# Patient Record
Sex: Male | Born: 1935 | Race: White | Hispanic: No | Marital: Married | Smoking: Current every day smoker
Health system: Southern US, Community
[De-identification: ages and names within clinical notes are randomized; demographics above are authoritative.]

## PROBLEM LIST (undated history)

## (undated) DIAGNOSIS — Z7901 Long term (current) use of anticoagulants: Secondary | ICD-10-CM

## (undated) DIAGNOSIS — E785 Hyperlipidemia, unspecified: Secondary | ICD-10-CM

## (undated) DIAGNOSIS — I10 Essential (primary) hypertension: Secondary | ICD-10-CM

## (undated) DIAGNOSIS — I340 Nonrheumatic mitral (valve) insufficiency: Secondary | ICD-10-CM

## (undated) DIAGNOSIS — F1010 Alcohol abuse, uncomplicated: Secondary | ICD-10-CM

## (undated) DIAGNOSIS — E291 Testicular hypofunction: Secondary | ICD-10-CM

## (undated) DIAGNOSIS — E119 Type 2 diabetes mellitus without complications: Secondary | ICD-10-CM

## (undated) DIAGNOSIS — S42401A Unspecified fracture of lower end of right humerus, initial encounter for closed fracture: Secondary | ICD-10-CM

## (undated) DIAGNOSIS — G459 Transient cerebral ischemic attack, unspecified: Secondary | ICD-10-CM

## (undated) DIAGNOSIS — I428 Other cardiomyopathies: Secondary | ICD-10-CM

## (undated) DIAGNOSIS — Z87891 Personal history of nicotine dependence: Secondary | ICD-10-CM

## (undated) DIAGNOSIS — I2510 Atherosclerotic heart disease of native coronary artery without angina pectoris: Secondary | ICD-10-CM

## (undated) DIAGNOSIS — Z8719 Personal history of other diseases of the digestive system: Secondary | ICD-10-CM

## (undated) DIAGNOSIS — I5020 Unspecified systolic (congestive) heart failure: Secondary | ICD-10-CM

## (undated) DIAGNOSIS — Z86010 Personal history of colonic polyps: Secondary | ICD-10-CM

## (undated) DIAGNOSIS — I4821 Permanent atrial fibrillation: Secondary | ICD-10-CM

## (undated) DIAGNOSIS — M25561 Pain in right knee: Secondary | ICD-10-CM

## (undated) HISTORY — DX: Essential (primary) hypertension: I10

## (undated) HISTORY — DX: Other cardiomyopathies: I42.8

## (undated) HISTORY — DX: Transient cerebral ischemic attack, unspecified: G45.9

## (undated) HISTORY — DX: Alcohol abuse, uncomplicated: F10.10

## (undated) HISTORY — DX: Long term (current) use of anticoagulants: Z79.01

## (undated) HISTORY — DX: Personal history of other diseases of the digestive system: Z87.19

## (undated) HISTORY — DX: Personal history of nicotine dependence: Z87.891

## (undated) HISTORY — PX: TONSILLECTOMY: SUR1361

## (undated) HISTORY — DX: Pain in right knee: M25.561

## (undated) HISTORY — DX: Unspecified systolic (congestive) heart failure: I50.20

## (undated) HISTORY — DX: Atherosclerotic heart disease of native coronary artery without angina pectoris: I25.10

## (undated) HISTORY — DX: Permanent atrial fibrillation: I48.21

## (undated) HISTORY — DX: Unspecified fracture of lower end of right humerus, initial encounter for closed fracture: S42.401A

## (undated) HISTORY — DX: Personal history of colonic polyps: Z86.010

## (undated) HISTORY — DX: Nonrheumatic mitral (valve) insufficiency: I34.0

## (undated) HISTORY — DX: Hyperlipidemia, unspecified: E78.5

## (undated) HISTORY — DX: Testicular hypofunction: E29.1

## (undated) HISTORY — DX: Type 2 diabetes mellitus without complications: E11.9

## (undated) HISTORY — PX: APPENDECTOMY: SHX54

---

## 1992-09-21 DIAGNOSIS — Z8639 Personal history of other endocrine, nutritional and metabolic disease: Secondary | ICD-10-CM | POA: Insufficient documentation

## 1992-09-21 DIAGNOSIS — E119 Type 2 diabetes mellitus without complications: Secondary | ICD-10-CM

## 1992-09-21 HISTORY — DX: Type 2 diabetes mellitus without complications: E11.9

## 1993-09-21 HISTORY — PX: CARDIAC CATHETERIZATION: SHX172

## 2001-05-22 DIAGNOSIS — E785 Hyperlipidemia, unspecified: Secondary | ICD-10-CM | POA: Insufficient documentation

## 2001-05-22 HISTORY — DX: Hyperlipidemia, unspecified: E78.5

## 2002-06-21 ENCOUNTER — Encounter: Payer: Self-pay | Admitting: Family Medicine

## 2002-06-21 LAB — CONVERTED CEMR LAB
Microalbumin U total vol: 6.2 mg/L
PSA: 0.36 ng/mL

## 2002-09-21 ENCOUNTER — Encounter: Payer: Self-pay | Admitting: Family Medicine

## 2002-09-21 LAB — CONVERTED CEMR LAB: Hgb A1c MFr Bld: 7.2 %

## 2002-10-26 DIAGNOSIS — Z860100 Personal history of colon polyps, unspecified: Secondary | ICD-10-CM

## 2002-10-26 HISTORY — DX: Personal history of colon polyps, unspecified: Z86.0100

## 2002-10-26 HISTORY — DX: Personal history of colonic polyps: Z86.010

## 2003-01-20 DIAGNOSIS — I482 Chronic atrial fibrillation, unspecified: Secondary | ICD-10-CM | POA: Insufficient documentation

## 2003-01-20 DIAGNOSIS — I4821 Permanent atrial fibrillation: Secondary | ICD-10-CM

## 2003-01-20 HISTORY — DX: Permanent atrial fibrillation: I48.21

## 2003-02-20 ENCOUNTER — Encounter: Payer: Self-pay | Admitting: Family Medicine

## 2003-02-20 LAB — CONVERTED CEMR LAB
Hgb A1c MFr Bld: 7.1 %
Microalbumin U total vol: 45.4 mg/L

## 2003-04-22 DIAGNOSIS — M109 Gout, unspecified: Secondary | ICD-10-CM | POA: Insufficient documentation

## 2003-05-23 ENCOUNTER — Encounter: Payer: Self-pay | Admitting: Family Medicine

## 2003-05-23 LAB — CONVERTED CEMR LAB: Hgb A1c MFr Bld: 5.9 %

## 2003-10-23 ENCOUNTER — Encounter: Payer: Self-pay | Admitting: Family Medicine

## 2003-10-23 LAB — CONVERTED CEMR LAB: Hgb A1c MFr Bld: 5.5 %

## 2004-02-20 ENCOUNTER — Encounter: Payer: Self-pay | Admitting: Family Medicine

## 2004-02-20 HISTORY — PX: BUNIONECTOMY: SHX129

## 2004-02-20 LAB — CONVERTED CEMR LAB
Hgb A1c MFr Bld: 5.6 %
Microalbumin U total vol: 52.8 mg/L
PSA: 0.4 ng/mL

## 2004-07-22 ENCOUNTER — Encounter: Payer: Self-pay | Admitting: Family Medicine

## 2004-07-22 LAB — CONVERTED CEMR LAB: Hgb A1c MFr Bld: 6 %

## 2004-07-25 ENCOUNTER — Ambulatory Visit: Payer: Self-pay | Admitting: Family Medicine

## 2004-08-04 ENCOUNTER — Ambulatory Visit: Payer: Self-pay | Admitting: Family Medicine

## 2004-08-05 ENCOUNTER — Ambulatory Visit: Payer: Self-pay | Admitting: Family Medicine

## 2004-08-25 ENCOUNTER — Ambulatory Visit: Payer: Self-pay | Admitting: Family Medicine

## 2004-09-11 ENCOUNTER — Ambulatory Visit: Payer: Self-pay | Admitting: Family Medicine

## 2004-10-09 ENCOUNTER — Ambulatory Visit: Payer: Self-pay | Admitting: Family Medicine

## 2004-11-06 ENCOUNTER — Ambulatory Visit: Payer: Self-pay | Admitting: Family Medicine

## 2004-11-13 ENCOUNTER — Ambulatory Visit: Payer: Self-pay | Admitting: Family Medicine

## 2004-11-27 ENCOUNTER — Ambulatory Visit: Payer: Self-pay | Admitting: Family Medicine

## 2004-12-08 ENCOUNTER — Ambulatory Visit: Payer: Self-pay | Admitting: Family Medicine

## 2004-12-25 ENCOUNTER — Ambulatory Visit: Payer: Self-pay | Admitting: Family Medicine

## 2005-01-22 ENCOUNTER — Ambulatory Visit: Payer: Self-pay | Admitting: Family Medicine

## 2005-02-19 ENCOUNTER — Ambulatory Visit: Payer: Self-pay | Admitting: Family Medicine

## 2005-02-19 LAB — CONVERTED CEMR LAB: Hgb A1c MFr Bld: 5.5 %

## 2005-03-06 ENCOUNTER — Ambulatory Visit: Payer: Self-pay | Admitting: Family Medicine

## 2005-03-10 ENCOUNTER — Ambulatory Visit: Payer: Self-pay | Admitting: Family Medicine

## 2005-04-06 ENCOUNTER — Ambulatory Visit: Payer: Self-pay | Admitting: Family Medicine

## 2005-04-23 ENCOUNTER — Ambulatory Visit: Payer: Self-pay | Admitting: Family Medicine

## 2005-05-06 ENCOUNTER — Ambulatory Visit: Payer: Self-pay | Admitting: Family Medicine

## 2005-05-19 ENCOUNTER — Ambulatory Visit: Payer: Self-pay | Admitting: Family Medicine

## 2005-05-22 ENCOUNTER — Encounter: Payer: Self-pay | Admitting: Family Medicine

## 2005-05-22 LAB — CONVERTED CEMR LAB
Hgb A1c MFr Bld: 6 %
Microalbumin U total vol: 28.8 mg/L
PSA: 0.66 ng/mL

## 2005-05-26 ENCOUNTER — Ambulatory Visit: Payer: Self-pay | Admitting: Family Medicine

## 2005-05-29 ENCOUNTER — Ambulatory Visit: Payer: Self-pay | Admitting: Family Medicine

## 2005-06-08 ENCOUNTER — Ambulatory Visit: Payer: Self-pay | Admitting: Family Medicine

## 2005-06-15 ENCOUNTER — Ambulatory Visit: Payer: Self-pay | Admitting: Family Medicine

## 2005-06-17 ENCOUNTER — Ambulatory Visit: Payer: Self-pay | Admitting: Family Medicine

## 2005-06-29 ENCOUNTER — Ambulatory Visit: Payer: Self-pay | Admitting: Family Medicine

## 2005-07-07 ENCOUNTER — Ambulatory Visit: Payer: Self-pay | Admitting: Family Medicine

## 2005-07-13 ENCOUNTER — Ambulatory Visit: Payer: Self-pay | Admitting: Family Medicine

## 2005-07-27 ENCOUNTER — Ambulatory Visit: Payer: Self-pay | Admitting: Family Medicine

## 2005-08-04 ENCOUNTER — Ambulatory Visit: Payer: Self-pay | Admitting: Family Medicine

## 2005-08-18 ENCOUNTER — Ambulatory Visit: Payer: Self-pay | Admitting: Family Medicine

## 2005-09-16 ENCOUNTER — Ambulatory Visit: Payer: Self-pay | Admitting: Family Medicine

## 2005-10-14 ENCOUNTER — Ambulatory Visit: Payer: Self-pay | Admitting: Family Medicine

## 2005-11-11 ENCOUNTER — Ambulatory Visit: Payer: Self-pay | Admitting: Family Medicine

## 2005-12-09 ENCOUNTER — Ambulatory Visit: Payer: Self-pay | Admitting: Family Medicine

## 2005-12-15 ENCOUNTER — Ambulatory Visit: Payer: Self-pay | Admitting: Family Medicine

## 2005-12-23 ENCOUNTER — Ambulatory Visit: Payer: Self-pay | Admitting: Family Medicine

## 2006-01-13 ENCOUNTER — Ambulatory Visit: Payer: Self-pay | Admitting: Family Medicine

## 2006-01-29 ENCOUNTER — Ambulatory Visit: Payer: Self-pay | Admitting: Family Medicine

## 2006-02-11 ENCOUNTER — Ambulatory Visit: Payer: Self-pay | Admitting: Family Medicine

## 2006-03-03 ENCOUNTER — Ambulatory Visit: Payer: Self-pay | Admitting: Family Medicine

## 2006-03-17 ENCOUNTER — Ambulatory Visit: Payer: Self-pay | Admitting: Family Medicine

## 2006-04-06 ENCOUNTER — Ambulatory Visit: Payer: Self-pay | Admitting: Family Medicine

## 2006-04-19 ENCOUNTER — Ambulatory Visit: Payer: Self-pay | Admitting: Family Medicine

## 2006-05-17 ENCOUNTER — Ambulatory Visit: Payer: Self-pay | Admitting: Family Medicine

## 2006-06-14 ENCOUNTER — Ambulatory Visit: Payer: Self-pay | Admitting: Family Medicine

## 2006-06-21 ENCOUNTER — Encounter: Payer: Self-pay | Admitting: Family Medicine

## 2006-06-21 LAB — CONVERTED CEMR LAB
Microalbumin U total vol: 83.3 mg/L
PSA: 0.55 ng/mL

## 2006-07-05 ENCOUNTER — Ambulatory Visit: Payer: Self-pay | Admitting: Family Medicine

## 2006-07-06 ENCOUNTER — Ambulatory Visit: Payer: Self-pay | Admitting: Family Medicine

## 2006-07-13 ENCOUNTER — Ambulatory Visit: Payer: Self-pay | Admitting: Family Medicine

## 2006-08-16 ENCOUNTER — Ambulatory Visit: Payer: Self-pay | Admitting: Family Medicine

## 2006-08-17 ENCOUNTER — Ambulatory Visit: Payer: Self-pay | Admitting: Family Medicine

## 2006-08-23 ENCOUNTER — Ambulatory Visit: Payer: Self-pay | Admitting: Family Medicine

## 2006-09-10 ENCOUNTER — Ambulatory Visit: Payer: Self-pay | Admitting: Family Medicine

## 2006-09-21 ENCOUNTER — Encounter: Payer: Self-pay | Admitting: Family Medicine

## 2006-09-21 LAB — CONVERTED CEMR LAB: Hgb A1c MFr Bld: 6.5 %

## 2006-10-06 ENCOUNTER — Ambulatory Visit: Payer: Self-pay | Admitting: Family Medicine

## 2006-10-06 LAB — CONVERTED CEMR LAB
Hgb A1c MFr Bld: 6.5 % — ABNORMAL HIGH (ref 4.6–6.0)
Potassium: 5.1 meq/L (ref 3.5–5.1)

## 2006-10-18 ENCOUNTER — Ambulatory Visit: Payer: Self-pay | Admitting: Family Medicine

## 2006-11-15 ENCOUNTER — Ambulatory Visit: Payer: Self-pay | Admitting: Family Medicine

## 2006-12-13 ENCOUNTER — Ambulatory Visit: Payer: Self-pay | Admitting: Family Medicine

## 2007-01-10 ENCOUNTER — Ambulatory Visit: Payer: Self-pay | Admitting: Family Medicine

## 2007-01-10 LAB — CONVERTED CEMR LAB
ALT: 17 units/L (ref 0–40)
AST: 20 units/L (ref 0–37)
Albumin: 3.5 g/dL (ref 3.5–5.2)
Alkaline Phosphatase: 31 units/L — ABNORMAL LOW (ref 39–117)
BUN: 18 mg/dL (ref 6–23)
Basophils Absolute: 0.1 10*3/uL (ref 0.0–0.1)
Basophils Relative: 2.3 % — ABNORMAL HIGH (ref 0.0–1.0)
Bilirubin, Direct: 0.1 mg/dL (ref 0.0–0.3)
CO2: 27 meq/L (ref 19–32)
Calcium: 8.8 mg/dL (ref 8.4–10.5)
Chloride: 109 meq/L (ref 96–112)
Cholesterol: 97 mg/dL (ref 0–200)
Creatinine, Ser: 1.2 mg/dL (ref 0.4–1.5)
Creatinine,U: 305.8 mg/dL
Eosinophils Absolute: 0.1 10*3/uL (ref 0.0–0.6)
Eosinophils Relative: 2.7 % (ref 0.0–5.0)
GFR calc Af Amer: 77 mL/min
GFR calc non Af Amer: 64 mL/min
Glucose, Bld: 117 mg/dL — ABNORMAL HIGH (ref 70–99)
HCT: 37.8 % — ABNORMAL LOW (ref 39.0–52.0)
HDL: 39.5 mg/dL (ref >39.0)
Hemoglobin: 12.9 g/dL — ABNORMAL LOW (ref 13.0–17.0)
Hgb A1c MFr Bld: 6.2 % — ABNORMAL HIGH (ref 4.6–6.0)
LDL Cholesterol: 42 mg/dL (ref 0–99)
Lymphocytes Relative: 23.7 % (ref 12.0–46.0)
MCHC: 34.1 g/dL (ref 30.0–36.0)
MCV: 91.1 fL (ref 78.0–100.0)
Microalb Creat Ratio: 49.4 mg/g — ABNORMAL HIGH (ref 0.0–30.0)
Microalb, Ur: 15.1 mg/dL — ABNORMAL HIGH (ref 0.0–1.9)
Monocytes Absolute: 0.4 10*3/uL (ref 0.2–0.7)
Monocytes Relative: 9.1 % (ref 3.0–11.0)
Neutro Abs: 2.8 10*3/uL (ref 1.4–7.7)
Neutrophils Relative %: 62.2 % (ref 43.0–77.0)
PSA: 0.54 ng/mL (ref 0.10–4.00)
Platelets: 207 10*3/uL (ref 150–400)
Potassium: 5.6 meq/L — ABNORMAL HIGH (ref 3.5–5.1)
RBC: 4.14 M/uL — ABNORMAL LOW (ref 4.22–5.81)
RDW: 15.5 % — ABNORMAL HIGH (ref 11.5–14.6)
Sodium: 143 meq/L (ref 135–145)
TSH: 2.19 microintl units/mL (ref 0.35–5.50)
Total Bilirubin: 0.7 mg/dL (ref 0.3–1.2)
Total CHOL/HDL Ratio: 2.5
Total Protein: 5.8 g/dL — ABNORMAL LOW (ref 6.0–8.3)
Triglycerides: 78 mg/dL (ref 0–149)
Uric Acid, Serum: 9.1 mg/dL — ABNORMAL HIGH (ref 2.4–7.0)
VLDL: 16 mg/dL (ref 0–40)
WBC: 4.5 10*3/uL (ref 4.5–10.5)

## 2007-01-11 ENCOUNTER — Encounter: Payer: Self-pay | Admitting: Family Medicine

## 2007-01-11 DIAGNOSIS — I471 Supraventricular tachycardia, unspecified: Secondary | ICD-10-CM | POA: Insufficient documentation

## 2007-01-12 ENCOUNTER — Ambulatory Visit: Payer: Self-pay | Admitting: Family Medicine

## 2007-02-07 ENCOUNTER — Ambulatory Visit: Payer: Self-pay | Admitting: Family Medicine

## 2007-02-07 LAB — CONVERTED CEMR LAB
INR: 2.2
Prothrombin Time: 18.3 s

## 2007-03-07 ENCOUNTER — Ambulatory Visit: Payer: Self-pay | Admitting: Family Medicine

## 2007-03-07 LAB — CONVERTED CEMR LAB
INR: 2.7
Prothrombin Time: 20.1 s

## 2007-04-04 ENCOUNTER — Ambulatory Visit: Payer: Self-pay | Admitting: Family Medicine

## 2007-04-04 LAB — CONVERTED CEMR LAB
INR: 4.1
Prothrombin Time: 24.4 s

## 2007-04-18 ENCOUNTER — Ambulatory Visit: Payer: Self-pay | Admitting: Family Medicine

## 2007-04-18 LAB — CONVERTED CEMR LAB
INR: 2.5
Prothrombin Time: 19.2 s

## 2007-05-16 ENCOUNTER — Ambulatory Visit: Payer: Self-pay | Admitting: Family Medicine

## 2007-05-16 LAB — CONVERTED CEMR LAB
INR: 2.7
Prothrombin Time: 19.9 s

## 2007-06-13 ENCOUNTER — Encounter (INDEPENDENT_AMBULATORY_CARE_PROVIDER_SITE_OTHER): Payer: Self-pay | Admitting: *Deleted

## 2007-06-13 ENCOUNTER — Ambulatory Visit: Payer: Self-pay | Admitting: Family Medicine

## 2007-06-13 LAB — CONVERTED CEMR LAB
INR: 1.3
Prothrombin Time: 14.1 s

## 2007-06-22 ENCOUNTER — Ambulatory Visit: Payer: Self-pay | Admitting: Family Medicine

## 2007-06-23 ENCOUNTER — Ambulatory Visit: Payer: Self-pay | Admitting: Family Medicine

## 2007-06-23 LAB — CONVERTED CEMR LAB
INR: 1.7
Prothrombin Time: 16.1 s

## 2007-07-11 ENCOUNTER — Ambulatory Visit: Payer: Self-pay | Admitting: Family Medicine

## 2007-07-11 LAB — CONVERTED CEMR LAB
ALT: 17 units/L (ref 0–53)
AST: 22 units/L (ref 0–37)
Albumin: 3.6 g/dL (ref 3.5–5.2)
Alkaline Phosphatase: 34 units/L — ABNORMAL LOW (ref 39–117)
BUN: 22 mg/dL (ref 6–23)
Basophils Absolute: 0 10*3/uL (ref 0.0–0.1)
Basophils Relative: 0.6 % (ref 0.0–1.0)
Bilirubin, Direct: 0.1 mg/dL (ref 0.0–0.3)
CO2: 30 meq/L (ref 19–32)
Calcium: 9.1 mg/dL (ref 8.4–10.5)
Chloride: 109 meq/L (ref 96–112)
Cholesterol: 94 mg/dL (ref 0–200)
Creatinine, Ser: 1.3 mg/dL (ref 0.4–1.5)
Creatinine,U: 141.9 mg/dL
Eosinophils Absolute: 0.1 10*3/uL (ref 0.0–0.6)
Eosinophils Relative: 1.7 % (ref 0.0–5.0)
GFR calc Af Amer: 70 mL/min
GFR calc non Af Amer: 58 mL/min
Glucose, Bld: 108 mg/dL — ABNORMAL HIGH (ref 70–99)
HCT: 38.5 % — ABNORMAL LOW (ref 39.0–52.0)
HDL: 39.2 mg/dL (ref >39.0)
Hemoglobin: 12.9 g/dL — ABNORMAL LOW (ref 13.0–17.0)
Hgb A1c MFr Bld: 6.1 % — ABNORMAL HIGH (ref 4.6–6.0)
INR: 1.7
LDL Cholesterol: 44 mg/dL (ref 0–99)
Lymphocytes Relative: 27.2 % (ref 12.0–46.0)
MCHC: 33.6 g/dL (ref 30.0–36.0)
MCV: 88.2 fL (ref 78.0–100.0)
Microalb Creat Ratio: 121.9 mg/g — ABNORMAL HIGH (ref 0.0–30.0)
Microalb, Ur: 17.3 mg/dL — ABNORMAL HIGH (ref 0.0–1.9)
Monocytes Absolute: 0.4 10*3/uL (ref 0.2–0.7)
Monocytes Relative: 10.3 % (ref 3.0–11.0)
Neutro Abs: 2.4 10*3/uL (ref 1.4–7.7)
Neutrophils Relative %: 60.2 % (ref 43.0–77.0)
Platelets: 239 10*3/uL (ref 150–400)
Potassium: 5.3 meq/L — ABNORMAL HIGH (ref 3.5–5.1)
Prothrombin Time: 16.1 s
RBC: 4.37 M/uL (ref 4.22–5.81)
RDW: 15.6 % — ABNORMAL HIGH (ref 11.5–14.6)
Sodium: 143 meq/L (ref 135–145)
TSH: 1.77 microintl units/mL (ref 0.35–5.50)
Total Bilirubin: 0.7 mg/dL (ref 0.3–1.2)
Total CHOL/HDL Ratio: 2.4
Total Protein: 6.1 g/dL (ref 6.0–8.3)
Triglycerides: 55 mg/dL (ref 0–149)
Uric Acid, Serum: 8.2 mg/dL — ABNORMAL HIGH (ref 2.4–7.0)
VLDL: 11 mg/dL (ref 0–40)
WBC: 4 10*3/uL — ABNORMAL LOW (ref 4.5–10.5)

## 2007-07-14 ENCOUNTER — Ambulatory Visit: Payer: Self-pay | Admitting: Family Medicine

## 2007-08-05 ENCOUNTER — Ambulatory Visit: Payer: Self-pay | Admitting: Family Medicine

## 2007-08-09 ENCOUNTER — Ambulatory Visit: Payer: Self-pay | Admitting: Family Medicine

## 2007-08-09 LAB — CONVERTED CEMR LAB
INR: 1.9
Prothrombin Time: 16.8 s

## 2007-09-06 ENCOUNTER — Ambulatory Visit: Payer: Self-pay | Admitting: Family Medicine

## 2007-09-06 LAB — CONVERTED CEMR LAB
INR: 3.9
Prothrombin Time: 23.9 s

## 2007-09-20 ENCOUNTER — Ambulatory Visit: Payer: Self-pay | Admitting: Family Medicine

## 2007-09-20 LAB — CONVERTED CEMR LAB
INR: 1
Prothrombin Time: 12.6 s

## 2007-10-05 ENCOUNTER — Ambulatory Visit: Payer: Self-pay | Admitting: Family Medicine

## 2007-10-05 LAB — CONVERTED CEMR LAB
INR: 1.5
INR: 1.5 — ABNORMAL HIGH (ref 0.8–1.0)
Prothrombin Time: 14.8 s
Prothrombin Time: 14.8 s — ABNORMAL HIGH (ref 10.9–13.3)

## 2007-10-20 ENCOUNTER — Ambulatory Visit: Payer: Self-pay | Admitting: Family Medicine

## 2007-10-20 LAB — CONVERTED CEMR LAB
INR: 4.2
Prothrombin Time: 24.8 s

## 2007-11-03 ENCOUNTER — Ambulatory Visit: Payer: Self-pay | Admitting: Family Medicine

## 2007-11-03 LAB — CONVERTED CEMR LAB
INR: 2.4
Prothrombin Time: 19 s

## 2007-11-11 ENCOUNTER — Ambulatory Visit: Payer: Self-pay | Admitting: Ophthalmology

## 2007-11-16 ENCOUNTER — Ambulatory Visit: Payer: Self-pay | Admitting: Family Medicine

## 2007-11-16 LAB — CONVERTED CEMR LAB
INR: 2.6
Prothrombin Time: 19.5 s

## 2007-11-24 ENCOUNTER — Telehealth: Payer: Self-pay | Admitting: Family Medicine

## 2007-11-25 ENCOUNTER — Encounter: Payer: Self-pay | Admitting: Family Medicine

## 2007-12-05 ENCOUNTER — Telehealth: Payer: Self-pay | Admitting: Family Medicine

## 2007-12-06 ENCOUNTER — Encounter: Payer: Self-pay | Admitting: Family Medicine

## 2007-12-06 ENCOUNTER — Ambulatory Visit: Payer: Self-pay | Admitting: Unknown Physician Specialty

## 2007-12-06 LAB — HM COLONOSCOPY

## 2007-12-07 ENCOUNTER — Telehealth: Payer: Self-pay | Admitting: Family Medicine

## 2007-12-12 ENCOUNTER — Encounter: Payer: Self-pay | Admitting: Family Medicine

## 2007-12-13 ENCOUNTER — Ambulatory Visit: Payer: Self-pay | Admitting: Family Medicine

## 2007-12-14 LAB — CONVERTED CEMR LAB
INR: 2 — ABNORMAL HIGH (ref 0.8–1.0)
Prothrombin Time: 17.4 s — ABNORMAL HIGH (ref 10.9–13.3)

## 2007-12-28 ENCOUNTER — Ambulatory Visit: Payer: Self-pay | Admitting: Family Medicine

## 2007-12-28 LAB — CONVERTED CEMR LAB
INR: 2.6
Prothrombin Time: 19.5 s

## 2008-01-05 ENCOUNTER — Ambulatory Visit: Payer: Self-pay | Admitting: Family Medicine

## 2008-01-05 LAB — CONVERTED CEMR LAB: Hgb A1c MFr Bld: 6.2 % — ABNORMAL HIGH (ref 4.6–6.0)

## 2008-01-09 ENCOUNTER — Ambulatory Visit: Payer: Self-pay | Admitting: Family Medicine

## 2008-01-09 DIAGNOSIS — H04129 Dry eye syndrome of unspecified lacrimal gland: Secondary | ICD-10-CM | POA: Insufficient documentation

## 2008-01-25 ENCOUNTER — Ambulatory Visit: Payer: Self-pay | Admitting: Family Medicine

## 2008-01-25 LAB — CONVERTED CEMR LAB
INR: 2.1
Prothrombin Time: 17.7 s

## 2008-02-22 ENCOUNTER — Ambulatory Visit: Payer: Self-pay | Admitting: Family Medicine

## 2008-02-22 LAB — CONVERTED CEMR LAB
BUN: 13 mg/dL (ref 6–23)
CO2: 30 meq/L (ref 19–32)
Calcium: 8.4 mg/dL (ref 8.4–10.5)
Chloride: 110 meq/L (ref 96–112)
Creatinine, Ser: 1.2 mg/dL (ref 0.4–1.5)
GFR calc Af Amer: 77 mL/min
GFR calc non Af Amer: 64 mL/min
Glucose, Bld: 133 mg/dL — ABNORMAL HIGH (ref 70–99)
Hgb A1c MFr Bld: 6.4 % — ABNORMAL HIGH (ref 4.6–6.0)
INR: 1.5
Potassium: 4.6 meq/L (ref 3.5–5.1)
Prothrombin Time: 15.2 s
Sodium: 135 meq/L (ref 135–145)

## 2008-02-27 ENCOUNTER — Ambulatory Visit: Payer: Self-pay | Admitting: Family Medicine

## 2008-03-07 ENCOUNTER — Ambulatory Visit: Payer: Self-pay | Admitting: Family Medicine

## 2008-03-07 LAB — CONVERTED CEMR LAB
INR: 1.8
Prothrombin Time: 16.4 s

## 2008-04-04 ENCOUNTER — Ambulatory Visit: Payer: Self-pay | Admitting: Family Medicine

## 2008-04-04 LAB — CONVERTED CEMR LAB
INR: 2.5
Prothrombin Time: 19.2 s

## 2008-05-07 ENCOUNTER — Ambulatory Visit: Payer: Self-pay | Admitting: Family Medicine

## 2008-05-07 LAB — CONVERTED CEMR LAB
Hgb A1c MFr Bld: 6.9 % — ABNORMAL HIGH (ref 4.6–6.0)
INR: 3.2
Prothrombin Time: 21.5 s

## 2008-05-10 ENCOUNTER — Ambulatory Visit: Payer: Self-pay | Admitting: Family Medicine

## 2008-05-21 ENCOUNTER — Ambulatory Visit: Payer: Self-pay | Admitting: Family Medicine

## 2008-05-21 LAB — CONVERTED CEMR LAB
INR: 1.8
Prothrombin Time: 16.6 s

## 2008-05-30 ENCOUNTER — Encounter: Payer: Self-pay | Admitting: Family Medicine

## 2008-06-04 ENCOUNTER — Ambulatory Visit: Payer: Self-pay | Admitting: Family Medicine

## 2008-06-04 LAB — CONVERTED CEMR LAB
INR: 1.5
Prothrombin Time: 14.9 s

## 2008-06-06 ENCOUNTER — Ambulatory Visit: Payer: Self-pay | Admitting: Family Medicine

## 2008-06-18 ENCOUNTER — Ambulatory Visit: Payer: Self-pay | Admitting: Family Medicine

## 2008-06-18 LAB — CONVERTED CEMR LAB
INR: 1.9
Prothrombin Time: 17.1 s

## 2008-06-20 ENCOUNTER — Ambulatory Visit: Payer: Self-pay | Admitting: Family Medicine

## 2008-07-05 ENCOUNTER — Ambulatory Visit: Payer: Self-pay | Admitting: Family Medicine

## 2008-07-05 LAB — CONVERTED CEMR LAB
ALT: 27 units/L (ref 0–53)
AST: 26 units/L (ref 0–37)
Albumin: 3.5 g/dL (ref 3.5–5.2)
Alkaline Phosphatase: 43 units/L (ref 39–117)
BUN: 24 mg/dL — ABNORMAL HIGH (ref 6–23)
Basophils Absolute: 0 10*3/uL (ref 0.0–0.1)
Basophils Relative: 0.8 % (ref 0.0–3.0)
Bilirubin, Direct: 0.2 mg/dL (ref 0.0–0.3)
CO2: 28 meq/L (ref 19–32)
Calcium: 9 mg/dL (ref 8.4–10.5)
Chloride: 108 meq/L (ref 96–112)
Cholesterol: 89 mg/dL (ref 0–200)
Creatinine, Ser: 1.3 mg/dL (ref 0.4–1.5)
Creatinine,U: 175.5 mg/dL
Eosinophils Absolute: 0.1 10*3/uL (ref 0.0–0.7)
Eosinophils Relative: 2 % (ref 0.0–5.0)
GFR calc Af Amer: 70 mL/min
GFR calc non Af Amer: 58 mL/min
Glucose, Bld: 100 mg/dL — ABNORMAL HIGH (ref 70–99)
HCT: 38.2 % — ABNORMAL LOW (ref 39.0–52.0)
HDL: 32 mg/dL — ABNORMAL LOW (ref >39.0)
Hemoglobin: 12.8 g/dL — ABNORMAL LOW (ref 13.0–17.0)
Hgb A1c MFr Bld: 6.5 % — ABNORMAL HIGH (ref 4.6–6.0)
INR: 2.3 — ABNORMAL HIGH (ref 0.8–1.0)
LDL Cholesterol: 42 mg/dL (ref 0–99)
Lymphocytes Relative: 36.2 % (ref 12.0–46.0)
MCHC: 33.5 g/dL (ref 30.0–36.0)
MCV: 86.9 fL (ref 78.0–100.0)
Microalb Creat Ratio: 62.1 mg/g — ABNORMAL HIGH (ref 0.0–30.0)
Microalb, Ur: 10.9 mg/dL — ABNORMAL HIGH (ref 0.0–1.9)
Monocytes Absolute: 0.3 10*3/uL (ref 0.1–1.0)
Monocytes Relative: 8.5 % (ref 3.0–12.0)
Neutro Abs: 2 10*3/uL (ref 1.4–7.7)
Neutrophils Relative %: 52.5 % (ref 43.0–77.0)
PSA: 0.33 ng/mL (ref 0.10–4.00)
Platelets: 187 10*3/uL (ref 150–400)
Potassium: 4.9 meq/L (ref 3.5–5.1)
Prothrombin Time: 25.1 s — ABNORMAL HIGH (ref 10.9–13.3)
RBC: 4.4 M/uL (ref 4.22–5.81)
RDW: 16.9 % — ABNORMAL HIGH (ref 11.5–14.6)
Sodium: 141 meq/L (ref 135–145)
TSH: 1.86 microintl units/mL (ref 0.35–5.50)
Total Bilirubin: 0.8 mg/dL (ref 0.3–1.2)
Total CHOL/HDL Ratio: 2.8
Total Protein: 6 g/dL (ref 6.0–8.3)
Triglycerides: 74 mg/dL (ref 0–149)
Uric Acid, Serum: 9.8 mg/dL — ABNORMAL HIGH (ref 4.0–7.8)
VLDL: 15 mg/dL (ref 0–40)
WBC: 3.7 10*3/uL — ABNORMAL LOW (ref 4.5–10.5)

## 2008-07-11 ENCOUNTER — Encounter (INDEPENDENT_AMBULATORY_CARE_PROVIDER_SITE_OTHER): Payer: Self-pay | Admitting: *Deleted

## 2008-07-11 ENCOUNTER — Ambulatory Visit: Payer: Self-pay | Admitting: Family Medicine

## 2008-07-27 ENCOUNTER — Ambulatory Visit: Payer: Self-pay | Admitting: Family Medicine

## 2008-07-27 LAB — CONVERTED CEMR LAB
OCCULT 1: NEGATIVE
OCCULT 2: NEGATIVE
OCCULT 3: NEGATIVE

## 2008-07-27 LAB — FECAL OCCULT BLOOD, GUAIAC: Fecal Occult Blood: NEGATIVE

## 2008-07-30 ENCOUNTER — Encounter (INDEPENDENT_AMBULATORY_CARE_PROVIDER_SITE_OTHER): Payer: Self-pay | Admitting: *Deleted

## 2008-08-13 ENCOUNTER — Ambulatory Visit: Payer: Self-pay | Admitting: Family Medicine

## 2008-08-13 LAB — CONVERTED CEMR LAB
ALT: 35 units/L (ref 0–53)
AST: 32 units/L (ref 0–37)
BUN: 21 mg/dL (ref 6–23)
CO2: 26 meq/L (ref 19–32)
Calcium: 9.1 mg/dL (ref 8.4–10.5)
Chloride: 107 meq/L (ref 96–112)
Creatinine, Ser: 1.4 mg/dL (ref 0.4–1.5)
GFR calc Af Amer: 64 mL/min
GFR calc non Af Amer: 53 mL/min
Glucose, Bld: 112 mg/dL — ABNORMAL HIGH (ref 70–99)
INR: 1.6
Potassium: 5.1 meq/L (ref 3.5–5.1)
Prothrombin Time: 15.4 s
Sodium: 139 meq/L (ref 135–145)

## 2008-09-07 ENCOUNTER — Ambulatory Visit: Payer: Self-pay | Admitting: Family Medicine

## 2008-09-07 LAB — CONVERTED CEMR LAB
ALT: 27 units/L (ref 0–53)
AST: 25 units/L (ref 0–37)
BUN: 24 mg/dL — ABNORMAL HIGH (ref 6–23)
CO2: 27 meq/L (ref 19–32)
Calcium: 9 mg/dL (ref 8.4–10.5)
Chloride: 107 meq/L (ref 96–112)
Creatinine, Ser: 1.3 mg/dL (ref 0.4–1.5)
GFR calc Af Amer: 70 mL/min
GFR calc non Af Amer: 58 mL/min
Glucose, Bld: 125 mg/dL — ABNORMAL HIGH (ref 70–99)
INR: 1.2
Potassium: 4.5 meq/L (ref 3.5–5.1)
Prothrombin Time: 13.8 s
Sodium: 139 meq/L (ref 135–145)
Uric Acid, Serum: 6 mg/dL (ref 4.0–7.8)

## 2008-09-19 ENCOUNTER — Ambulatory Visit: Payer: Self-pay | Admitting: Family Medicine

## 2008-09-19 LAB — CONVERTED CEMR LAB
INR: 2
Prothrombin Time: 17.3 s

## 2008-09-24 ENCOUNTER — Telehealth: Payer: Self-pay | Admitting: Family Medicine

## 2008-10-03 ENCOUNTER — Ambulatory Visit: Payer: Self-pay | Admitting: Family Medicine

## 2008-10-03 LAB — CONVERTED CEMR LAB
INR: 3.6
Prothrombin Time: 23 s

## 2008-10-31 ENCOUNTER — Ambulatory Visit: Payer: Self-pay | Admitting: Family Medicine

## 2008-10-31 LAB — CONVERTED CEMR LAB
INR: 1.8
Prothrombin Time: 16.4 s

## 2008-11-28 ENCOUNTER — Ambulatory Visit: Payer: Self-pay | Admitting: Family Medicine

## 2008-11-28 LAB — CONVERTED CEMR LAB
INR: 3.8
Prothrombin Time: 23.4 s

## 2008-11-29 ENCOUNTER — Ambulatory Visit: Payer: Self-pay | Admitting: Family Medicine

## 2009-01-04 ENCOUNTER — Ambulatory Visit: Payer: Self-pay | Admitting: Family Medicine

## 2009-01-04 LAB — CONVERTED CEMR LAB
ALT: 81 units/L — ABNORMAL HIGH (ref 0–53)
AST: 62 units/L — ABNORMAL HIGH (ref 0–37)
BUN: 30 mg/dL — ABNORMAL HIGH (ref 6–23)
CO2: 26 meq/L (ref 19–32)
Calcium: 9.3 mg/dL (ref 8.4–10.5)
Chloride: 108 meq/L (ref 96–112)
Creatinine, Ser: 1.8 mg/dL — ABNORMAL HIGH (ref 0.4–1.5)
GFR calc non Af Amer: 39.76 mL/min (ref >60)
Glucose, Bld: 132 mg/dL — ABNORMAL HIGH (ref 70–99)
Hgb A1c MFr Bld: 5.9 % (ref 4.6–6.5)
INR: 1.9
Potassium: 5.6 meq/L — ABNORMAL HIGH (ref 3.5–5.1)
Prothrombin Time: 16.8 s
Sodium: 140 meq/L (ref 135–145)
Uric Acid, Serum: 5.1 mg/dL (ref 4.0–7.8)

## 2009-01-17 ENCOUNTER — Ambulatory Visit: Payer: Self-pay | Admitting: Family Medicine

## 2009-02-04 ENCOUNTER — Ambulatory Visit: Payer: Self-pay | Admitting: Family Medicine

## 2009-02-04 LAB — CONVERTED CEMR LAB
INR: 2
Prothrombin Time: 17.5 s

## 2009-02-21 ENCOUNTER — Ambulatory Visit: Payer: Self-pay | Admitting: Family Medicine

## 2009-02-21 LAB — CONVERTED CEMR LAB
ALT: 23 units/L (ref 0–53)
AST: 27 units/L (ref 0–37)
BUN: 32 mg/dL — ABNORMAL HIGH (ref 6–23)
CO2: 27 meq/L (ref 19–32)
Calcium: 8.7 mg/dL (ref 8.4–10.5)
Chloride: 107 meq/L (ref 96–112)
Creatinine, Ser: 1.8 mg/dL — ABNORMAL HIGH (ref 0.4–1.5)
GFR calc non Af Amer: 39.74 mL/min (ref >60)
Glucose, Bld: 91 mg/dL (ref 70–99)
Potassium: 5.1 meq/L (ref 3.5–5.1)
Sodium: 139 meq/L (ref 135–145)
Uric Acid, Serum: 4.8 mg/dL (ref 4.0–7.8)

## 2009-02-28 ENCOUNTER — Ambulatory Visit: Payer: Self-pay | Admitting: Family Medicine

## 2009-02-28 DIAGNOSIS — M79609 Pain in unspecified limb: Secondary | ICD-10-CM | POA: Insufficient documentation

## 2009-02-28 LAB — CONVERTED CEMR LAB
INR: 2
Prothrombin Time: 17.3 s

## 2009-03-05 LAB — HM DIABETES EYE EXAM: HM Diabetic Eye Exam: NORMAL

## 2009-03-28 ENCOUNTER — Ambulatory Visit: Payer: Self-pay | Admitting: Family Medicine

## 2009-03-28 LAB — CONVERTED CEMR LAB
INR: 2.2
Prothrombin Time: 18.2 s

## 2009-04-24 ENCOUNTER — Telehealth: Payer: Self-pay | Admitting: Family Medicine

## 2009-04-25 ENCOUNTER — Ambulatory Visit: Payer: Self-pay | Admitting: Family Medicine

## 2009-04-25 LAB — CONVERTED CEMR LAB
INR: 1.9
Prothrombin Time: 17 s

## 2009-05-10 ENCOUNTER — Ambulatory Visit: Payer: Self-pay | Admitting: Family Medicine

## 2009-05-10 LAB — CONVERTED CEMR LAB
INR: 1.5
Prothrombin Time: 14.9 s

## 2009-05-20 ENCOUNTER — Ambulatory Visit: Payer: Self-pay | Admitting: Family Medicine

## 2009-05-20 LAB — CONVERTED CEMR LAB
INR: 3.8
Prothrombin Time: 23.5 s

## 2009-06-04 ENCOUNTER — Ambulatory Visit: Payer: Self-pay | Admitting: Family Medicine

## 2009-06-04 LAB — CONVERTED CEMR LAB
INR: 1.7
Prothrombin Time: 15.9 s

## 2009-06-18 ENCOUNTER — Ambulatory Visit: Payer: Self-pay | Admitting: Family Medicine

## 2009-06-18 LAB — CONVERTED CEMR LAB
INR: 2.9
Prothrombin Time: 20.5 s

## 2009-06-21 ENCOUNTER — Ambulatory Visit: Payer: Self-pay | Admitting: Family Medicine

## 2009-06-23 LAB — CONVERTED CEMR LAB
ALT: 24 units/L (ref 0–53)
AST: 25 units/L (ref 0–37)
Albumin: 3.9 g/dL (ref 3.5–5.2)
Alkaline Phosphatase: 50 units/L (ref 39–117)
BUN: 25 mg/dL — ABNORMAL HIGH (ref 6–23)
Basophils Absolute: 0 10*3/uL (ref 0.0–0.1)
Basophils Relative: 0.4 % (ref 0.0–3.0)
Bilirubin, Direct: 0.1 mg/dL (ref 0.0–0.3)
CO2: 28 meq/L (ref 19–32)
Calcium: 9.1 mg/dL (ref 8.4–10.5)
Chloride: 110 meq/L (ref 96–112)
Cholesterol: 102 mg/dL (ref 0–200)
Creatinine, Ser: 1.4 mg/dL (ref 0.4–1.5)
Creatinine,U: 141 mg/dL
Eosinophils Absolute: 0.1 10*3/uL (ref 0.0–0.7)
Eosinophils Relative: 1.4 % (ref 0.0–5.0)
GFR calc non Af Amer: 53.07 mL/min (ref >60)
Glucose, Bld: 115 mg/dL — ABNORMAL HIGH (ref 70–99)
HCT: 40.5 % (ref 39.0–52.0)
HDL: 35.6 mg/dL — ABNORMAL LOW (ref >39.00)
Hemoglobin: 13.9 g/dL (ref 13.0–17.0)
Hgb A1c MFr Bld: 6.3 % (ref 4.6–6.5)
LDL Cholesterol: 47 mg/dL (ref 0–99)
Lymphocytes Relative: 23.8 % (ref 12.0–46.0)
Lymphs Abs: 1.1 10*3/uL (ref 0.7–4.0)
MCHC: 34.2 g/dL (ref 30.0–36.0)
MCV: 93.5 fL (ref 78.0–100.0)
Microalb Creat Ratio: 124.1 mg/g — ABNORMAL HIGH (ref 0.0–30.0)
Microalb, Ur: 17.5 mg/dL — ABNORMAL HIGH (ref 0.0–1.9)
Monocytes Absolute: 0.4 10*3/uL (ref 0.1–1.0)
Monocytes Relative: 7.7 % (ref 3.0–12.0)
Neutro Abs: 3.2 10*3/uL (ref 1.4–7.7)
Neutrophils Relative %: 66.7 % (ref 43.0–77.0)
PSA: 0.67 ng/mL (ref 0.10–4.00)
Platelets: 192 10*3/uL (ref 150.0–400.0)
Potassium: 4.7 meq/L (ref 3.5–5.1)
RBC: 4.33 M/uL (ref 4.22–5.81)
RDW: 16.7 % — ABNORMAL HIGH (ref 11.5–14.6)
Sodium: 141 meq/L (ref 135–145)
TSH: 1.52 microintl units/mL (ref 0.35–5.50)
Total Bilirubin: 0.8 mg/dL (ref 0.3–1.2)
Total CHOL/HDL Ratio: 3
Total Protein: 6.2 g/dL (ref 6.0–8.3)
Triglycerides: 97 mg/dL (ref 0.0–149.0)
Uric Acid, Serum: 5.2 mg/dL (ref 4.0–7.8)
VLDL: 19.4 mg/dL (ref 0.0–40.0)
Vit D, 25-Hydroxy: 46 ng/mL (ref 30–89)
WBC: 4.8 10*3/uL (ref 4.5–10.5)

## 2009-07-01 ENCOUNTER — Ambulatory Visit: Payer: Self-pay | Admitting: Family Medicine

## 2009-07-16 ENCOUNTER — Ambulatory Visit: Payer: Self-pay | Admitting: Family Medicine

## 2009-07-16 LAB — CONVERTED CEMR LAB
INR: 1.5
Prothrombin Time: 14.9 s

## 2009-07-17 ENCOUNTER — Ambulatory Visit: Payer: Self-pay | Admitting: Family Medicine

## 2009-07-24 ENCOUNTER — Ambulatory Visit: Payer: Self-pay | Admitting: Orthopedic Surgery

## 2009-07-31 ENCOUNTER — Ambulatory Visit: Payer: Self-pay | Admitting: Family Medicine

## 2009-07-31 LAB — CONVERTED CEMR LAB
INR: 2.1
Prothrombin Time: 17.9 s

## 2009-08-14 ENCOUNTER — Ambulatory Visit: Payer: Self-pay | Admitting: Family Medicine

## 2009-08-14 LAB — CONVERTED CEMR LAB

## 2009-09-02 ENCOUNTER — Encounter (INDEPENDENT_AMBULATORY_CARE_PROVIDER_SITE_OTHER): Payer: Self-pay | Admitting: *Deleted

## 2009-09-10 ENCOUNTER — Encounter: Payer: Self-pay | Admitting: Family Medicine

## 2009-09-10 ENCOUNTER — Telehealth: Payer: Self-pay | Admitting: Family Medicine

## 2009-09-11 ENCOUNTER — Ambulatory Visit: Payer: Self-pay | Admitting: Family Medicine

## 2009-09-11 LAB — CONVERTED CEMR LAB
INR: 2.7
Prothrombin Time: 19.9 s

## 2009-09-26 ENCOUNTER — Encounter: Payer: Self-pay | Admitting: Family Medicine

## 2009-10-10 ENCOUNTER — Ambulatory Visit: Payer: Self-pay | Admitting: Family Medicine

## 2009-10-10 LAB — CONVERTED CEMR LAB
INR: 3.3
Prothrombin Time: 21.8 s

## 2009-10-24 ENCOUNTER — Ambulatory Visit: Payer: Self-pay | Admitting: Family Medicine

## 2009-10-24 LAB — CONVERTED CEMR LAB
INR: 2.8
Prothrombin Time: 20.2 s

## 2009-11-21 ENCOUNTER — Ambulatory Visit: Payer: Self-pay | Admitting: Family Medicine

## 2009-11-21 LAB — CONVERTED CEMR LAB: INR: 3.6

## 2009-12-20 ENCOUNTER — Ambulatory Visit: Payer: Self-pay | Admitting: Family Medicine

## 2009-12-20 LAB — CONVERTED CEMR LAB: INR: 2.4

## 2009-12-23 ENCOUNTER — Encounter: Payer: Self-pay | Admitting: Family Medicine

## 2010-01-09 ENCOUNTER — Ambulatory Visit: Payer: Self-pay | Admitting: Family Medicine

## 2010-01-09 LAB — CONVERTED CEMR LAB
BUN: 20 mg/dL
CO2: 27 meq/L
Calcium: 9.1 mg/dL
Chloride: 108 meq/L
Creatinine, Ser: 1.4 mg/dL
GFR calc non Af Amer: 52.98 mL/min
Glucose, Bld: 101 mg/dL — ABNORMAL HIGH
Hgb A1c MFr Bld: 6.2 %
INR: 3.4
Potassium: 5.7 meq/L — ABNORMAL HIGH
Prothrombin Time: 41.3 s
Sodium: 140 meq/L

## 2010-01-16 ENCOUNTER — Ambulatory Visit: Payer: Self-pay | Admitting: Family Medicine

## 2010-01-16 DIAGNOSIS — F17200 Nicotine dependence, unspecified, uncomplicated: Secondary | ICD-10-CM | POA: Insufficient documentation

## 2010-01-16 DIAGNOSIS — E875 Hyperkalemia: Secondary | ICD-10-CM | POA: Insufficient documentation

## 2010-01-21 ENCOUNTER — Telehealth: Payer: Self-pay | Admitting: Family Medicine

## 2010-02-06 ENCOUNTER — Ambulatory Visit: Payer: Self-pay | Admitting: Family Medicine

## 2010-02-06 LAB — CONVERTED CEMR LAB
INR: 2.7
Prothrombin Time: 32.6 s

## 2010-02-07 ENCOUNTER — Ambulatory Visit: Payer: Self-pay | Admitting: Orthopedic Surgery

## 2010-02-13 ENCOUNTER — Ambulatory Visit: Payer: Self-pay | Admitting: Family Medicine

## 2010-02-13 LAB — CONVERTED CEMR LAB
Albumin: 3.3 g/dL — ABNORMAL LOW (ref 3.5–5.2)
BUN: 44 mg/dL — ABNORMAL HIGH (ref 6–23)
CO2: 28 meq/L (ref 19–32)
Calcium: 8.8 mg/dL (ref 8.4–10.5)
Chloride: 101 meq/L (ref 96–112)
Creatinine, Ser: 1.3 mg/dL (ref 0.4–1.5)
GFR calc non Af Amer: 57.7 mL/min (ref >60)
Glucose, Bld: 202 mg/dL — ABNORMAL HIGH (ref 70–99)
Phosphorus: 3.5 mg/dL (ref 2.3–4.6)
Potassium: 5 meq/L (ref 3.5–5.1)
Sodium: 138 meq/L (ref 135–145)

## 2010-02-18 ENCOUNTER — Ambulatory Visit: Payer: Self-pay | Admitting: Family Medicine

## 2010-02-18 DIAGNOSIS — L719 Rosacea, unspecified: Secondary | ICD-10-CM | POA: Insufficient documentation

## 2010-02-18 DIAGNOSIS — N259 Disorder resulting from impaired renal tubular function, unspecified: Secondary | ICD-10-CM | POA: Insufficient documentation

## 2010-03-06 ENCOUNTER — Ambulatory Visit: Payer: Self-pay | Admitting: Family Medicine

## 2010-03-06 LAB — CONVERTED CEMR LAB
INR: 3.5
Prothrombin Time: 42.4 s

## 2010-03-10 ENCOUNTER — Telehealth: Payer: Self-pay | Admitting: Family Medicine

## 2010-03-12 ENCOUNTER — Telehealth: Payer: Self-pay | Admitting: Family Medicine

## 2010-03-20 ENCOUNTER — Ambulatory Visit: Payer: Self-pay | Admitting: Family Medicine

## 2010-03-20 LAB — CONVERTED CEMR LAB
INR: 3.3
Prothrombin Time: 39.3 s

## 2010-04-07 ENCOUNTER — Ambulatory Visit: Payer: Self-pay | Admitting: Family Medicine

## 2010-04-17 ENCOUNTER — Ambulatory Visit: Payer: Self-pay | Admitting: Family Medicine

## 2010-04-17 LAB — CONVERTED CEMR LAB
INR: 2.3
Prothrombin Time: 27.9 s

## 2010-04-24 ENCOUNTER — Encounter (INDEPENDENT_AMBULATORY_CARE_PROVIDER_SITE_OTHER): Payer: Self-pay | Admitting: *Deleted

## 2010-05-15 ENCOUNTER — Ambulatory Visit: Payer: Self-pay | Admitting: Family Medicine

## 2010-05-15 LAB — CONVERTED CEMR LAB
BUN: 21 mg/dL (ref 6–23)
CO2: 27 meq/L (ref 19–32)
Calcium: 9.1 mg/dL (ref 8.4–10.5)
Chloride: 108 meq/L (ref 96–112)
Creatinine, Ser: 1.3 mg/dL (ref 0.4–1.5)
GFR calc non Af Amer: 58.7 mL/min (ref >60)
Glucose, Bld: 98 mg/dL (ref 70–99)
INR: 2
Potassium: 5.4 meq/L — ABNORMAL HIGH (ref 3.5–5.1)
Prothrombin Time: 23.6 s
Sodium: 140 meq/L (ref 135–145)

## 2010-05-20 ENCOUNTER — Ambulatory Visit: Payer: Self-pay | Admitting: Family Medicine

## 2010-05-20 LAB — CONVERTED CEMR LAB: Potassium: 4.6 meq/L (ref 3.5–5.1)

## 2010-06-12 ENCOUNTER — Ambulatory Visit: Payer: Self-pay | Admitting: Family Medicine

## 2010-06-12 LAB — CONVERTED CEMR LAB
INR: 2.1
Prothrombin Time: 24.6 s

## 2010-06-26 ENCOUNTER — Ambulatory Visit: Payer: Self-pay | Admitting: Family Medicine

## 2010-07-08 ENCOUNTER — Encounter: Payer: Self-pay | Admitting: Family Medicine

## 2010-07-10 ENCOUNTER — Ambulatory Visit: Payer: Self-pay | Admitting: Family Medicine

## 2010-07-10 LAB — CONVERTED CEMR LAB
INR: 1.8
Prothrombin Time: 21.8 s

## 2010-07-21 ENCOUNTER — Ambulatory Visit: Payer: Self-pay | Admitting: Family Medicine

## 2010-07-21 LAB — CONVERTED CEMR LAB
ALT: 23 units/L (ref 0–53)
AST: 26 units/L (ref 0–37)
Albumin: 3.6 g/dL (ref 3.5–5.2)
Alkaline Phosphatase: 56 units/L (ref 39–117)
BUN: 30 mg/dL — ABNORMAL HIGH (ref 6–23)
Bilirubin, Direct: 0.1 mg/dL (ref 0.0–0.3)
CO2: 28 meq/L (ref 19–32)
Calcium: 8.6 mg/dL (ref 8.4–10.5)
Chloride: 102 meq/L (ref 96–112)
Cholesterol: 98 mg/dL (ref 0–200)
Creatinine, Ser: 1.7 mg/dL — ABNORMAL HIGH (ref 0.4–1.5)
Creatinine,U: 331.3 mg/dL
GFR calc non Af Amer: 43.16 mL/min (ref >60)
Glucose, Bld: 110 mg/dL — ABNORMAL HIGH (ref 70–99)
HDL: 36.1 mg/dL — ABNORMAL LOW (ref >39.00)
Hgb A1c MFr Bld: 6 % (ref 4.6–6.5)
LDL Cholesterol: 49 mg/dL (ref 0–99)
Microalb Creat Ratio: 16.1 mg/g (ref 0.0–30.0)
Microalb, Ur: 53.4 mg/dL — ABNORMAL HIGH (ref 0.0–1.9)
PSA: 0.45 ng/mL (ref 0.10–4.00)
Potassium: 5 meq/L (ref 3.5–5.1)
Sodium: 143 meq/L (ref 135–145)
Total Bilirubin: 0.6 mg/dL (ref 0.3–1.2)
Total CHOL/HDL Ratio: 3
Total Protein: 5.8 g/dL — ABNORMAL LOW (ref 6.0–8.3)
Triglycerides: 64 mg/dL (ref 0.0–149.0)
VLDL: 12.8 mg/dL (ref 0.0–40.0)

## 2010-07-24 ENCOUNTER — Ambulatory Visit: Payer: Self-pay | Admitting: Family Medicine

## 2010-07-24 LAB — HM DIABETES FOOT EXAM

## 2010-07-29 ENCOUNTER — Ambulatory Visit: Payer: Self-pay | Admitting: Family Medicine

## 2010-07-30 ENCOUNTER — Encounter (INDEPENDENT_AMBULATORY_CARE_PROVIDER_SITE_OTHER): Payer: Self-pay | Admitting: *Deleted

## 2010-07-30 LAB — CONVERTED CEMR LAB: Fecal Occult Bld: NEGATIVE

## 2010-08-07 ENCOUNTER — Ambulatory Visit: Payer: Self-pay | Admitting: Family Medicine

## 2010-08-07 LAB — CONVERTED CEMR LAB
INR: 1.1
Prothrombin Time: 12.8 s

## 2010-08-21 ENCOUNTER — Ambulatory Visit: Payer: Self-pay | Admitting: Family Medicine

## 2010-08-21 LAB — CONVERTED CEMR LAB
INR: 2.9
Prothrombin Time: 35 s

## 2010-09-04 ENCOUNTER — Ambulatory Visit: Payer: Self-pay | Admitting: Family Medicine

## 2010-09-04 LAB — CONVERTED CEMR LAB
INR: 3.2
Prothrombin Time: 38.9 s

## 2010-10-01 ENCOUNTER — Ambulatory Visit
Admission: RE | Admit: 2010-10-01 | Discharge: 2010-10-01 | Payer: Self-pay | Source: Home / Self Care | Attending: Family Medicine | Admitting: Family Medicine

## 2010-10-02 ENCOUNTER — Ambulatory Visit: Admit: 2010-10-02 | Payer: Self-pay | Admitting: Family Medicine

## 2010-10-10 ENCOUNTER — Encounter: Payer: Self-pay | Admitting: Family Medicine

## 2010-10-16 ENCOUNTER — Ambulatory Visit
Admission: RE | Admit: 2010-10-16 | Discharge: 2010-10-16 | Payer: Self-pay | Source: Home / Self Care | Attending: Family Medicine | Admitting: Family Medicine

## 2010-10-16 LAB — CONVERTED CEMR LAB
INR: 2.3
Prothrombin Time: 27.1 s

## 2010-10-22 NOTE — Assessment & Plan Note (Signed)
Summary: 6 MTH FU/CLE   Vital Signs:  Patient profile:   75 year old male Weight:      211.50 pounds BMI:     31.81 Temp:     98.2 degrees F oral Pulse rate:   76 / minute Pulse rhythm:   regular BP sitting:   110 / 60  (left arm) Cuff size:   large  Vitals Entered By: Sydell Axon LPN (January 16, 2010 9:54 AM) CC: 6 Month follow-up   History of Present Illness: Pt here for followup of BP and Diabetes. He feels well and has no complaints except he is back to smoking more and wants to quit. He had stopped 16 years and slowly started back.  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 One Tab By Mouth Every Am 2)  Warfarin Sodium 5 Mg Tabs (Warfarin Sodium) .... Take One By Mouth Every Day But Take 1 and 1/2 On Tues and Thurs. 3)  Metoprolol Succinate 50 Mg  Tb24 (Metoprolol Succinate) .... Take One By Mouth Every Am 4)  Vytorin 10-40 Mg  Tabs (Ezetimibe-Simvastatin) .... Take One By Mouth At Bedtime 5)  Lisinopril 5 Mg Tabs (Lisinopril) .... One Tab By Mouth Once Daily 6)  Actoplus Met 15-500 Mg Tabs (Pioglitazone Hcl-Metformin Hcl) .... One Tab By Mouth Two Times A Day 7)  Nystatin-Triamcinolone 100000-0.1 Unit/gm-% Oint (Nystatin-Triamcinolone) .... Use As Directed To Affected Area 8)  Allopurinol 300 Mg Tabs  (Allopurinol) .... One Tab By Mouth Once Daily  Allergies: 1)  ! Sudafed (Pseudoephedrine Hcl)  Physical Exam  General:  Well-developed,well-nourished,in no acute distress; alert,appropriate and cooperative throughout examination Head:  Normocephalic and atraumatic without obvious abnormalities. No apparent alopecia or balding. Sinuses nontender. Eyes:  Conjunctiva clear bilaterally. Well hydrated and moist. Lower lids slightly droopy with blepharoplasty suggested by his eye dr. Verlee Monte:  External ear exam shows no significant lesions or deformities.  Otoscopic examination reveals clear canals, tympanic membranes are intact bilaterally without bulging, retraction, inflammation or discharge. Hearing is grossly normal bilaterally with hearing aids in  both ears.. Nose:  External nasal examination shows no deformity or inflammation. Nasal mucosa are pink and moist without lesions or exudates. Mouth:  Oral mucosa and oropharynx without lesions or exudates.  Teeth in good repair. Neck:  No deformities, masses, or tenderness noted. Lungs:  Normal respiratory effort, chest expands symmetrically. Lungs are clear to auscultation, no crackles or wheezes. Heart:  Normal rate and irregular rhythm. S1 and S2 normal without gallop, murmur, click, rub or other extra sounds. Rate varies but well controlled.   Impression & Recommendations:  Problem # 1:  DIABETES MELLITUS, TYPE II (ICD-250.00) Assessment Improved  Good control. Contines with A1C around 6.1-6.2. His updated medication list for this problem includes:    Lisinopril 5 Mg Tabs (Lisinopril) ..... One tab by mouth once daily  Actoplus Met 15-500 Mg Tabs (Pioglitazone hcl-metformin hcl) ..... One tab by mouth two times a day  Labs Reviewed: Creat: 1.4 (01/09/2010)   Microalbumin: 83.3 (06/21/2006)  Last Eye Exam: normal (03/05/2009) Reviewed HgBA1c results: 6.2 (01/09/2010)  6.3 (06/21/2009)  Problem # 2:  NICOTINE ADDICTION  (ICD-305.1) Assessment: Deteriorated Will discuss Chantix next time.  Problem # 3:  ESSENTIAL HYPERTENSION, BENIGN (ICD-401.1) Assessment: Unchanged Great control. Cont curr meds for now. His updated medication list for this problem includes:    Verapamil Hcl Cr 120 Mg Cr-tabs (Verapamil hcl) ..... One tab by mouth every am    Metoprolol Succinate 50 Mg Tb24 (Metoprolol succinate) .Marland Kitchen... Take one by mouth every am    Lisinopril 5 Mg Tabs (Lisinopril) ..... One tab by mouth once daily  BP today: 110/60 Prior BP: 104/60 (07/17/2009)  Labs Reviewed: K+: 5.7 (01/09/2010) Creat: : 1.4 (01/09/2010)   Chol: 102 (06/21/2009)   HDL: 35.60 (06/21/2009)   LDL: 47 (06/21/2009)   TG: 97.0 (06/21/2009)  Problem # 4:  HYPERKALEMIA (ICD-276.7) Assessment: New Look into foods rich in potassium and avoid. Will recheck in opne month and decrease/stop ACEI if no improvement. His baseline K runs slightly high routinely, just slightly higher today.  Complete Medication List: 1)  Verapamil Hcl Cr 120 Mg Cr-tabs (Verapamil hcl) .... One tab by mouth every am 2)  Warfarin Sodium 5 Mg Tabs (Warfarin sodium) .... Take one by mouth every day but take 1 and 1/2 on thurs. 3)  Metoprolol Succinate 50 Mg Tb24 (Metoprolol succinate) .... Take one by mouth every am 4)  Vytorin 10-40 Mg Tabs (Ezetimibe-simvastatin) .... Take one by mouth at bedtime 5)  Lisinopril 5 Mg Tabs (Lisinopril) .... One tab by mouth once daily 6)  Actoplus Met 15-500 Mg Tabs (Pioglitazone hcl-metformin hcl) .... One tab by mouth two times a day 7)  Nystatin-triamcinolone 100000-0.1 Unit/gm-% Oint (Nystatin-triamcinolone) .... Use as directed to affected area 8)  Allopurinol 300 Mg Tabs (Allopurinol) .... One tab by mouth once daily  Patient Instructions: 1)  RTC one month Renal Panel prior 276.8 2)  Wants to quit smoking....will investigate Chantix. Discuss next time.  Current Allergies (reviewed today): ! SUDAFED (PSEUDOEPHEDRINE  HCL)  Appended Document: 6 MTH FU/CLE When start Chantix, check PT one week later.  Appended Document: 6 MTH FU/CLE Spoke with pt's wife to start Chantix and keep appt next week.

## 2010-10-22 NOTE — Progress Notes (Signed)
Summary: Rx Chantix  Phone Note Refill Request Call back at [phone] Message from:  [facility] on March 10, 2010 10:19 AM  Refills Requested: Medication #1:  CHANTIX STARTING MONTH PAK 0.5 MG X 11 & 1 MG X 42 TABS as dir  Method Requested: Electronic Initial call taken by: Sydell Axon LPN,  March 10, 2010 10:21 AM    Prescriptions: CHANTIX STARTING MONTH PAK 0.5 MG X 11 & 1 MG X 42 TABS (VARENICLINE TARTRATE) as dir  #1 pak x 0   Entered and Authorized by:   Shaune Leeks MD   Signed by:   Shaune Leeks MD on 03/10/2010   Method used:   Electron[facility] to        [facility] (retail)       [address]       [place], [place]  [address]       Ph: [phone] or [phone]       Fax: [phone]   RxID:   [id number]

## 2010-10-22 NOTE — Progress Notes (Signed)
Summary: needs written script for test strips  Phone Note Call from Patient   Caller: Patient Call For: Shaune Leeks MD Summary of Call: Pt needs a written script for one touch test strips.  Script needs to say how many times a day to test and needs dx code. Initial call taken by: Lowella Petties CMA,  Jan 21, 2010 11:38 AM  Follow-up for Phone Call        Please put in medication list and will prescribe. Shaune Leeks MD  Jan 21, 2010 12:36 PM  Sent. Follow-up by: Shaune Leeks MD,  Jan 21, 2010 1:08 PM  Additional Follow-up for Phone Call Additional follow up Details #1::        Script cancelled at [facility], written script printed and given to pt.  Dx 250.00. Additional Follow-up by: Lowella Petties CMA,  Jan 21, 2010 2:00 PM    New/Updated Medications: ONETOUCH TEST  STRP (GLUCOSE BLOOD) Use once daily Prescriptions: ONETOUCH TEST  STRP (GLUCOSE BLOOD) Use once daily  #100 x 4   Entered by:   Lowella Petties CMA   Authorized by:   Shaune Leeks MD   Signed by:   Lowella Petties CMA on 01/21/2010   Method used:   Print then Give to Patient   RxID:   [id number] ONETOUCH TEST  STRP (GLUCOSE BLOOD) Use once daily  #100 x 4   Entered and Authorized by:   Shaune Leeks MD   Signed by:   Shaune Leeks MD on 01/21/2010   Method used:   [facility]        [facility] (retail)       [address]       [place], [place]  [address]       Ph: [phone] or [phone]       Fax: [phone]   RxID:   [id number]

## 2010-10-22 NOTE — Letter (Signed)
Summary: Pre-op Medical Clearance Form from Dr.Gandhi,Ophthalmology  Pre-op Medical Clearance Form from Dr.Gandhi,Ophthalmology   Imported By: Beau Fanny 09/26/2009 11:34:23  _____________________________________________________________________  External Attachment:    Type:   Image     Comment:   External Document

## 2010-10-22 NOTE — Letter (Signed)
Summary: Dr.Ranjan Sharma,Allergy,Asthma [facility] Sinus [facility],Note  Dr.Ranjan Sharma,Allergy,Asthma & Sinus Care,Note   Imported By: Beau Fanny 01/28/2010 15:07:22  _____________________________________________________________________  External Attachment:    Type:   Image     Comment:   External Document

## 2010-10-22 NOTE — Assessment & Plan Note (Signed)
Summary: ONE MTH F/U AFTER LABS / LFW   Vital Signs:  Patient profile:   75 year old male Weight:      208.50 pounds Temp:     97.7 degrees F oral Pulse rate:   80 / minute Pulse rhythm:   regular BP sitting:   104 / 60  (left arm) Cuff size:   large  Vitals Entered By: Sydell Axon LPN (Feb 18, 2010 9:51 AM) CC: One month follow-up after labs   History of Present Illness: Pt here with wife for followup of potassium. He also was recently seen by ortho for right knee swelling and pain. He was put on NSAIDs, Indocin, for suspicion of gout. His kidney function is off slightly. His potassium is better but still high.  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 One Tab By Mouth Every Am 2)  Warfarin Sodium 5 Mg Tabs (Warfarin Sodium) .... Take One By Mouth Every Day But Take 1 and 1/2 On Thurs. 3)  Metoprolol Succinate 50 Mg  Tb24 (Metoprolol Succinate) .... Take One By Mouth Every Am 4)  Vytorin 10-40 Mg  Tabs (Ezetimibe-Simvastatin) .... Take One By Mouth At Bedtime 5)  Lisinopril 5 Mg Tabs (Lisinopril) .... One Tab By Mouth Once Daily 6)  Actoplus Met 15-500 Mg Tabs (Pioglitazone Hcl-Metformin Hcl) .... One Tab By  Mouth Two Times A Day 7)  Nystatin-Triamcinolone 100000-0.1 Unit/gm-% Oint (Nystatin-Triamcinolone) .... Use As Directed To Affected Area 8)  Allopurinol 300 Mg Tabs (Allopurinol) .... One Tab By Mouth Once Daily 9)  Onetouch Test  Strp (Glucose Blood) .... Use Once Daily 10)  Chantix Starting Month Pak 0.5 Mg X 11 & 1 Mg X 42 Tabs (Varenicline Tartrate) .... As Dir  Allergies: 1)  ! Sudafed (Pseudoephedrine Hcl)  Physical Exam  General:  Well-developed,well-nourished,in no acute distress; alert,appropriate and cooperative throughout examination Head:  Normocephalic and atraumatic without obvious abnormalities. No apparent alopecia or balding. Sinuses nontender. Eyes:  Conjunctiva clear bilaterally. Well hydrated and moist. Lower lids slightly droopy with blepharoplasty suggested by his eye dr. Verlee Monte:  External ear exam shows no significant lesions or deformities.  Otoscopic examination reveals clear canals, tympanic membranes are intact bilaterally without bulging, retraction, inflammation or discharge. Hearing is grossly normal bilaterally with hearing aids in  both ears.. Nose:  External nasal examination shows no deformity or inflammation. Nasal mucosa are pink and moist without lesions or exudates. Mouth:  Oral mucosa and oropharynx without lesions or exudates.  Teeth in good repair. Neck:  No deformities, masses, or tenderness noted. Lungs:  Normal respiratory effort, chest expands symmetrically. Lungs are clear to auscultation, no crackles or wheezes. Heart:  Normal rate and irregular rhythm. S1 and S2 normal without gallop, murmur, click, rub or other extra sounds.  Rate varies but well controlled. Skin:  Violaceous ruddy skin of the distal nose.   Impression & Recommendations:  Problem # 1:  HYPERKALEMIA (ICD-276.7) Assessment Improved Will cont to follow. Better. Avoid NSAIDs and drink water.  Problem # 2:  ESSENTIAL HYPERTENSION, BENIGN (ICD-401.1) Assessment: Unchanged Good  control. Cont curr meds. Will cont Lisinopril. His updated medication list for this problem includes:    Verapamil Hcl Cr 120 Mg Cr-tabs (Verapamil hcl) ..... One tab by mouth every am    Metoprolol Succinate 50 Mg Tb24 (Metoprolol succinate) .Marland Kitchen... Take one by mouth every am    Lisinopril 5 Mg Tabs (Lisinopril) ..... One tab by mouth once daily  BP today: 104/60 Prior BP: 110/60 (01/16/2010)  Labs Reviewed: K+: 5.0 (02/13/2010) Creat: : 1.3 (02/13/2010)   Chol: 102 (06/21/2009)   HDL: 35.60 (06/21/2009)   LDL: 47 (06/21/2009)   TG: 97.0 (06/21/2009)  Problem # 3:  GOUT (ICD-274.9) Assessment: Unchanged Coukld be this but risk is much less with current Allopurinol dosing. Last Uric Acid 5.2 last Fall. His updated medication list for this problem includes:    Allopurinol 300 Mg Tabs (Allopurinol) ..... One tab by mouth once daily  Elevate extremity; warm compresses, symptomatic relief and medication as directed.   Problem # 4:  DIABETES MELLITUS, TYPE II (ICD-250.00) Assessment: Unchanged  His updated medication list for this problem includes:    Lisinopril 5 Mg Tabs (Lisinopril) ..... One tab by mouth once daily    Actoplus Met 15-500 Mg Tabs (Pioglitazone hcl-metformin hcl) ..... One tab by mouth two times a day  Labs Reviewed: Creat: 1.3 (02/13/2010)   Microalbumin: 83.3 (06/21/2006)  Last Eye Exam: normal (03/05/2009) Reviewed HgBA1c results: 6.2 (01/09/2010)  6.3 (06/21/2009)  Problem # 5:  RENAL INSUFFICIENCY (ICD-588.9) Assessment: New Mild but discussed to bring up the subject and let he and his wife know the criticalness of sugar, BP, NSAID use, etc.  Problem # 6:  ROSACEA (ICD-695.3) Assessment: Unchanged Trial of Metrogel for one tube and assess on his own. Stop if he doesn't think it helps. Noticed now I gave Metrogel Vaginal...don't think this will make any difference as it is still topical.  Complete Medication List: 1)  Verapamil Hcl Cr 120 Mg Cr-tabs  (Verapamil hcl) .... One tab by mouth every am 2)  Warfarin Sodium 5 Mg Tabs (Warfarin sodium) .... Take one by mouth every day but take 1 and 1/2 on thurs. 3)  Metoprolol Succinate 50 Mg Tb24 (Metoprolol succinate) .... Take one by mouth every am 4)  Vytorin 10-40 Mg Tabs (Ezetimibe-simvastatin) .... Take one by mouth at bedtime 5)  Lisinopril 5 Mg Tabs (Lisinopril) .... One tab by mouth once daily 6)  Actoplus Met 15-500 Mg Tabs (Pioglitazone hcl-metformin hcl) .... One tab by mouth two times a day 7)  Nystatin-triamcinolone 100000-0.1 Unit/gm-% Oint (Nystatin-triamcinolone) .... Use as directed to affected area 8)  Allopurinol 300 Mg Tabs (Allopurinol) .... One tab by mouth once daily 9)  Onetouch Test Strp (Glucose blood) .... Use once daily 10)  Chantix Starting Month Pak 0.5 Mg X 11 & 1 Mg X 42 Tabs (Varenicline tartrate) .... As dir 11)  Metrogel-vaginal 0.75 % Gel (Metronidazole) .... Apply to nose daily  Patient Instructions: 1)  RTC 3 mos with Dr Para March. Bmet prior. 250.00 Prescriptions: METROGEL-VAGINAL 0.75 % GEL (METRONIDAZOLE) apply to nose daily  #1 tube x 12   Entered and Authorized by:   Shaune Leeks MD   Signed by:  Shaune Leeks MD on 02/18/2010   Method used:   [facility]        [facility] [address] [facility])       [address]       [place], [place]  [address]       Ph: [phone] or [phone]       Fax: [phone]   RxID:   [id number]   Current Allergies (reviewed today): ! SUDAFED (PSEUDOEPHEDRINE HCL)

## 2010-10-22 NOTE — Letter (Signed)
Summary: Results Follow up Letter  [facility]  [address] [place], [place] [address]   Phone: [phone]  Fax: [phone]    07/30/2010 MRN: [id number]    Benjamin Soto [address] [place], [place]  [address]    Dear Mr. ROUDEBUSH,  The following are the results of your recent test(s):  Test         Result    Pap Smear:        Normal _____  Not Normal _____ Comments: ______________________________________________________ Cholesterol: LDL(Bad cholesterol):         Your goal is less than:         HDL (Good cholesterol):       Your goal is more than: Comments:  ______________________________________________________ Mammogram:        Normal _____  Not Normal _____ Comments:  ___________________________________________________________________ Hemoccult:        Normal ___X__  Not normal _______ Comments:  Yearly follow up is recommended.   _____________________________________________________________________ Other Tests:    We routinely do not discuss normal results over the telephone.  If you desire a copy of the results, or you have any questions about this information we can discuss them at your next office visit.   Sincerely,    Dwana Curd. Para March, M.D.  [facility]

## 2010-10-22 NOTE — Letter (Signed)
Summary: Medical Clearance/[facility]  [facility]   Imported By: Lanelle Bal 07/16/2010 13:31:15  _____________________________________________________________________  External Attachment:    Type:   Image     Comment:   External Document

## 2010-10-22 NOTE — Assessment & Plan Note (Signed)
Summary: FLU SHOT/CLE  Nurse Visit   Allergies: 1)  ! Chantix 2)  ! Lisinopril  Orders Added: 1)  Flu Vaccine 65yrs + MEDICARE PATIENTS [Q2039] 2)  Administration Flu vaccine - MCR [G0008]  Flu Vaccine Consent Questions     Do you have a history of severe allergic reactions to this vaccine? no    Any prior history of allergic reactions to egg and/or gelatin? no    Do you have a sensitivity to the preservative Thimersol? no    Do you have a past history of Guillan-Barre Syndrome? no    Do you currently have an acute febrile illness? no    Have you ever had a severe reaction to latex? no    Vaccine information given and explained to patient? yes    Are you currently pregnant? no    Lot Number:[id number]   Exp Date:03/21/2011   Site Given  Left Deltoid IMu

## 2010-10-22 NOTE — Progress Notes (Signed)
Summary: regarding chantix  Phone Note Call from Patient Call back at Home Phone [phone]   Caller: Patient Call For: Shaune Leeks MD Summary of Call: Pt took chantix for about a month and have some problems with this.   He stopped taking this monday because of side effects-nausea, whooziness, wierd dreams.  He is continuing to have these side effects and he is asking if there was any harm done by just stopping the medicine. Initial call taken by: Lowella Petties CMA,  March 12, 2010 4:01 PM  Follow-up for Phone Call        No and the side effects he experienced are typical. The biggest problemm was that he should have tapered off not just stopped it, but the effects should go away in another 24-48 hrs. Follow-up by: Shaune Leeks MD,  March 12, 2010 4:09 PM  Additional Follow-up for Phone Call Additional follow up Details #1::        Advised pt. Additional Follow-up by: Lowella Petties CMA,  March 12, 2010 4:13 PM

## 2010-10-22 NOTE — Assessment & Plan Note (Signed)
Summary: place on arm ?? possible infection /alc   Vital Signs:  Patient profile:   75 year old male Weight:      212.25 pounds Temp:     98.0 degrees F oral Pulse rate:   64 / minute Pulse rhythm:   regular BP sitting:   110 / 70  (left arm) Cuff size:   large  Vitals Entered By: Janee Morn CMA (April 07, 2010 11:27 AM) CC: Check place on arm   History of Present Illness: 75 year old male:  male who scratched his left arm the other day, now has some bruising and itching. Concerned that may have infection  no fever, chills sweats, no warmth. no induration, no pus, no discharge    Physical Exam  General:  Well-developed,well-nourished,in no acute distress; alert,appropriate and cooperative throughout examination Head:  normocephalic and atraumatic.   Ears:  no external deformities.   Nose:  no external deformity.   Skin:  multiple ecchymotic areas L arm with small scratch, no warmth, no fluctuance   Impression & Recommendations:  Problem # 1:  ATRIAL FIBRILLATION (ICD-427.31) c/w bruising from coumadin, not cellulitis c/w conservative wound care  His updated medication list for this problem includes:    Verapamil Hcl Cr 120 Mg Cr-tabs (Verapamil hcl) ..... One tab by mouth every am    Warfarin Sodium 5 Mg Tabs (Warfarin sodium) .Marland Kitchen... Take one by mouth every day but take 1 and 1/2 on thurs.    Metoprolol Succinate 50 Mg Tb24 (Metoprolol succinate) .Marland Kitchen... Take one by mouth every am  Complete Medication List: 1)  Verapamil Hcl Cr 120 Mg Cr-tabs (Verapamil hcl) .... One tab by mouth every am 2)  Warfarin Sodium 5 Mg Tabs (Warfarin sodium) .... Take one by mouth every day but take 1 and 1/2 on thurs. 3)  Metoprolol Succinate 50 Mg Tb24 (Metoprolol succinate) .... Take one by mouth every am 4)  Vytorin 10-40 Mg Tabs (Ezetimibe-simvastatin) .... Take one by mouth at bedtime 5)  Lisinopril 5 Mg Tabs (Lisinopril) .... One tab by mouth once daily 6)  Actoplus Met 15-500 Mg Tabs  (Pioglitazone hcl-metformin hcl) .... One tab by mouth two times a day 7)  Nystatin-triamcinolone 100000-0.1 Unit/gm-% Oint (Nystatin-triamcinolone) .... Use as directed to affected area 8)  Allopurinol 300 Mg Tabs (Allopurinol) .... One tab by mouth once daily 9)  Metrogel-vaginal 0.75 % Gel (Metronidazole) .... Apply to nose daily 10)  Onetouch Test Strp (Glucose blood) .... Use once daily

## 2010-10-22 NOTE — Letter (Signed)
Summary: Nadara Eaton letter  [place] at [facility]  [address] [place], [place] [address]   Phone: [phone]  Fax: [phone]       04/24/2010 MRN: [id number]  KABIR BRANNOCK [address] [place], [place]  [address]  Dear Mr. BEAUDEN, [facility], and [facility] announce the retirement of Arta Silence, M.D., from full-time practice at the [facility] office effective March 20, 2010 and his plans of returning part-time.  It is important to Dr. Hetty Ely and to our practice that you understand that [facility] has seven physicians in our office for your health care needs.  We will continue to offer the same exceptional care that you have today.    Dr. Hetty Ely has spoken to many of you about his plans for retirement and returning part-time in the fall.   We will continue to work with you through the transition to schedule appointments for you in the office and meet the high standards that [place] is committed to.   Again, it is with great pleasure that we share the news that Dr. Hetty Ely will return to [facility] at [facility] in October of 2011 with a reduced schedule.    If you have any questions, or would like to request an appointment with one of our physicians, please call us at [phone] and press the option for Scheduling an appointment.  We take pleasure in providing you with excellent patient care and look forward to seeing you at your next office visit.  Our [facility] Physicians are:  Tillman Abide, M.D. Laurita Quint, M.D. Roxy Manns, M.D. Kerby Nora, M.D. Hannah Beat, M.D. Ruthe Mannan, M.D. We proudly welcomed Raechel Ache, M.D. and Eustaquio Boyden, M.D. to the practice in July/August 2011.  Sincerely,  [place] [facility] [place] [facility]

## 2010-10-22 NOTE — Letter (Signed)
Summary: [name]: Immunoassay Fecal Occult Blood (iFOB) Order Form  [place] at [facility]  [address] [place], [place] [address]   Phone: [phone]  Fax: [phone]      [facility]: Immunoassay Fecal Occult Blood (iFOB) Order Form   July 24, 2010 MRN: [id number]   Benjamin Soto 1935-11-24   Physicican Name:_______duncan__________________  Diagnosis Code:_________v76.49_________________      Crawford Givens MD

## 2010-10-22 NOTE — Assessment & Plan Note (Signed)
Summary: CPX / LFW   Vital Signs:  Patient profile:   76 year old male Height:      68.50 inches Weight:      219.50 pounds BMI:     33.01 Temp:     97.8 degrees F oral Pulse rate:   84 / minute Pulse rhythm:   regular BP sitting:   110 / 70  (left arm) Cuff size:   large  Vitals Entered By: Delilah Shan CMA Duncan Dull) (July 24, 2010 8:47 AM) CC: CPX, Preventive Care   History of Present Illness: Diabetes:  Using medications without difficulties:yes Hypoglycemic episodes:no Hyperglycemic episodes:no Feet problems: occ pain Blood Sugars averaging: ~90s   Elevated Cholesterol: Using medications without problems:yes Muscle aches: no Other complaints:no  Hypertension:      Using medication without problems or lightheadedness: yes Chest pain with exertion:no Edema:no Short of breath:no  R midfoot pain.  H/o gout.  Episodic periods of pain that last a few days.  Pain going on for a few days now.  It will usually resolve in a few days.  Occ with similar flares in R ankle.   I d/w patient ZO:XWRU . H/o hyperkalemia on ACE.  resolved off ACE.    Cr function in typical range for patient.  I d/w patient re: MALB and control of BP/chol/DM2.  See plan.   Allergies: 1)  ! Chantix 2)  ! Lisinopril  Past History:  Past Medical History: Last updated: 05/20/2010 Atrial fibrillation (01/20/2003) Diabetes mellitus, type II (09/21/1992) Gout (04/22/2003) Hyperlipidemia (05/22/2001) R knee pain- [place] [facility]  Past Surgical History: Last updated: 12/11/2007 HOSP ALA CTY TACHYCARDIA 1993 R MTP #1 BUNIONECTOMY  02/2004 CATH (CTY HOSP BY [facility]) NML 1994 COLONOSCOPY MNL (H/O POLYPS) 10/26/2002   5 yrs ECHO SEVERE LAE MILD MR,TR AI   07/22/2002 ETT ADENOSINE CARDIOLITE  OLD INFARCT EF 35%  03/20/2003 COLONOSCOPY DIVERTICS SM HEMMS (DR ELLIOTT) 12/06/2007     5 YRS  Family History: Last updated: Aug 05, 2008 Father 1978/01/06died intraoperatively for stomach pain Obese DM  Smoker Mother 01/06/1985Natural Causes Chronic Ulcer due Hip Surg due Fx Hip No sibs  Social History: Last updated: 05/20/2010 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  Otherwise negative.    Physical Exam  General:  GEN: nad, alert and oriented HEENT: mucous membranes moist NECK: supple w/o LA CV: regular rate and rhythm, not tachy and no ectopy PULM: ctab, no inc wob ABD: soft, +bs EXT: no edema SKIN: no acute rash  Rectal:  No external abnormalities noted. Normal sphincter tone. No rectal masses or tenderness. Prostate:  Prostate gland firm and smooth, no enlargement, nodularity, tenderness, mass, asymmetry or induration.  Diabetes Management Exam:    Foot Exam (with socks and/or shoes not present):       Sensory-Pinprick/Light touch:          Left medial foot (L-4): normal          Left dorsal foot (L-5): normal          Left lateral foot (S-1): normal          Right medial foot (L-4): normal          Right dorsal foot (L-5): normal  Right lateral foot (S-1): normal       Sensory-Monofilament:          Left foot: normal          Right foot: normal       Inspection:          Left foot: normal          Right foot: abnormal             Comments: erythema in R midfoot       Nails:          Left foot: normal          Right foot: normal   Impression & Recommendations:  Problem # 1:  RENAL INSUFFICIENCY (ICD-588.9) Continue to monitor.  Likey due to DM2/HTN.  Continue to control BP/glucose.   Problem # 2:  HYPERKALEMIA (ICD-276.7) No ACE.  D/w patient.  I would not restart given hx.   Problem # 3:  FOOT PAIN, BILATERAL (ICD-729.5) Likely gout.  Dec in pain today. after it resolves, increase allopurinol.  follow up  as needed.    Problem # 4:  ESSENTIAL HYPERTENSION, BENIGN (ICD-401.1) controlled.  His updated medication list for this problem includes:    Verapamil Hcl Cr 120 Mg Cr-tabs (Verapamil hcl) ..... One tab by mouth every am    Metoprolol Succinate 50 Mg Tb24 (Metoprolol succinate) .Marland Kitchen... Take one by mouth every am  Problem # 5:  DIABETES MELLITUS, TYPE II (ICD-250.00) controlled.  no change in meds.  His updated medication list for this problem includes:    Actoplus Met 15-500 Mg Tabs (Pioglitazone hcl-metformin hcl) ..... One tab by mouth two times a day  Problem # 6:  HYPERLIPIDEMIA (ICD-272.4) controlled. no change in meds.  His updated medication list for this problem includes:    Vytorin 10-40 Mg Tabs (Ezetimibe-simvastatin) .Marland Kitchen... Take one by mouth at bedtime  Problem # 7:  ATRIAL FIBRILLATION (ICD-427.31) No tachycardia and feeling well . His updated medication list for this problem includes:    Verapamil Hcl Cr 120 Mg Cr-tabs (Verapamil hcl) ..... One tab by mouth every am    Warfarin Sodium 5 Mg Tabs (Warfarin sodium) .Marland Kitchen... Take one by mouth every day    Metoprolol Succinate 50 Mg Tb24 (Metoprolol succinate) .Marland Kitchen... Take one by mouth every am  Problem # 8:  GOUT (ICD-274.9) See above.  His updated medication list for this problem includes:    Allopurinol 300 Mg Tabs (Allopurinol) .Marland Kitchen... 1.5 tab by mouth once daily  Complete Medication List: 1)  Verapamil Hcl Cr 120 Mg Cr-tabs (Verapamil hcl) .... One tab by mouth every am 2)  Warfarin Sodium 5 Mg Tabs (Warfarin sodium) .... Take one by mouth every day 3)  Metoprolol Succinate 50 Mg Tb24 (Metoprolol succinate) .... Take one by mouth every am 4)  Vytorin 10-40 Mg Tabs (Ezetimibe-simvastatin) .... Take one by mouth at bedtime 5)  Actoplus Met 15-500 Mg Tabs (Pioglitazone hcl-metformin hcl) .... One tab by mouth two times a day 6)  Nystatin-triamcinolone 100000-0.1 Unit/gm-% Oint (Nystatin-triamcinolone) .... Use as directed to  affected area 7)  Allopurinol 300 Mg Tabs (Allopurinol) .... 1.5 tab by mouth once daily 8)  Onetouch Test Strp (Glucose blood) .... Use once daily  Colorectal Screening:  Current Recommendations:    Hemoccult: NEG X 1 today; Given X 3  PSA Screening:    PSA: 0.45  (07/21/2010)  Immunization & Chemoprophylaxis:    Tetanus vaccine: Td  (02/20/2003)  Influenza vaccine: Fluvax 3+  (06/26/2010)    Pneumovax: Pneumovax  (07/17/2009)  Patient Instructions: 1)  I want to recheck your labs in 6 months.  2)  cmet/A1c, MALB 250.00 3)  30 min office visit a few days later.  4)  Increase to 1.5 tabs of allopurinol a day when your foot stops hurting.  Let me know if you continue to have painful episodes.  5)  Take care.  Prescriptions: ONETOUCH TEST  STRP (GLUCOSE BLOOD) Use once daily  #100 x 4   Entered and Authorized by:   Crawford Givens MD   Signed by:   Crawford Givens MD on 07/24/2010   Method used:   Print then Give to Patient   RxID:   [id number] ACTOPLUS MET 15-500 MG TABS (PIOGLITAZONE HCL-METFORMIN HCL) one tab by mouth two times a day  #180 x 3   Entered and Authorized by:   Crawford Givens MD   Signed by:   Crawford Givens MD on 07/24/2010   Method used:   Print then Give to Patient   RxID:   [id number] VYTORIN 10-40 MG  TABS (EZETIMIBE-SIMVASTATIN) Take one by mouth at bedtime  #90 x 3   Entered and Authorized by:   Crawford Givens MD   Signed by:   Crawford Givens MD on 07/24/2010   Method used:   Print then Give to Patient   RxID:   [id number] METOPROLOL SUCCINATE 50 MG  TB24 (METOPROLOL SUCCINATE) Take one by mouth every am  #90 x 3   Entered and Authorized by:   Crawford Givens MD   Signed by:   Crawford Givens MD on 07/24/2010   Method used:   Print then Give to Patient   RxID:   [id number] WARFARIN SODIUM 5 MG TABS (WARFARIN SODIUM) Take one by mouth every day  #90 x 3   Entered and Authorized by:   Crawford Givens MD   Signed by:   Crawford Givens MD on  07/24/2010   Method used:   Print then Give to Patient   RxID:   [id number] VERAPAMIL HCL CR 120 MG CR-TABS (VERAPAMIL HCL) one tab by mouth every AM  #90 x 3   Entered and Authorized by:   Crawford Givens MD   Signed by:   Crawford Givens MD on 07/24/2010   Method used:   Print then Give to Patient   RxID:   [id number] ALLOPURINOL 300 MG TABS (ALLOPURINOL) 1.5 tab by mouth once daily  #135 x 3   Entered and Authorized by:   Crawford Givens MD   Signed by:   Crawford Givens MD on 07/24/2010   Method used:   Print then Give to Patient   RxID:   [id number]    Orders Added: 1)  Est. Patient Level IV [06269]    Current Allergies (reviewed today): ! CHANTIX ! LISINOPRIL

## 2010-10-22 NOTE — Medication Information (Signed)
Summary: Interaction Between Simvastatin & Verapamil/[employer]  Interaction Between Simvastatin & Verapamil/[employer]   Imported By: Lanelle Bal 12/26/2009 12:51:25  _____________________________________________________________________  External Attachment:    Type:   Image     Comment:   External Document

## 2010-10-22 NOTE — Assessment & Plan Note (Signed)
Summary: F/U AFTER LABS / LFW   Vital Signs:  Patient profile:   75 year old male Height:      68.50 inches Weight:      214.25 pounds BMI:     32.22 Temp:     97.7 degrees F oral Pulse rate:   68 / minute Pulse rhythm:   regular BP sitting:   116 / 60  (left arm) Cuff size:   large  Vitals Entered By: Delilah Shan CMA Duncan Dull) (May 20, 2010 8:13 AM) CC: 3 month follow up after labs   History of Present Illness: Hypertension:      Using medication without problems or lightheadedness: yes Chest pain with exertion:no Edema: occ bilateral lower extremity edema at ankles Short of breath:no Average home BPs: check occ and similar today Other issues: K was high and lisinopril was held.  needs repeat K today  Diabetes:  Using medications without difficulties: yes Hypoglycemic episodes:no Hyperglycemic episodes:no Feet problems:no Blood Sugars averaging: 90s-110s, usually  ~100 eye exam within last year: yes, in the spring 2011  hyperkalemia- due for recheck today.  Not on high potassium diet.  ACE held.  Feeling well o/w.   Current Medications (verified): 1)  Verapamil Hcl Cr 120 Mg Cr-Tabs (Verapamil Hcl) .... One Tab By Mouth Every Am 2)  Warfarin Sodium 5 Mg Tabs (Warfarin Sodium) .... Take One By Mouth Every Day But Take 1 and 1/2 On Thurs. 3)  Metoprolol Succinate 50 Mg  Tb24 (Metoprolol Succinate) .... Take One By Mouth Every Am 4)  Vytorin 10-40 Mg  Tabs (Ezetimibe-Simvastatin) .... Take One By Mouth At Bedtime 5)  Lisinopril 5 Mg Tabs (Lisinopril) .... Hold  One Tab By Mouth Once Daily 6)  Actoplus Met 15-500 Mg Tabs (Pioglitazone Hcl-Metformin Hcl) .... One Tab By Mouth Two Times A Day 7)  Nystatin-Triamcinolone 100000-0.1 Unit/gm-% Oint (Nystatin-Triamcinolone) .... Use As Directed To Affected Area 8)  Allopurinol 300 Mg Tabs (Allopurinol) .... One Tab By Mouth Once Daily 9)  Metrogel-Vaginal 0.75 % Gel (Metronidazole) .... Apply To Nose Daily 10)  Onetouch  Test  Strp (Glucose Blood) .... Use Once Daily  Allergies: 1)  ! Chantix 2)  ! 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  Otherwise negative.    Physical Exam  General:  GEN: nad, alert and oriented HEENT: mucous membranes moist NECK: supple w/o LA CV: regular rate and rhythm  PULM: ctab, no inc wob ABD: soft, +bs EXT: no edema SKIN: no acute rash    Impression & Recommendations:  Problem # 1:  HYPERKALEMIA (ICD-276.7) Recheck and  contact with results.  Orders: TLB-Potassium (K+) (84132-K)  Problem # 2:  ESSENTIAL HYPERTENSION, BENIGN (ICD-401.1) No other change in meds. Controlled.  The following medications were removed from the medication list:    Lisinopril 5 Mg Tabs (Lisinopril) ..... Hold  one tab by mouth once daily His updated medication list for this problem includes:    Verapamil Hcl Cr 120 Mg Cr-tabs (Verapamil hcl) ..... One tab by mouth every am    Metoprolol  Succinate 50 Mg Tb24 (Metoprolol succinate) .Marland Kitchen... Take one by mouth every am  Problem # 3:  DIABETES MELLITUS, TYPE II (ICD-250.00) Controlled, return for labs on routine basis.  The following medications were removed from the medication list:    Lisinopril 5 Mg Tabs (Lisinopril) ..... Hold  one tab by mouth once daily His updated medication list for this problem includes:    Actoplus Met 15-500 Mg Tabs (Pioglitazone hcl-metformin hcl) ..... One tab by mouth two times a day  Complete Medication List: 1)  Verapamil Hcl Cr 120 Mg Cr-tabs (Verapamil hcl) .... One tab by mouth every am 2)  Warfarin Sodium 5 Mg Tabs (Warfarin sodium) .... Take one by mouth every day but take 1 and 1/2 on thurs. 3)  Metoprolol Succinate 50 Mg Tb24 (Metoprolol succinate) .... Take one by mouth every am 4)  Vytorin 10-40 Mg Tabs (Ezetimibe-simvastatin) .... Take one by mouth at bedtime 5)  Actoplus Met 15-500 Mg Tabs (Pioglitazone hcl-metformin hcl) .... One tab by mouth two times a day 6)  Nystatin-triamcinolone 100000-0.1 Unit/gm-% Oint (Nystatin-triamcinolone) .... Use as directed to affected area 7)  Allopurinol 300 Mg Tabs (Allopurinol) .... One tab by mouth once daily 8)  Metrogel-vaginal 0.75 % Gel (Metronidazole) .... Apply to nose daily 9)  Onetouch Test Strp (Glucose blood) .... Use once daily  Patient Instructions: 1)  Return for physical in November. 2)  Labs before physical- fasting. 3)  CMET/lipid/MALB/A1c---250.00 4)  PSA v76.44 5)  We'll contact you with your lab report from today.  Don't take the lisinopril in the meantime.   Current Allergies (reviewed today): ! CHANTIX ! LISINOPRIL

## 2010-10-23 NOTE — Letter (Signed)
Summary: Information on Dr. Ines Bloomer at [name]  Information on Dr. Ines Bloomer at [facility]   Imported By: Maryln Gottron 10/10/2010 14:58:24  _____________________________________________________________________  External Attachment:    Type:   Image     Comment:   External Document

## 2010-10-23 NOTE — Assessment & Plan Note (Signed)
Summary: FOOT PAIN/ lb   Vital Signs:  Patient profile:   75 year old male Height:      68.50 inches Weight:      218.50 pounds BMI:     32.86 Temp:     97.5 degrees F oral Pulse rate:   80 / minute Pulse rhythm:   regular BP sitting:   102 / 56  (left arm) Cuff size:   large  Vitals Entered By: Delilah Shan CMA Emilian Stawicki Dull) (October 01, 2010 2:29 PM) CC: Foot pain - off meds preparing for eye surgery.   History of Present Illness: Surgery planned for Friday for eye lids.  Off coumadin in meantime, since Sunday.  B foot pain, lateral foot pain, worse as the week went on.  Pain with standing.  Had prev had steroid shots in foot, is going tomorrow for consideration of injection at [facility].    Sees Dr. Newt Lukes.  Wanted to make sure the above wouldn't interfere with the surgery.    Allergies: 1)  ! Chantix 2)  ! Lisinopril  Review of Systems       See HPI.  Otherwise negative.    Physical Exam  General:  no apparent distress feet w/o abnormality on inspection, normal pulses.  No erythema, no edema.  Not tender to palpation except along the proximal/lateral 5th MT bilaterally.  Pain extends along the tendon sheath.  pain with resisted lateral movement of foot, but not plantar flexion or medial movement.    Impression & Recommendations:  Problem # 1:  FOOT PAIN, BILATERAL (ICD-729.5) He likely has a bilateral tendonitis.  I think it would be reasonable to follow up with the foot clinic.  I don't think that a steroid injection locally would affect his surgery status, but I'll defer to the surgery team.  No new pain meds in meantime.  He is off nsaids and coumadin.  I told him that we'd send his not to Dr. Newt Lukes.    Complete Medication List: 1)  Verapamil Hcl Cr 120 Mg Cr-tabs (Verapamil hcl) .... One tab by mouth every am 2)  Warfarin Sodium 5 Mg Tabs (Warfarin sodium) .... Take one by mouth every day 3)  Metoprolol Succinate 50 Mg Tb24 (Metoprolol succinate) .... Take  one by mouth every am 4)  Vytorin 10-40 Mg Tabs (Ezetimibe-simvastatin) .... Take one by mouth at bedtime 5)  Actoplus Met 15-500 Mg Tabs (Pioglitazone hcl-metformin hcl) .... One tab by mouth two times a day 6)  Nystatin-triamcinolone 100000-0.1 Unit/gm-% Oint (Nystatin-triamcinolone) .... Use as directed to affected area 7)  Allopurinol 300 Mg Tabs (Allopurinol) .... 1.5 tab by mouth once daily 8)  Onetouch Test Strp (Glucose blood) .... Use once daily  Patient Instructions: 1)  I would talk to the Foot center tomorrow and I'll send a note to Dr. Newt Lukes. Take care.    Orders Added: 1)  Est. Patient Level III [95638]    Current Allergies (reviewed today): ! CHANTIX ! LISINOPRIL

## 2010-11-05 ENCOUNTER — Encounter: Payer: Self-pay | Admitting: Family Medicine

## 2010-11-13 ENCOUNTER — Encounter: Payer: Self-pay | Admitting: Family Medicine

## 2010-11-13 ENCOUNTER — Ambulatory Visit (INDEPENDENT_AMBULATORY_CARE_PROVIDER_SITE_OTHER): Payer: Medicare Other

## 2010-11-13 DIAGNOSIS — I4891 Unspecified atrial fibrillation: Secondary | ICD-10-CM

## 2010-11-13 DIAGNOSIS — Z7901 Long term (current) use of anticoagulants: Secondary | ICD-10-CM

## 2010-11-13 DIAGNOSIS — Z5181 Encounter for therapeutic drug level monitoring: Secondary | ICD-10-CM

## 2010-11-13 LAB — CONVERTED CEMR LAB
INR: 3.4
Prothrombin Time: 40.5 s

## 2010-11-18 NOTE — Medication Information (Signed)
Summary: protime/tw   Indication 1: Atrial fibrillation PT 40.5 INR RANGE 2.0-3.5           Allergies: 1)  ! Chantix 2)  ! Lisinopril  Anticoagulation Management History:      Positive risk factors for bleeding include an age of 22 years or older and presence of serious comorbidities.  The bleeding index is 'intermediate risk'.  Positive CHADS2 values include History of HTN and History of Diabetes.  Negative CHADS2 values include Age > 73 years old.  His last INR was 2.3 and today's INR is 3.4.  Prothrombin time is 40.5.    Anticoagulation Management Assessment/Plan:      The patient's current anticoagulation dose is Warfarin sodium 5 mg tabs: Take one by mouth every day.  The next INR is due 4 weeks.         ANTICOAGULATION RECORD PREVIOUS REGIMEN & LAB RESULTS Anticoagulation Diagnosis:  Atrial fibrillation on  10/10/2009 Previous INR Goal Range:  2.0-3.5 on  10/10/2009 Previous INR:  2.3 on  10/16/2010 Previous Coumadin Dose(mg):   10 mg x 2 days then resume 5mg  daily  on  08/21/2010 Previous Regimen:  5mg  qd, 7.5mg  T,TH on  09/04/2010 Previous Coagulation Comments:  . on  08/21/2010  NEW REGIMEN & LAB RESULTS Current INR: 3.4 Current Coumadin Dose(mg):  5mg  qd, 7.5mg  T,TH Regimen:  5mg  qd, 7.5mg  T,TH  Provider: schaller Repeat testing in: 4 weeks Dose has been reviewed with patient or caretaker during this visit. Reviewed by: Celso Sickle  Anticoagulation Visit Questionnaire Coumadin dose missed/changed:  No Abnormal Bleeding Symptoms:  No  Any diet changes including alcohol intake, vegetables or greens since the last visit:  No Any illnesses or hospitalizations since the last visit:  No Any signs of clotting since the last visit (including chest discomfort, dizziness, shortness of breath, arm tingling, slurred speech, swelling or redness in leg):  No  MEDICATIONS VERAPAMIL HCL CR 120 MG CR-TABS (VERAPAMIL HCL) one tab by mouth every AM WARFARIN SODIUM 5  MG TABS (WARFARIN SODIUM) Take one by mouth every day METOPROLOL SUCCINATE 50 MG  TB24 (METOPROLOL SUCCINATE) Take one by mouth every am VYTORIN 10-40 MG  TABS (EZETIMIBE-SIMVASTATIN) Take one by mouth at bedtime ACTOPLUS MET 15-500 MG TABS (PIOGLITAZONE HCL-METFORMIN HCL) one tab by mouth two times a day NYSTATIN-TRIAMCINOLONE 100000-0.1 UNIT/GM-% OINT (NYSTATIN-TRIAMCINOLONE) use as directed to affected area ALLOPURINOL 300 MG TABS (ALLOPURINOL) 1.5 tab by mouth once daily ONETOUCH TEST  STRP (GLUCOSE BLOOD) Use once daily    Laboratory Results   Blood Tests   Date/Time Received: November 13, 2010 11:02 AM Date/Time Reported: November 13, 2010 11:02 AM  PT: 40.5 s   (Normal Range: 10.6-13.4)  INR: 3.4   (Normal Range: 0.88-1.12   Therap INR: 2.0-3.5)

## 2010-11-20 ENCOUNTER — Encounter: Payer: Self-pay | Admitting: Family Medicine

## 2010-11-20 ENCOUNTER — Other Ambulatory Visit: Payer: Self-pay | Admitting: Rheumatology

## 2010-11-27 NOTE — Letter (Signed)
Summary: [facility]  [facility]   Imported By: Kassie Mends 11/18/2010 10:15:23  _____________________________________________________________________  External Attachment:    Type:   Image     Comment:   External Document  Appended Document: [facility]    Clinical Lists Changes  Observations: Added new observation of PAST MED HX: Atrial fibrillation (01/20/2003) Diabetes mellitus, type II (09/21/1992) Gout (04/22/2003) per Dr. Gavin Potters Hyperlipidemia (05/22/2001) R knee pain- [facility] (11/18/2010 20:39)       Past History:  Past Medical History: Atrial fibrillation (01/20/2003) Diabetes mellitus, type II (09/21/1992) Gout (04/22/2003) per Dr. Gavin Potters Hyperlipidemia (05/22/2001) R knee pain- [facility]

## 2010-12-02 ENCOUNTER — Encounter: Payer: Self-pay | Admitting: Family Medicine

## 2010-12-02 NOTE — Letter (Signed)
Summary: [facility]  [facility]   Imported By: Maryln Gottron 11/27/2010 13:42:11  _____________________________________________________________________  External Attachment:    Type:   Image     Comment:   External Document  Appended Document: [facility] please call patient and move up his INR lab appointment by 1 week.  thanks.   Appended Document: [facility]    Clinical Lists Changes  Observations: Added new observation of PAST MED HX: Atrial fibrillation (01/20/2003) Diabetes mellitus, type II (09/21/1992) Gout (04/22/2003) per Dr. [facility] Hyperlipidemia (05/22/2001) R knee pain- [facility], Dr. [facility] with rheum (11/27/2010 13:55)       Past History:  Past Medical History: Atrial fibrillation (01/20/2003) Diabetes mellitus, type II (09/21/1992) Gout (04/22/2003) per Dr. [facility] Hyperlipidemia (05/22/2001) R knee pain- [facility], Dr. [facility] with rheum  Appended Document: [facility] Left message on voicemail  to return call.   Appended Document: [facility] Patient Advised.   Rescheduled.

## 2010-12-04 ENCOUNTER — Encounter: Payer: Self-pay | Admitting: Family Medicine

## 2010-12-04 ENCOUNTER — Ambulatory Visit (INDEPENDENT_AMBULATORY_CARE_PROVIDER_SITE_OTHER): Payer: Medicare Other

## 2010-12-04 DIAGNOSIS — Z7901 Long term (current) use of anticoagulants: Secondary | ICD-10-CM

## 2010-12-04 DIAGNOSIS — I4891 Unspecified atrial fibrillation: Secondary | ICD-10-CM

## 2010-12-04 DIAGNOSIS — Z5181 Encounter for therapeutic drug level monitoring: Secondary | ICD-10-CM

## 2010-12-04 LAB — CONVERTED CEMR LAB
INR: 2
Prothrombin Time: 24 s

## 2010-12-09 NOTE — Medication Information (Signed)
Summary: protime/tsc   Indication 1: Atrial fibrillation PT 24.0 INR RANGE 2.0-3.5           Allergies: 1)  ! Chantix 2)  ! Lisinopril  Anticoagulation Management History:      Positive risk factors for bleeding include an age of 34 years or older and presence of serious comorbidities.  The bleeding index is 'intermediate risk'.  Positive CHADS2 values include History of HTN and History of Diabetes.  Negative CHADS2 values include Age > 73 years old.  His last INR was 3.4 and today's INR is 2.0.  Prothrombin time is 24.0.    Anticoagulation Management Assessment/Plan:      The patient's current anticoagulation dose is Warfarin sodium 5 mg tabs: Take one by mouth every day.  The next INR is due 4 weeks.        Laboratory Results   Blood Tests   Date/Time Recieved: December 04, 2010 10:31 AM  Date/Time Reported: December 04, 2010 10:31 AM   PT: 24.0 s   (Normal Range: 10.6-13.4)  INR: 2.0   (Normal Range: 0.88-1.12   Therap INR: 2.0-3.5)      ANTICOAGULATION RECORD PREVIOUS REGIMEN & LAB RESULTS Anticoagulation Diagnosis:  Atrial fibrillation on  10/10/2009 Previous INR Goal Range:  2.0-3.5 on  10/10/2009 Previous INR:  3.4 on  11/13/2010 Previous Coumadin Dose(mg):   5mg  qd, 7.5mg  T,TH on  11/13/2010 Previous Regimen:   5mg  qd, 7.5mg  T,TH on  11/13/2010 Previous Coagulation Comments:  . on  08/21/2010  NEW REGIMEN & LAB RESULTS Current INR: 2.0 Regimen:  5mg  qd, 7.5mg  T,TH  (no change) Coagulation Comments: missed a 7.5mg  dose Provider: Salvatrice Morandi      Repeat testing in: 4 weeks MEDICATIONS VERAPAMIL HCL CR 120 MG CR-TABS (VERAPAMIL HCL) one tab by mouth every AM WARFARIN SODIUM 5 MG TABS (WARFARIN SODIUM) Take one by mouth every day METOPROLOL SUCCINATE 50 MG  TB24 (METOPROLOL SUCCINATE) Take one by mouth every am VYTORIN 10-40 MG  TABS (EZETIMIBE-SIMVASTATIN) Take one by mouth at bedtime ACTOPLUS MET 15-500 MG TABS (PIOGLITAZONE HCL-METFORMIN HCL) one tab by mouth  two times a day NYSTATIN-TRIAMCINOLONE 100000-0.1 UNIT/GM-% OINT (NYSTATIN-TRIAMCINOLONE) use as directed to affected area ALLOPURINOL 300 MG TABS (ALLOPURINOL) 1.5 tab by mouth once daily ONETOUCH TEST  STRP (GLUCOSE BLOOD) Use once daily  Dose has been reviewed with patient or caretaker during this visit.  Reviewed by: Allison Quarry  Anticoagulation Visit Questionnaire      Coumadin dose missed/changed:  Yes      Coumadin Dose Comments:  one or more missed dose(s)      Abnormal Bleeding Symptoms:  No Any diet changes including alcohol intake, vegetables or greens since the last visit:  No Any illnesses or hospitalizations since the last visit:  No Any signs of clotting since the last visit (including chest discomfort, dizziness, shortness of breath, arm tingling, slurred speech, swelling or redness in leg):  No

## 2010-12-11 ENCOUNTER — Ambulatory Visit: Payer: Medicare Other

## 2010-12-15 ENCOUNTER — Encounter: Payer: Self-pay | Admitting: Family Medicine

## 2010-12-15 ENCOUNTER — Ambulatory Visit (INDEPENDENT_AMBULATORY_CARE_PROVIDER_SITE_OTHER): Payer: Medicare Other | Admitting: Family Medicine

## 2010-12-15 ENCOUNTER — Ambulatory Visit (INDEPENDENT_AMBULATORY_CARE_PROVIDER_SITE_OTHER)
Admission: RE | Admit: 2010-12-15 | Discharge: 2010-12-15 | Disposition: A | Discharge status: Home / Self Care | Payer: Medicare Other | Source: Ambulatory Visit | Attending: Family Medicine | Admitting: Family Medicine

## 2010-12-15 VITALS — BP 124/80 | HR 80 | Temp 98.0°F | Ht 69.0 in | Wt 213.8 lb

## 2010-12-15 DIAGNOSIS — R071 Chest pain on breathing: Secondary | ICD-10-CM

## 2010-12-15 DIAGNOSIS — R0789 Other chest pain: Secondary | ICD-10-CM

## 2010-12-15 MED ORDER — HYDROCODONE-ACETAMINOPHEN 5-325 MG PO TABS: ORAL_TABLET | ORAL | Status: AC

## 2010-12-15 NOTE — Progress Notes (Signed)
Fell yesterday.  No LOC.  Tripped going through a door and onto the grass outside.  R chest pain- point tender since then.  Breathing is okay except for deep breath.  No FCNAV.  On coumadin.  No bleeding, other than bruising as notes.    ROS: See HPI, otherwise noncontributory.  Meds, vitals, and allergies reviewed.   NAD R side of face bruised but not ttp.  NCAT o/w. Tm wnl and op wnl. Neck supple IRR, not tachy ctab R side of chest bruised and is point tender.   R arm bruised Spine not ttp

## 2010-12-15 NOTE — Assessment & Plan Note (Signed)
CXR w/o fx seen on initial read.  Will await overread.  D/w pt about sedation caution on meds.  I would use the vicodin prn.  This should gradually resolve.  Recent INR was 2.  No other change in meds.  He agrees to fu prn.

## 2010-12-15 NOTE — Progress Notes (Signed)
Patient's wife notified as instructed by telephone. 

## 2010-12-15 NOTE — Patient Instructions (Signed)
Use the vicodin as needed for pain.  It can make you drowsy.  Take care.

## 2010-12-18 ENCOUNTER — Telehealth: Payer: Self-pay | Admitting: Radiology

## 2010-12-18 DIAGNOSIS — I4891 Unspecified atrial fibrillation: Secondary | ICD-10-CM

## 2010-12-18 DIAGNOSIS — Z7901 Long term (current) use of anticoagulants: Secondary | ICD-10-CM

## 2010-12-18 DIAGNOSIS — Z5181 Encounter for therapeutic drug level monitoring: Secondary | ICD-10-CM

## 2010-12-18 NOTE — Telephone Encounter (Signed)
Please sign and close standing order for INR  

## 2011-01-01 ENCOUNTER — Ambulatory Visit (INDEPENDENT_AMBULATORY_CARE_PROVIDER_SITE_OTHER): Payer: Medicare Other | Admitting: Family Medicine

## 2011-01-01 DIAGNOSIS — Z7901 Long term (current) use of anticoagulants: Secondary | ICD-10-CM

## 2011-01-01 DIAGNOSIS — I4891 Unspecified atrial fibrillation: Secondary | ICD-10-CM

## 2011-01-01 DIAGNOSIS — Z5181 Encounter for therapeutic drug level monitoring: Secondary | ICD-10-CM

## 2011-01-01 LAB — POCT INR: INR: 3.4

## 2011-01-01 NOTE — Patient Instructions (Signed)
Changed dose to 5 mg daily, 7.5mg  Tu. Recheck in 4 weeks

## 2011-01-15 ENCOUNTER — Other Ambulatory Visit: Payer: Self-pay | Admitting: Family Medicine

## 2011-01-20 ENCOUNTER — Other Ambulatory Visit (INDEPENDENT_AMBULATORY_CARE_PROVIDER_SITE_OTHER): Payer: Medicare Other | Admitting: Family Medicine

## 2011-01-20 DIAGNOSIS — E119 Type 2 diabetes mellitus without complications: Secondary | ICD-10-CM

## 2011-01-20 LAB — COMPREHENSIVE METABOLIC PANEL
ALT: 20 U/L (ref 0–53)
AST: 16 U/L (ref 0–37)
Albumin: 3.4 g/dL — ABNORMAL LOW (ref 3.5–5.2)
Alkaline Phosphatase: 57 U/L (ref 39–117)
BUN: 18 mg/dL (ref 6–23)
CO2: 27 mEq/L (ref 19–32)
Calcium: 9 mg/dL (ref 8.4–10.5)
Chloride: 107 mEq/L (ref 96–112)
Creatinine, Ser: 1.2 mg/dL (ref 0.4–1.5)
GFR: 60.77 mL/min (ref >60.00)
Glucose, Bld: 118 mg/dL — ABNORMAL HIGH (ref 70–99)
Potassium: 4.8 mEq/L (ref 3.5–5.1)
Sodium: 141 mEq/L (ref 135–145)
Total Bilirubin: 0.6 mg/dL (ref 0.3–1.2)
Total Protein: 5.7 g/dL — ABNORMAL LOW (ref 6.0–8.3)

## 2011-01-20 LAB — HEMOGLOBIN A1C: Hgb A1c MFr Bld: 6.6 % — ABNORMAL HIGH (ref 4.6–6.5)

## 2011-01-21 LAB — MICROALBUMIN / CREATININE URINE RATIO
Creatinine,U: 186.9 mg/dL
Microalb Creat Ratio: 61.8 mg/g — ABNORMAL HIGH (ref 0.0–30.0)
Microalb, Ur: 115.5 mg/dL — ABNORMAL HIGH (ref 0.0–1.9)

## 2011-01-26 ENCOUNTER — Ambulatory Visit (INDEPENDENT_AMBULATORY_CARE_PROVIDER_SITE_OTHER): Payer: Medicare Other | Admitting: Family Medicine

## 2011-01-26 ENCOUNTER — Encounter: Payer: Self-pay | Admitting: Family Medicine

## 2011-01-26 VITALS — BP 126/70 | HR 84 | Temp 97.6°F | Wt 210.1 lb

## 2011-01-26 DIAGNOSIS — F17200 Nicotine dependence, unspecified, uncomplicated: Secondary | ICD-10-CM

## 2011-01-26 DIAGNOSIS — Z7901 Long term (current) use of anticoagulants: Secondary | ICD-10-CM

## 2011-01-26 DIAGNOSIS — Z5181 Encounter for therapeutic drug level monitoring: Secondary | ICD-10-CM

## 2011-01-26 DIAGNOSIS — I4891 Unspecified atrial fibrillation: Secondary | ICD-10-CM

## 2011-01-26 DIAGNOSIS — M109 Gout, unspecified: Secondary | ICD-10-CM

## 2011-01-26 DIAGNOSIS — N259 Disorder resulting from impaired renal tubular function, unspecified: Secondary | ICD-10-CM

## 2011-01-26 DIAGNOSIS — E119 Type 2 diabetes mellitus without complications: Secondary | ICD-10-CM

## 2011-01-26 LAB — POCT INR: INR: 2.2

## 2011-01-26 NOTE — Patient Instructions (Addendum)
Go see Terri in the lab about your coumadin. See Shirlee Limerick about your referral before your leave today. Take care.  I would schedule a physical in 6 months.  Glad to see you today.

## 2011-01-26 NOTE — Progress Notes (Signed)
AF on coumadin.  Bruising noted by patient.  No bleeding.  Due for INR.  Hadn't had fu with cards recently.  No CP, not sob, no tachycardia.  Minimal ble edema.  We talked about reest with cards.    Diabetes:  Using medications without difficulties:yes Hypoglycemic episodes:no Hyperglycemic episodes:no Feet problems: had gout flare but o/w doing well Blood Sugars averaging: checked about 1/week.  102 this AM.   eye exam within last year: March 2012, has fu in 3-4 months.  PMH and SH reviewed  Meds, vitals, and allergies reviewed.   ROS: See HPI.  Otherwise negative.    GEN: nad, alert and oriented HEENT: mucous membranes moist NECK: supple w/o LA CV: IRR, not tachy PULM: ctab, no inc wob ABD: soft, +bs EXT: trace edema SKIN: no acute rash but bruises notes on arms  Diabetic foot exam: Normal inspection No skin breakdown No calluses  Normal DP pulses Normal sensation to light tough and monofilament Nails thickened Resolving erythema from gout flare on L 3rd toe

## 2011-01-26 NOTE — Assessment & Plan Note (Addendum)
I would not start ACE given the h/o hyperK.  D/w pt.  Will monitor BP and DM2.  >25 min spent with face to face with patient counseling.

## 2011-01-26 NOTE — Assessment & Plan Note (Addendum)
I would like cardiac input, if only to est care with cards.  He agrees to referral.  See lab note re: coumadin.

## 2011-01-26 NOTE — Assessment & Plan Note (Signed)
A1c controlled.  We talked about actos.  At this point, there is no clear evidence to direct a change in meds (re: actos).  Med is tolerated (no h/o bladder CA and no h/o blood in urine) and benefit from control of DM2 outweighs other considerations.  Pt agrees to continue actos.

## 2011-01-26 NOTE — Assessment & Plan Note (Signed)
D/w pt about stopping smoking.

## 2011-01-26 NOTE — Assessment & Plan Note (Signed)
Per rheum 

## 2011-01-26 NOTE — Patient Instructions (Signed)
Continue current dose, check in 4 weeks  

## 2011-01-29 ENCOUNTER — Ambulatory Visit: Payer: Medicare Other

## 2011-02-03 ENCOUNTER — Ambulatory Visit (INDEPENDENT_AMBULATORY_CARE_PROVIDER_SITE_OTHER): Payer: Medicare Other | Admitting: Cardiovascular Disease

## 2011-02-03 ENCOUNTER — Encounter: Payer: Self-pay | Admitting: Cardiovascular Disease

## 2011-02-03 ENCOUNTER — Other Ambulatory Visit: Payer: Self-pay | Admitting: *Deleted

## 2011-02-03 DIAGNOSIS — I479 Paroxysmal tachycardia, unspecified: Secondary | ICD-10-CM

## 2011-02-03 DIAGNOSIS — E119 Type 2 diabetes mellitus without complications: Secondary | ICD-10-CM

## 2011-02-03 DIAGNOSIS — I10 Essential (primary) hypertension: Secondary | ICD-10-CM

## 2011-02-03 DIAGNOSIS — N259 Disorder resulting from impaired renal tubular function, unspecified: Secondary | ICD-10-CM

## 2011-02-03 DIAGNOSIS — I4891 Unspecified atrial fibrillation: Secondary | ICD-10-CM

## 2011-02-03 DIAGNOSIS — F17200 Nicotine dependence, unspecified, uncomplicated: Secondary | ICD-10-CM

## 2011-02-03 DIAGNOSIS — E785 Hyperlipidemia, unspecified: Secondary | ICD-10-CM

## 2011-02-03 MED ORDER — WARFARIN SODIUM 5 MG PO TABS: 5.0000 mg | ORAL_TABLET | Freq: Every day | ORAL | Status: DC

## 2011-02-03 NOTE — Progress Notes (Signed)
   Patient ID: Benjamin Soto, male    DOB: [birth year], 75 y.o.   MRN: [id number]  HPI Comments: Mr. Calico is a pleasant 75 year old gentleman with a history of chronic atrial fibrillation, remote history of SVT, with severe arthritis in his knees, who has been on warfarin for at least 5 years, Also with a history of diabetes, hyperlipidemia, Who presents to establish care.  Overall, he reports that he has been doing well. He is relatively active though is limited secondary to his chronic knee pain. He denies any shortness of breath and his wife reports that he has been doing well. She has noticed some upper airway wheezing recently. He does smoke a pipe and occasional cigarettes for many years. He denies any significant lower extremity edema.. He's never had any other heart problems as far as he knows but does report a remote history of SVT requiring what sounds like adenosine.  He does report a remote history of low testosterone and was previously on testosterone supplemental cream. He is uncertain when this was discontinued.  Labs show total cholesterol less than 100, LDL in the 40 range  EKG today shows atrial fibrillation with ventricular rate 100 beats per minute, nonspecific ST changes     Review of Systems  Constitutional: Negative.   HENT: Negative.   Eyes: Negative.   Respiratory: Negative.   Cardiovascular: Negative.   Gastrointestinal: Negative.   Musculoskeletal: Positive for arthralgias and gait problem.  Skin: Negative.   Neurological: Negative.   Hematological: Negative.   Psychiatric/Behavioral: Negative.   All other systems reviewed and are negative.   BP 130/80  Pulse 100  Ht 5\' 10"  (1.778 m)  Wt 207 lb (93.895 kg)  BMI 29.70 kg/m2   Physical Exam  Nursing note and vitals reviewed. Constitutional: He is oriented to person, place, and time. He appears well-developed and well-nourished.  HENT:  Head: Normocephalic.  Nose: Nose normal.  Mouth/Throat:  Oropharynx is clear and moist.  Eyes: Conjunctivae are normal. Pupils are equal, round, and reactive to light.  Neck: Normal range of motion. Neck supple. No JVD present.  Cardiovascular: Normal rate, S1 normal, S2 normal, normal heart sounds and intact distal pulses.  An irregularly irregular rhythm present. Exam reveals no gallop and no friction rub.   No murmur heard. Pulmonary/Chest: Effort normal and breath sounds normal. No respiratory distress. He has no wheezes. He has no rales. He exhibits no tenderness.  Abdominal: Soft. Bowel sounds are normal. He exhibits no distension. There is no tenderness.  Musculoskeletal: Normal range of motion. He exhibits no edema and no tenderness.  Lymphadenopathy:    He has no cervical adenopathy.  Neurological: He is alert and oriented to person, place, and time. Coordination normal.  Skin: Skin is warm and dry. No rash noted. No erythema.  Psychiatric: He has a normal mood and affect. His behavior is normal. Judgment and thought content normal.           Assessment and Plan

## 2011-02-03 NOTE — Assessment & Plan Note (Signed)
We have encouraged continued exercise, careful diet management in an effort to lose weight. 

## 2011-02-03 NOTE — Assessment & Plan Note (Signed)
Previous history of hyperkalemia, elevated creatinine on one basic metabolic panel. This could have been secondary to dehydration.

## 2011-02-03 NOTE — Assessment & Plan Note (Signed)
Blood pressure is well controlled on today's visit. No changes made to the medications. 

## 2011-02-03 NOTE — Assessment & Plan Note (Signed)
His cholesterol is low. He does take Vytorin. We have asked him to talk with Dr. Para March about his previous history of low testosterone. It sounds from his report that he does have some erectile dysfunction. If in fact he does have a very low testosterone, we could cut his Vytorin in half or Start him on a testosterone supplement/cream.

## 2011-02-03 NOTE — Patient Instructions (Signed)
You are doing well. No medication changes were made. Please call us if you have new issues that need to be addressed before your next appt.  We will call you for a follow up Appt. In 6 months  

## 2011-02-03 NOTE — Assessment & Plan Note (Signed)
He does continue to smoke a pipe as well as cigarettes on an occasional basis. We have encouraged him not to inhale the  smoke.

## 2011-02-03 NOTE — Assessment & Plan Note (Signed)
Currently on warfarin. He does take beta blocker and calcium channel blocker for rate control. Have asked him to monitor his pulse rate at home as it is elevated on today's visit to the clinic. If his rate continues to be elevated, we could increase one of his medications or add digoxin.

## 2011-02-09 ENCOUNTER — Telehealth: Payer: Self-pay | Admitting: Family Medicine

## 2011-02-09 DIAGNOSIS — N529 Male erectile dysfunction, unspecified: Secondary | ICD-10-CM | POA: Insufficient documentation

## 2011-02-09 NOTE — Telephone Encounter (Signed)
Patient recently saw Dr Mariah Milling [facility], he recommended to the patient that next time he is here that you check his testosterone level.  I asked what reason for this lab and all he said was from taking Vytorin? He is scheduled to come for his PT on 02/23/2011 so would like to add this on that day also if you can. The last time it was checked was 10 years ago he said.

## 2011-02-09 NOTE — Telephone Encounter (Signed)
It's ordered for that day.  Just make sure it's an AM sample.  Thanks.

## 2011-02-09 NOTE — Telephone Encounter (Signed)
That should be fine. 

## 2011-02-09 NOTE — Telephone Encounter (Signed)
It is scheduled at 10:45 a.m.  Should it be sooner?

## 2011-02-10 NOTE — Telephone Encounter (Signed)
Patient notified as instructed by telephone. 

## 2011-02-23 ENCOUNTER — Ambulatory Visit (INDEPENDENT_AMBULATORY_CARE_PROVIDER_SITE_OTHER): Payer: Medicare Other | Admitting: Family Medicine

## 2011-02-23 ENCOUNTER — Other Ambulatory Visit: Payer: Medicare Other

## 2011-02-23 DIAGNOSIS — Z5181 Encounter for therapeutic drug level monitoring: Secondary | ICD-10-CM

## 2011-02-23 DIAGNOSIS — Z7901 Long term (current) use of anticoagulants: Secondary | ICD-10-CM

## 2011-02-23 DIAGNOSIS — I4891 Unspecified atrial fibrillation: Secondary | ICD-10-CM

## 2011-02-23 DIAGNOSIS — N529 Male erectile dysfunction, unspecified: Secondary | ICD-10-CM

## 2011-02-23 LAB — TESTOSTERONE: Testosterone: 155.65 ng/dL — ABNORMAL LOW (ref 350.00–890.00)

## 2011-02-23 LAB — POCT INR: INR: 2.2

## 2011-02-23 NOTE — Patient Instructions (Signed)
Continue current dose, check in 4 weeks  

## 2011-02-26 ENCOUNTER — Telehealth: Payer: Self-pay | Admitting: Family Medicine

## 2011-02-26 DIAGNOSIS — E291 Testicular hypofunction: Secondary | ICD-10-CM

## 2011-02-26 NOTE — Telephone Encounter (Signed)
Please have pt cut his vytorin in half and then recheck an AM testosterone level in 1 month.  Thanks.

## 2011-03-01 ENCOUNTER — Encounter: Payer: Self-pay | Admitting: Family Medicine

## 2011-03-01 DIAGNOSIS — M7020 Olecranon bursitis, unspecified elbow: Secondary | ICD-10-CM | POA: Insufficient documentation

## 2011-03-03 NOTE — Telephone Encounter (Signed)
Advised pt, lab appt made.

## 2011-03-23 ENCOUNTER — Ambulatory Visit (INDEPENDENT_AMBULATORY_CARE_PROVIDER_SITE_OTHER): Payer: Medicare Other | Admitting: Family Medicine

## 2011-03-23 DIAGNOSIS — Z7901 Long term (current) use of anticoagulants: Secondary | ICD-10-CM

## 2011-03-23 DIAGNOSIS — I4891 Unspecified atrial fibrillation: Secondary | ICD-10-CM

## 2011-03-23 DIAGNOSIS — Z5181 Encounter for therapeutic drug level monitoring: Secondary | ICD-10-CM

## 2011-03-23 LAB — POCT INR: INR: 2.2

## 2011-03-23 NOTE — Patient Instructions (Signed)
Continue  5 mg daily, 7.5mg  Tu, recheck 4 weeks

## 2011-04-01 ENCOUNTER — Other Ambulatory Visit: Payer: Medicare Other

## 2011-04-02 ENCOUNTER — Telehealth: Payer: Self-pay | Admitting: Family Medicine

## 2011-04-02 ENCOUNTER — Other Ambulatory Visit (INDEPENDENT_AMBULATORY_CARE_PROVIDER_SITE_OTHER): Payer: Medicare Other | Admitting: Family Medicine

## 2011-04-02 DIAGNOSIS — E291 Testicular hypofunction: Secondary | ICD-10-CM

## 2011-04-02 LAB — TESTOSTERONE: Testosterone: 198.53 ng/dL — ABNORMAL LOW (ref 350.00–890.00)

## 2011-04-02 NOTE — Telephone Encounter (Signed)
Please call pt.  Testosterone is still low.  I would offer uro eval for ED and low T, if he desires.  I'll put in the order, please notify Shirlee Limerick to cancel if he doesn't want the referral.  Thanks.

## 2011-04-03 NOTE — Telephone Encounter (Signed)
Message left for patient to return my call.  

## 2011-04-03 NOTE — Telephone Encounter (Signed)
Spoke to Mr Borin, he wants to see [facility], will make appt and send notes.

## 2011-04-20 ENCOUNTER — Ambulatory Visit (INDEPENDENT_AMBULATORY_CARE_PROVIDER_SITE_OTHER): Payer: Medicare Other | Admitting: Family Medicine

## 2011-04-20 DIAGNOSIS — Z7901 Long term (current) use of anticoagulants: Secondary | ICD-10-CM

## 2011-04-20 DIAGNOSIS — Z5181 Encounter for therapeutic drug level monitoring: Secondary | ICD-10-CM

## 2011-04-20 DIAGNOSIS — I4891 Unspecified atrial fibrillation: Secondary | ICD-10-CM

## 2011-04-20 LAB — POCT INR: INR: 2.4

## 2011-04-20 NOTE — Patient Instructions (Signed)
Continue 5 mg daily, 7.5mg  Tu, recheck 4 weeks

## 2011-05-12 ENCOUNTER — Encounter: Payer: Self-pay | Admitting: Family Medicine

## 2011-05-18 ENCOUNTER — Ambulatory Visit: Payer: Medicare Other

## 2011-05-21 ENCOUNTER — Ambulatory Visit (INDEPENDENT_AMBULATORY_CARE_PROVIDER_SITE_OTHER): Payer: Medicare Other | Admitting: Family Medicine

## 2011-05-21 DIAGNOSIS — I4891 Unspecified atrial fibrillation: Secondary | ICD-10-CM

## 2011-05-21 DIAGNOSIS — Z5181 Encounter for therapeutic drug level monitoring: Secondary | ICD-10-CM

## 2011-05-21 DIAGNOSIS — Z7901 Long term (current) use of anticoagulants: Secondary | ICD-10-CM

## 2011-05-21 LAB — POCT INR: INR: 2.7

## 2011-05-21 NOTE — Patient Instructions (Signed)
Continue  5 mg daily, 7.5mg  Tu, recheck 2 weeks

## 2011-06-18 ENCOUNTER — Ambulatory Visit: Payer: Medicare Other

## 2011-06-22 ENCOUNTER — Ambulatory Visit (INDEPENDENT_AMBULATORY_CARE_PROVIDER_SITE_OTHER): Payer: Medicare Other | Admitting: Family Medicine

## 2011-06-22 DIAGNOSIS — Z5181 Encounter for therapeutic drug level monitoring: Secondary | ICD-10-CM

## 2011-06-22 DIAGNOSIS — I4891 Unspecified atrial fibrillation: Secondary | ICD-10-CM

## 2011-06-22 DIAGNOSIS — Z7901 Long term (current) use of anticoagulants: Secondary | ICD-10-CM

## 2011-06-22 LAB — POCT INR: INR: 2.4

## 2011-06-22 NOTE — Patient Instructions (Signed)
Continue  5 mg daily, 7.5mg Tu, recheck 4 weeks 

## 2011-06-26 ENCOUNTER — Encounter: Payer: Self-pay | Admitting: Family Medicine

## 2011-06-26 ENCOUNTER — Ambulatory Visit (INDEPENDENT_AMBULATORY_CARE_PROVIDER_SITE_OTHER): Payer: Medicare Other | Admitting: Family Medicine

## 2011-06-26 VITALS — BP 112/74 | HR 68 | Temp 97.7°F | Wt 202.0 lb

## 2011-06-26 DIAGNOSIS — N529 Male erectile dysfunction, unspecified: Secondary | ICD-10-CM

## 2011-06-26 DIAGNOSIS — E291 Testicular hypofunction: Secondary | ICD-10-CM

## 2011-06-26 LAB — PSA, MEDICARE: PSA: 0.61 ng/mL (ref <=4.00)

## 2011-06-26 NOTE — Patient Instructions (Signed)
You can get your results through our phone system.  Follow the instructions on the blue card. I'll see you in November.

## 2011-06-28 ENCOUNTER — Encounter: Payer: Self-pay | Admitting: Family Medicine

## 2011-06-28 NOTE — Assessment & Plan Note (Signed)
With low T. I talked with pt about replacement briefly.  I would like uro input.  PSA drawn today.  Will plan on pt talking to uro about T therapy assuming PSA wnl.  Will send labs to uro.  App uro help.

## 2011-06-28 NOTE — Progress Notes (Signed)
Here to clarify recent visits and determine plan. Low T.  Prev was on replacement.  Was seen at [facility], noted to have low T. Dec in vytorin didn't affect level.  Was seen at uro and hadn't had PSA done yet.  Asking about options.  He does have ED that didn't respond to viagra.    Meds, vitals, and allergies reviewed.   ROS: See HPI.  Otherwise, noncontributory.  nad ncat IRR but not tachy ctab Ext w/o edema

## 2011-07-07 ENCOUNTER — Ambulatory Visit (INDEPENDENT_AMBULATORY_CARE_PROVIDER_SITE_OTHER): Payer: Medicare Other

## 2011-07-07 DIAGNOSIS — Z23 Encounter for immunization: Secondary | ICD-10-CM

## 2011-07-16 ENCOUNTER — Ambulatory Visit (INDEPENDENT_AMBULATORY_CARE_PROVIDER_SITE_OTHER): Payer: Medicare Other | Admitting: Family Medicine

## 2011-07-16 ENCOUNTER — Encounter: Payer: Self-pay | Admitting: Family Medicine

## 2011-07-16 VITALS — BP 118/72 | HR 72 | Temp 97.5°F | Wt 205.1 lb

## 2011-07-16 DIAGNOSIS — J40 Bronchitis, not specified as acute or chronic: Secondary | ICD-10-CM

## 2011-07-16 DIAGNOSIS — E119 Type 2 diabetes mellitus without complications: Secondary | ICD-10-CM

## 2011-07-16 MED ORDER — HYDROCODONE-HOMATROPINE 5-1.5 MG/5ML PO SYRP: 5.0000 mL | ORAL_SOLUTION | Freq: Four times a day (QID) | ORAL | Status: DC | PRN

## 2011-07-16 MED ORDER — AZITHROMYCIN 250 MG PO TABS: ORAL_TABLET | ORAL | Status: DC

## 2011-07-16 NOTE — Progress Notes (Signed)
duration of symptoms: a few days rhinorrhea:yes congestion:yes ear pain:no sore throat: no cough:yes Myalgias:chest is sore from cough other concerns: no fever, minimal sputum, + sinus pressure Sugar hadn't been elevated recently.   Wife was also sick.    ROS: See HPI.  Otherwise negative.    Meds, vitals, and allergies reviewed.   GEN: nad, alert and oriented HEENT: mucous membranes moist, TM w/o erythema, nasal epithelium injected, OP with cobblestoning NECK: supple w/o LA CV: rrr with occ ectopy PULM: no inc wob but with R > L exp ronchi but no focal dec in BS EXT: no edema

## 2011-07-16 NOTE — Patient Instructions (Addendum)
Come back for fasting labs and your coumadin check with Terri on Monday.   Start the antibiotics today.  Take 2.5mg  of coumadin per day in the meantime.  If you get short of breath, then notify us.   Take the cough syrup as needed in the meantime.  Take care.

## 2011-07-17 ENCOUNTER — Encounter: Payer: Self-pay | Admitting: Family Medicine

## 2011-07-17 DIAGNOSIS — J40 Bronchitis, not specified as acute or chronic: Secondary | ICD-10-CM | POA: Insufficient documentation

## 2011-07-17 NOTE — Assessment & Plan Note (Signed)
Given the exam, cough and ronchi, along with his history, I would treat with zmax and use cough suppressant prn.  He agrees.  Sedation caution.  If worsening, then notify the clinic.  Cut coumadin to 2.5 mg a day in meantime, return for repeat INR Monday.  He agrees.

## 2011-07-20 ENCOUNTER — Encounter: Payer: Self-pay | Admitting: Family Medicine

## 2011-07-20 ENCOUNTER — Ambulatory Visit (INDEPENDENT_AMBULATORY_CARE_PROVIDER_SITE_OTHER): Payer: Medicare Other | Admitting: Family Medicine

## 2011-07-20 ENCOUNTER — Ambulatory Visit: Payer: Medicare Other

## 2011-07-20 ENCOUNTER — Other Ambulatory Visit: Payer: Medicare Other

## 2011-07-20 DIAGNOSIS — Z7901 Long term (current) use of anticoagulants: Secondary | ICD-10-CM

## 2011-07-20 DIAGNOSIS — Z5181 Encounter for therapeutic drug level monitoring: Secondary | ICD-10-CM

## 2011-07-20 DIAGNOSIS — E119 Type 2 diabetes mellitus without complications: Secondary | ICD-10-CM

## 2011-07-20 DIAGNOSIS — I4891 Unspecified atrial fibrillation: Secondary | ICD-10-CM

## 2011-07-20 DIAGNOSIS — J40 Bronchitis, not specified as acute or chronic: Secondary | ICD-10-CM

## 2011-07-20 LAB — LIPID PANEL
Cholesterol: 86 mg/dL (ref 0–200)
HDL: 47.7 mg/dL (ref >39.00)
LDL Cholesterol: 27 mg/dL (ref 0–99)
Total CHOL/HDL Ratio: 2
Triglycerides: 59 mg/dL (ref 0.0–149.0)
VLDL: 11.8 mg/dL (ref 0.0–40.0)

## 2011-07-20 LAB — COMPREHENSIVE METABOLIC PANEL
ALT: 20 U/L (ref 0–53)
AST: 26 U/L (ref 0–37)
Albumin: 3.5 g/dL (ref 3.5–5.2)
Alkaline Phosphatase: 52 U/L (ref 39–117)
BUN: 21 mg/dL (ref 6–23)
CO2: 30 mEq/L (ref 19–32)
Calcium: 8.8 mg/dL (ref 8.4–10.5)
Chloride: 101 mEq/L (ref 96–112)
Creatinine, Ser: 1.1 mg/dL (ref 0.4–1.5)
GFR: 71.95 mL/min (ref >60.00)
Glucose, Bld: 114 mg/dL — ABNORMAL HIGH (ref 70–99)
Potassium: 4 mEq/L (ref 3.5–5.1)
Sodium: 140 mEq/L (ref 135–145)
Total Bilirubin: 1.2 mg/dL (ref 0.3–1.2)
Total Protein: 6.1 g/dL (ref 6.0–8.3)

## 2011-07-20 LAB — HEMOGLOBIN A1C: Hgb A1c MFr Bld: 6.1 % (ref 4.6–6.5)

## 2011-07-20 LAB — MICROALBUMIN / CREATININE URINE RATIO
Creatinine,U: 196.3 mg/dL
Microalb Creat Ratio: 48.8 mg/g — ABNORMAL HIGH (ref 0.0–30.0)
Microalb, Ur: 95.8 mg/dL — ABNORMAL HIGH (ref 0.0–1.9)

## 2011-07-20 LAB — TSH: TSH: 1.91 u[IU]/mL (ref 0.35–5.50)

## 2011-07-20 MED ORDER — BENZONATATE 200 MG PO CAPS: 200.0000 mg | ORAL_CAPSULE | Freq: Three times a day (TID) | ORAL | Status: AC | PRN

## 2011-07-20 NOTE — Patient Instructions (Signed)
I would take tessalon if the cough continues.  You can take plain mucinex with plenty of water for the chest congestion.  Talk to Camelia Eng on the way out today.  Glad to see you.

## 2011-07-20 NOTE — Assessment & Plan Note (Addendum)
Some improvement, continue with current coumadin dose and use mucinex/tessalon prn cough.  Recheck pulse ox 96%.  Okay for outpatient f/u.  Nontoxic.

## 2011-07-20 NOTE — Patient Instructions (Signed)
Check in 1 week, stay on the reduced dose of 2.5 mg this week.

## 2011-07-21 NOTE — Progress Notes (Signed)
Bronchitis f/u.  INR at goal.  Had some bruising.  No active bleeding.  Finished zmax today.  Cough is some better.  His color is better today, wife noted this.  Hycodan didn't help much with the cough over the last few days- the improvement in cough wasn't attributed to the medicine.  Minimal sputum.  No fevers.  Not sob.   Meds, vitals, and allergies reviewed.   ROS: See HPI.  Otherwise, noncontributory.  nad ncat rrr with ectopy noted No inc in wob, no focal dec in BS.  Prev R>L ronchi are improved Ext well perfused.  Bruising noted on arms

## 2011-07-27 ENCOUNTER — Ambulatory Visit (INDEPENDENT_AMBULATORY_CARE_PROVIDER_SITE_OTHER): Payer: Medicare Other | Admitting: Family Medicine

## 2011-07-27 ENCOUNTER — Other Ambulatory Visit: Payer: Medicare Other

## 2011-07-27 DIAGNOSIS — Z5181 Encounter for therapeutic drug level monitoring: Secondary | ICD-10-CM

## 2011-07-27 DIAGNOSIS — Z7901 Long term (current) use of anticoagulants: Secondary | ICD-10-CM

## 2011-07-27 DIAGNOSIS — I4891 Unspecified atrial fibrillation: Secondary | ICD-10-CM

## 2011-07-27 LAB — POCT INR: INR: 1.3

## 2011-07-27 NOTE — Patient Instructions (Signed)
Restart 5 mg daily, 7.5 Tues, recheck 1 week

## 2011-07-28 ENCOUNTER — Other Ambulatory Visit: Payer: Self-pay | Admitting: *Deleted

## 2011-07-28 MED ORDER — WARFARIN SODIUM 5 MG PO TABS: 5.0000 mg | ORAL_TABLET | Freq: Every day | ORAL | Status: DC

## 2011-07-30 ENCOUNTER — Ambulatory Visit (INDEPENDENT_AMBULATORY_CARE_PROVIDER_SITE_OTHER): Payer: Medicare Other | Admitting: Family Medicine

## 2011-07-30 ENCOUNTER — Encounter: Payer: Self-pay | Admitting: Family Medicine

## 2011-07-30 DIAGNOSIS — E785 Hyperlipidemia, unspecified: Secondary | ICD-10-CM

## 2011-07-30 DIAGNOSIS — I4891 Unspecified atrial fibrillation: Secondary | ICD-10-CM

## 2011-07-30 DIAGNOSIS — M109 Gout, unspecified: Secondary | ICD-10-CM

## 2011-07-30 DIAGNOSIS — E119 Type 2 diabetes mellitus without complications: Secondary | ICD-10-CM

## 2011-07-30 DIAGNOSIS — F17200 Nicotine dependence, unspecified, uncomplicated: Secondary | ICD-10-CM

## 2011-07-30 DIAGNOSIS — E7800 Pure hypercholesterolemia, unspecified: Secondary | ICD-10-CM

## 2011-07-30 DIAGNOSIS — I10 Essential (primary) hypertension: Secondary | ICD-10-CM

## 2011-07-30 DIAGNOSIS — Z0000 Encounter for general adult medical examination without abnormal findings: Secondary | ICD-10-CM

## 2011-07-30 DIAGNOSIS — Z7189 Other specified counseling: Secondary | ICD-10-CM

## 2011-07-30 DIAGNOSIS — N259 Disorder resulting from impaired renal tubular function, unspecified: Secondary | ICD-10-CM

## 2011-07-30 MED ORDER — METOPROLOL SUCCINATE ER 50 MG PO TB24: 50.0000 mg | ORAL_TABLET | Freq: Every day | ORAL | Status: DC

## 2011-07-30 MED ORDER — PIOGLITAZONE HCL-METFORMIN HCL 15-500 MG PO TABS: 1.0000 | ORAL_TABLET | Freq: Two times a day (BID) | ORAL | Status: DC

## 2011-07-30 MED ORDER — WARFARIN SODIUM 5 MG PO TABS: 5.0000 mg | ORAL_TABLET | Freq: Every day | ORAL | Status: DC

## 2011-07-30 MED ORDER — VERAPAMIL HCL 120 MG PO TBCR: 120.0000 mg | EXTENDED_RELEASE_TABLET | Freq: Every day | ORAL | Status: DC

## 2011-07-30 MED ORDER — ALLOPURINOL 100 MG PO TABS: 100.0000 mg | ORAL_TABLET | Freq: Every day | ORAL | Status: DC

## 2011-07-30 MED ORDER — ALLOPURINOL 300 MG PO TABS: ORAL_TABLET | ORAL | Status: DC

## 2011-07-30 MED ORDER — SIMVASTATIN 20 MG PO TABS: 20.0000 mg | ORAL_TABLET | Freq: Every day | ORAL | Status: DC

## 2011-07-30 NOTE — Patient Instructions (Addendum)
Check with your insurance to see if they will cover the shingles shot. Drop off a copy of your living will so we can put it in the chart.   Finish the vytorin and then change to plain simvastatin.  Recheck lipids in 2 months at an INR check with Terri.

## 2011-07-30 NOTE — Progress Notes (Signed)
Diabetes:  Using medications without difficulties:yes Hypoglycemic episodes:noo Hyperglycemic episodes:no Feet problems:no Blood Sugars averaging:~100  Elevated Cholesterol: Using medications without problems:yes Muscle aches: no Diet compliance:yes Exercise: some Other complaints:no  Hypogonadism per uro.   PSA wnl mult times here in clinic.    AF.  No CP, SOB.  No heart racing.  Compliant with meds.  Prev bruising on arms resolving.    Gout.  On allopurinol and doing well.   No pain, no recent flares  Smoking.  We talked about quitting.  Intolerant of chantix.  "I have to make up my mind."  He did quit briefly with recent illness but restarted now.    Advance directive.  D/w pt.  He has one done, will drop off at the clinic. New problem.   PMH and SH reviewed  Meds, vitals, and allergies reviewed.   ROS: See HPI.  Otherwise negative.    GEN: nad, alert and oriented HEENT: mucous membranes moist, tm wnl B NECK: supple w/o LA CV: IRR, not tachy PULM: ctab, no inc wob ABD: soft, +bs EXT: no edema SKIN: no acute rash  Diabetic foot exam: Normal inspection No skin breakdown No calluses  Normal DP pulses Normal sensation to light touch and monofilament Nails normal

## 2011-07-31 ENCOUNTER — Encounter: Payer: Self-pay | Admitting: Family Medicine

## 2011-07-31 DIAGNOSIS — Z7189 Other specified counseling: Secondary | ICD-10-CM | POA: Insufficient documentation

## 2011-07-31 NOTE — Assessment & Plan Note (Signed)
Controlled, no change in meds. Continue diet.  Labs d/w pt.

## 2011-07-31 NOTE — Assessment & Plan Note (Signed)
Cr at baseline, no change in meds.  Labs d/w pt.

## 2011-07-31 NOTE — Assessment & Plan Note (Signed)
Controlled , no change in meds 

## 2011-07-31 NOTE — Assessment & Plan Note (Signed)
Rate controlled, no CP, no BLE edema.  Feeling well.

## 2011-07-31 NOTE — Assessment & Plan Note (Signed)
Advance directive.  D/w pt.  He has one done, will drop off at the clinic.

## 2011-07-31 NOTE — Assessment & Plan Note (Signed)
D/w pt about quitting.  He'll consider.

## 2011-07-31 NOTE — Assessment & Plan Note (Signed)
Dec meds given LDL being so low.  Labs d/w pt.  Continue plain simva 20mg 

## 2011-08-03 ENCOUNTER — Telehealth: Payer: Self-pay | Admitting: Radiology

## 2011-08-03 ENCOUNTER — Ambulatory Visit (INDEPENDENT_AMBULATORY_CARE_PROVIDER_SITE_OTHER): Payer: Medicare Other | Admitting: Family Medicine

## 2011-08-03 DIAGNOSIS — I4891 Unspecified atrial fibrillation: Secondary | ICD-10-CM

## 2011-08-03 DIAGNOSIS — E119 Type 2 diabetes mellitus without complications: Secondary | ICD-10-CM

## 2011-08-03 DIAGNOSIS — Z7901 Long term (current) use of anticoagulants: Secondary | ICD-10-CM

## 2011-08-03 DIAGNOSIS — Z5181 Encounter for therapeutic drug level monitoring: Secondary | ICD-10-CM

## 2011-08-03 NOTE — Patient Instructions (Signed)
6 MG DAILY, 7.5 MG tues ( restarted this dose 1 week ago)recheck in 2 weeks

## 2011-08-03 NOTE — Telephone Encounter (Signed)
Patient wants to know when his next scheduled appt is with you.

## 2011-08-04 ENCOUNTER — Encounter: Payer: Self-pay | Admitting: Family Medicine

## 2011-08-04 ENCOUNTER — Ambulatory Visit (INDEPENDENT_AMBULATORY_CARE_PROVIDER_SITE_OTHER): Payer: Medicare Other | Admitting: Family Medicine

## 2011-08-04 DIAGNOSIS — J3489 Other specified disorders of nose and nasal sinuses: Secondary | ICD-10-CM

## 2011-08-04 DIAGNOSIS — R0981 Nasal congestion: Secondary | ICD-10-CM

## 2011-08-04 NOTE — Telephone Encounter (Signed)
Patient notified as instructed at office visit. Patient given printed instructions to schedule follow-up appointments per Dr. Para March. Patient will schedule appointments before leaving the office today.

## 2011-08-04 NOTE — Patient Instructions (Signed)
I think this is from nasal congestion that you notice more when you are laying flat.  You could try taking plain claritin (not claritin D).  I would try to taper off the afrin, use nasal saline instead, and try to get propped up when you are sleeping.  This should gradually get better.  Take care.

## 2011-08-04 NOTE — Progress Notes (Signed)
Last two nights with trouble laying supine- he'll get transiently stuffy and then sob with position change.  He is fine upright, sitting up.  He has nasal stuffiness.  He was prev better but this has started recently.  No FCNAV.  No bleeding but occ bruising noted.  He isn't sob here.  No CP.  In talking with him, it sounds like he has upper air congestion that is the source of the problem.   Meds, vitals, and allergies reviewed.   ROS: See HPI.  Otherwise, noncontributory.  nad ncat Mmm Tm w/o erythema Nasal exam stuffy with some clear discharge Op with mild cobblestoning rrr with ectopy but not tachy ctab

## 2011-08-04 NOTE — Telephone Encounter (Signed)
OV in 6 months, OV, A1c ahead of time.  Lipids to be done in ~2 months at INR check, no OV for that.  Thanks.

## 2011-08-05 ENCOUNTER — Encounter: Payer: Self-pay | Admitting: Family Medicine

## 2011-08-05 DIAGNOSIS — R0981 Nasal congestion: Secondary | ICD-10-CM | POA: Insufficient documentation

## 2011-08-05 NOTE — Assessment & Plan Note (Signed)
I think this is the source of the sx when supine, related to postnasal gtt.  I don't suspect heart failure or other pulmonary pathology.  We looks well, in nad and lungs are ctab.  I would consider using claritin and f/u prn.

## 2011-08-17 ENCOUNTER — Ambulatory Visit (INDEPENDENT_AMBULATORY_CARE_PROVIDER_SITE_OTHER): Payer: Medicare Other | Admitting: Family Medicine

## 2011-08-17 ENCOUNTER — Encounter: Payer: Self-pay | Admitting: Family Medicine

## 2011-08-17 DIAGNOSIS — Z7901 Long term (current) use of anticoagulants: Secondary | ICD-10-CM

## 2011-08-17 DIAGNOSIS — R3129 Other microscopic hematuria: Secondary | ICD-10-CM | POA: Insufficient documentation

## 2011-08-17 DIAGNOSIS — Z5181 Encounter for therapeutic drug level monitoring: Secondary | ICD-10-CM

## 2011-08-17 DIAGNOSIS — I4891 Unspecified atrial fibrillation: Secondary | ICD-10-CM

## 2011-08-17 DIAGNOSIS — E291 Testicular hypofunction: Secondary | ICD-10-CM | POA: Insufficient documentation

## 2011-08-17 LAB — POCT INR: INR: 3.3

## 2011-08-17 NOTE — Patient Instructions (Signed)
Continue current dose, check in 4 weeks  

## 2011-08-26 ENCOUNTER — Encounter: Payer: Self-pay | Admitting: Family Medicine

## 2011-08-26 DIAGNOSIS — K5730 Diverticulosis of large intestine without perforation or abscess without bleeding: Secondary | ICD-10-CM | POA: Insufficient documentation

## 2011-09-02 ENCOUNTER — Encounter: Payer: Self-pay | Admitting: Family Medicine

## 2011-09-16 ENCOUNTER — Ambulatory Visit (INDEPENDENT_AMBULATORY_CARE_PROVIDER_SITE_OTHER): Payer: Medicare Other | Admitting: Family Medicine

## 2011-09-16 DIAGNOSIS — Z5181 Encounter for therapeutic drug level monitoring: Secondary | ICD-10-CM

## 2011-09-16 DIAGNOSIS — Z7901 Long term (current) use of anticoagulants: Secondary | ICD-10-CM

## 2011-09-16 LAB — POCT INR: INR: 3.2

## 2011-09-16 NOTE — Patient Instructions (Signed)
Continue 6 MG DAILY, 7.5 MG tues , recheck 4 weeks

## 2011-09-28 ENCOUNTER — Telehealth: Payer: Self-pay | Admitting: *Deleted

## 2011-09-28 MED ORDER — METOPROLOL TARTRATE 25 MG PO TABS: 25.0000 mg | ORAL_TABLET | Freq: Two times a day (BID) | ORAL | Status: DC

## 2011-09-28 NOTE — Telephone Encounter (Signed)
Toprol XL is not covered by patient's 2013 insurance plan.  Pt would like a cheaper alternative.  [facility], [address], [place], [place]

## 2011-09-28 NOTE — Telephone Encounter (Signed)
Change to metoprolol 25mg  po bid.  rx sent.  Thanks.

## 2011-10-01 ENCOUNTER — Telehealth: Payer: Self-pay | Admitting: Internal Medicine

## 2011-10-05 ENCOUNTER — Other Ambulatory Visit (INDEPENDENT_AMBULATORY_CARE_PROVIDER_SITE_OTHER): Payer: Medicare Other

## 2011-10-05 DIAGNOSIS — E119 Type 2 diabetes mellitus without complications: Secondary | ICD-10-CM

## 2011-10-05 LAB — LIPID PANEL
Cholesterol: 105 mg/dL (ref 0–200)
HDL: 56.2 mg/dL (ref >39.00)
LDL Cholesterol: 37 mg/dL (ref 0–99)
Total CHOL/HDL Ratio: 2
Triglycerides: 61 mg/dL (ref 0.0–149.0)
VLDL: 12.2 mg/dL (ref 0.0–40.0)

## 2011-10-06 ENCOUNTER — Telehealth: Payer: Self-pay | Admitting: Internal Medicine

## 2011-10-06 ENCOUNTER — Ambulatory Visit (INDEPENDENT_AMBULATORY_CARE_PROVIDER_SITE_OTHER): Payer: Medicare Other | Admitting: Family Medicine

## 2011-10-06 ENCOUNTER — Encounter: Payer: Self-pay | Admitting: Family Medicine

## 2011-10-06 DIAGNOSIS — R0989 Other specified symptoms and signs involving the circulatory and respiratory systems: Secondary | ICD-10-CM

## 2011-10-06 DIAGNOSIS — I4891 Unspecified atrial fibrillation: Secondary | ICD-10-CM

## 2011-10-06 DIAGNOSIS — R0600 Dyspnea, unspecified: Secondary | ICD-10-CM

## 2011-10-06 DIAGNOSIS — R062 Wheezing: Secondary | ICD-10-CM

## 2011-10-06 DIAGNOSIS — R0609 Other forms of dyspnea: Secondary | ICD-10-CM

## 2011-10-06 MED ORDER — ALBUTEROL SULFATE HFA 108 (90 BASE) MCG/ACT IN AERS: 2.0000 | INHALATION_SPRAY | Freq: Four times a day (QID) | RESPIRATORY_TRACT | Status: DC | PRN

## 2011-10-06 NOTE — Telephone Encounter (Signed)
See about getting him in today at 3:30.  Thanks.

## 2011-10-06 NOTE — Patient Instructions (Signed)
Use the inhaler if needed (I sent the med to the pharamcy) and you can get your results through our phone system.  Follow the instructions on the blue card. See Shirlee Limerick about your referral before you leave today.  Take care. We'll be in touch about the possible fluid pill.

## 2011-10-06 NOTE — Telephone Encounter (Signed)
Patient's wife called and stated that patient is unhappy and has a unhealthy look about him.  He is wheezing all the time and his stomach is bloated.  She did say this has been going on for a while but it seems to be worse.  I made him an appointment for Friday but if you want to see him sooner let me know.

## 2011-10-06 NOTE — Progress Notes (Signed)
Cough is gone from prev OV.  However, he is still having trouble sleeping supine, he'll wake up due to nasal congestion and postnasal gtt.   Breathing is noisier with more wheeze over last few days. No fevers.    Also, abd is more bloated over last few days and inc in weight (~10 lbs on home scales) noted by pt in last week.  No vomiting.  More belching in last few days. Ankles are more puffy than prev.  No CP, no heart racing.  Harder for pt to get up the stairs, ie DOE but not at rest.  Smoker, prev was on advair for brief time with relief.  Not on inhalers now.    PMH and SH reviewed  ROS: See HPI, otherwise noncontributory.  Meds, vitals, and allergies reviewed.   nad ncat Mmm Neck supple IRR but not tachy Not wheezing, ctab, no dec in BS at the bases.  abd protuberant but no pitting edema on abd, soft, not ttp 1+ edema on BLE   EKG reviewed, AF, rate controlled.   Labs pending.

## 2011-10-06 NOTE — Telephone Encounter (Signed)
Scheduled appt.

## 2011-10-07 ENCOUNTER — Encounter: Payer: Self-pay | Admitting: Family Medicine

## 2011-10-07 ENCOUNTER — Telehealth: Payer: Self-pay | Admitting: Family Medicine

## 2011-10-07 DIAGNOSIS — R609 Edema, unspecified: Secondary | ICD-10-CM

## 2011-10-07 DIAGNOSIS — R062 Wheezing: Secondary | ICD-10-CM | POA: Insufficient documentation

## 2011-10-07 DIAGNOSIS — I4891 Unspecified atrial fibrillation: Secondary | ICD-10-CM

## 2011-10-07 LAB — BASIC METABOLIC PANEL
BUN: 22 mg/dL (ref 6–23)
CO2: 26 mEq/L (ref 19–32)
Calcium: 9.1 mg/dL (ref 8.4–10.5)
Chloride: 106 mEq/L (ref 96–112)
Creatinine, Ser: 1.3 mg/dL (ref 0.4–1.5)
GFR: 57.95 mL/min — ABNORMAL LOW (ref >60.00)
Glucose, Bld: 91 mg/dL (ref 70–99)
Potassium: 4.9 mEq/L (ref 3.5–5.1)
Sodium: 142 mEq/L (ref 135–145)

## 2011-10-07 LAB — BRAIN NATRIURETIC PEPTIDE: Pro B Natriuretic peptide (BNP): 1663 pg/mL — ABNORMAL HIGH (ref 0.0–100.0)

## 2011-10-07 MED ORDER — FUROSEMIDE 20 MG PO TABS: 20.0000 mg | ORAL_TABLET | Freq: Every day | ORAL | Status: DC

## 2011-10-07 NOTE — Assessment & Plan Note (Signed)
Clear today but with some UAN.  This could be related to AF, COPD, or the postnasal gtt.  I would use SABA prn with tachycardia caution for now.  Will follow.  He isn't wheezing today.  I would like to try to clear up the issues with edema before addressing this.  D/w pt.

## 2011-10-07 NOTE — Telephone Encounter (Signed)
Please call pt.  His renal function was okay on the labs.  He may have fluid retention related to AF.  I would start taking lasix 20mg  a day and see if that helps the edema.  I would get echo scheduled given the fluid accumulation and then have him f/u with cards.  I put in the orders for the lasix and the echo.

## 2011-10-07 NOTE — Telephone Encounter (Signed)
Called pts wife and gave her Appt info for Dr Mariah Milling on 10/08/2011  At 11:15, and also the 2D Echo appt at [facility] on 10/13/2011 at 12:45pm. Told them to expect a call from [place] regarding a medication.

## 2011-10-07 NOTE — Telephone Encounter (Signed)
Patient and his wife notified as instructed by telephone. 

## 2011-10-07 NOTE — Assessment & Plan Note (Signed)
With weight gain and edema.  Will check BMET and BNP, refer back to cards given the AF.  Consider lasix depending on labs. He isn't toxic and I didn't want to start that before seeing Cr/BNP.  D/w pt and wife.  >25 min spent with face to face with patient, >50% counseling and/or coordinating care.

## 2011-10-08 ENCOUNTER — Ambulatory Visit (INDEPENDENT_AMBULATORY_CARE_PROVIDER_SITE_OTHER): Payer: Medicare Other | Admitting: Cardiovascular Disease

## 2011-10-08 ENCOUNTER — Encounter: Payer: Self-pay | Admitting: Cardiovascular Disease

## 2011-10-08 DIAGNOSIS — I4891 Unspecified atrial fibrillation: Secondary | ICD-10-CM

## 2011-10-08 DIAGNOSIS — E785 Hyperlipidemia, unspecified: Secondary | ICD-10-CM

## 2011-10-08 DIAGNOSIS — R0602 Shortness of breath: Secondary | ICD-10-CM

## 2011-10-08 DIAGNOSIS — I509 Heart failure, unspecified: Secondary | ICD-10-CM

## 2011-10-08 DIAGNOSIS — I10 Essential (primary) hypertension: Secondary | ICD-10-CM

## 2011-10-08 DIAGNOSIS — I5033 Acute on chronic diastolic (congestive) heart failure: Secondary | ICD-10-CM

## 2011-10-08 DIAGNOSIS — I479 Paroxysmal tachycardia, unspecified: Secondary | ICD-10-CM

## 2011-10-08 NOTE — Assessment & Plan Note (Addendum)
Echo pending this Tuesday. My suspicion is that he has diastolic dysfunction, largely in part to his atrial fibrillation. The echo will help confirm his ejection fraction. If this is normal, my suspicion is that he had excess fluid and salt intake through the holiday season.   Unable to exclude underlying ischemia. We could consider a Myoview Given the coronary calcifications seen on CT scan. We can discuss this with him after his echo is completed.  The echocardiogram will also help estimate right ventricular systolic pressures and guide diuresis.

## 2011-10-08 NOTE — Assessment & Plan Note (Signed)
Currently on simvastatin. Would recommend aggressive cholesterol management given coronary calcifications seen on CT scan.

## 2011-10-08 NOTE — Assessment & Plan Note (Signed)
Blood pressure is well controlled on today's visit. No changes made to the medications. 

## 2011-10-08 NOTE — Assessment & Plan Note (Signed)
Chronic atrial fibrillation, on anticoagulation, adequate rate control

## 2011-10-08 NOTE — Progress Notes (Signed)
Patient ID: Benjamin Soto, male    DOB: 24-Nov-1935, 76 y.o.   MRN: [id number]  HPI Comments: Benjamin Soto is a pleasant 76 year old gentleman with a history of chronic atrial fibrillation, remote history of SVT, with severe arthritis in his knees, who has been on warfarin for at least 5 years, Also with a history of diabetes, hyperlipidemia, Who presents For routine followup and for recent problems with shortness of breath and weight gain.  He reports that he had an upper respiratory infection for several weeks in December. He did have a 9 pound weight gain and felt very short of breath through the holidays. He did drink significant wine, had all the meals and did eat out. He was started on a Lasix pill and took 20 mg yesterday with 5 weight loss overnight. He did take a Lasix pill this morning as well. He feels that his edema is slowly improving and his shortness of breath is already somewhat better. Peak weight was 206 pounds. He reports his baseline weight is 190 pounds. Currently he reports being 199 pounds. He denies any chest pain, lightheadedness or dizziness.   CT Scan of his abdomen several months ago showed coronary calcifications. He has had no recent cardiac catheter or stress test.  He does report a remote history of low testosterone and was previously on testosterone supplemental cream. He is uncertain when this was discontinued.  Labs show total cholesterol less than 100, LDL in the 40 range  EKG today shows atrial fibrillation with ventricular rate 77 beats per minute, Right bundle-branch block, diffuse ST abnormality in leads V1 to V6   Outpatient Encounter Prescriptions as of 10/08/2011  Medication Sig Dispense Refill  . albuterol (PROVENTIL HFA;VENTOLIN HFA) 108 (90 BASE) MCG/ACT inhaler Inhale 2 puffs into the lungs every 6 (six) hours as needed for wheezing.  1 Inhaler  1  . allopurinol (ZYLOPRIM) 100 MG tablet Take 1 tablet (100 mg total) by mouth daily.  90 tablet  3  .  allopurinol (ZYLOPRIM) 300 MG tablet Take 1 tablet once daily, additionally taking 100mg  for total of 400mg  a day  90 tablet  3  . furosemide (LASIX) 20 MG tablet Take 1 tablet (20 mg total) by mouth daily.  30 tablet  1  . glucose blood (ONE TOUCH TEST STRIPS) test strip Use once daily       . metoprolol tartrate (LOPRESSOR) 25 MG tablet Take 1 tablet (25 mg total) by mouth 2 (two) times daily.  180 tablet  3  . NON FORMULARY Energy Greens Daily       . pioglitazone-metformin (ACTOPLUS MET) 15-500 MG per tablet Take 1 tablet by mouth 2 (two) times daily with a meal.  180 tablet  3  . simvastatin (ZOCOR) 20 MG tablet Take 1 tablet (20 mg total) by mouth at bedtime.  90 tablet  3  . verapamil (CALAN-SR) 120 MG CR tablet Take 1 tablet (120 mg total) by mouth daily. Every morning   90 tablet  3  . warfarin (COUMADIN) 5 MG tablet Take 1 tablet (5 mg total) by mouth daily. And on Tuesday pt takes 7.5 mg.  100 tablet  3    Review of Systems  Constitutional: Negative.   HENT: Negative.   Eyes: Negative.   Respiratory: Positive for shortness of breath.   Cardiovascular: Positive for leg swelling.  Gastrointestinal: Negative.   Skin: Negative.   Neurological: Negative.   Hematological: Negative.   Psychiatric/Behavioral: Negative.   All other  systems reviewed and are negative.   BP 108/64  Pulse 77  Ht 5\' 9"  (1.753 m)  Wt 205 lb (92.987 kg)  BMI 30.27 kg/m2   Physical Exam  Nursing note and vitals reviewed. Constitutional: He is oriented to person, place, and time. He appears well-developed and well-nourished.  HENT:  Head: Normocephalic.  Nose: Nose normal.  Mouth/Throat: Oropharynx is clear and moist.  Eyes: Conjunctivae are normal. Pupils are equal, round, and reactive to light.  Neck: Normal range of motion. Neck supple. No JVD present.  Cardiovascular: Normal rate, S1 normal, S2 normal, normal heart sounds and intact distal pulses.  An irregularly irregular rhythm present. Exam  reveals no gallop and no friction rub.   No murmur heard.      1+ pitting edema in the lower extremities bilaterally  Pulmonary/Chest: Effort normal and breath sounds normal. No respiratory distress. He has no wheezes. He has no rales. He exhibits no tenderness.  Abdominal: Soft. Bowel sounds are normal. He exhibits no distension. There is no tenderness.  Musculoskeletal: Normal range of motion. He exhibits edema. He exhibits no tenderness.  Lymphadenopathy:    He has no cervical adenopathy.  Neurological: He is alert and oriented to person, place, and time. Coordination normal.  Skin: Skin is warm and dry. No rash noted. No erythema.  Psychiatric: He has a normal mood and affect. His behavior is normal. Judgment and thought content normal.           Assessment and Plan

## 2011-10-08 NOTE — Patient Instructions (Signed)
ECHO has been changed to [place] at 3 pm No medication changes were made.  Please call us if you have new issues that need to be addressed before your next appt.  Your physician wants you to follow-up in: 2 weeks  You will receive a reminder letter in the mail two months in advance. If you don't receive a letter, please call our office to schedule the follow-up appointment.

## 2011-10-09 ENCOUNTER — Ambulatory Visit: Payer: Medicare Other | Admitting: Family Medicine

## 2011-10-13 ENCOUNTER — Other Ambulatory Visit (HOSPITAL_COMMUNITY): Payer: Medicare Other | Admitting: Radiology

## 2011-10-13 ENCOUNTER — Other Ambulatory Visit (INDEPENDENT_AMBULATORY_CARE_PROVIDER_SITE_OTHER): Payer: Medicare Other | Admitting: *Deleted

## 2011-10-13 DIAGNOSIS — I4891 Unspecified atrial fibrillation: Secondary | ICD-10-CM

## 2011-10-13 DIAGNOSIS — I519 Heart disease, unspecified: Secondary | ICD-10-CM

## 2011-10-13 DIAGNOSIS — R609 Edema, unspecified: Secondary | ICD-10-CM

## 2011-10-13 DIAGNOSIS — R0602 Shortness of breath: Secondary | ICD-10-CM

## 2011-10-14 ENCOUNTER — Telehealth: Payer: Self-pay | Admitting: *Deleted

## 2011-10-14 NOTE — Telephone Encounter (Signed)
Notified pt's wife, per Dr. Mariah Milling that echo showed EF of 20-25% and he wants to discuss options that are needed. Pt has appt in 1 week, 1/31, in the meantime advised he aim for goal wt of 190lbs, continue Lasix as he is already taking. Advised he call the office if wt incr before appt.

## 2011-10-15 ENCOUNTER — Ambulatory Visit (INDEPENDENT_AMBULATORY_CARE_PROVIDER_SITE_OTHER): Payer: Medicare Other | Admitting: Family Medicine

## 2011-10-15 DIAGNOSIS — Z5181 Encounter for therapeutic drug level monitoring: Secondary | ICD-10-CM

## 2011-10-15 DIAGNOSIS — I4891 Unspecified atrial fibrillation: Secondary | ICD-10-CM

## 2011-10-15 DIAGNOSIS — Z7901 Long term (current) use of anticoagulants: Secondary | ICD-10-CM

## 2011-10-15 LAB — POCT INR: INR: 2.8

## 2011-10-15 NOTE — Patient Instructions (Signed)
Continue current dose, check in 4 weeks  

## 2011-10-22 ENCOUNTER — Ambulatory Visit (INDEPENDENT_AMBULATORY_CARE_PROVIDER_SITE_OTHER): Payer: Medicare Other | Admitting: Cardiovascular Disease

## 2011-10-22 ENCOUNTER — Encounter: Payer: Self-pay | Admitting: Cardiovascular Disease

## 2011-10-22 DIAGNOSIS — I5022 Chronic systolic (congestive) heart failure: Secondary | ICD-10-CM | POA: Insufficient documentation

## 2011-10-22 DIAGNOSIS — I10 Essential (primary) hypertension: Secondary | ICD-10-CM

## 2011-10-22 DIAGNOSIS — I509 Heart failure, unspecified: Secondary | ICD-10-CM

## 2011-10-22 DIAGNOSIS — E119 Type 2 diabetes mellitus without complications: Secondary | ICD-10-CM

## 2011-10-22 DIAGNOSIS — E785 Hyperlipidemia, unspecified: Secondary | ICD-10-CM

## 2011-10-22 DIAGNOSIS — I4891 Unspecified atrial fibrillation: Secondary | ICD-10-CM

## 2011-10-22 MED ORDER — FUROSEMIDE 40 MG PO TABS: 40.0000 mg | ORAL_TABLET | Freq: Every day | ORAL | Status: DC

## 2011-10-22 MED ORDER — LISINOPRIL 10 MG PO TABS: 10.0000 mg | ORAL_TABLET | Freq: Every day | ORAL | Status: DC

## 2011-10-22 MED ORDER — CARVEDILOL 12.5 MG PO TABS: 12.5000 mg | ORAL_TABLET | Freq: Two times a day (BID) | ORAL | Status: DC

## 2011-10-22 NOTE — Assessment & Plan Note (Signed)
We have encouraged continued exercise, careful diet management in an effort to lose weight. 

## 2011-10-22 NOTE — Assessment & Plan Note (Signed)
Rate is reasonably well controlled. Uncertain if the atrial fibrillation is causing his depressed ejection fraction. Continue warfarin. We will continue rate control with carvedilol. We might need digoxin 0.125 mg daily for her elevated heart rate.

## 2011-10-22 NOTE — Patient Instructions (Addendum)
Please stop the metopolol  Stop verapamil Start lisinopril 10 mg daily Start coreg twice a day Increase the lasix to 40 mg in the Am  Please call us if you have new issues that need to be addressed before your next appt.  Your physician wants you to follow-up in: 1 months.  You will receive a reminder letter in the mail two months in advance. If you don't receive a letter, please call our office to schedule the follow-up appointment.  Please call us if you would like to schedule a cardiac catheterization

## 2011-10-22 NOTE — Assessment & Plan Note (Signed)
Symptoms of shortness of breath and edema consistent with systolic CHF, starting in December 2012.  He does have significant wine intake and we have asked him to stop. We will change his metoprolol to carvedilol 12.5 mg b.i.d.. We will hold the verapamil and start lisinopril 10 mg daily. We'll need to watch his potassium level closely as he did have elevated potassium in the past. I hope with the Lasix, his potassium will remain steady.  We did talk to him about other causes of heart failure including ischemia. We suggested he have either a stress test or cardiac catheterization. Given his smoking history, Depressed ejection fraction, calcifications seen on CT scan, atrial fibrillation , diabetes, And inability to walk on a treadmill, we have suggested he consider a cardiac catheterization. He will think about this and call the office when he makes a decision.

## 2011-10-22 NOTE — Assessment & Plan Note (Signed)
Blood pressure has been running low. We will make the changes as above.

## 2011-10-22 NOTE — Assessment & Plan Note (Signed)
Continue simvastatin. Goal LDL less than 70. 

## 2011-10-22 NOTE — Progress Notes (Signed)
Patient ID: Benjamin Soto, male    DOB: 1936/07/07, 76 y.o.   MRN: [id number]  HPI Comments: Benjamin Soto is a pleasant 76 year old gentleman with a history of chronic atrial fibrillation, long smoking hx, heavy ETOH hx, remote history of SVT, with severe arthritis in his knees, who has been on warfarin for at least 5 years, Also with a history of diabetes, hyperlipidemia, Who presents For routine followup, recent problems with shortness of breath and weight gain. He has had no recent cardiac catheter or stress test.  On his last clinic visit, he reported having  an upper respiratory infection for several weeks in December, 9 pound weight gain and felt very short of breath through the holidays. He did drink significant wine, had all the meals and did eat out. He was started on a Lasix pill and took 20 mg yesterday with 13 pound weight loss. Previous weight was 206 pounds, now 193.  He has not lost much weight in the past week or so.  His wife reports that he has been drinking significant amounts of wine. He is not able to drink one glass at a time. He denies any chest pain. He is no longer using the inhaler as he feels his breathing has improved and he does not have wheezing.   Recent echocardiogram shows severely depressed ejection fraction estimated at 25-30%, moderate MR, moderate pulmonary hypertension. CT Scan of his abdomen several months ago showed coronary calcifications.   He does report a remote history of low testosterone and was previously on testosterone supplemental cream. He is uncertain when this was discontinued. Labs show total cholesterol less than 100, LDL in the 40 range  Old EKG today showed atrial fibrillation with ventricular rate 77 beats per minute, Right bundle-branch block, diffuse ST abnormality in leads V1 to V6   Outpatient Encounter Prescriptions as of 10/22/2011  Medication Sig Dispense Refill  . albuterol (PROVENTIL HFA;VENTOLIN HFA) 108 (90 BASE) MCG/ACT inhaler  Inhale 2 puffs into the lungs every 6 (six) hours as needed for wheezing.  1 Inhaler  1  . allopurinol (ZYLOPRIM) 100 MG tablet Take 1 tablet (100 mg total) by mouth daily.  90 tablet  3  . allopurinol (ZYLOPRIM) 300 MG tablet Take 1 tablet once daily, additionally taking 100mg  for total of 400mg  a day  90 tablet  3  . glucose blood (ONE TOUCH TEST STRIPS) test strip Use once daily       . NON FORMULARY Energy Greens Daily       . pioglitazone-metformin (ACTOPLUS MET) 15-500 MG per tablet Take 1 tablet by mouth 2 (two) times daily with a meal.  180 tablet  3  . simvastatin (ZOCOR) 20 MG tablet Take 1 tablet (20 mg total) by mouth at bedtime.  90 tablet  3  . warfarin (COUMADIN) 5 MG tablet Take 1 tablet (5 mg total) by mouth daily. And on Tuesday pt takes 7.5 mg.  100 tablet  3  .  furosemide (LASIX) 20 MG tablet Take 1 tablet (20 mg total) by mouth daily.  30 tablet  1  .  metoprolol tartrate (LOPRESSOR) 25 MG tablet Take 1 tablet (25 mg total) by mouth 2 (two) times daily.  180 tablet  3  .  verapamil (CALAN-SR) 120 MG CR tablet Take 1 tablet (120 mg total) by mouth daily. Every morning   90 tablet  3    Review of Systems  Constitutional: Negative.   HENT: Negative.   Eyes: Negative.  Cardiovascular: Positive for leg swelling.  Gastrointestinal: Negative.   Skin: Negative.   Neurological: Negative.   Hematological: Negative.   Psychiatric/Behavioral: Negative.   All other systems reviewed and are negative.   BP 99/65  Pulse 86  Ht 5\' 9"  (1.753 m)  Wt 196 lb (88.905 kg)  BMI 28.94 kg/m2   Physical Exam  Nursing note and vitals reviewed. Constitutional: He is oriented to person, place, and time. He appears well-developed and well-nourished.  HENT:  Head: Normocephalic.  Nose: Nose normal.  Mouth/Throat: Oropharynx is clear and moist.  Eyes: Conjunctivae are normal. Pupils are equal, round, and reactive to light.  Neck: Normal range of motion. Neck supple. No JVD present.    Cardiovascular: Normal rate, S1 normal, S2 normal, normal heart sounds and intact distal pulses.  An irregularly irregular rhythm present. Exam reveals no gallop and no friction rub.   No murmur heard.      1+ pitting edema in the lower extremities bilaterally  Pulmonary/Chest: Effort normal and breath sounds normal. No respiratory distress. He has no wheezes. He has no rales. He exhibits no tenderness.  Abdominal: Soft. Bowel sounds are normal. He exhibits no distension. There is no tenderness.  Musculoskeletal: Normal range of motion. He exhibits edema. He exhibits no tenderness.  Lymphadenopathy:    He has no cervical adenopathy.  Neurological: He is alert and oriented to person, place, and time. Coordination normal.  Skin: Skin is warm and dry. No rash noted. No erythema.  Psychiatric: He has a normal mood and affect. His behavior is normal. Judgment and thought content normal.           Assessment and Plan

## 2011-10-29 ENCOUNTER — Telehealth: Payer: Self-pay | Admitting: *Deleted

## 2011-10-29 NOTE — Telephone Encounter (Signed)
Pt called stating his coreg rx that he got 10/22/11 12.5 mg bid was only dispensed for # 30. I called [facility] and s/w pharmacist who states that she will fix the error this time but states since the quantity was sent in as 30 on 10/22/11 that pt would have to finish the bottle for the 15 day supply and then we would  Have to call in a new rx for the correct quantity of # 60, said the insurance was run through as for a 15 days supply. I told the pharmacist that we appreciated her help this time. Danielle Rankin

## 2011-11-13 ENCOUNTER — Ambulatory Visit: Payer: Medicare Other

## 2011-11-16 ENCOUNTER — Ambulatory Visit (INDEPENDENT_AMBULATORY_CARE_PROVIDER_SITE_OTHER): Payer: Medicare Other | Admitting: Family Medicine

## 2011-11-16 DIAGNOSIS — Z7901 Long term (current) use of anticoagulants: Secondary | ICD-10-CM

## 2011-11-16 DIAGNOSIS — Z5181 Encounter for therapeutic drug level monitoring: Secondary | ICD-10-CM

## 2011-11-16 DIAGNOSIS — I4891 Unspecified atrial fibrillation: Secondary | ICD-10-CM

## 2011-11-16 LAB — POCT INR: INR: 2.9

## 2011-11-16 NOTE — Patient Instructions (Signed)
Continue current dose, check in 4 weeks  

## 2011-11-19 ENCOUNTER — Ambulatory Visit (INDEPENDENT_AMBULATORY_CARE_PROVIDER_SITE_OTHER): Payer: Medicare Other | Admitting: Cardiovascular Disease

## 2011-11-19 ENCOUNTER — Encounter: Payer: Self-pay | Admitting: Cardiovascular Disease

## 2011-11-19 VITALS — BP 92/58 | HR 115 | Ht 70.0 in | Wt 200.0 lb

## 2011-11-19 DIAGNOSIS — I5020 Unspecified systolic (congestive) heart failure: Secondary | ICD-10-CM

## 2011-11-19 DIAGNOSIS — I4891 Unspecified atrial fibrillation: Secondary | ICD-10-CM

## 2011-11-19 DIAGNOSIS — E785 Hyperlipidemia, unspecified: Secondary | ICD-10-CM

## 2011-11-19 DIAGNOSIS — I5022 Chronic systolic (congestive) heart failure: Secondary | ICD-10-CM

## 2011-11-19 DIAGNOSIS — I509 Heart failure, unspecified: Secondary | ICD-10-CM

## 2011-11-19 DIAGNOSIS — I10 Essential (primary) hypertension: Secondary | ICD-10-CM

## 2011-11-19 NOTE — Assessment & Plan Note (Signed)
Cholesterol is at goal on the current lipid regimen. No changes to the medications were made.  

## 2011-11-19 NOTE — Assessment & Plan Note (Signed)
Rate is borderline elevated. We will monitor this for now. We will check basic metabolic panel and BNP after recent diuresis.

## 2011-11-19 NOTE — Patient Instructions (Addendum)
You are doing well. Please cut the lisinopril in 1/2 daily (cut the 10 mg in half) Please monitor your blood pressure at home  Please call us if you have new issues that need to be addressed before your next appt.  Your physician wants you to follow-up in: 6 months.  You will receive a reminder letter in the mail two months in advance. If you don't receive a letter, please call our office to schedule the follow-up appointment.

## 2011-11-19 NOTE — Assessment & Plan Note (Signed)
Blood pressure  low. We'll decrease lisinopril to 5 mg daily.  He has had previous episodes of near syncope likely secondary to hypotension.

## 2011-11-19 NOTE — Progress Notes (Signed)
Patient ID: Benjamin Soto, male    DOB: [birth year], 76 y.o.   MRN: [id number]  HPI Comments: Benjamin Soto is a pleasant 76 year old gentleman with a history of chronic atrial fibrillation, long smoking hx, heavy ETOH hx, remote history of SVT, with severe arthritis in his knees, who has been on warfarin for at least 5 years, Also with a history of diabetes, hyperlipidemia, Who presents For routine followup,  On his last clinic visit, he had recent problems with shortness of breath and weight gain. He has had no recent cardiac catheter or stress test.  he reported having  an upper respiratory infection for several weeks in December, 9 pound weight gain and felt very short of breath through the holidays. He did drink significant wine, had all the meals and did eat out. He was started on a Lasix pill and took 20 mg with 13 pound weight loss. Previous weight was 206 pounds, now 193.  He has not lost much weight in the past week or so.  His wife reports that he has been drinking significant amounts of wine.  On his last clinic visit we changed his metoprolol to Coreg, healthy calcium channel blocker and started low-dose lisinopril. Also continued Lasix daily. He feels better, his edema has resolved, he is able to lie flat without shortness of breath and feels he is back at baseline.    Previous echocardiogram shows severely depressed ejection fraction estimated at 25-30%, moderate MR, moderate pulmonary hypertension. CT Scan of his abdomen several months ago showed coronary calcifications.   He does report a remote history of low testosterone and was previously on testosterone supplemental cream. He is uncertain when this was discontinued.  Labs show total cholesterol less than 100, LDL in the 40 range EKG shows atrial fibrillation with rate 105, right bundle branch block, left anterior fascicular block   Outpatient Encounter Prescriptions as of 11/19/2011  Medication Sig Dispense Refill  . albuterol  (PROVENTIL HFA;VENTOLIN HFA) 108 (90 BASE) MCG/ACT inhaler Inhale 2 puffs into the lungs every 6 (six) hours as needed for wheezing.  1 Inhaler  1  . allopurinol (ZYLOPRIM) 100 MG tablet Take 1 tablet (100 mg total) by mouth daily.  90 tablet  3  . allopurinol (ZYLOPRIM) 300 MG tablet Take 1 tablet once daily, additionally taking 100mg  for total of 400mg  a day  90 tablet  3  . carvedilol (COREG) 12.5 MG tablet Take 1 tablet (12.5 mg total) by mouth 2 (two) times daily.  30 tablet  11  . furosemide (LASIX) 40 MG tablet Take 1 tablet (40 mg total) by mouth daily.  30 tablet  6  . glucose blood (ONE TOUCH TEST STRIPS) test strip Use once daily       . lisinopril (PRINIVIL,ZESTRIL) 10 MG tablet Take 1 tablet (10 mg total) by mouth daily.  30 tablet  11  . pioglitazone-metformin (ACTOPLUS MET) 15-500 MG per tablet Take 1 tablet by mouth 2 (two) times daily with a meal.  180 tablet  3  . simvastatin (ZOCOR) 20 MG tablet Take 1 tablet (20 mg total) by mouth at bedtime.  90 tablet  3  . warfarin (COUMADIN) 5 MG tablet Take 1 tablet (5 mg total) by mouth daily. And on Tuesday pt takes 7.5 mg.  100 tablet  3     Review of Systems  Constitutional: Negative.   HENT: Negative.   Eyes: Negative.   Gastrointestinal: Negative.   Skin: Negative.   Neurological: Negative.  Hematological: Negative.   Psychiatric/Behavioral: Negative.   All other systems reviewed and are negative.   BP 92/58  Pulse 115  Ht 5\' 10"  (1.778 m)  Wt 200 lb (90.719 kg)  BMI 28.70 kg/m2   Physical Exam  Nursing note and vitals reviewed. Constitutional: He is oriented to person, place, and time. He appears well-developed and well-nourished.  HENT:  Head: Normocephalic.  Nose: Nose normal.  Mouth/Throat: Oropharynx is clear and moist.  Eyes: Conjunctivae are normal. Pupils are equal, round, and reactive to light.  Neck: Normal range of motion. Neck supple. No JVD present.  Cardiovascular: S1 normal, S2 normal, normal  heart sounds and intact distal pulses.  An irregularly irregular rhythm present. Exam reveals no gallop and no friction rub.   No murmur heard. Pulmonary/Chest: Effort normal and breath sounds normal. No respiratory distress. He has no wheezes. He has no rales. He exhibits no tenderness.  Abdominal: Soft. Bowel sounds are normal. He exhibits no distension. There is no tenderness.  Musculoskeletal: Normal range of motion. He exhibits no edema and no tenderness.  Lymphadenopathy:    He has no cervical adenopathy.  Neurological: He is alert and oriented to person, place, and time. Coordination normal.  Skin: Skin is warm and dry. No rash noted. No erythema.  Psychiatric: He has a normal mood and affect. His behavior is normal. Judgment and thought content normal.           Assessment and Plan

## 2011-11-19 NOTE — Assessment & Plan Note (Signed)
Improved breathing, improved edema. We'll continue current medication management with a decrease in his lisinopril to 5 mg daily. He is asymptomatic the blood pressure is particularly low.

## 2011-11-20 LAB — BASIC METABOLIC PANEL
BUN/Creatinine Ratio: 28 — ABNORMAL HIGH (ref 10–22)
BUN: 39 mg/dL — ABNORMAL HIGH (ref 8–27)
CO2: 21 mmol/L (ref 20–32)
Calcium: 9.1 mg/dL (ref 8.6–10.2)
Chloride: 102 mmol/L (ref 97–108)
Creatinine, Ser: 1.37 mg/dL — ABNORMAL HIGH (ref 0.76–1.27)
GFR calc Af Amer: 59 mL/min/{1.73_m2} — ABNORMAL LOW (ref >59)
GFR calc non Af Amer: 51 mL/min/{1.73_m2} — ABNORMAL LOW (ref >59)
Glucose: 103 mg/dL — ABNORMAL HIGH (ref 65–99)
Potassium: 5 mmol/L (ref 3.5–5.2)
Sodium: 139 mmol/L (ref 134–144)

## 2011-11-20 LAB — BRAIN NATRIURETIC PEPTIDE: BNP: 810.8 pg/mL — ABNORMAL HIGH (ref 0.0–100.0)

## 2011-12-14 ENCOUNTER — Ambulatory Visit (INDEPENDENT_AMBULATORY_CARE_PROVIDER_SITE_OTHER): Payer: Medicare Other | Admitting: Family Medicine

## 2011-12-14 DIAGNOSIS — I4891 Unspecified atrial fibrillation: Secondary | ICD-10-CM

## 2011-12-14 DIAGNOSIS — Z5181 Encounter for therapeutic drug level monitoring: Secondary | ICD-10-CM

## 2011-12-14 DIAGNOSIS — Z7901 Long term (current) use of anticoagulants: Secondary | ICD-10-CM

## 2011-12-14 LAB — POCT INR: INR: 2.3

## 2011-12-14 NOTE — Patient Instructions (Signed)
Continue current dose, check in 4 weeks  

## 2012-01-11 ENCOUNTER — Ambulatory Visit (INDEPENDENT_AMBULATORY_CARE_PROVIDER_SITE_OTHER): Payer: Medicare Other | Admitting: Family Medicine

## 2012-01-11 DIAGNOSIS — Z5181 Encounter for therapeutic drug level monitoring: Secondary | ICD-10-CM

## 2012-01-11 DIAGNOSIS — I4891 Unspecified atrial fibrillation: Secondary | ICD-10-CM

## 2012-01-11 DIAGNOSIS — Z7901 Long term (current) use of anticoagulants: Secondary | ICD-10-CM

## 2012-01-11 LAB — POCT INR: INR: 3.7

## 2012-01-11 NOTE — Progress Notes (Signed)
Goal should be 2-3.  3.7 is too high.  Will ask Terri to change - daily 6mg .Marland Kitchen

## 2012-01-11 NOTE — Patient Instructions (Addendum)
5 MG DAILY, also, changed his range to 2.0-3.0, patient notified  check in 1 week( patient has been taking 5 mg daily, 7.5 mg on Tuesdays)

## 2012-01-13 ENCOUNTER — Other Ambulatory Visit: Payer: Self-pay | Admitting: *Deleted

## 2012-01-13 MED ORDER — WARFARIN SODIUM 5 MG PO TABS: 5.0000 mg | ORAL_TABLET | Freq: Every day | ORAL | Status: DC

## 2012-01-18 ENCOUNTER — Ambulatory Visit (INDEPENDENT_AMBULATORY_CARE_PROVIDER_SITE_OTHER): Payer: Medicare Other | Admitting: Family Medicine

## 2012-01-18 DIAGNOSIS — I4891 Unspecified atrial fibrillation: Secondary | ICD-10-CM

## 2012-01-18 DIAGNOSIS — Z7901 Long term (current) use of anticoagulants: Secondary | ICD-10-CM

## 2012-01-18 DIAGNOSIS — Z5181 Encounter for therapeutic drug level monitoring: Secondary | ICD-10-CM

## 2012-01-18 LAB — POCT INR: INR: 3

## 2012-01-18 NOTE — Patient Instructions (Signed)
Continue current dose, check in 4 weeks  

## 2012-01-25 ENCOUNTER — Encounter: Payer: Self-pay | Admitting: *Deleted

## 2012-01-25 ENCOUNTER — Other Ambulatory Visit (INDEPENDENT_AMBULATORY_CARE_PROVIDER_SITE_OTHER): Payer: Medicare Other

## 2012-01-25 DIAGNOSIS — E119 Type 2 diabetes mellitus without complications: Secondary | ICD-10-CM

## 2012-01-25 LAB — HEMOGLOBIN A1C: Hgb A1c MFr Bld: 6.3 % (ref 4.6–6.5)

## 2012-02-01 ENCOUNTER — Ambulatory Visit: Payer: Medicare Other | Admitting: Family Medicine

## 2012-02-05 ENCOUNTER — Encounter: Payer: Self-pay | Admitting: Family Medicine

## 2012-02-05 ENCOUNTER — Ambulatory Visit (INDEPENDENT_AMBULATORY_CARE_PROVIDER_SITE_OTHER): Payer: Medicare Other | Admitting: Family Medicine

## 2012-02-05 VITALS — BP 112/64 | HR 71 | Temp 97.8°F | Wt 196.0 lb

## 2012-02-05 DIAGNOSIS — Z7901 Long term (current) use of anticoagulants: Secondary | ICD-10-CM

## 2012-02-05 DIAGNOSIS — I4891 Unspecified atrial fibrillation: Secondary | ICD-10-CM

## 2012-02-05 DIAGNOSIS — E119 Type 2 diabetes mellitus without complications: Secondary | ICD-10-CM

## 2012-02-05 DIAGNOSIS — Z5181 Encounter for therapeutic drug level monitoring: Secondary | ICD-10-CM

## 2012-02-05 DIAGNOSIS — Z0289 Encounter for other administrative examinations: Secondary | ICD-10-CM

## 2012-02-05 LAB — POCT INR: INR: 2.9

## 2012-02-05 MED ORDER — LISINOPRIL 2.5 MG PO TABS: 2.5000 mg | ORAL_TABLET | Freq: Every day | ORAL | Status: DC

## 2012-02-05 NOTE — Patient Instructions (Signed)
Cut back to 2.5mg  of lisinopril a day and if you still have trouble on standing, the stop the medicine.   Go see Camelia Eng on the way out about your coumadin.  Recheck labs in 6 months before a visit.  Take care.

## 2012-02-05 NOTE — Progress Notes (Signed)
Diabetes:  Using medications without difficulties: yes Hypoglycemic episodes: no Hyperglycemic episodes:no Feet problems:no Blood Sugars averaging: ~100  No CP, SOB, BLE edema.  Feeling well except for minimal orthostatic sx with standing.  We discussed his ACE dose.    Meds, vitals, and allergies reviewed.   ROS: See HPI.  Otherwise negative.    GEN: nad, alert and oriented HEENT: mucous membranes moist NECK: supple w/o LA CV: IRR, not tachy PULM: ctab, no inc wob ABD: soft, +bs EXT: no edema SKIN: no acute rash  Diabetic foot exam: Normal inspection No skin breakdown No calluses  Normal DP pulses Normal sensation to light touch and monofilament Nails normal

## 2012-02-05 NOTE — Patient Instructions (Signed)
Continue 5 MG DAILY, recheck 4 weeks

## 2012-02-07 NOTE — Assessment & Plan Note (Addendum)
Controlled, continue as is with DM2 meds.  BP is not elevated, minimal orthostatic sx so will dec ACE.  D/w pt about stopping med is sx continue he agrees.  Nontoxic, f/u later in 2013

## 2012-02-12 ENCOUNTER — Ambulatory Visit: Payer: Medicare Other

## 2012-03-04 ENCOUNTER — Ambulatory Visit (INDEPENDENT_AMBULATORY_CARE_PROVIDER_SITE_OTHER): Payer: Medicare Other | Admitting: Family Medicine

## 2012-03-04 DIAGNOSIS — Z7901 Long term (current) use of anticoagulants: Secondary | ICD-10-CM

## 2012-03-04 DIAGNOSIS — I4891 Unspecified atrial fibrillation: Secondary | ICD-10-CM

## 2012-03-04 DIAGNOSIS — Z5181 Encounter for therapeutic drug level monitoring: Secondary | ICD-10-CM

## 2012-03-04 LAB — POCT INR: INR: 3

## 2012-03-04 NOTE — Patient Instructions (Signed)
Continue current dose, check in 4 weeks  

## 2012-03-28 ENCOUNTER — Telehealth: Payer: Self-pay | Admitting: Family Medicine

## 2012-03-28 DIAGNOSIS — H9190 Unspecified hearing loss, unspecified ear: Secondary | ICD-10-CM

## 2012-03-28 NOTE — Telephone Encounter (Signed)
Done

## 2012-03-28 NOTE — Telephone Encounter (Signed)
Pt came by the office to let us know that he set up an appointment [facility] on Tuesday [facility]/05/12 at 3 pm at [facility]. They are wanting an referral to be put in for the patient.

## 2012-04-01 ENCOUNTER — Ambulatory Visit (INDEPENDENT_AMBULATORY_CARE_PROVIDER_SITE_OTHER): Payer: Medicare Other | Admitting: Family Medicine

## 2012-04-01 DIAGNOSIS — Z7901 Long term (current) use of anticoagulants: Secondary | ICD-10-CM

## 2012-04-01 DIAGNOSIS — I4891 Unspecified atrial fibrillation: Secondary | ICD-10-CM

## 2012-04-01 LAB — POCT INR: INR: 2.2

## 2012-04-01 NOTE — Patient Instructions (Signed)
Continue current dose, check in 4 weeks  

## 2012-04-29 ENCOUNTER — Ambulatory Visit: Payer: Medicare Other

## 2012-04-29 ENCOUNTER — Ambulatory Visit (INDEPENDENT_AMBULATORY_CARE_PROVIDER_SITE_OTHER): Payer: Medicare Other | Admitting: Family Medicine

## 2012-04-29 DIAGNOSIS — I4891 Unspecified atrial fibrillation: Secondary | ICD-10-CM

## 2012-04-29 DIAGNOSIS — Z5181 Encounter for therapeutic drug level monitoring: Secondary | ICD-10-CM

## 2012-04-29 DIAGNOSIS — Z7901 Long term (current) use of anticoagulants: Secondary | ICD-10-CM

## 2012-04-29 LAB — POCT INR: INR: 2.8

## 2012-04-29 NOTE — Patient Instructions (Signed)
Continue current dose, check in 4 weeks  

## 2012-05-10 ENCOUNTER — Encounter: Payer: Self-pay | Admitting: Family Medicine

## 2012-05-25 ENCOUNTER — Encounter: Payer: Self-pay | Admitting: Cardiovascular Disease

## 2012-05-25 ENCOUNTER — Ambulatory Visit (INDEPENDENT_AMBULATORY_CARE_PROVIDER_SITE_OTHER): Payer: Medicare Other | Admitting: Cardiovascular Disease

## 2012-05-25 VITALS — BP 98/72 | HR 78 | Ht 70.0 in | Wt 199.5 lb

## 2012-05-25 DIAGNOSIS — E785 Hyperlipidemia, unspecified: Secondary | ICD-10-CM

## 2012-05-25 DIAGNOSIS — I509 Heart failure, unspecified: Secondary | ICD-10-CM

## 2012-05-25 DIAGNOSIS — I10 Essential (primary) hypertension: Secondary | ICD-10-CM

## 2012-05-25 DIAGNOSIS — I5022 Chronic systolic (congestive) heart failure: Secondary | ICD-10-CM

## 2012-05-25 DIAGNOSIS — F17200 Nicotine dependence, unspecified, uncomplicated: Secondary | ICD-10-CM

## 2012-05-25 DIAGNOSIS — I4891 Unspecified atrial fibrillation: Secondary | ICD-10-CM

## 2012-05-25 DIAGNOSIS — E119 Type 2 diabetes mellitus without complications: Secondary | ICD-10-CM

## 2012-05-25 NOTE — Progress Notes (Signed)
Patient ID: Benjamin Soto, male    DOB: 1936-04-24, 76 y.o.   MRN: [id number]  HPI Comments: Benjamin Soto is a pleasant 76 year old gentleman with a history of chronic atrial fibrillation, long smoking hx, heavy ETOH hx, remote history of SVT, with severe arthritis in his knees, who has been on warfarin for at least 5 years, Also with a history of diabetes, hyperlipidemia, who presents for routine followup,  He continues to smoke, drink wine on a regular basis, sometimes in heavy amounts. He is taking his statin and cholesterol is well controlled. His weight has been stable at approximately 200 pounds. He has very rare episodes of dizziness. Blood pressure has been low but otherwise he feels well. he is able to lie flat without shortness of breath and feels at his baseline  Previous echocardiogram shows severely depressed ejection fraction estimated at 25-30%, moderate MR, moderate pulmonary hypertension. CT Scan of his abdomen several months ago showed coronary calcifications.   He does report a remote history of low testosterone and was previously on testosterone supplemental cream. He is uncertain when this was discontinued.  Labs show total cholesterol less than 100, LDL in the 40 range EKG shows atrial fibrillation with rate 105, right bundle branch block, left anterior fascicular block  EKG shows atrial fibrillation with ventricular rate 78 beats per minute, right bundle branch block, nonspecific ST abnormality, left anterior fascicular block   Outpatient Encounter Prescriptions as of 05/25/2012  Medication Sig Dispense Refill  . albuterol (PROVENTIL HFA;VENTOLIN HFA) 108 (90 BASE) MCG/ACT inhaler Inhale 2 puffs into the lungs every 6 (six) hours as needed for wheezing.  1 Inhaler  1  . allopurinol (ZYLOPRIM) 100 MG tablet Take 1 tablet (100 mg total) by mouth daily.  90 tablet  3  . carvedilol (COREG) 12.5 MG tablet Take 1 tablet (12.5 mg total) by mouth 2 (two) times daily.  30 tablet  11    . furosemide (LASIX) 40 MG tablet Take 40 mg by mouth daily as needed.      Marland Kitchen glucose blood (ONE TOUCH TEST STRIPS) test strip Use once daily       . lisinopril (ZESTRIL) 2.5 MG tablet Take 1 tablet (2.5 mg total) by mouth daily.  90 tablet  3  . NON FORMULARY Energy Greens Daily       . pioglitazone-metformin (ACTOPLUS MET) 15-500 MG per tablet Take 1 tablet by mouth 2 (two) times daily with a meal.  180 tablet  3  . simvastatin (ZOCOR) 20 MG tablet Take 1 tablet (20 mg total) by mouth at bedtime.  90 tablet  3  . warfarin (COUMADIN) 5 MG tablet Take 1 tablet (5 mg total) by mouth daily. And on Tuesday pt takes 7.5 mg.  100 tablet  5  . DISCONTD: furosemide (LASIX) 40 MG tablet Take 1 tablet (40 mg total) by mouth daily.  30 tablet  6     Review of Systems  Constitutional: Negative.   HENT: Negative.   Eyes: Negative.   Respiratory: Negative.   Cardiovascular: Negative.   Gastrointestinal: Negative.   Musculoskeletal: Negative.   Skin: Negative.   Neurological: Negative.   Hematological: Negative.   Psychiatric/Behavioral: Negative.   All other systems reviewed and are negative.   BP 98/72  Pulse 78  Ht 5\' 10"  (1.778 m)  Wt 199 lb 8 oz (90.493 kg)  BMI 28.63 kg/m2   Physical Exam  Nursing note and vitals reviewed. Constitutional: He is oriented to person, place,  and time. He appears well-developed and well-nourished.  HENT:  Head: Normocephalic.  Nose: Nose normal.  Mouth/Throat: Oropharynx is clear and moist.  Eyes: Conjunctivae are normal. Pupils are equal, round, and reactive to light.  Neck: Normal range of motion. Neck supple. No JVD present.  Cardiovascular: S1 normal, S2 normal, normal heart sounds and intact distal pulses.  An irregularly irregular rhythm present. Exam reveals no gallop and no friction rub.   No murmur heard. Pulmonary/Chest: Effort normal and breath sounds normal. No respiratory distress. He has no wheezes. He has no rales. He exhibits no  tenderness.  Abdominal: Soft. Bowel sounds are normal. He exhibits no distension. There is no tenderness.  Musculoskeletal: Normal range of motion. He exhibits no edema and no tenderness.  Lymphadenopathy:    He has no cervical adenopathy.  Neurological: He is alert and oriented to person, place, and time. Coordination normal.  Skin: Skin is warm and dry. No rash noted. No erythema.  Psychiatric: He has a normal mood and affect. His behavior is normal. Judgment and thought content normal.           Assessment and Plan

## 2012-05-25 NOTE — Assessment & Plan Note (Signed)
We have suggested he stay on his statin 

## 2012-05-25 NOTE — Assessment & Plan Note (Signed)
We have encouraged continued exercise, careful diet management in an effort to lose weight. 

## 2012-05-25 NOTE — Assessment & Plan Note (Signed)
He does not want Chantix given problems in the past. We have suggested the electronic cigarette.

## 2012-05-25 NOTE — Assessment & Plan Note (Signed)
No changes to his medications. We'll continue low-dose beta blocker and ACE inhibitor.

## 2012-05-25 NOTE — Assessment & Plan Note (Signed)
Weight is stable. We have asked him to keep his weight less than 200 pounds. Continue on Lasix, watch his salt and fluid intake, especially his alcohol intake. Etiology of his cardiomyopathy could in fact be secondary to chronic alcohol abuse.

## 2012-05-25 NOTE — Assessment & Plan Note (Signed)
Chronic atrial fibrillation, rate is reasonably well controlled. On anticoagulation.

## 2012-05-25 NOTE — Patient Instructions (Addendum)
You are doing well. No medication changes were made.  Please monitor your weight and keep it less than 200 pounds Call for shortness of breath or edema, take lasix Stop smoking!   Limit drinking!  Please call us if you have new issues that need to be addressed before your next appt.  Your physician wants you to follow-up in: 6 months.  You will receive a reminder letter in the mail two months in advance. If you don't receive a letter, please call our office to schedule the follow-up appointment.

## 2012-05-27 ENCOUNTER — Ambulatory Visit (INDEPENDENT_AMBULATORY_CARE_PROVIDER_SITE_OTHER): Payer: Medicare Other | Admitting: Family Medicine

## 2012-05-27 DIAGNOSIS — Z7901 Long term (current) use of anticoagulants: Secondary | ICD-10-CM

## 2012-05-27 DIAGNOSIS — I4891 Unspecified atrial fibrillation: Secondary | ICD-10-CM

## 2012-05-27 DIAGNOSIS — Z5181 Encounter for therapeutic drug level monitoring: Secondary | ICD-10-CM

## 2012-05-27 LAB — POCT INR: INR: 2.7

## 2012-05-27 NOTE — Patient Instructions (Signed)
Continue current dose, check in 4 weeks  

## 2012-06-09 ENCOUNTER — Telehealth: Payer: Self-pay

## 2012-06-09 NOTE — Telephone Encounter (Signed)
Gene with Dr Leretha Pol, opthamologist; pt seen 06/07/12 for annual exam; no problem other than pt noted double vision. Pt stated had for several years after fall in [place]; pt could control double vision by moving head or resting eyes. Dr Darel Hong wanted Dr Para March to be aware.pt was not complaining of h/a or dizziness.

## 2012-06-09 NOTE — Telephone Encounter (Signed)
Noted, please get copy of eye clinic notes.

## 2012-06-10 NOTE — Telephone Encounter (Signed)
Note requested.  Will fax.

## 2012-06-10 NOTE — Telephone Encounter (Signed)
Placed note in your in box.

## 2012-06-15 ENCOUNTER — Telehealth: Payer: Self-pay | Admitting: Family Medicine

## 2012-06-15 NOTE — Telephone Encounter (Signed)
Patient's wife returned Benjamin Soto's call to make appointment with Dr.Duncan to discuss patient's double vision.  Patient said it's been going on for a very long time and would like to wait to discuss it with Dr.Duncan at his November follow up appointment.

## 2012-06-16 ENCOUNTER — Ambulatory Visit (INDEPENDENT_AMBULATORY_CARE_PROVIDER_SITE_OTHER): Payer: Medicare Other

## 2012-06-16 DIAGNOSIS — Z23 Encounter for immunization: Secondary | ICD-10-CM

## 2012-06-24 ENCOUNTER — Ambulatory Visit (INDEPENDENT_AMBULATORY_CARE_PROVIDER_SITE_OTHER): Payer: Medicare Other | Admitting: Family Medicine

## 2012-06-24 DIAGNOSIS — I4891 Unspecified atrial fibrillation: Secondary | ICD-10-CM

## 2012-06-24 DIAGNOSIS — Z7901 Long term (current) use of anticoagulants: Secondary | ICD-10-CM

## 2012-06-24 DIAGNOSIS — Z5181 Encounter for therapeutic drug level monitoring: Secondary | ICD-10-CM

## 2012-06-24 LAB — POCT INR: INR: 3.4

## 2012-06-24 NOTE — Patient Instructions (Signed)
Hold 1 dose then resume 5 MG DAILY, recheck 2 weeks

## 2012-07-08 ENCOUNTER — Ambulatory Visit (INDEPENDENT_AMBULATORY_CARE_PROVIDER_SITE_OTHER): Payer: Medicare Other | Admitting: Family Medicine

## 2012-07-08 DIAGNOSIS — Z7901 Long term (current) use of anticoagulants: Secondary | ICD-10-CM

## 2012-07-08 DIAGNOSIS — Z5181 Encounter for therapeutic drug level monitoring: Secondary | ICD-10-CM

## 2012-07-08 DIAGNOSIS — I4891 Unspecified atrial fibrillation: Secondary | ICD-10-CM

## 2012-07-08 LAB — POCT INR: INR: 2.7

## 2012-07-08 NOTE — Patient Instructions (Addendum)
5 MG DAILY, 2.5 mg every Friday(reduced dose by 2.5 mg weekly) check in 4 weeks

## 2012-08-01 ENCOUNTER — Other Ambulatory Visit: Payer: Self-pay | Admitting: *Deleted

## 2012-08-01 ENCOUNTER — Other Ambulatory Visit (INDEPENDENT_AMBULATORY_CARE_PROVIDER_SITE_OTHER): Payer: Medicare Other

## 2012-08-01 DIAGNOSIS — I4891 Unspecified atrial fibrillation: Secondary | ICD-10-CM

## 2012-08-01 DIAGNOSIS — E119 Type 2 diabetes mellitus without complications: Secondary | ICD-10-CM

## 2012-08-01 LAB — COMPREHENSIVE METABOLIC PANEL
ALT: 31 U/L (ref 0–53)
AST: 33 U/L (ref 0–37)
Albumin: 3.7 g/dL (ref 3.5–5.2)
Alkaline Phosphatase: 58 U/L (ref 39–117)
BUN: 20 mg/dL (ref 6–23)
CO2: 26 mEq/L (ref 19–32)
Calcium: 8.8 mg/dL (ref 8.4–10.5)
Chloride: 105 mEq/L (ref 96–112)
Creatinine, Ser: 1.2 mg/dL (ref 0.4–1.5)
GFR: 61.08 mL/min (ref >60.00)
Glucose, Bld: 93 mg/dL (ref 70–99)
Potassium: 4.8 mEq/L (ref 3.5–5.1)
Sodium: 139 mEq/L (ref 135–145)
Total Bilirubin: 1 mg/dL (ref 0.3–1.2)
Total Protein: 6.2 g/dL (ref 6.0–8.3)

## 2012-08-01 LAB — HEMOGLOBIN A1C: Hgb A1c MFr Bld: 5.8 % (ref 4.6–6.5)

## 2012-08-01 LAB — LIPID PANEL
Cholesterol: 118 mg/dL (ref 0–200)
HDL: 47.2 mg/dL (ref >39.00)
LDL Cholesterol: 49 mg/dL (ref 0–99)
Total CHOL/HDL Ratio: 3
Triglycerides: 108 mg/dL (ref 0.0–149.0)
VLDL: 21.6 mg/dL (ref 0.0–40.0)

## 2012-08-01 LAB — TSH: TSH: 2.3 u[IU]/mL (ref 0.35–5.50)

## 2012-08-01 MED ORDER — ALLOPURINOL 100 MG PO TABS: 100.0000 mg | ORAL_TABLET | Freq: Every day | ORAL | Status: DC

## 2012-08-08 ENCOUNTER — Encounter: Payer: Self-pay | Admitting: Family Medicine

## 2012-08-08 ENCOUNTER — Ambulatory Visit (INDEPENDENT_AMBULATORY_CARE_PROVIDER_SITE_OTHER): Payer: Medicare Other | Admitting: Family Medicine

## 2012-08-08 VITALS — BP 108/70 | HR 79 | Temp 97.5°F | Wt 202.8 lb

## 2012-08-08 DIAGNOSIS — I4891 Unspecified atrial fibrillation: Secondary | ICD-10-CM

## 2012-08-08 DIAGNOSIS — Z5181 Encounter for therapeutic drug level monitoring: Secondary | ICD-10-CM

## 2012-08-08 DIAGNOSIS — Z7901 Long term (current) use of anticoagulants: Secondary | ICD-10-CM

## 2012-08-08 DIAGNOSIS — E119 Type 2 diabetes mellitus without complications: Secondary | ICD-10-CM

## 2012-08-08 DIAGNOSIS — E785 Hyperlipidemia, unspecified: Secondary | ICD-10-CM

## 2012-08-08 LAB — POCT INR: INR: 1.9

## 2012-08-08 MED ORDER — ALLOPURINOL 100 MG PO TABS: 100.0000 mg | ORAL_TABLET | Freq: Every day | ORAL | Status: DC

## 2012-08-08 MED ORDER — CARVEDILOL 12.5 MG PO TABS: 12.5000 mg | ORAL_TABLET | Freq: Two times a day (BID) | ORAL | Status: DC

## 2012-08-08 MED ORDER — LISINOPRIL 2.5 MG PO TABS: 2.5000 mg | ORAL_TABLET | Freq: Every day | ORAL | Status: DC

## 2012-08-08 MED ORDER — SIMVASTATIN 20 MG PO TABS: 20.0000 mg | ORAL_TABLET | Freq: Every day | ORAL | Status: DC

## 2012-08-08 MED ORDER — WARFARIN SODIUM 5 MG PO TABS: 5.0000 mg | ORAL_TABLET | Freq: Every day | ORAL | Status: DC

## 2012-08-08 MED ORDER — ALLOPURINOL 300 MG PO TABS: 300.0000 mg | ORAL_TABLET | Freq: Every day | ORAL | Status: DC

## 2012-08-08 MED ORDER — METFORMIN HCL 500 MG PO TABS: 500.0000 mg | ORAL_TABLET | Freq: Two times a day (BID) | ORAL | Status: DC

## 2012-08-08 MED ORDER — FUROSEMIDE 40 MG PO TABS: 40.0000 mg | ORAL_TABLET | Freq: Every day | ORAL | Status: DC | PRN

## 2012-08-08 NOTE — Progress Notes (Signed)
Diabetes:  Using medications without difficulties:yes Hypoglycemic episodes:no Hyperglycemic episodes:no Feet problems:no Blood Sugars averaging: ~100 Diet discussed along with labs.   Elevated Cholesterol: Using medications without problems: yes Muscle aches: no Diet compliance: partial Exercise:some  Afib, no bleeding, palpitation, chest pain.  Feeling well.  Still with 1-2 glasses of wine daily.    PMH and SH reviewed.   Vital signs, Meds and allergies reviewed.  ROS: See HPI.  Otherwise nontributory.   GEN: nad, alert and oriented HEENT: mucous membranes moist NECK: supple w/o LA CV: IRR PULM: ctab, no inc wob ABD: soft, +bs EXT: no edema SKIN: no acute rash  Diabetic foot exam: Normal inspection No skin breakdown No calluses  Normal DP pulses Normal sensation to light tough and monofilament Nails normal

## 2012-08-08 NOTE — Assessment & Plan Note (Signed)
Not tachy, feeling well. Continue as is.

## 2012-08-08 NOTE — Assessment & Plan Note (Signed)
Will stop actos, change to plain metformin.  He'll use up remaining actos and then make the change.  Continue work on diet, discussed.  Recheck labs in 6 months.

## 2012-08-08 NOTE — Patient Instructions (Addendum)
Schedule a physical in 6 months.  Labs ahead of time.  Stop the metoprolol.  Keep taking carvedilol.   Finish the actoplus/met.  When you are done with it, switch to plain metformin.  Take care.

## 2012-08-08 NOTE — Assessment & Plan Note (Signed)
Improved, continue current meds 

## 2012-08-08 NOTE — Patient Instructions (Signed)
Continue current dose, check in 4 weeks  

## 2012-08-09 ENCOUNTER — Other Ambulatory Visit: Payer: Self-pay | Admitting: *Deleted

## 2012-09-06 ENCOUNTER — Ambulatory Visit (INDEPENDENT_AMBULATORY_CARE_PROVIDER_SITE_OTHER): Payer: Medicare Other | Admitting: General Practice

## 2012-09-06 DIAGNOSIS — I4891 Unspecified atrial fibrillation: Secondary | ICD-10-CM

## 2012-09-06 DIAGNOSIS — Z5181 Encounter for therapeutic drug level monitoring: Secondary | ICD-10-CM

## 2012-09-06 DIAGNOSIS — Z7901 Long term (current) use of anticoagulants: Secondary | ICD-10-CM

## 2012-09-06 LAB — POCT INR: INR: 2.9

## 2012-09-21 DIAGNOSIS — S42401A Unspecified fracture of lower end of right humerus, initial encounter for closed fracture: Secondary | ICD-10-CM

## 2012-09-21 HISTORY — DX: Unspecified fracture of lower end of right humerus, initial encounter for closed fracture: S42.401A

## 2012-10-03 ENCOUNTER — Ambulatory Visit (INDEPENDENT_AMBULATORY_CARE_PROVIDER_SITE_OTHER): Payer: Medicare Other | Admitting: General Practice

## 2012-10-03 DIAGNOSIS — Z5181 Encounter for therapeutic drug level monitoring: Secondary | ICD-10-CM

## 2012-10-03 DIAGNOSIS — Z7901 Long term (current) use of anticoagulants: Secondary | ICD-10-CM

## 2012-10-03 DIAGNOSIS — I4891 Unspecified atrial fibrillation: Secondary | ICD-10-CM

## 2012-10-03 LAB — POCT INR: INR: 2.3

## 2012-10-11 ENCOUNTER — Telehealth: Payer: Self-pay | Admitting: Family Medicine

## 2012-10-11 DIAGNOSIS — J3489 Other specified disorders of nose and nasal sinuses: Secondary | ICD-10-CM

## 2012-10-11 DIAGNOSIS — H04209 Unspecified epiphora, unspecified lacrimal gland: Secondary | ICD-10-CM

## 2012-10-11 NOTE — Telephone Encounter (Signed)
Pt called about frequent tearing of eyes and persistent rhinorrhea.  Wanted ENT eval, [place].  Referral placed, but please make sure he's at least tried otc antihistamines ie claritin before going.  Thanks.

## 2012-10-31 ENCOUNTER — Ambulatory Visit (INDEPENDENT_AMBULATORY_CARE_PROVIDER_SITE_OTHER): Payer: Medicare Other | Admitting: General Practice

## 2012-10-31 DIAGNOSIS — I4891 Unspecified atrial fibrillation: Secondary | ICD-10-CM

## 2012-10-31 DIAGNOSIS — Z5181 Encounter for therapeutic drug level monitoring: Secondary | ICD-10-CM

## 2012-10-31 DIAGNOSIS — Z7901 Long term (current) use of anticoagulants: Secondary | ICD-10-CM

## 2012-10-31 LAB — POCT INR: INR: 2.4

## 2012-11-29 ENCOUNTER — Ambulatory Visit (INDEPENDENT_AMBULATORY_CARE_PROVIDER_SITE_OTHER): Payer: Medicare Other | Admitting: Cardiovascular Disease

## 2012-11-29 ENCOUNTER — Encounter: Payer: Self-pay | Admitting: Cardiovascular Disease

## 2012-11-29 VITALS — BP 104/62 | HR 92 | Ht 69.0 in | Wt 212.0 lb

## 2012-11-29 DIAGNOSIS — I5022 Chronic systolic (congestive) heart failure: Secondary | ICD-10-CM

## 2012-11-29 DIAGNOSIS — E785 Hyperlipidemia, unspecified: Secondary | ICD-10-CM

## 2012-11-29 DIAGNOSIS — I479 Paroxysmal tachycardia, unspecified: Secondary | ICD-10-CM

## 2012-11-29 DIAGNOSIS — I10 Essential (primary) hypertension: Secondary | ICD-10-CM

## 2012-11-29 DIAGNOSIS — I4891 Unspecified atrial fibrillation: Secondary | ICD-10-CM

## 2012-11-29 DIAGNOSIS — E119 Type 2 diabetes mellitus without complications: Secondary | ICD-10-CM

## 2012-11-29 DIAGNOSIS — I509 Heart failure, unspecified: Secondary | ICD-10-CM

## 2012-11-29 NOTE — Assessment & Plan Note (Signed)
Appears relatively euvolemic. No edema, lungs are clear. No significant shortness of breath. Taking Lasix several times per week

## 2012-11-29 NOTE — Assessment & Plan Note (Signed)
We have encouraged continued exercise, careful diet management in an effort to lose weight. 

## 2012-11-29 NOTE — Assessment & Plan Note (Signed)
We have suggested he stay on his statin 

## 2012-11-29 NOTE — Assessment & Plan Note (Signed)
Chronic atrial fibrillation. Rate relatively well-controlled. On warfarin

## 2012-11-29 NOTE — Patient Instructions (Addendum)
You are doing well. No medication changes were made.  Please call us if you have new issues that need to be addressed before your next appt.  Your physician wants you to follow-up in: 6 months.  You will receive a reminder letter in the mail two months in advance. If you don't receive a letter, please call our office to schedule the follow-up appointment.   

## 2012-11-29 NOTE — Progress Notes (Signed)
Patient ID: Benjamin Soto, male    DOB: [birth year], 77 y.o.   MRN: [id number]  HPI Comments: Benjamin Soto is a pleasant 77 year old gentleman with a history of chronic atrial fibrillation, long smoking hx who continues to smoke, heavy ETOH hx, remote history of SVT, with severe arthritis in his knees,  on warfarin ,  with a history of diabetes, hyperlipidemia, who presents for routine followup,  He continues to smoke, drink wine on a regular basis, sometimes in heavy amounts. He is taking his statin and cholesterol is well controlled. His weight is higher than his baseline as he has not been active and no exercise. Previously he had rare episodes of dizziness. Blood pressure in the past has been low but otherwise he feels well. He takes Lasix periodically, sometimes twice per week for leg edema.  Previous echocardiogram shows severely depressed ejection fraction estimated at 25-30%, moderate MR, moderate pulmonary hypertension. CT Scan of his abdomen several months ago showed coronary calcifications.   He does report a remote history of low testosterone and was previously on testosterone supplemental cream. He is uncertain when this was discontinued.  Labs show total cholesterol less than 100, LDL in the 40 range EKG shows atrial fibrillation with rate 105, right bundle branch block, left anterior fascicular block  EKG shows atrial fibrillation with ventricular rate 92 beats per minute, right bundle branch block, nonspecific ST abnormality, left anterior fascicular block   Outpatient Encounter Prescriptions as of 11/29/2012  Medication Sig Dispense Refill  . allopurinol (ZYLOPRIM) 100 MG tablet Take 1 tablet (100 mg total) by mouth daily.  90 tablet  3  . allopurinol (ZYLOPRIM) 300 MG tablet Take 1 tablet (300 mg total) by mouth daily.  90 tablet  3  . carvedilol (COREG) 12.5 MG tablet Take 1 tablet (12.5 mg total) by mouth 2 (two) times daily.  180 tablet  3  . furosemide (LASIX) 40 MG tablet  Take 1 tablet (40 mg total) by mouth daily as needed.  90 tablet  3  . glucose blood (ONE TOUCH TEST STRIPS) test strip Use once daily       . lisinopril (ZESTRIL) 2.5 MG tablet Take 1 tablet (2.5 mg total) by mouth daily.  90 tablet  3  . metFORMIN (GLUCOPHAGE) 500 MG tablet Take 1 tablet (500 mg total) by mouth 2 (two) times daily with a meal.  180 tablet  3  . metoprolol (LOPRESSOR) 50 MG tablet Take 50 mg by mouth daily.      . NON FORMULARY Energy Greens Daily       . simvastatin (ZOCOR) 20 MG tablet Take 1 tablet (20 mg total) by mouth at bedtime.  90 tablet  3  . warfarin (COUMADIN) 5 MG tablet Take 1 tablet (5 mg total) by mouth daily. And on Friday pt takes 2.5 mg or as directed  100 tablet  5  . albuterol (PROVENTIL HFA;VENTOLIN HFA) 108 (90 BASE) MCG/ACT inhaler Inhale 2 puffs into the lungs every 6 (six) hours as needed for wheezing.  1 Inhaler  1   No facility-administered encounter medications on file as of 11/29/2012.     Review of Systems  Constitutional: Negative.   HENT: Negative.   Eyes: Negative.   Respiratory: Negative.   Cardiovascular: Negative.   Gastrointestinal: Negative.   Musculoskeletal: Negative.   Skin: Negative.   Neurological: Negative.   Psychiatric/Behavioral: Negative.   All other systems reviewed and are negative.   BP 104/62  Pulse 92  Ht 5\' 9"  (1.753 m)  Wt 212 lb (96.163 kg)  BMI 31.29 kg/m2   Physical Exam  Nursing note and vitals reviewed. Constitutional: He is oriented to person, place, and time. He appears well-developed and well-nourished.  HENT:  Head: Normocephalic.  Nose: Nose normal.  Mouth/Throat: Oropharynx is clear and moist.  Eyes: Conjunctivae are normal. Pupils are equal, round, and reactive to light.  Neck: Normal range of motion. Neck supple. No JVD present.  Cardiovascular: S1 normal, S2 normal, normal heart sounds and intact distal pulses.  An irregularly irregular rhythm present. Exam reveals no gallop and no  friction rub.   No murmur heard. Pulmonary/Chest: Effort normal and breath sounds normal. No respiratory distress. He has no wheezes. He has no rales. He exhibits no tenderness.  Abdominal: Soft. Bowel sounds are normal. He exhibits no distension. There is no tenderness.  Musculoskeletal: Normal range of motion. He exhibits no edema and no tenderness.  Lymphadenopathy:    He has no cervical adenopathy.  Neurological: He is alert and oriented to person, place, and time. Coordination normal.  Skin: Skin is warm and dry. No rash noted. No erythema.  Psychiatric: He has a normal mood and affect. His behavior is normal. Judgment and thought content normal.      Assessment and Plan

## 2012-12-12 ENCOUNTER — Ambulatory Visit (INDEPENDENT_AMBULATORY_CARE_PROVIDER_SITE_OTHER): Payer: Medicare Other | Admitting: General Practice

## 2012-12-12 DIAGNOSIS — Z5181 Encounter for therapeutic drug level monitoring: Secondary | ICD-10-CM

## 2012-12-12 DIAGNOSIS — Z7901 Long term (current) use of anticoagulants: Secondary | ICD-10-CM

## 2012-12-12 DIAGNOSIS — I4891 Unspecified atrial fibrillation: Secondary | ICD-10-CM

## 2012-12-12 LAB — POCT INR: INR: 3.9

## 2012-12-22 ENCOUNTER — Encounter: Payer: Self-pay | Admitting: Family Medicine

## 2012-12-22 ENCOUNTER — Telehealth: Payer: Self-pay

## 2012-12-22 ENCOUNTER — Ambulatory Visit (INDEPENDENT_AMBULATORY_CARE_PROVIDER_SITE_OTHER)
Admission: RE | Admit: 2012-12-22 | Discharge: 2012-12-22 | Disposition: A | Discharge status: Home / Self Care | Payer: Medicare Other | Source: Ambulatory Visit | Attending: Family Medicine | Admitting: Family Medicine

## 2012-12-22 ENCOUNTER — Ambulatory Visit (INDEPENDENT_AMBULATORY_CARE_PROVIDER_SITE_OTHER): Payer: Medicare Other | Admitting: Family Medicine

## 2012-12-22 VITALS — BP 120/70 | HR 63 | Temp 97.4°F | Ht 69.0 in | Wt 206.0 lb

## 2012-12-22 DIAGNOSIS — S42409A Unspecified fracture of lower end of unspecified humerus, initial encounter for closed fracture: Secondary | ICD-10-CM

## 2012-12-22 DIAGNOSIS — M25529 Pain in unspecified elbow: Secondary | ICD-10-CM

## 2012-12-22 DIAGNOSIS — S52009A Unspecified fracture of upper end of unspecified ulna, initial encounter for closed fracture: Secondary | ICD-10-CM

## 2012-12-22 DIAGNOSIS — M25521 Pain in right elbow: Secondary | ICD-10-CM

## 2012-12-22 DIAGNOSIS — S42401A Unspecified fracture of lower end of right humerus, initial encounter for closed fracture: Secondary | ICD-10-CM

## 2012-12-22 DIAGNOSIS — S52001A Unspecified fracture of upper end of right ulna, initial encounter for closed fracture: Secondary | ICD-10-CM

## 2012-12-22 MED ORDER — HYDROCODONE-ACETAMINOPHEN 5-325 MG PO TABS: 1.0000 | ORAL_TABLET | Freq: Four times a day (QID) | ORAL | Status: DC | PRN

## 2012-12-22 NOTE — Progress Notes (Signed)
Nature conservation officer at [facility] [address] [place] [address] Phone: [phone] Fax: [phone]  Date:  12/22/2012   Name:  Benjamin Soto   DOB:  1936/05/15   MRN:  [id number] Gender: male Age: 77 y.o.  Primary Physician:  Crawford Givens, MD  Evaluating MD: Hannah Beat, MD   Chief Complaint: Elbow Pain   History of Present Illness:  Benjamin Soto is a 77 y.o. pleasant patient who presents with the following:  Very pleasant gentleman who fell on the point of his right elbow yesterday, when he tripped on his steps in the garage. Now he has loss of motion, pain, and extensive bruising, mostly at the olecranon and pain with flexion and extension.   Also on Coumadin. No hand or arm pain.  DOI: 12/21/2012  Patient Active Problem List  Diagnosis  . DIABETES MELLITUS, TYPE II  . HYPERLIPIDEMIA  . GOUT  . NICOTINE ADDICTION  . DRY EYE SYNDROME  . ESSENTIAL HYPERTENSION, BENIGN  . TACHYCARDIA, PAROXYSMAL NOS  . ATRIAL FIBRILLATION  . RENAL INSUFFICIENCY  . ROSACEA  . FOOT PAIN, BILATERAL  . Erectile dysfunction  . Olecranon bursitis  . Advance directive discussed with patient  . Nasal congestion  . Hypogonadism male  . Hematuria, microscopic  . Diverticulosis of colon  . Wheeze  . Systolic CHF, chronic    Past Medical History  Diagnosis Date  . Atrial fibrillation 01/20/2003  . Diabetes mellitus, type 2 09/21/92  . Gout 04/22/2003    per Dr Gavin Potters  . Hyperlipidemia 05/22/01  . Knee pain, right     [facility], Dr Gavin Potters with rheumatology  . Tachycardia 1993    hospital[place] in [place]  . History of colon polyps 10/26/02    colonoscopy  . Hypogonadism male     Past Surgical History  Procedure Laterality Date  . Cardiac catheterization  1995    [facility]  . Bunionectomy  02/2004    R MTP    History   Social History  . Marital Status: Married    Spouse Name: N/A    Number of Children: 2  . Years of  Education: N/A   Occupational History  . 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 Smoking status: Current Some Day Smoker -- 0.25 packs/day for 0 years    Types: Cigarettes, Pipe    Last Attempt to Quit: 10/05/2011  . Smokeless tobacco: Never Used     Comment: 1/2 pack per week  . Alcohol Use: 0.5 oz/week    1 drink(s) per week     Comment: drinks 1-2 glasses of red wine daily  . Drug Use: No  . Sexually Active: Not on file   Other Topics Concern  . Not on file   Social History Narrative   Married since 1965   Former smoker, quit 1995, 35 pyh, started back as of 2011 with pipe smoking.    Family History  Problem Relation Age of Onset  . Diabetes Father     Allergies  Allergen Reactions  . Lisinopril     REACTION: hyperkalemia at high dose  . Varenicline Tartrate     REACTION: vivid dreams, nausea    Medication list has been reviewed and updated.  Outpatient Prescriptions Prior to Visit  Medication Sig Dispense Refill  . allopurinol (ZYLOPRIM) 100 MG tablet Take 1 tablet (100 mg total) by  mouth daily.  90 tablet  3  . allopurinol (ZYLOPRIM) 300 MG tablet Take 1 tablet (300 mg total) by mouth daily.  90 tablet  3  . carvedilol (COREG) 12.5 MG tablet Take 1 tablet (12.5 mg total) by mouth 2 (two) times daily.  180 tablet  3  . furosemide (LASIX) 40 MG tablet Take 1 tablet (40 mg total) by mouth daily as needed.  90 tablet  3  . glucose blood (ONE TOUCH TEST STRIPS) test strip Use once daily       . lisinopril (ZESTRIL) 2.5 MG tablet Take 1 tablet (2.5 mg total) by mouth daily.  90 tablet  3  . metFORMIN (GLUCOPHAGE) 500 MG tablet Take 1 tablet (500 mg total) by mouth 2 (two) times daily with a meal.  180 tablet  3  . metoprolol (LOPRESSOR) 50 MG tablet Take 50 mg by mouth daily.      . NON FORMULARY Energy Greens Daily       . simvastatin (ZOCOR) 20 MG tablet Take 1 tablet (20 mg total)  by mouth at bedtime.  90 tablet  3  . warfarin (COUMADIN) 5 MG tablet Take 1 tablet (5 mg total) by mouth daily. And on Friday pt takes 2.5 mg or as directed  100 tablet  5  . albuterol (PROVENTIL HFA;VENTOLIN HFA) 108 (90 BASE) MCG/ACT inhaler Inhale 2 puffs into the lungs every 6 (six) hours as needed for wheezing.  1 Inhaler  1   No facility-administered medications prior to visit.    Review of Systems:   GEN: No fevers, chills. Nontoxic. Primarily MSK c/o today. MSK: Detailed in the HPI GI: tolerating PO intake without difficulty Neuro: No numbness, parasthesias, or tingling associated. Otherwise the pertinent positives of the ROS are noted above.    Physical Examination: BP 120/70  Pulse 63  Temp(Src) 97.4 F (36.3 C) (Oral)  Ht 5\' 9"  (1.753 m)  Wt 206 lb (93.441 kg)  BMI 30.41 kg/m2  SpO2 97%  Ideal Body Weight: Weight in (lb) to have BMI = 25: 168.9   GEN: WDWN, NAD, Non-toxic, Alert & Oriented x 3 HEENT: Atraumatic, Normocephalic.  Ears and Nose: No external deformity. EXTR: No clubbing/cyanosis/edema NEURO: Normal gait.  PSYCH: Normally interactive. Conversant. Not depressed or anxious appearing.  Calm demeanor.   R elbow Ecchymosis or edema: diffuse echymosis ROM: marked restriction of Ext and flexion Shoulder ROM: Full Flexion: 5/5 Extension: 5/5 NT along all fingers, MT, distal ulnar and radius, humerus. grip: 4+/5  sensation intact  Dg Elbow Complete Right  12/22/2012  *RADIOLOGY REPORT*  Clinical Data: Pain post trauma  RIGHT ELBOW - COMPLETE 3+ VIEW  Comparison: None.  Findings:  Frontal, lateral, and bilateral oblique views were obtained.  There is a comminuted fracture of the proximal ulna in near anatomic alignment.  There is no other fracture.  No dislocation.  No appreciable joint effusion.  Bones are osteoporotic.  There is no appreciable joint space narrowing.     IMPRESSION:  Comminuted fracture proximal ulna.   Original Report Authenticated By:  Bretta Bang, M.D.     Assessment and Plan:  Fracture, ulna, proximal, right, closed, initial encounter  Right elbow pain - Plan: DG Elbow Complete Right  Elbow fracture, right, closed, initial encounter  Comminuted proximal ulnar fracture that communicates with joint space and involved approx 50% of joint. More complex fracture, and will get Orthopedic involvement.  Placed in a posterior splint at 90 degrees and placed in a  sling without difficulty.  Appt. With Dr. Hyacinth Meeker at 10:45 AM tomorrow.  Orders Today:  Orders Placed This Encounter  Procedures  . DG Elbow Complete Right    Standing Status: Future     Number of Occurrences: 1     Standing Expiration Date: 02/21/2014    Order Specific Question:  Preferred imaging location?    Answer:  [facility]    Order Specific Question:  Reason for exam:    Answer:  fall, concern for fracture    Updated Medication List: (Includes new medications, updates to list, dose adjustments) Meds ordered this encounter  Medications  . HYDROcodone-acetaminophen (NORCO/VICODIN) 5-325 MG per tablet    Sig: Take 1 tablet by mouth every 6 (six) hours as needed for pain.    Dispense:  30 tablet    Refill:  0    Medications Discontinued: There are no discontinued medications.    Signed, Elpidio Galea. Xylah Early, MD 12/22/2012 9:56 AM

## 2012-12-22 NOTE — Telephone Encounter (Signed)
Benjamin Soto with [facility] radiology called report Rt elbow; Dr Patsy Lager noted on result note for rt elbow; pt already seen and immobilized.

## 2013-01-09 ENCOUNTER — Ambulatory Visit (INDEPENDENT_AMBULATORY_CARE_PROVIDER_SITE_OTHER): Payer: Medicare Other | Admitting: General Practice

## 2013-01-09 ENCOUNTER — Ambulatory Visit: Payer: Medicare Other

## 2013-01-09 ENCOUNTER — Encounter: Payer: Self-pay | Admitting: General Practice

## 2013-01-09 DIAGNOSIS — Z5181 Encounter for therapeutic drug level monitoring: Secondary | ICD-10-CM

## 2013-01-09 DIAGNOSIS — I4891 Unspecified atrial fibrillation: Secondary | ICD-10-CM

## 2013-01-09 DIAGNOSIS — Z7901 Long term (current) use of anticoagulants: Secondary | ICD-10-CM

## 2013-01-09 LAB — POCT INR: INR: 4.7

## 2013-01-22 ENCOUNTER — Other Ambulatory Visit: Payer: Self-pay | Admitting: Family Medicine

## 2013-01-22 DIAGNOSIS — E119 Type 2 diabetes mellitus without complications: Secondary | ICD-10-CM

## 2013-01-23 ENCOUNTER — Ambulatory Visit (INDEPENDENT_AMBULATORY_CARE_PROVIDER_SITE_OTHER): Payer: Medicare Other | Admitting: General Practice

## 2013-01-23 DIAGNOSIS — Z7901 Long term (current) use of anticoagulants: Secondary | ICD-10-CM

## 2013-01-23 DIAGNOSIS — I4891 Unspecified atrial fibrillation: Secondary | ICD-10-CM

## 2013-01-23 DIAGNOSIS — Z5181 Encounter for therapeutic drug level monitoring: Secondary | ICD-10-CM

## 2013-01-23 LAB — POCT INR: INR: 2.6

## 2013-02-01 ENCOUNTER — Other Ambulatory Visit: Payer: Medicare Other

## 2013-02-02 ENCOUNTER — Other Ambulatory Visit (INDEPENDENT_AMBULATORY_CARE_PROVIDER_SITE_OTHER): Payer: Medicare Other

## 2013-02-02 DIAGNOSIS — E119 Type 2 diabetes mellitus without complications: Secondary | ICD-10-CM

## 2013-02-02 LAB — HEMOGLOBIN A1C: Hgb A1c MFr Bld: 5.8 % (ref 4.6–6.5)

## 2013-02-03 ENCOUNTER — Other Ambulatory Visit: Payer: Medicare Other

## 2013-02-06 ENCOUNTER — Encounter: Payer: Self-pay | Admitting: Family Medicine

## 2013-02-06 ENCOUNTER — Ambulatory Visit (INDEPENDENT_AMBULATORY_CARE_PROVIDER_SITE_OTHER): Payer: Medicare Other | Admitting: Family Medicine

## 2013-02-06 VITALS — BP 102/60 | HR 73 | Temp 98.1°F | Ht 69.0 in | Wt 206.5 lb

## 2013-02-06 DIAGNOSIS — Z0000 Encounter for general adult medical examination without abnormal findings: Secondary | ICD-10-CM

## 2013-02-06 DIAGNOSIS — E119 Type 2 diabetes mellitus without complications: Secondary | ICD-10-CM

## 2013-02-06 NOTE — Assessment & Plan Note (Signed)
Stable, he'll cut back to plain metformin and recheck A1c in about 6 months, sooner if needed.  He'll notify us prn.

## 2013-02-06 NOTE — Progress Notes (Signed)
I have personally reviewed the Medicare Annual Wellness questionnaire and have noted 1. The patient's medical and social history 2. Their use of alcohol, tobacco or illicit drugs 3. Their current medications and supplements 4. The patient's functional ability including ADL's, fall risks, home safety risks and hearing or visual             impairment. 5. Diet and physical activities 6. Evidence for depression or mood disorders  The patients weight, height, BMI have been recorded in the chart and visual acuity is per eye clinic.  I have made referrals, counseling and provided education to the patient based review of the above and I have provided the pt with a written personalized care plan for preventive services.  See scanned forms.  Routine anticipatory guidance given to patient.  See health maintenance. Flu 2013 Shingles 2012 PNA 2010 Tetanus 2004 Colonoscopy 2009, declined further screening. This is reasonable.  Prostate cancer screening and PSA options (with potential risks and benefits of testing vs not testing) were discussed along with recent recs/guidelines.  He declined testing PSA at this point. Advance directive.  Has a living will. Wife is designated.  Cognitive function addressed- see scanned forms- and if abnormal then additional documentation follows.   Diabetes:  Using medications without difficulties:yes Hypoglycemic episodes:no Hyperglycemic episodes:no Feet problems:no Blood Sugars averaging: ~100 eye exam within last year: yes He's is getting ready to start plain metformin.    He feels well w/o complaints.    PMH and SH reviewed  Meds, vitals, and allergies reviewed.   ROS: See HPI.  Otherwise negative.    GEN: nad, alert and oriented HEENT: mucous membranes moist NECK: supple w/o LA CV: IRR, not tachy PULM: ctab, no inc wob ABD: soft, +bs EXT: no edema SKIN: no acute rash  Diabetic foot exam: Normal inspection No skin breakdown No calluses   Normal DP pulses Normal sensation to light touch and monofilament Nails normal

## 2013-02-06 NOTE — Assessment & Plan Note (Signed)
See scanned forms.  Routine anticipatory guidance given to patient.  See health maintenance. Flu 2013 Shingles 2012 PNA 2010 Tetanus 2004 Colonoscopy 2009, declined further screening. This is reasonable.  Prostate cancer screening and PSA options (with potential risks and benefits of testing vs not testing) were discussed along with recent recs/guidelines.  He declined testing PSA at this point. Advance directive.  Has a living will. Wife is designated.  Cognitive function addressed- see scanned forms- and if abnormal then additional documentation follows.

## 2013-02-06 NOTE — Patient Instructions (Addendum)
We'll plan on recheck in 6 months with labs ahead of time, sooner if needed (if your sugar is up a lot in the meantime). Take care.  Glad to see you.

## 2013-02-20 ENCOUNTER — Ambulatory Visit (INDEPENDENT_AMBULATORY_CARE_PROVIDER_SITE_OTHER): Payer: Medicare Other | Admitting: Family Medicine

## 2013-02-20 DIAGNOSIS — Z7901 Long term (current) use of anticoagulants: Secondary | ICD-10-CM

## 2013-02-20 DIAGNOSIS — Z5181 Encounter for therapeutic drug level monitoring: Secondary | ICD-10-CM

## 2013-02-20 DIAGNOSIS — I4891 Unspecified atrial fibrillation: Secondary | ICD-10-CM

## 2013-02-20 LAB — POCT INR: INR: 2

## 2013-03-12 ENCOUNTER — Other Ambulatory Visit: Payer: Self-pay | Admitting: Family Medicine

## 2013-03-20 ENCOUNTER — Ambulatory Visit (INDEPENDENT_AMBULATORY_CARE_PROVIDER_SITE_OTHER): Payer: Medicare Other | Admitting: Family Medicine

## 2013-03-20 DIAGNOSIS — I4891 Unspecified atrial fibrillation: Secondary | ICD-10-CM

## 2013-03-20 DIAGNOSIS — Z7901 Long term (current) use of anticoagulants: Secondary | ICD-10-CM

## 2013-03-20 DIAGNOSIS — Z5181 Encounter for therapeutic drug level monitoring: Secondary | ICD-10-CM

## 2013-03-20 LAB — POCT INR: INR: 2.2

## 2013-05-01 ENCOUNTER — Ambulatory Visit (INDEPENDENT_AMBULATORY_CARE_PROVIDER_SITE_OTHER): Payer: Medicare Other | Admitting: Family Medicine

## 2013-05-01 ENCOUNTER — Other Ambulatory Visit: Payer: Self-pay | Admitting: Family Medicine

## 2013-05-01 DIAGNOSIS — Z7901 Long term (current) use of anticoagulants: Secondary | ICD-10-CM

## 2013-05-01 DIAGNOSIS — I4891 Unspecified atrial fibrillation: Secondary | ICD-10-CM

## 2013-05-01 DIAGNOSIS — Z5181 Encounter for therapeutic drug level monitoring: Secondary | ICD-10-CM

## 2013-05-01 LAB — POCT INR: INR: 2.9

## 2013-05-01 NOTE — Telephone Encounter (Signed)
Sent!

## 2013-05-01 NOTE — Telephone Encounter (Signed)
Received refill request electronically. Last office visit 02/06/13 Is it okay to refill medication? 

## 2013-05-04 ENCOUNTER — Telehealth: Payer: Self-pay | Admitting: *Deleted

## 2013-05-04 NOTE — Telephone Encounter (Signed)
Per pharmacy fax, pt's insurance does not cover Ventolin, but does cover [employer]. Is it ok to switch to ProAir?

## 2013-05-04 NOTE — Telephone Encounter (Signed)
Yes, same sig, please make the change in EMR and at pharmacy.  Thanks.

## 2013-05-05 ENCOUNTER — Other Ambulatory Visit: Payer: Self-pay | Admitting: *Deleted

## 2013-05-05 MED ORDER — ALBUTEROL SULFATE HFA 108 (90 BASE) MCG/ACT IN AERS: 2.0000 | INHALATION_SPRAY | Freq: Four times a day (QID) | RESPIRATORY_TRACT | Status: DC | PRN

## 2013-05-05 NOTE — Telephone Encounter (Signed)
Changed in EMR and sent to pharmacy.

## 2013-05-31 ENCOUNTER — Ambulatory Visit (INDEPENDENT_AMBULATORY_CARE_PROVIDER_SITE_OTHER): Payer: Medicare Other | Admitting: Cardiovascular Disease

## 2013-05-31 ENCOUNTER — Ambulatory Visit (INDEPENDENT_AMBULATORY_CARE_PROVIDER_SITE_OTHER): Payer: Medicare Other

## 2013-05-31 ENCOUNTER — Encounter: Payer: Self-pay | Admitting: Cardiovascular Disease

## 2013-05-31 VITALS — BP 78/48 | HR 51 | Ht 69.0 in | Wt 208.0 lb

## 2013-05-31 DIAGNOSIS — I5022 Chronic systolic (congestive) heart failure: Secondary | ICD-10-CM

## 2013-05-31 DIAGNOSIS — I509 Heart failure, unspecified: Secondary | ICD-10-CM

## 2013-05-31 DIAGNOSIS — I4891 Unspecified atrial fibrillation: Secondary | ICD-10-CM

## 2013-05-31 DIAGNOSIS — E785 Hyperlipidemia, unspecified: Secondary | ICD-10-CM

## 2013-05-31 DIAGNOSIS — Z23 Encounter for immunization: Secondary | ICD-10-CM

## 2013-05-31 DIAGNOSIS — R42 Dizziness and giddiness: Secondary | ICD-10-CM

## 2013-05-31 DIAGNOSIS — R55 Syncope and collapse: Secondary | ICD-10-CM

## 2013-05-31 DIAGNOSIS — I951 Orthostatic hypotension: Secondary | ICD-10-CM | POA: Insufficient documentation

## 2013-05-31 NOTE — Assessment & Plan Note (Signed)
In the past we have felt cardiopathy was from alcohol intake. Never had stress test in the past several years even after low ejection fraction noted on echocardiogram. He had refused. He is willing to consider this today and we will schedule him for a pharmacologic Myoview next week.

## 2013-05-31 NOTE — Assessment & Plan Note (Signed)
Heart rate is reasonably well controlled. We'll continue coronary 12.5 mg twice a day. If blood pressure continues to run low, we'll need to change his medications, possibly decrease the Coreg down to 6.25 mg daily with digoxin for rate control.

## 2013-05-31 NOTE — Assessment & Plan Note (Signed)
I suspect the dramatic drops in his blood pressure today could be from taking Lasix 3 times over the past several days. He denies any edema or shortness of breath. We have suggested he hold the Lasix. Also suggested he hold lisinopril until blood pressure improves and orthostasis has resolved.

## 2013-05-31 NOTE — Patient Instructions (Addendum)
You are doing well. Hold the lisinopril for now Hold the lasix for now  We will schedule a stress test for low heart function  Monitor your blood pressure at home Call the office if it runs low  Please call us if you have new issues that need to be addressed before your next appt.  Your physician wants you to follow-up in: 3 months.  You will receive a reminder letter in the mail two months in advance. If you don't receive a letter, please call our office to schedule the follow-up appointment.   [facility] MYOVIEW  Your caregiver has ordered a Stress Test with nuclear imaging. The purpose of this test is to evaluate the blood supply to your heart muscle. This procedure is referred to as a "Non-Invasive Stress Test." This is because other than having an IV started in your vein, nothing is inserted or "invades" your body. Cardiac stress tests are done to find areas of poor blood flow to the heart by determining the extent of coronary artery disease (CAD). Some patients exercise on a treadmill, which naturally increases the blood flow to your heart, while others who are  unable to walk on a treadmill due to physical limitations have a pharmacologic/chemical stress agent called Lexiscan . This medicine will mimic walking on a treadmill by temporarily increasing your coronary blood flow.   Please note: these test may take anywhere between 2-4 hours to complete  PLEASE REPORT TO [facility] MEDICAL MALL ENTRANCE  THE VOLUNTEERS AT THE FIRST DESK WILL DIRECT YOU WHERE TO GO  Date of Procedure:_________WEDNESDAY, SEPT 17_______ Arrival Time for Procedure:_________7:00_____________  Instructions regarding medication:    __X__:  Hold LISINOPRIL night before procedure and morning of procedure    PLEASE NOTIFY THE OFFICE AT LEAST 24 HOURS IN ADVANCE IF YOU ARE UNABLE TO KEEP YOUR APPOINTMENT.  [phone] AND  PLEASE NOTIFY NUCLEAR MEDICINE AT [facility] AT LEAST 24 HOURS IN ADVANCE IF YOU ARE UNABLE TO KEEP  YOUR APPOINTMENT. [phone]  How to prepare for your Myoview test:  1. Do not eat or drink after midnight 2. No caffeine for 24 hours prior to test 3. No smoking 24 hours prior to test. 4. Your medication may be taken with water.  If your doctor stopped a medication because of this test, do not take that medication. 5. Ladies, please do not wear dresses.  Skirts or pants are appropriate. Please wear a short sleeve shirt. 6. No perfume, cologne or lotion. 7. Wear comfortable walking shoes. No heels!

## 2013-05-31 NOTE — Assessment & Plan Note (Signed)
Cholesterol is at goal on the current lipid regimen. No changes to the medications were made.  

## 2013-05-31 NOTE — Progress Notes (Signed)
Patient ID: Benjamin Soto, male    DOB: [birth year], 77 y.o.   MRN: [id number]  HPI Comments: Benjamin Soto is a pleasant 77 year old gentleman with a history of chronic atrial fibrillation, long smoking hx who continues to smoke, heavy ETOH hx, remote history of SVT, with severe arthritis in his knees,  on warfarin ,  with a history of diabetes, hyperlipidemia, who presents for routine followup,  He continues to smoke, drink wine on a regular basis, sometimes in heavy amounts. He is taking his statin and cholesterol is well controlled.  His wife reports that he has had recent falls. He reports having mechanical falls over the summer onto the concrete. His "legs got twisted up around something while he was lifting something heavy" uncertain if he was drinking at the time .  He does report having some dizzy spells. He has been taking Lasix 2 times over the weekend, once yesterday. Did not know why he was taking this. Felt he was close to take it. Denied any lower extremity edema, no shortness of breath . Wife also denies that he has had leg edema . He has noticed dizziness more this week, worse with standing . Blood pressures at home have been 90 systolic .  In the past he has refused workup for ischemia. He is willing to consider this today . Previous echocardiogram shows severely depressed ejection fraction estimated at 25-30%, moderate MR, moderate pulmonary hypertension. CT Scan of his abdomen several months ago showed coronary calcifications.   He does report a remote history of low testosterone and was previously on testosterone supplemental cream. He is uncertain when this was discontinued.  Labs show total cholesterol less than 100, LDL in the 40 range EKG shows atrial fibrillation with rate 105, right bundle branch block, left anterior fascicular block  EKG shows atrial fibrillation with ventricular rate 79 beats per minute, right bundle branch block, nonspecific ST abnormality, left  anterior fascicular block  Orthostatics are positive today. Lying down blood pressure 108/67, sitting is 91/59, standing 78/48. Heart rates in the 80s to 90s.   Outpatient Encounter Prescriptions as of 05/31/2013  Medication Sig Dispense Refill  . albuterol (PROVENTIL HFA;VENTOLIN HFA) 108 (90 BASE) MCG/ACT inhaler Inhale 2 puffs into the lungs every 6 (six) hours as needed for wheezing.  1 Inhaler  0  . allopurinol (ZYLOPRIM) 100 MG tablet Take 300 mg by mouth daily.      Marland Kitchen allopurinol (ZYLOPRIM) 300 MG tablet Take 1 tablet (300 mg total) by mouth daily.  90 tablet  3  . carvedilol (COREG) 12.5 MG tablet Take 1 tablet (12.5 mg total) by mouth 2 (two) times daily.  180 tablet  3  . cycloSPORINE (RESTASIS) 0.05 % ophthalmic emulsion Place 1 drop into both eyes 2 (two) times daily.      . furosemide (LASIX) 40 MG tablet Take 1 tablet (40 mg total) by mouth daily as needed.  90 tablet  3  . glucose blood (ONE TOUCH TEST STRIPS) test strip Use once daily       . lisinopril (ZESTRIL) 2.5 MG tablet Take 1 tablet (2.5 mg total) by mouth daily.  90 tablet  3  . metFORMIN (GLUCOPHAGE) 500 MG tablet Take 1 tablet (500 mg total) by mouth 2 (two) times daily with a meal.  180 tablet  3  . metoprolol (LOPRESSOR) 50 MG tablet Take 50 mg by mouth daily.      . NON FORMULARY Energy Greens Daily       .  simvastatin (ZOCOR) 20 MG tablet Take 1 tablet (20 mg total) by mouth at bedtime.  90 tablet  3  . warfarin (COUMADIN) 5 MG tablet Take 1 tablet (5 mg total) by mouth daily. And on Friday pt takes 2.5 mg or as directed  100 tablet  5    Review of Systems  Constitutional: Negative.   HENT: Negative.   Eyes: Negative.   Respiratory: Negative.   Cardiovascular: Negative.   Gastrointestinal: Negative.   Musculoskeletal: Negative.   Skin: Negative.   Neurological: Positive for dizziness.       Recent falls, possibly mechanical  Psychiatric/Behavioral: Negative.   All other systems reviewed and are  negative.   BP 78/48  Pulse 51  Ht 5\' 9"  (1.753 m)  Wt 208 lb (94.348 kg)  BMI 30.7 kg/m2 Orthostatics as above  Physical Exam  Nursing note and vitals reviewed. Constitutional: He is oriented to person, place, and time. He appears well-developed and well-nourished.  HENT:  Head: Normocephalic.  Nose: Nose normal.  Mouth/Throat: Oropharynx is clear and moist.  Eyes: Conjunctivae are normal. Pupils are equal, round, and reactive to light.  Neck: Normal range of motion. Neck supple. No JVD present.  Cardiovascular: Normal rate, S1 normal, S2 normal, normal heart sounds and intact distal pulses.  An irregularly irregular rhythm present. Exam reveals no gallop and no friction rub.   No murmur heard. Pulmonary/Chest: Effort normal and breath sounds normal. No respiratory distress. He has no wheezes. He has no rales. He exhibits no tenderness.  Abdominal: Soft. Bowel sounds are normal. He exhibits no distension. There is no tenderness.  Musculoskeletal: Normal range of motion. He exhibits no edema and no tenderness.  Lymphadenopathy:    He has no cervical adenopathy.  Neurological: He is alert and oriented to person, place, and time. Coordination normal.  Skin: Skin is warm and dry. No rash noted. No erythema.  Psychiatric: He has a normal mood and affect. His behavior is normal. Judgment and thought content normal.      Assessment and Plan

## 2013-06-02 ENCOUNTER — Telehealth: Payer: Self-pay

## 2013-06-02 NOTE — Telephone Encounter (Signed)
Spoke w/ pt's wife and instructed her that pt is to continue his coumadin before and after the stress test.

## 2013-06-02 NOTE — Telephone Encounter (Signed)
Pt wife has question about pt coumadin and his stress test on Wednedsay. Please call after 1pm.

## 2013-06-07 ENCOUNTER — Ambulatory Visit: Payer: Self-pay | Admitting: Cardiovascular Disease

## 2013-06-07 DIAGNOSIS — R55 Syncope and collapse: Secondary | ICD-10-CM

## 2013-06-09 ENCOUNTER — Telehealth: Payer: Self-pay

## 2013-06-09 ENCOUNTER — Encounter: Payer: Self-pay | Admitting: Cardiovascular Disease

## 2013-06-09 ENCOUNTER — Ambulatory Visit (INDEPENDENT_AMBULATORY_CARE_PROVIDER_SITE_OTHER): Payer: Medicare Other | Admitting: Cardiovascular Disease

## 2013-06-09 VITALS — BP 114/74 | HR 64 | Ht 69.0 in | Wt 199.0 lb

## 2013-06-09 DIAGNOSIS — I10 Essential (primary) hypertension: Secondary | ICD-10-CM

## 2013-06-09 DIAGNOSIS — E785 Hyperlipidemia, unspecified: Secondary | ICD-10-CM

## 2013-06-09 DIAGNOSIS — I2510 Atherosclerotic heart disease of native coronary artery without angina pectoris: Secondary | ICD-10-CM

## 2013-06-09 DIAGNOSIS — I4891 Unspecified atrial fibrillation: Secondary | ICD-10-CM

## 2013-06-09 DIAGNOSIS — E119 Type 2 diabetes mellitus without complications: Secondary | ICD-10-CM

## 2013-06-09 NOTE — Progress Notes (Signed)
Patient ID: Benjamin Soto, male    DOB: 10-Sep-1936, 77 y.o.   MRN: [id number]  HPI Comments: Benjamin Soto is a pleasant 77 year old gentleman with a history of chronic atrial fibrillation, long smoking hx who continues to smoke, heavy ETOH hx, remote history of SVT, with severe arthritis in his knees,  on warfarin ,  with a history of diabetes, hyperlipidemia, who presents for routine followup,  He continues to smoke, drink wine on a regular basis, sometimes in heavy amounts. He is taking his statin and cholesterol is well controlled.  recent falls. He reports having mechanical falls over the summer onto the concrete.  uncertain if he was drinking at the time .  On his last clinic visit, he was having dizzy spells. He was taking Lasix periodically because he thought he was supposed to. Denied having any edema or shortness of breath or dizziness was worse with standing. Blood pressure at home in the 90s systolic. He has stopped the Lasix. Dizziness has improved.  he had refused workup for ischemia in the past. On his last clinic visit, we have recommended stress testing given his dizziness, low ejection fraction on previous echocardiogram.  He presents today to discuss his stress test results. This shows ischemia in the mid to distal anteroseptal wall and apical region. Images were shown to him on the computer. He reports that he is relatively asymptomatic but his wife reports that he has shortness of breath, dizziness, fatigue. He does not do much and is sedentary most of the day.  Previous echocardiogram shows severely depressed ejection fraction estimated at 25-30%, moderate MR, moderate pulmonary hypertension. CT Scan of his abdomen  showed coronary calcifications.   He does report having some mild hematuria on warfarin. He does report a remote history of low testosterone and was previously on testosterone supplemental cream. He is uncertain when this was discontinued. Labs show total cholesterol  less than 100, LDL in the 40 range EKG shows atrial fibrillation with rate 105, right bundle branch block, left anterior fascicular block  EKG shows atrial fibrillation with ventricular rate 79 beats per minute, right bundle branch block, nonspecific ST abnormality, left anterior fascicular block   Outpatient Encounter Prescriptions as of 06/09/2013  Medication Sig Dispense Refill  . albuterol (PROVENTIL HFA;VENTOLIN HFA) 108 (90 BASE) MCG/ACT inhaler Inhale 2 puffs into the lungs every 6 (six) hours as needed for wheezing.  1 Inhaler  0  . allopurinol (ZYLOPRIM) 100 MG tablet Take 300 mg by mouth daily.      Marland Kitchen allopurinol (ZYLOPRIM) 300 MG tablet Take 1 tablet (300 mg total) by mouth daily.  90 tablet  3  . carvedilol (COREG) 12.5 MG tablet Take 1 tablet (12.5 mg total) by mouth 2 (two) times daily.  180 tablet  3  . cycloSPORINE (RESTASIS) 0.05 % ophthalmic emulsion Place 1 drop into both eyes 2 (two) times daily.      Marland Kitchen glucose blood (ONE TOUCH TEST STRIPS) test strip Use once daily       . metFORMIN (GLUCOPHAGE) 500 MG tablet Take 1 tablet (500 mg total) by mouth 2 (two) times daily with a meal.  180 tablet  3  . NON FORMULARY Energy Greens Daily       . simvastatin (ZOCOR) 20 MG tablet Take 1 tablet (20 mg total) by mouth at bedtime.  90 tablet  3  . warfarin (COUMADIN) 5 MG tablet Take 1 tablet (5 mg total) by mouth daily. And on Friday pt takes  2.5 mg or as directed  100 tablet  5  . furosemide (LASIX) 40 MG tablet Take 1 tablet (40 mg total) by mouth daily as needed.  90 tablet  3    Review of Systems  Constitutional: Negative.   HENT: Negative.   Eyes: Negative.   Respiratory: Negative.   Cardiovascular: Negative.   Gastrointestinal: Negative.   Endocrine: Negative.   Musculoskeletal: Negative.   Skin: Negative.   Allergic/Immunologic: Negative.   Neurological:       Recent falls, possibly mechanical  Hematological: Negative.   Psychiatric/Behavioral: Negative.   All other  systems reviewed and are negative.   BP 114/74  Pulse 64  Ht 5\' 9"  (1.753 m)  Wt 199 lb (90.266 kg)  BMI 29.37 kg/m2  Physical Exam  Nursing note and vitals reviewed. Constitutional: He is oriented to person, place, and time. He appears well-developed and well-nourished.  HENT:  Head: Normocephalic.  Nose: Nose normal.  Mouth/Throat: Oropharynx is clear and moist.  Eyes: Conjunctivae are normal. Pupils are equal, round, and reactive to light.  Neck: Normal range of motion. Neck supple. No JVD present.  Cardiovascular: Normal rate, S1 normal, S2 normal, normal heart sounds and intact distal pulses.  An irregularly irregular rhythm present. Exam reveals no gallop and no friction rub.   No murmur heard. Pulmonary/Chest: Effort normal and breath sounds normal. No respiratory distress. He has no wheezes. He has no rales. He exhibits no tenderness.  Abdominal: Soft. Bowel sounds are normal. He exhibits no distension. There is no tenderness.  Musculoskeletal: Normal range of motion. He exhibits no edema and no tenderness.  Lymphadenopathy:    He has no cervical adenopathy.  Neurological: He is alert and oriented to person, place, and time. Coordination normal.  Skin: Skin is warm and dry. No rash noted. No erythema.  Psychiatric: He has a normal mood and affect. His behavior is normal. Judgment and thought content normal.      Assessment and Plan

## 2013-06-09 NOTE — Assessment & Plan Note (Signed)
Blood pressure is well controlled on today's visit. No changes made to the medications. Encouraged him to hold his Lasix and take only for edema given previous orthostatic hypotension.

## 2013-06-09 NOTE — Telephone Encounter (Signed)
Pt seen in office today to discuss results and possible cath with Dr. Gollan. 

## 2013-06-09 NOTE — Assessment & Plan Note (Signed)
Currently on warfarin. Rate well controlled

## 2013-06-09 NOTE — Patient Instructions (Addendum)
Please call the office for any worsening of fatigue, chest tightness, shortness of breath Call if you would like a cardiac cath  Please call us if you have new issues that need to be addressed before your next appt.  Your physician wants you to follow-up in: 3 months.  You will receive a reminder letter in the mail two months in advance. If you don't receive a letter, please call our office to schedule the follow-up appointment.

## 2013-06-09 NOTE — Assessment & Plan Note (Signed)
Recent stress test suggest anteroseptal ischemia mid to distal anterior wall, apical region. Suggested he consider cardiac catheterization. This was discussed with his wife. They will cause back when they are ready for the procedure.

## 2013-06-09 NOTE — Assessment & Plan Note (Signed)
Cholesterol is at goal on the current lipid regimen. No changes to the medications were made.  

## 2013-06-09 NOTE — Telephone Encounter (Signed)
Message copied by Marilynne Halsted on Fri Jun 09, 2013 11:41 AM ------      Message from: Benjamin Soto      Created: Thu Jun 08, 2013 10:44 PM       Needs follow up appt to discuss results,      positive stress test ------

## 2013-06-09 NOTE — Assessment & Plan Note (Signed)
We have encouraged continued exercise, careful diet management in an effort to lose weight. 

## 2013-06-12 ENCOUNTER — Ambulatory Visit: Payer: Self-pay | Admitting: Cardiovascular Disease

## 2013-06-12 ENCOUNTER — Telehealth: Payer: Self-pay

## 2013-06-12 ENCOUNTER — Ambulatory Visit: Payer: Medicare Other

## 2013-06-12 ENCOUNTER — Other Ambulatory Visit: Payer: Medicare Other

## 2013-06-12 ENCOUNTER — Ambulatory Visit (INDEPENDENT_AMBULATORY_CARE_PROVIDER_SITE_OTHER): Payer: Medicare Other | Admitting: Family Medicine

## 2013-06-12 ENCOUNTER — Other Ambulatory Visit: Payer: Self-pay

## 2013-06-12 DIAGNOSIS — I4891 Unspecified atrial fibrillation: Secondary | ICD-10-CM

## 2013-06-12 DIAGNOSIS — R0602 Shortness of breath: Secondary | ICD-10-CM

## 2013-06-12 DIAGNOSIS — R5383 Other fatigue: Secondary | ICD-10-CM

## 2013-06-12 DIAGNOSIS — R0789 Other chest pain: Secondary | ICD-10-CM

## 2013-06-12 DIAGNOSIS — R9439 Abnormal result of other cardiovascular function study: Secondary | ICD-10-CM

## 2013-06-12 DIAGNOSIS — Z7901 Long term (current) use of anticoagulants: Secondary | ICD-10-CM

## 2013-06-12 DIAGNOSIS — Z5181 Encounter for therapeutic drug level monitoring: Secondary | ICD-10-CM

## 2013-06-12 LAB — POCT INR: INR: 2.5

## 2013-06-12 NOTE — Telephone Encounter (Signed)
Spoke w/ pt & his wife. Cardiac cath sched for 06/22/13 @ 9:30 at [facility]. Pt to come in this afternoon for preprocedure labs and chest x-ray. Letter printed to go over instructions for cath.

## 2013-06-13 ENCOUNTER — Telehealth: Payer: Self-pay

## 2013-06-13 LAB — CBC WITH DIFFERENTIAL/PLATELET
Basophils Absolute: 0 10*3/uL (ref 0.0–0.2)
Basos: 0 %
Eos: 2 %
Eosinophils Absolute: 0.1 10*3/uL (ref 0.0–0.4)
HCT: 42.3 % (ref 37.5–51.0)
Hemoglobin: 13.8 g/dL (ref 12.6–17.7)
Immature Grans (Abs): 0 10*3/uL (ref 0.0–0.1)
Immature Granulocytes: 0 %
Lymphocytes Absolute: 1.4 10*3/uL (ref 0.7–3.1)
Lymphs: 30 %
MCH: 31.3 pg (ref 26.6–33.0)
MCHC: 32.6 g/dL (ref 31.5–35.7)
MCV: 96 fL (ref 79–97)
Monocytes Absolute: 0.3 10*3/uL (ref 0.1–0.9)
Monocytes: 6 %
Neutrophils Absolute: 3 10*3/uL (ref 1.4–7.0)
Neutrophils Relative %: 62 %
RBC: 4.41 x10E6/uL (ref 4.14–5.80)
RDW: 15.1 % (ref 12.3–15.4)
WBC: 4.9 10*3/uL (ref 3.4–10.8)

## 2013-06-13 LAB — BASIC METABOLIC PANEL
BUN/Creatinine Ratio: 16 (ref 10–22)
BUN: 20 mg/dL (ref 8–27)
CO2: 22 mmol/L (ref 18–29)
Calcium: 9 mg/dL (ref 8.6–10.2)
Chloride: 103 mmol/L (ref 97–108)
Creatinine, Ser: 1.23 mg/dL (ref 0.76–1.27)
GFR calc Af Amer: 66 mL/min/{1.73_m2} (ref >59)
GFR calc non Af Amer: 57 mL/min/{1.73_m2} — ABNORMAL LOW (ref >59)
Glucose: 103 mg/dL — ABNORMAL HIGH (ref 65–99)
Potassium: 5 mmol/L (ref 3.5–5.2)
Sodium: 142 mmol/L (ref 134–144)

## 2013-06-13 LAB — PROTIME-INR
INR: 2.8 — ABNORMAL HIGH (ref 0.8–1.2)
Prothrombin Time: 28.9 s — ABNORMAL HIGH (ref 9.1–12.0)

## 2013-06-13 NOTE — Telephone Encounter (Signed)
I would agree, changed to the new medication after the cardiac catheterization

## 2013-06-13 NOTE — Telephone Encounter (Signed)
Spoke w/ pt.  Instructed him to stop his coumadin on Saturday, 9/26, which is 5 days before his cath.  Explained the risk of stroke while off of coumadin.  Offered to bridge with lovenox during this time, but pt declined. Pt is interested in trying xarelto, or "something that doesn't require visits every 6 weeks at $10 a trip". Pt was eager to switch to new blood thinner as soon as possible, but informed him that it would be beneficial to make the transition during the time that he is off of his coumadin for cath and not before.

## 2013-06-21 HISTORY — PX: CARDIAC CATHETERIZATION: SHX172

## 2013-06-22 ENCOUNTER — Encounter: Payer: Self-pay | Admitting: Cardiovascular Disease

## 2013-06-22 ENCOUNTER — Ambulatory Visit: Payer: Self-pay | Admitting: Cardiovascular Disease

## 2013-06-22 DIAGNOSIS — I2510 Atherosclerotic heart disease of native coronary artery without angina pectoris: Secondary | ICD-10-CM

## 2013-06-22 LAB — PROTIME-INR
INR: 1
Prothrombin Time: 13.2 secs (ref 11.5–14.7)

## 2013-06-22 LAB — POCT INR: INR: 1

## 2013-06-23 ENCOUNTER — Other Ambulatory Visit (INDEPENDENT_AMBULATORY_CARE_PROVIDER_SITE_OTHER): Payer: Medicare Other

## 2013-06-23 DIAGNOSIS — I4891 Unspecified atrial fibrillation: Secondary | ICD-10-CM

## 2013-06-23 DIAGNOSIS — Z7901 Long term (current) use of anticoagulants: Secondary | ICD-10-CM

## 2013-06-23 DIAGNOSIS — Z5181 Encounter for therapeutic drug level monitoring: Secondary | ICD-10-CM

## 2013-06-30 ENCOUNTER — Encounter: Payer: Self-pay | Admitting: Cardiovascular Disease

## 2013-06-30 ENCOUNTER — Ambulatory Visit (INDEPENDENT_AMBULATORY_CARE_PROVIDER_SITE_OTHER): Payer: Medicare Other | Admitting: Cardiovascular Disease

## 2013-06-30 VITALS — BP 98/68 | HR 78 | Ht 69.0 in | Wt 201.5 lb

## 2013-06-30 DIAGNOSIS — I2510 Atherosclerotic heart disease of native coronary artery without angina pectoris: Secondary | ICD-10-CM

## 2013-06-30 DIAGNOSIS — I5022 Chronic systolic (congestive) heart failure: Secondary | ICD-10-CM

## 2013-06-30 DIAGNOSIS — I428 Other cardiomyopathies: Secondary | ICD-10-CM

## 2013-06-30 DIAGNOSIS — E785 Hyperlipidemia, unspecified: Secondary | ICD-10-CM

## 2013-06-30 DIAGNOSIS — I420 Dilated cardiomyopathy: Secondary | ICD-10-CM

## 2013-06-30 DIAGNOSIS — I509 Heart failure, unspecified: Secondary | ICD-10-CM

## 2013-06-30 DIAGNOSIS — I4891 Unspecified atrial fibrillation: Secondary | ICD-10-CM

## 2013-06-30 NOTE — Patient Instructions (Signed)
You are doing well. No medication changes were made.  Please call the office if you have episodes of lightheadedness when standing  Please call us if you have new issues that need to be addressed before your next appt.  Your physician wants you to follow-up in: 12 months.  You will receive a reminder letter in the mail two months in advance. If you don't receive a letter, please call our office to schedule the follow-up appointment.

## 2013-06-30 NOTE — Assessment & Plan Note (Signed)
Mild to moderate 3 vessel CAD on recent cardiac catheterization. Recommended he stay on his cholesterol medication. Cholesterol is at goal. Diabetes well controlled. Smoking is his main risk factor now and smoking cessation was recommended as well as decreasing his alcohol intake.

## 2013-06-30 NOTE — Assessment & Plan Note (Signed)
Cholesterol is at goal on the current lipid regimen. No changes to the medications were made.  

## 2013-06-30 NOTE — Assessment & Plan Note (Signed)
Chronic anticoagulation for his permanent atrial fibrillation. Rate relatively well-controlled on beta blockers

## 2013-06-30 NOTE — Assessment & Plan Note (Signed)
Depressed ejection fraction on echocardiogram and confirmed on cardiac catheterization. Recommended that he decrease his alcohol intake. Unable to tolerate ACE inhibitors given hypotension and dizziness. We'll continue beta blockers. He takes Lasix on a when necessary.

## 2013-06-30 NOTE — Progress Notes (Signed)
Patient ID: Benjamin Soto, male    DOB: 1936/05/17, 77 y.o.   MRN: [id number]  HPI Comments: Benjamin Soto is a pleasant 77 year old gentleman with a history of chronic atrial fibrillation, long smoking hx who continues to smoke, heavy ETOH hx, remote history of SVT, with severe arthritis in his knees,  on warfarin ,  with a history of diabetes, hyperlipidemia, who presents for routine followup,  He continues to smoke, drink wine on a regular basis, sometimes in heavy amounts.  cholesterol is well controlled.  recent falls. He reports having mechanical falls over the summer onto the concrete.  uncertain if he was drinking at the time .  On his last clinic visit, he was having dizzy spells. He was taking Lasix periodically because he thought he was supposed to. Denied having any edema or shortness of breath or dizziness was worse with standing. Blood pressure at home in the 90s systolic. He has stopped the Lasix. Dizziness has improved.  Stress test was ordered which showed ischemia in the mid to distal anteroseptal wall and apical region. Cardiac catheterization was performed last week that showed mild to moderate three-vessel disease, depressed ejection fraction estimated at 30-35% This was discussed with he and his wife. Recommendation was made to decrease his alcohol intake. Lisinopril was held previously secondary to low blood pressures and dizziness.  Previous echocardiogram shows severely depressed ejection fraction estimated at 25-30%, moderate MR, moderate pulmonary hypertension. CT Scan of his abdomen  showed coronary calcifications.   Recent chest x-ray showed calcified granuloma in the right lower lobe which was not seen in 2012  He does report a remote history of low testosterone and was previously on testosterone supplemental cream. He is uncertain when this was discontinued. Labs show total cholesterol less than 100, LDL in the 40 range   EKG shows atrial fibrillation with  ventricular rate 78 beats per minute, right bundle branch block, nonspecific ST abnormality, left anterior fascicular block   Outpatient Encounter Prescriptions as of 06/30/2013  Medication Sig Dispense Refill  . albuterol (PROVENTIL HFA;VENTOLIN HFA) 108 (90 BASE) MCG/ACT inhaler Inhale 2 puffs into the lungs every 6 (six) hours as needed for wheezing.  1 Inhaler  0  . allopurinol (ZYLOPRIM) 100 MG tablet Take 300 mg by mouth daily.      Marland Kitchen allopurinol (ZYLOPRIM) 300 MG tablet Take 1 tablet (300 mg total) by mouth daily.  90 tablet  3  . carvedilol (COREG) 12.5 MG tablet Take 1 tablet (12.5 mg total) by mouth 2 (two) times daily.  180 tablet  3  . cycloSPORINE (RESTASIS) 0.05 % ophthalmic emulsion Place 1 drop into both eyes 2 (two) times daily.      . furosemide (LASIX) 40 MG tablet Take 1 tablet (40 mg total) by mouth daily as needed.  90 tablet  3  . glucose blood (ONE TOUCH TEST STRIPS) test strip Use once daily       . ipratropium (ATROVENT) 0.06 % nasal spray Place 1 spray into the nose as needed.       . metFORMIN (GLUCOPHAGE) 500 MG tablet Take 1 tablet (500 mg total) by mouth 2 (two) times daily with a meal.  180 tablet  3  . NON FORMULARY Energy Greens Daily       . simvastatin (ZOCOR) 20 MG tablet Take 1 tablet (20 mg total) by mouth at bedtime.  90 tablet  3  . warfarin (COUMADIN) 5 MG tablet Take 1 tablet (5 mg total) by  mouth daily. And on Friday pt takes 2.5 mg or as directed  100 tablet  5   No facility-administered encounter medications on file as of 06/30/2013.     Review of Systems  Constitutional: Negative.   HENT: Negative.   Eyes: Negative.   Respiratory: Negative.   Cardiovascular: Negative.   Gastrointestinal: Negative.   Endocrine: Negative.   Musculoskeletal: Negative.   Skin: Negative.   Allergic/Immunologic: Negative.   Neurological: Negative.        Recent falls, possibly mechanical  Hematological: Negative.   Psychiatric/Behavioral: Negative.   All  other systems reviewed and are negative.   BP 98/68  Pulse 78  Ht 5\' 9"  (1.753 m)  Wt 201 lb 8 oz (91.4 kg)  BMI 29.74 kg/m2  Physical Exam  Nursing note and vitals reviewed. Constitutional: He is oriented to person, place, and time. He appears well-developed and well-nourished.  HENT:  Head: Normocephalic.  Nose: Nose normal.  Mouth/Throat: Oropharynx is clear and moist.  Eyes: Conjunctivae are normal. Pupils are equal, round, and reactive to light.  Neck: Normal range of motion. Neck supple. No JVD present.  Cardiovascular: Normal rate, S1 normal, S2 normal, normal heart sounds and intact distal pulses.  An irregularly irregular rhythm present. Exam reveals no gallop and no friction rub.   No murmur heard. Pulmonary/Chest: Effort normal and breath sounds normal. No respiratory distress. He has no wheezes. He has no rales. He exhibits no tenderness.  Abdominal: Soft. Bowel sounds are normal. He exhibits no distension. There is no tenderness.  Musculoskeletal: Normal range of motion. He exhibits no edema and no tenderness.  Lymphadenopathy:    He has no cervical adenopathy.  Neurological: He is alert and oriented to person, place, and time. Coordination normal.  Skin: Skin is warm and dry. No rash noted. No erythema.  Psychiatric: He has a normal mood and affect. His behavior is normal. Judgment and thought content normal.      Assessment and Plan

## 2013-07-24 ENCOUNTER — Ambulatory Visit (INDEPENDENT_AMBULATORY_CARE_PROVIDER_SITE_OTHER): Payer: Medicare Other | Admitting: Family Medicine

## 2013-07-24 DIAGNOSIS — I4891 Unspecified atrial fibrillation: Secondary | ICD-10-CM

## 2013-07-24 DIAGNOSIS — Z7901 Long term (current) use of anticoagulants: Secondary | ICD-10-CM

## 2013-07-24 DIAGNOSIS — Z5181 Encounter for therapeutic drug level monitoring: Secondary | ICD-10-CM

## 2013-07-24 LAB — POCT INR: INR: 2.9

## 2013-08-06 ENCOUNTER — Other Ambulatory Visit: Payer: Self-pay | Admitting: Family Medicine

## 2013-08-07 HISTORY — PX: CYST EXCISION: SHX5701

## 2013-08-09 ENCOUNTER — Telehealth: Payer: Self-pay

## 2013-08-09 ENCOUNTER — Observation Stay: Payer: Self-pay | Admitting: Internal Medicine

## 2013-08-09 LAB — URINALYSIS, COMPLETE
Bacteria: NONE SEEN
Bilirubin,UR: NEGATIVE
Glucose,UR: NEGATIVE mg/dL (ref 0–75)
Leukocyte Esterase: NEGATIVE
Nitrite: NEGATIVE
Ph: 5 (ref 4.5–8.0)
Protein: >=500
RBC,UR: 9 /HPF (ref 0–5)
Specific Gravity: 1.018 (ref 1.003–1.030)
Squamous Epithelial: <1
WBC UR: 1 /HPF (ref 0–5)

## 2013-08-09 LAB — PROTIME-INR
INR: 2.4
Prothrombin Time: 25.7 secs — ABNORMAL HIGH (ref 11.5–14.7)

## 2013-08-09 LAB — COMPREHENSIVE METABOLIC PANEL
Albumin: 3.8 g/dL (ref 3.4–5.0)
Alkaline Phosphatase: 88 U/L (ref 50–136)
Anion Gap: 6 — ABNORMAL LOW (ref 7–16)
BUN: 17 mg/dL (ref 7–18)
Bilirubin,Total: 1.3 mg/dL — ABNORMAL HIGH (ref 0.2–1.0)
Calcium, Total: 8.9 mg/dL (ref 8.5–10.1)
Chloride: 107 mmol/L (ref 98–107)
Co2: 25 mmol/L (ref 21–32)
Creatinine: 1.1 mg/dL (ref 0.60–1.30)
EGFR (African American): >60
EGFR (Non-African Amer.): >60
Glucose: 95 mg/dL (ref 65–99)
Osmolality: 277 (ref 275–301)
Potassium: 4.9 mmol/L (ref 3.5–5.1)
SGOT(AST): 31 U/L (ref 15–37)
SGPT (ALT): 30 U/L (ref 12–78)
Sodium: 138 mmol/L (ref 136–145)
Total Protein: 6.5 g/dL (ref 6.4–8.2)

## 2013-08-09 LAB — CBC
HCT: 43.1 % (ref 40.0–52.0)
HGB: 14 g/dL (ref 13.0–18.0)
MCV: 99 fL (ref 80–100)
Platelet: 138 10*3/uL — ABNORMAL LOW (ref 150–440)
RBC: 4.37 10*6/uL — ABNORMAL LOW (ref 4.40–5.90)
WBC: 11.6 10*3/uL — ABNORMAL HIGH (ref 3.8–10.6)

## 2013-08-09 LAB — TROPONIN I: Troponin-I: 0.03 ng/mL

## 2013-08-09 NOTE — Telephone Encounter (Signed)
Thanks

## 2013-08-09 NOTE — Telephone Encounter (Signed)
pts wife said pt would not go to ED;I spoke with pt and he agreed to go to [facility] ED if I would call to let them know he was coming. Mrs Ripberger is calling 911 now. I spoke with Kathlene November in triage at [facility] and gave info from this note; pt will see ER doctor and be evaluated.

## 2013-08-09 NOTE — Telephone Encounter (Signed)
911, ER.  Thanks.

## 2013-08-09 NOTE — Telephone Encounter (Signed)
pts wife said had small mole on top of head removed on 08/07/13; Mrs Jasinski said this morning pt started acting strange; pt only responds with yes and no answers when Mrs Bellis talks with pt. Mrs Margraf said his eyes do not look right. No h/a, no dizziness,no CP and no dizziness.No weakness in arms, hands or legs. Pt is walking slower than usual. Pt is afraid pt has had stroke.

## 2013-08-10 DIAGNOSIS — I059 Rheumatic mitral valve disease, unspecified: Secondary | ICD-10-CM

## 2013-08-10 LAB — AMMONIA: Ammonia, Plasma: <25 mcmol/L (ref 11–32)

## 2013-08-10 NOTE — Telephone Encounter (Signed)
Pt's wife very upset; pt was admitted to [facility] on 08/09/13 and was discharged today. Mrs Mashek said "I have lost my husband in 2 days".  Mrs Monnier said pt was admitted on 08/09/13 and was discharged today; pt is no better; Mrs Brandenburg was told pt had AMS (?altered mental status), transcient global amnesia. Mrs Feagans was told pt did not have stroke. Pt is scheduled to see neurologist 08/18/13 and Dr Mariah Milling cardiologist on 08/24/13. Mrs Blaustein request Dr Para March call  [phone] and explain what is going on. [facility] records requested by fax.

## 2013-08-10 NOTE — Telephone Encounter (Signed)
Call wife.  D/w her about plan. I have reviewed available records from [facility].  Would be reasonable to get neuro eval next week and then cards eval after that.  Both are scheduled. She thanked me for the call.

## 2013-08-11 ENCOUNTER — Telehealth: Payer: Self-pay

## 2013-08-11 ENCOUNTER — Ambulatory Visit (INDEPENDENT_AMBULATORY_CARE_PROVIDER_SITE_OTHER): Payer: Medicare Other | Admitting: Family Medicine

## 2013-08-11 ENCOUNTER — Encounter: Payer: Self-pay | Admitting: Family Medicine

## 2013-08-11 VITALS — BP 122/62 | HR 82 | Temp 97.6°F | Wt 203.0 lb

## 2013-08-11 DIAGNOSIS — S0003XS Contusion of scalp, sequela: Secondary | ICD-10-CM

## 2013-08-11 DIAGNOSIS — R413 Other amnesia: Secondary | ICD-10-CM

## 2013-08-11 DIAGNOSIS — Z5181 Encounter for therapeutic drug level monitoring: Secondary | ICD-10-CM

## 2013-08-11 DIAGNOSIS — I4891 Unspecified atrial fibrillation: Secondary | ICD-10-CM

## 2013-08-11 DIAGNOSIS — IMO0002 Reserved for concepts with insufficient information to code with codable children: Secondary | ICD-10-CM

## 2013-08-11 DIAGNOSIS — Z7901 Long term (current) use of anticoagulants: Secondary | ICD-10-CM

## 2013-08-11 LAB — POCT INR: INR: 2.5

## 2013-08-11 MED ORDER — WARFARIN SODIUM 5 MG PO TABS: ORAL_TABLET | ORAL | Status: DC

## 2013-08-11 NOTE — Progress Notes (Signed)
Pre-visit discussion using our clinic review tool. No additional management support is needed unless otherwise documented below in the visit note.  He doesn't remember getting to the hospital but remembers procedures in the hospital.  He recalls the bed being uncomfortable and "setting off the alarms every time I moved in the bed."  His thoughts are improved in the meantime. Wife thinks his thoughts are improved today. I asked him how he feels today.  He said like "like shit," and then chuckled, and he/his wife/I take this to mean he is returning to baseline. His speech seems to be back to normal today.  No focal neuro changes, ie weakness, today.  Hospital records reviewed.   He came in for eval today for scalp swelling.  Prev with benign scalp lesion removed from the superior scalp. CT and MRI head both with scalp hematoma noted during admission. On coumadin. No other bleeding or bruising.   Meds, vitals, and allergies reviewed.   ROS: See HPI.  Otherwise, noncontributory.  Forehead swelling w/o bruising or erythema.  Not fluctuant.  Healing excision site on the top of the head EOMI rrr ctab No bruising other than small bruises noted on the dorsum of the hands, and this is not uncommon for patient.  Mentation wnl on testing in the clinic.  CN 2-12 wnl B, S/S/DTR wnl x4.

## 2013-08-11 NOTE — Telephone Encounter (Signed)
Benjamin Soto said there is no redness or bruise on forehead and no fever.No vision problems. Benjamin Makar will apply ice and call back if condition changes or worsens.She said pt may have bumped head while in hospital bed.

## 2013-08-11 NOTE — Patient Instructions (Addendum)
Take tylenol for the pain. Use an ice bag on your forehead and avoid bending over.  This should gradually get smaller.  If it continues to enlarge, then notify us.  Skip the coumadin today.  Take 2.5mg  Saturday and Sunday.  Come back for a recheck INR on Monday with Carlena Sax.   Take care.

## 2013-08-11 NOTE — Telephone Encounter (Signed)
pts wife said this morning pt seems to be feeling better; but noticed a large swelling above lt eye at forehead. Pt has h/a but no dizziness.Please advise.

## 2013-08-13 DIAGNOSIS — R413 Other amnesia: Secondary | ICD-10-CM | POA: Insufficient documentation

## 2013-08-13 DIAGNOSIS — S0003XA Contusion of scalp, initial encounter: Secondary | ICD-10-CM | POA: Insufficient documentation

## 2013-08-13 NOTE — Assessment & Plan Note (Signed)
Will dec coumadin for now and recheck on Monday.  He agrees.  No active bleeding.  This should resolve slowly.  D/w pt and wife.

## 2013-08-13 NOTE — Assessment & Plan Note (Signed)
Neg w/u, apparently improving/resolving. Has f/u with neuro pending.  D/w pt and wife about dx and need for neuro f/u. >25 min spent with face to face with patient, >50% counseling and/or coordinating care.

## 2013-08-14 ENCOUNTER — Ambulatory Visit (INDEPENDENT_AMBULATORY_CARE_PROVIDER_SITE_OTHER): Payer: Medicare Other | Admitting: Family Medicine

## 2013-08-14 DIAGNOSIS — Z5181 Encounter for therapeutic drug level monitoring: Secondary | ICD-10-CM

## 2013-08-14 DIAGNOSIS — Z7901 Long term (current) use of anticoagulants: Secondary | ICD-10-CM

## 2013-08-14 DIAGNOSIS — I4891 Unspecified atrial fibrillation: Secondary | ICD-10-CM

## 2013-08-14 LAB — POCT INR: INR: 2.6

## 2013-08-18 ENCOUNTER — Telehealth: Payer: Self-pay | Admitting: *Deleted

## 2013-08-18 ENCOUNTER — Ambulatory Visit: Payer: Medicare Other | Admitting: Family Medicine

## 2013-08-18 DIAGNOSIS — R413 Other amnesia: Secondary | ICD-10-CM

## 2013-08-18 NOTE — Telephone Encounter (Signed)
Wife advised.  They prefer [facility].

## 2013-08-18 NOTE — Telephone Encounter (Signed)
Pt was d/c from hosp last Thursday. They referred him to neuro with an appt at [facility] clinic today 08/18/13 @ 11:15. Wife just found out this am that [place] clinic is closed today. Wife says we have a copy of the hosp paperwork and she wants to know if you would be able to refer him to neuro asap, since pt no longer has an appt at [place].

## 2013-08-18 NOTE — Telephone Encounter (Signed)
I don't think we can get him in with neuro today. I would have her call the neuro clinic on Monday to see if they can work him in.  That may be the quickest way to get him seen.  Or we can refer to another clinic if needed- but that would likely take longer to get the appointment set.

## 2013-08-18 NOTE — Telephone Encounter (Signed)
Referral to neuro is in.  Thanks.

## 2013-08-18 NOTE — Telephone Encounter (Signed)
Ms. Benjamin Soto says that the original appt was supposed to have been made at Neuro at [facility] [place] but apparently a mistake was made at the hospital and the appt was scheduled at this office instead of [name].  Please advise.

## 2013-08-21 ENCOUNTER — Ambulatory Visit (INDEPENDENT_AMBULATORY_CARE_PROVIDER_SITE_OTHER): Payer: Medicare Other | Admitting: Family Medicine

## 2013-08-21 DIAGNOSIS — Z7901 Long term (current) use of anticoagulants: Secondary | ICD-10-CM

## 2013-08-21 DIAGNOSIS — I4891 Unspecified atrial fibrillation: Secondary | ICD-10-CM

## 2013-08-21 DIAGNOSIS — Z5181 Encounter for therapeutic drug level monitoring: Secondary | ICD-10-CM

## 2013-08-21 LAB — POCT INR: INR: 1.5

## 2013-08-21 NOTE — Progress Notes (Signed)
D/w pt.  Noted that facial swelling is resolved and scalp lesion is smaller, appears to be healing w/o signs of infection.  Not red or tender.

## 2013-08-24 ENCOUNTER — Encounter: Payer: Self-pay | Admitting: Cardiovascular Disease

## 2013-08-24 ENCOUNTER — Ambulatory Visit (INDEPENDENT_AMBULATORY_CARE_PROVIDER_SITE_OTHER): Payer: Medicare Other | Admitting: Cardiovascular Disease

## 2013-08-24 VITALS — BP 110/70 | HR 74 | Ht 69.0 in | Wt 202.5 lb

## 2013-08-24 DIAGNOSIS — I420 Dilated cardiomyopathy: Secondary | ICD-10-CM

## 2013-08-24 DIAGNOSIS — R4182 Altered mental status, unspecified: Secondary | ICD-10-CM

## 2013-08-24 DIAGNOSIS — I2510 Atherosclerotic heart disease of native coronary artery without angina pectoris: Secondary | ICD-10-CM

## 2013-08-24 DIAGNOSIS — E785 Hyperlipidemia, unspecified: Secondary | ICD-10-CM

## 2013-08-24 DIAGNOSIS — I509 Heart failure, unspecified: Secondary | ICD-10-CM

## 2013-08-24 DIAGNOSIS — I428 Other cardiomyopathies: Secondary | ICD-10-CM

## 2013-08-24 DIAGNOSIS — I5022 Chronic systolic (congestive) heart failure: Secondary | ICD-10-CM

## 2013-08-24 DIAGNOSIS — I4891 Unspecified atrial fibrillation: Secondary | ICD-10-CM

## 2013-08-24 NOTE — Progress Notes (Signed)
Patient ID: Benjamin Soto, male    DOB: 19-Dec-1935, 77 y.o.   MRN: [id number]  HPI Comments: Benjamin Soto is a pleasant 77 year old gentleman with a history of chronic atrial fibrillation, long smoking hx, heavy ETOH hx, remote history of SVT, with severe arthritis in his knees,  on warfarin ,  with a history of diabetes, hyperlipidemia, who presents for routine followup, History low testosterone in the past, is not on any supplement  He continues to drink wine on a regular basis, sometimes in heavy amounts. History of falls, mechanical fall over the summer 2014  on concrete  Recent episode of confusion, amnesia. Wife witnessed episode 08/09/2013. She felt he was having a stroke. 911 was called and he was transported to [facility]. Carotid ultrasound showed mild bilateral carotid disease. INR at the time was greater than 2. CT scan of the brain, MRI of the brain showed diffuse cortical atrophy, chronic ischemic white matter disease TIA or stroke. EKG 08/09/2013 showing atrial fibrillation with rate 130 beats per minute. He attributes everything to recent scalp resection procedure done twice with dermatology prior to the event. He had significant pain around his head prior to the episode. Significant oozing from the wound site. This was fixed on repeat surgery, scraping with additional stitches placed.  Echocardiogram was repeated showing ejection fraction 20-25%, moderate mitral regurgitation, moderate LVH  cholesterol is well controlled.  On his last clinic visit, he was having dizzy spells.  was taking Lasix periodically because he thought he was supposed to. Denied having any edema or shortness of breath or dizziness was worse with standing. Blood pressure at home in the 90s systolic. He has stopped the Lasix. Dizziness has improved.  Stress test was ordered which showed ischemia in the mid to distal anteroseptal wall and apical region. Cardiac catheterization was performed last week that showed mild  to moderate three-vessel disease, depressed ejection fraction estimated at 30-35% Recommendation was made to decrease his alcohol intake. Lisinopril was held previously secondary to low blood pressures and dizziness.  Previous echocardiogram shows severely depressed ejection fraction estimated at 25-30%, moderate MR, moderate pulmonary hypertension. CT Scan of his abdomen  showed coronary calcifications.   Recent chest x-ray showed calcified granuloma in the right lower lobe which was not seen in 2012  Labs show total cholesterol less than 100, LDL in the 40 range   EKG shows atrial flutter with ventricular rate 74 beats per minute, right bundle branch block, left anterior fascicular block   Outpatient Encounter Prescriptions as of 08/24/2013  Medication Sig  . albuterol (PROVENTIL HFA;VENTOLIN HFA) 108 (90 BASE) MCG/ACT inhaler Inhale 2 puffs into the lungs every 6 (six) hours as needed for wheezing.  Marland Kitchen allopurinol (ZYLOPRIM) 100 MG tablet Take 300 mg by mouth daily.  Marland Kitchen allopurinol (ZYLOPRIM) 300 MG tablet TAKE ONE TABLET BY MOUTH EVERY DAY  . aspirin 81 MG tablet Take 81 mg by mouth daily.  . carvedilol (COREG) 12.5 MG tablet TAKE ONE TABLET BY MOUTH TWICE DAILY  . cycloSPORINE (RESTASIS) 0.05 % ophthalmic emulsion Place 1 drop into both eyes 2 (two) times daily.  . furosemide (LASIX) 40 MG tablet Take 1 tablet (40 mg total) by mouth daily as needed.  Marland Kitchen glucose blood (ONE TOUCH TEST STRIPS) test strip Use once daily   . ipratropium (ATROVENT) 0.06 % nasal spray Place 1 spray into the nose as needed.   . metFORMIN (GLUCOPHAGE) 500 MG tablet Take 1 tablet (500 mg total) by mouth 2 (two) times  daily with a meal.  . NON FORMULARY Energy Greens Daily   . simvastatin (ZOCOR) 20 MG tablet Take 1 tablet (20 mg total) by mouth at bedtime.  Marland Kitchen warfarin (COUMADIN) 5 MG tablet Take as directed.     Review of Systems  Constitutional: Negative.   HENT: Negative.   Eyes: Negative.   Respiratory:  Negative.   Cardiovascular: Negative.   Gastrointestinal: Negative.   Endocrine: Negative.   Musculoskeletal: Negative.   Skin: Negative.   Allergic/Immunologic: Negative.   Neurological: Negative.        Recent falls, possibly mechanical  Hematological: Negative.   Psychiatric/Behavioral: Negative.   All other systems reviewed and are negative.   BP 110/70  Pulse 74  Ht 5\' 9"  (1.753 m)  Wt 202 lb 8 oz (91.853 kg)  BMI 29.89 kg/m2  Physical Exam  Nursing note and vitals reviewed. Constitutional: He is oriented to person, place, and time. He appears well-developed and well-nourished.  HENT:  Head: Normocephalic.  Nose: Nose normal.  Mouth/Throat: Oropharynx is clear and moist.  Ecchymosis/bruising around his left eye  Eyes: Conjunctivae are normal. Pupils are equal, round, and reactive to light.  Neck: Normal range of motion. Neck supple. No JVD present.  Cardiovascular: Normal rate, S1 normal, S2 normal, normal heart sounds and intact distal pulses.  An irregularly irregular rhythm present. Exam reveals no gallop and no friction rub.   No murmur heard. Pulmonary/Chest: Effort normal. No respiratory distress. He has decreased breath sounds. He has no wheezes. He has no rales. He exhibits no tenderness.  Abdominal: Soft. Bowel sounds are normal. He exhibits no distension. There is no tenderness.  Musculoskeletal: Normal range of motion. He exhibits no edema and no tenderness.  Lymphadenopathy:    He has no cervical adenopathy.  Neurological: He is alert and oriented to person, place, and time. Coordination normal.  Skin: Skin is warm and dry. No rash noted. No erythema.  Psychiatric: He has a normal mood and affect. His behavior is normal. Judgment and thought content normal.      Assessment and Plan

## 2013-08-24 NOTE — Patient Instructions (Signed)
You are doing well. No medication changes were made.  If you have any additional episodes, call the office We would order a 30 day monitor to look for arrhythmia  Please call us if you have new issues that need to be addressed before your next appt.  Your physician wants you to follow-up in: 6 months.  You will receive a reminder letter in the mail two months in advance. If you don't receive a letter, please call our office to schedule the follow-up appointment.

## 2013-08-24 NOTE — Assessment & Plan Note (Signed)
Encouraged him to stay on his simvastatin. 

## 2013-08-24 NOTE — Assessment & Plan Note (Signed)
Ejection fraction 25-30% on recent echocardiogram. Recommended he stop any alcohol. We'll continue beta blocker. He does not want ACE inhibitors as blood pressure running low and he feels dizzy on additional medications.

## 2013-08-24 NOTE — Assessment & Plan Note (Signed)
Etiology of his episode is unclear. Given his severely depressed ejection fraction in the 20% range, I'm concerned about arrhythmia. We did suggest a 30 day monitor to exclude a ventricular arrhythmia as a cause of his episode. He is quite certain that his episode was secondary to a complication from recent dermatologic/scalp surgery. There was significant ecchymosis around both eyes consistent with profound trauma to the top of his head during a dermatologic procedure. He denies any loss of consciousness episodes or trauma to his eyes that could have attributed to this ecchymoses. We have suggested he has additional episodes that we consider a 30 day monitor to rule out arrhythmia. Unable to advance his medications for cardiopathy as he is reluctant and has dizziness with ACE inhibitors from low blood pressure.

## 2013-08-24 NOTE — Assessment & Plan Note (Signed)
Chronic atrial flutter, on warfarin.

## 2013-08-25 ENCOUNTER — Telehealth: Payer: Self-pay | Admitting: *Deleted

## 2013-08-25 NOTE — Telephone Encounter (Signed)
Thanks

## 2013-08-25 NOTE — Telephone Encounter (Signed)
Patient's wife said she mentioned at the last OV that the patient had seen Dr. Terri Piedra (derm) who had removed a spot (I think on his head, not sure).  This spot was bothering the patient so he scheduled another appt to see Dr. Terri Piedra.  At that appt, Dr. Terri Piedra said he had to clean the area out and put in a few stitches.  When Dr. Dorita Sciara office called to follow up, the patient was feeling better but the office said a culture was sent to the lab and the patient has a bacterial infection in the wound and placed him on antibiotics.  Patient's wife wanted you to know.

## 2013-08-25 NOTE — Telephone Encounter (Signed)
Pt is taking Bactrim x 10 days.  Instructed pt to take 2.5mg  Coumadin until he finishes abx.  Will re-check INR 12/15.

## 2013-08-25 NOTE — Telephone Encounter (Signed)
Thanks.  I didn't think this looked like it needed abx, but I'll defer to derm.  Please find out what abx/dose/duration and then send to [facility] in case she needs to adjust his coumadin.  Thanks.

## 2013-08-28 ENCOUNTER — Encounter: Payer: Self-pay | Admitting: Family Medicine

## 2013-08-29 ENCOUNTER — Encounter: Payer: Self-pay | Admitting: Neurology

## 2013-08-29 ENCOUNTER — Ambulatory Visit (INDEPENDENT_AMBULATORY_CARE_PROVIDER_SITE_OTHER): Payer: Medicare Other | Admitting: Neurology

## 2013-08-29 VITALS — BP 116/60 | HR 80 | Temp 98.8°F | Ht 69.0 in | Wt 202.0 lb

## 2013-08-29 DIAGNOSIS — G459 Transient cerebral ischemic attack, unspecified: Secondary | ICD-10-CM

## 2013-08-29 NOTE — Patient Instructions (Signed)
Encouraged to stop smoking Return to clinic in 26-months

## 2013-08-29 NOTE — Progress Notes (Signed)
[facility] Neurology Division Clinic Note - Initial Visit   Date: 08/29/2013    Benjamin Soto MRN: [id number] DOB: [birth year]   Dear Dr Para March:  Thank you for your kind referral of Benjamin Soto for consultation of altered mental status. Although his history is well known to you, please allow [place] to reiterate it for the purpose of our medical record. The patient was accompanied to the clinic by wife.   History of Present Illness: Benjamin Soto is a 77 y.o. right-handed male of [place] ancestry with history of coronary artery disease, dilated cardiomyopathy (EF 20-25%), atrial fibrillation on anticoagulation, hypertension, hyperlipidemia and diabetes presenting for evaluation of altered mental status.   On 08/09/2013, wife went grocery shopping and when she arrived, approximately 1-hour later, she found him sitting in his chair, what she describes as "delirious". When she asked him how he was, he did not reply. He apparently was not talking and did attempt to vocalize words but they were incoherent.  She called 911 and he was taken to the Emergency Department and admitted for work-up of aphasia.  INR > 2. He slowly returned to baseline by the time he returned to the ED, but there was some confusion regarding orientation. There was no motor weakness, facial droop, sensory changes, or ataxia. The wife states that his heart rate was elevated.  EKG 08/09/2013 showing atrial fibrillation with rate 130 beats per minute.  He has MRI of the brain which did not show any acute stroke. There was chronic left cerebellar infarct and chronic small vessel disease. Ultrasound of the carotids showed less than 50% stenosis bilaterally of the ICA. There was mild calcified plaque at both carotid bifurcations.  When I asked the patient regarding this episode, he recalls it and says he was watching [employer] business report. He does not give specific details about his ability or inability to talk at the time.   His memory impairment is also unclear, but his wife says he was okay after the episode. There was no perseveration following the event. He was discharged in stable condition and asked to follow-up with his cardiologist and neurologist.  Since this episode, he he has returned back to baseline and has had no further events.  No prior history of stroke or TIAs.   Of note, on 11/17, he went to see his dermatologist for a growth on his scalp and underwent resection of it. Within a few days he developed swelling of his scalp, eyes and upper face. He was concerned that there was a infection and may have contributed to his change in mental status.  When I asked if he had hit his head or fallen because imaging showed a scalp hematoma, he denies this and says he it is probably due to his recent resection.   Past Medical History  Diagnosis Date  . Atrial fibrillation 01/20/2003  . Diabetes mellitus, type 2 09/21/92  . Gout 04/22/2003    per Dr Gavin Potters  . Hyperlipidemia 05/22/01  . Knee pain, right     [facility], Dr Gavin Potters with rheumatology  . Tachycardia 1993    hospital[place] in [place]  . History of colon polyps 10/26/02    colonoscopy  . Hypogonadism male   . Elbow fracture, right     2014    Past Surgical History  Procedure Laterality Date  . Bunionectomy  02/2004    R MTP  . Cardiac catheterization  1995    [facility]  .  Cardiac catheterization  10/14    [facility]  . Cyst      head     Medications:  Current Outpatient Prescriptions on File Prior to Visit  Medication Sig Dispense Refill  . albuterol (PROVENTIL HFA;VENTOLIN HFA) 108 (90 BASE) MCG/ACT inhaler Inhale 2 puffs into the lungs every 6 (six) hours as needed for wheezing.  1 Inhaler  0  . allopurinol (ZYLOPRIM) 300 MG tablet TAKE ONE TABLET BY MOUTH EVERY DAY  90 tablet  1  . aspirin 81 MG tablet Take 81 mg by mouth daily.      . carvedilol (COREG) 12.5 MG tablet TAKE ONE TABLET BY MOUTH TWICE DAILY   180 tablet  1  . cycloSPORINE (RESTASIS) 0.05 % ophthalmic emulsion Place 1 drop into both eyes 2 (two) times daily.      Marland Kitchen glucose blood (ONE TOUCH TEST STRIPS) test strip Use once daily       . ipratropium (ATROVENT) 0.06 % nasal spray Place 1 spray into the nose as needed.       . metFORMIN (GLUCOPHAGE) 500 MG tablet Take 1 tablet (500 mg total) by mouth 2 (two) times daily with a meal.  180 tablet  3  . NON FORMULARY Energy Greens Daily       . simvastatin (ZOCOR) 20 MG tablet Take 1 tablet (20 mg total) by mouth at bedtime.  90 tablet  3  . warfarin (COUMADIN) 5 MG tablet Take as directed.      Marland Kitchen allopurinol (ZYLOPRIM) 100 MG tablet Take 300 mg by mouth daily.      . furosemide (LASIX) 40 MG tablet Take 1 tablet (40 mg total) by mouth daily as needed.  90 tablet  3   No current facility-administered medications on file prior to visit.    Allergies:  Allergies  Allergen Reactions  . Lisinopril     REACTION: hyperkalemia at high dose  . Varenicline Tartrate     REACTION: vivid dreams, nausea    Family History: Family History  Problem Relation Age of Onset  . Diabetes Father   . Other Mother     Deceased  . Prostate cancer Neg Hx   . Healthy Son   . Colon cancer Daughter     Cancer free    Social History: History   Social History  . Marital Status: Married    Spouse Name: N/A    Number of Children: 2  . Years of Education: N/A   Occupational History  . 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 Smoking status: Former Smoker -- 0.25 packs/day for 0 years    Types: Cigarettes, Pipe    Quit date: 08/09/2013  . Smokeless tobacco: Never Used     Comment: 1/2 pack per week  . Alcohol Use: 0.5 oz/week    1 drink(s) per week     Comment: drinks 1-2 glasses of red wine daily  . Drug Use: No  . Sexual Activity: Not on file   Other Topics Concern  . Not on file   Social History  Narrative   Married since 1965.  They have two healthy grown children.   Former smoker, quit 1995, 35 pyh, started back as of 2011 with pipe smoking    Review of Systems:  CONSTITUTIONAL: No fevers, chills, night sweats, or weight loss.   EYES: No visual changes or eye pain ENT: No hearing changes.  No history of nose bleeds.   RESPIRATORY: No cough, wheezing and shortness of breath.   CARDIOVASCULAR: Negative for chest pain, and palpitations.   GI: Negative for abdominal discomfort, blood in stools or black stools.  No recent change in bowel habits.   GU:  No history of incontinence.   MUSCLOSKELETAL: No history of joint pain or swelling.  No myalgias.   SKIN: Negative for lesions, rash, and itching.   HEMATOLOGY/ONCOLOGY: Negative for prolonged bleeding, bruising easily, and swollen nodes.   ENDOCRINE: Negative for cold or heat intolerance, polydipsia or goiter.   PSYCH:  No depression or anxiety symptoms.   NEURO: As Above.   Vital Signs:  BP 116/60  Pulse 80  Temp(Src) 98.8 F (37.1 C) (Oral)  Ht 5\' 9"  (1.753 m)  Wt 202 lb (91.627 kg)  BMI 29.82 kg/m2   General Medical Exam:   General:  Well appearing, comfortable.   Eyes/ENT: see cranial nerve examination.   Neck: No masses appreciated.  Full range of motion without tenderness.  No carotid bruits. Respiratory:  Clear to auscultation, good air entry bilaterally.   Cardiac:  Irregularly irregular  Extremities:  No deformities, edema, or skin discoloration. Good capillary refill.   Skin:  Skin color, texture, turgor normal. No rashes or lesions.  Neurological Exam: MENTAL STATUS including orientation to time, place, person, recent and remote memory, attention span and concentration, language, and fund of knowledge is normal.  Speech is not dysarthric, heavy [place] accent.  Naming is intact.   CRANIAL NERVES: II:  No visual field defects.  Unremarkable fundi.   III-IV-VI: Pupils equal round and reactive to light.  Normal  conjugate, extra-ocular eye movements in all directions of gaze.  No nystagmus.  No ptosis prior.  V:  Normal facial sensation.    VII:  Normal facial symmetry and movements.  VIII:  Normal hearing and vestibular function.   IX-X:  Normal palatal movement.   XI:  Normal shoulder shrug and head rotation.   XII:  Normal tongue strength and range of motion, no deviation or fasciculation.  MOTOR:  No atrophy, fasciculations or abnormal movements.  No pronator drift.  Tone is normal.    Right Upper Extremity:    Left Upper Extremity:    Deltoid  5/5   Deltoid  5/5   Biceps  5/5   Biceps  5/5   Triceps  5/5   Triceps  5/5   Wrist extensors  5/5   Wrist extensors  5/5   Wrist flexors  5/5   Wrist flexors  5/5   Finger extensors  5/5   Finger extensors  5/5   Finger flexors  5/5   Finger flexors  5/5   Dorsal interossei  5/5   Dorsal interossei  5/5   Abductor pollicis  5/5   Abductor pollicis  5/5   Tone (Ashworth scale)  0  Tone (Ashworth scale)  0   Right Lower Extremity:    Left Lower Extremity:    Hip flexors  5/5   Hip flexors  5/5   Hip extensors  5/5   Hip extensors  5/5   Knee flexors  5/5   Knee flexors  5/5   Knee extensors  5/5   Knee extensors  5/5   Dorsiflexors  5/5   Dorsiflexors  5/5   Plantarflexors  5/5   Plantarflexors  5/5   Toe extensors  5/5   Toe extensors  5/5   Toe flexors  5/5  Toe flexors  5/5   Tone (Ashworth scale)  0  Tone (Ashworth scale)  0   MSRs:  Right                                                                 Left brachioradialis 2+  brachioradialis 2+  biceps 2+  biceps 2+  triceps 2+  triceps 2+  patellar 1+  patellar 1+  ankle jerk 0  ankle jerk 0  Hoffman no  Hoffman no  plantar response down  plantar response down   SENSORY:  Normal and symmetric perception of light touch, pinprick, vibration, and proprioception.  Romberg's sign absent.   COORDINATION/GAIT: Normal finger-to- nose-finger and heel-to-shin.  Intact rapid alternating  movements bilaterally.  Able to rise from a chair without using arms.  Gait narrow based and stable. Tandem and stressed gait intact.   Data: MRI of the brain 08/10/2013: No restricted diffusion to suggest acute infarction. There was chronic left cerebellar infarct and chronic small vessel disease. Large and brought a scalp hematoma.  Ultrasound of the carotids 08/10/2013: less than 50% stenosis bilaterally of the ICA. There was mild calcified plaque at both carotid bifurcations.  Echo 08/10/2013: Left ventricular ejection fraction is 20-25% Severely decreased global left ventricular systolic function with global hypokinesis. Moderate concentric left ventricular hypertrophy Moderately dilated left atrium and right atrium. Moderate mitral valve regurgitation  Component     Latest Ref Rng 08/01/2012 02/02/2013  TSH     0.35 - 5.50 uIU/mL 2.30     IMPRESSION: Mr. Nauta is a 77 year-old gentleman presenting for evaluation of expressive aphasia and mental status changes which occurred on 08/09/2013. His neurological examination is non-focal today.  Based on his history and exam, I suspect that he had a TIA which manifested as expressive aphasia and altered sensorium. Fortunately, there were no residual deficits and he has returned back to baseline with no new events.    His risk factors for stroke including atrial fibrillation (with dilated atrium), hypertension, previous tobacco use, hypertension, diabetes.  I suspect that his TIA was likely due to small embolic event from his atrial fibrillation. At this time, he is already being managed appropriately with risk factors that are well-controlled.   I explained to the patient and his wife that I do not feel that his history is consistent with transient global amnesia.   PLAN/RECOMMENDATIONS:  1. Continue secondary risk prevention:  - anticoagulation for atrial fibrillation  - tight glycemic control  - continued monitoring of his cholesterol  and blood pressure  - encouraged patient to stop smoking 2. Return to clinic in 57-months.   The duration of this appointment visit was 60 minutes of face-to-face time with the patient.  Greater than 50% of this time was spent in counseling, explanation of diagnosis, planning of further management, and coordination of care.   Thank you for allowing me to participate in patient's care.  If I can answer any additional questions, I would be pleased to do so.    Sincerely,    Lakeria Starkman K. Allena Katz, DO

## 2013-08-30 ENCOUNTER — Ambulatory Visit: Payer: Medicare Other | Admitting: Cardiovascular Disease

## 2013-09-04 ENCOUNTER — Ambulatory Visit: Payer: Medicare Other

## 2013-09-04 ENCOUNTER — Ambulatory Visit (INDEPENDENT_AMBULATORY_CARE_PROVIDER_SITE_OTHER): Payer: Medicare Other | Admitting: Family Medicine

## 2013-09-04 ENCOUNTER — Other Ambulatory Visit: Payer: Self-pay | Admitting: Family Medicine

## 2013-09-04 DIAGNOSIS — Z5181 Encounter for therapeutic drug level monitoring: Secondary | ICD-10-CM

## 2013-09-04 DIAGNOSIS — I4891 Unspecified atrial fibrillation: Secondary | ICD-10-CM

## 2013-09-04 DIAGNOSIS — Z7901 Long term (current) use of anticoagulants: Secondary | ICD-10-CM

## 2013-09-04 LAB — POCT INR: INR: 1.8

## 2013-09-12 ENCOUNTER — Encounter (HOSPITAL_COMMUNITY): Payer: Self-pay | Admitting: Emergency Medicine

## 2013-09-12 ENCOUNTER — Inpatient Hospital Stay (HOSPITAL_COMMUNITY)
Admission: EM | Admit: 2013-09-12 | Discharge: 2013-09-16 | DRG: 378 | Disposition: A | Discharge status: Home / Self Care | Payer: Medicare Other | Attending: Internal Medicine | Admitting: Internal Medicine

## 2013-09-12 ENCOUNTER — Telehealth: Payer: Self-pay

## 2013-09-12 DIAGNOSIS — I479 Paroxysmal tachycardia, unspecified: Secondary | ICD-10-CM

## 2013-09-12 DIAGNOSIS — N259 Disorder resulting from impaired renal tubular function, unspecified: Secondary | ICD-10-CM

## 2013-09-12 DIAGNOSIS — Z800 Family history of malignant neoplasm of digestive organs: Secondary | ICD-10-CM

## 2013-09-12 DIAGNOSIS — Z7901 Long term (current) use of anticoagulants: Secondary | ICD-10-CM

## 2013-09-12 DIAGNOSIS — Z8719 Personal history of other diseases of the digestive system: Secondary | ICD-10-CM

## 2013-09-12 DIAGNOSIS — N529 Male erectile dysfunction, unspecified: Secondary | ICD-10-CM

## 2013-09-12 DIAGNOSIS — Z7189 Other specified counseling: Secondary | ICD-10-CM

## 2013-09-12 DIAGNOSIS — Z7982 Long term (current) use of aspirin: Secondary | ICD-10-CM

## 2013-09-12 DIAGNOSIS — I482 Chronic atrial fibrillation, unspecified: Secondary | ICD-10-CM | POA: Diagnosis present

## 2013-09-12 DIAGNOSIS — I10 Essential (primary) hypertension: Secondary | ICD-10-CM

## 2013-09-12 DIAGNOSIS — L719 Rosacea, unspecified: Secondary | ICD-10-CM

## 2013-09-12 DIAGNOSIS — Z0000 Encounter for general adult medical examination without abnormal findings: Secondary | ICD-10-CM

## 2013-09-12 DIAGNOSIS — H04129 Dry eye syndrome of unspecified lacrimal gland: Secondary | ICD-10-CM

## 2013-09-12 DIAGNOSIS — E119 Type 2 diabetes mellitus without complications: Secondary | ICD-10-CM

## 2013-09-12 DIAGNOSIS — E785 Hyperlipidemia, unspecified: Secondary | ICD-10-CM

## 2013-09-12 DIAGNOSIS — I951 Orthostatic hypotension: Secondary | ICD-10-CM

## 2013-09-12 DIAGNOSIS — E291 Testicular hypofunction: Secondary | ICD-10-CM

## 2013-09-12 DIAGNOSIS — I4891 Unspecified atrial fibrillation: Secondary | ICD-10-CM

## 2013-09-12 DIAGNOSIS — K5731 Diverticulosis of large intestine without perforation or abscess with bleeding: Principal | ICD-10-CM | POA: Diagnosis present

## 2013-09-12 DIAGNOSIS — R4182 Altered mental status, unspecified: Secondary | ICD-10-CM

## 2013-09-12 DIAGNOSIS — M109 Gout, unspecified: Secondary | ICD-10-CM

## 2013-09-12 DIAGNOSIS — I420 Dilated cardiomyopathy: Secondary | ICD-10-CM

## 2013-09-12 DIAGNOSIS — R062 Wheezing: Secondary | ICD-10-CM

## 2013-09-12 DIAGNOSIS — D61818 Other pancytopenia: Secondary | ICD-10-CM | POA: Diagnosis not present

## 2013-09-12 DIAGNOSIS — K922 Gastrointestinal hemorrhage, unspecified: Secondary | ICD-10-CM

## 2013-09-12 DIAGNOSIS — R3129 Other microscopic hematuria: Secondary | ICD-10-CM

## 2013-09-12 DIAGNOSIS — I509 Heart failure, unspecified: Secondary | ICD-10-CM | POA: Diagnosis present

## 2013-09-12 DIAGNOSIS — I2510 Atherosclerotic heart disease of native coronary artery without angina pectoris: Secondary | ICD-10-CM

## 2013-09-12 DIAGNOSIS — M79609 Pain in unspecified limb: Secondary | ICD-10-CM

## 2013-09-12 DIAGNOSIS — R0981 Nasal congestion: Secondary | ICD-10-CM

## 2013-09-12 DIAGNOSIS — F17200 Nicotine dependence, unspecified, uncomplicated: Secondary | ICD-10-CM

## 2013-09-12 DIAGNOSIS — I5022 Chronic systolic (congestive) heart failure: Secondary | ICD-10-CM

## 2013-09-12 DIAGNOSIS — I428 Other cardiomyopathies: Secondary | ICD-10-CM | POA: Diagnosis present

## 2013-09-12 DIAGNOSIS — K5730 Diverticulosis of large intestine without perforation or abscess without bleeding: Secondary | ICD-10-CM | POA: Diagnosis present

## 2013-09-12 DIAGNOSIS — Z87891 Personal history of nicotine dependence: Secondary | ICD-10-CM

## 2013-09-12 DIAGNOSIS — D62 Acute posthemorrhagic anemia: Secondary | ICD-10-CM

## 2013-09-12 DIAGNOSIS — R413 Other amnesia: Secondary | ICD-10-CM

## 2013-09-12 DIAGNOSIS — D696 Thrombocytopenia, unspecified: Secondary | ICD-10-CM | POA: Diagnosis present

## 2013-09-12 DIAGNOSIS — Z8639 Personal history of other endocrine, nutritional and metabolic disease: Secondary | ICD-10-CM | POA: Diagnosis present

## 2013-09-12 HISTORY — DX: Personal history of other diseases of the digestive system: Z87.19

## 2013-09-12 LAB — CBC
HCT: 41.2 % (ref 39.0–52.0)
HCT: 36.4 % — ABNORMAL LOW (ref 39.0–52.0)
HCT: 34 % — ABNORMAL LOW (ref 39.0–52.0)
Hemoglobin: 13.3 g/dL (ref 13.0–17.0)
Hemoglobin: 11.8 g/dL — ABNORMAL LOW (ref 13.0–17.0)
Hemoglobin: 11.1 g/dL — ABNORMAL LOW (ref 13.0–17.0)
MCH: 32 pg (ref 26.0–34.0)
MCH: 32 pg (ref 26.0–34.0)
MCH: 32.3 pg (ref 26.0–34.0)
MCHC: 32.3 g/dL (ref 30.0–36.0)
MCHC: 32.4 g/dL (ref 30.0–36.0)
MCHC: 32.6 g/dL (ref 30.0–36.0)
MCV: 99.3 fL (ref 78.0–100.0)
MCV: 98.6 fL (ref 78.0–100.0)
MCV: 98.8 fL (ref 78.0–100.0)
Platelets: 141 10*3/uL — ABNORMAL LOW (ref 150–400)
Platelets: 122 10*3/uL — ABNORMAL LOW (ref 150–400)
Platelets: 127 10*3/uL — ABNORMAL LOW (ref 150–400)
RBC: 4.15 MIL/uL — ABNORMAL LOW (ref 4.22–5.81)
RBC: 3.69 MIL/uL — ABNORMAL LOW (ref 4.22–5.81)
RBC: 3.44 MIL/uL — ABNORMAL LOW (ref 4.22–5.81)
RDW: 14.9 % (ref 11.5–15.5)
RDW: 14.9 % (ref 11.5–15.5)
RDW: 14.9 % (ref 11.5–15.5)
WBC: 5.3 10*3/uL (ref 4.0–10.5)
WBC: 4.4 10*3/uL (ref 4.0–10.5)
WBC: 4.1 10*3/uL (ref 4.0–10.5)

## 2013-09-12 LAB — URINALYSIS, ROUTINE W REFLEX MICROSCOPIC
Bilirubin Urine: NEGATIVE
Glucose, UA: NEGATIVE mg/dL
Ketones, ur: 15 mg/dL — AB
Leukocytes, UA: NEGATIVE
Nitrite: NEGATIVE
Protein, ur: 100 mg/dL — AB
Specific Gravity, Urine: 1.018 (ref 1.005–1.030)
Urobilinogen, UA: 0.2 mg/dL (ref 0.0–1.0)
pH: 5 (ref 5.0–8.0)

## 2013-09-12 LAB — COMPREHENSIVE METABOLIC PANEL
ALT: 16 U/L (ref 0–53)
AST: 20 U/L (ref 0–37)
Albumin: 3.6 g/dL (ref 3.5–5.2)
Alkaline Phosphatase: 67 U/L (ref 39–117)
BUN: 17 mg/dL (ref 6–23)
CO2: 25 mEq/L (ref 19–32)
Calcium: 8.8 mg/dL (ref 8.4–10.5)
Chloride: 105 mEq/L (ref 96–112)
Creatinine, Ser: 1.11 mg/dL (ref 0.50–1.35)
GFR calc Af Amer: 73 mL/min — ABNORMAL LOW (ref >90)
GFR calc non Af Amer: 63 mL/min — ABNORMAL LOW (ref >90)
Glucose, Bld: 96 mg/dL (ref 70–99)
Potassium: 5.1 mEq/L (ref 3.5–5.1)
Sodium: 139 mEq/L (ref 135–145)
Total Bilirubin: 0.6 mg/dL (ref 0.3–1.2)
Total Protein: 6.2 g/dL (ref 6.0–8.3)

## 2013-09-12 LAB — PROTIME-INR
INR: 2.11 — ABNORMAL HIGH (ref 0.00–1.49)
Prothrombin Time: 23 seconds — ABNORMAL HIGH (ref 11.6–15.2)

## 2013-09-12 LAB — GLUCOSE, CAPILLARY
Glucose-Capillary: 83 mg/dL (ref 70–99)
Glucose-Capillary: 143 mg/dL — ABNORMAL HIGH (ref 70–99)

## 2013-09-12 LAB — OCCULT BLOOD, POC DEVICE: Fecal Occult Bld: POSITIVE — AB

## 2013-09-12 LAB — HEMOGLOBIN A1C
Hgb A1c MFr Bld: 5.7 % — ABNORMAL HIGH (ref <5.7)
Mean Plasma Glucose: 117 mg/dL — ABNORMAL HIGH (ref <117)

## 2013-09-12 LAB — ABO/RH: ABO/RH(D): A POS

## 2013-09-12 LAB — URINE MICROSCOPIC-ADD ON

## 2013-09-12 MED ORDER — SODIUM CHLORIDE 0.9 % IJ SOLN
3.0000 mL | Freq: Two times a day (BID) | INTRAMUSCULAR | Status: DC
  Administered 2013-09-12 – 2013-09-16 (×7): 3 mL via INTRAVENOUS

## 2013-09-12 MED ORDER — MORPHINE SULFATE 2 MG/ML IJ SOLN: 1.0000 mg | INTRAMUSCULAR | Status: DC | PRN

## 2013-09-12 MED ORDER — PANTOPRAZOLE SODIUM 40 MG IV SOLR
40.0000 mg | Freq: Two times a day (BID) | INTRAVENOUS | Status: DC
  Administered 2013-09-12 – 2013-09-14 (×5): 40 mg via INTRAVENOUS
  Filled 2013-09-12 (×7): qty 40

## 2013-09-12 MED ORDER — OXYCODONE HCL 5 MG PO TABS: 5.0000 mg | ORAL_TABLET | ORAL | Status: DC | PRN

## 2013-09-12 MED ORDER — SIMVASTATIN 20 MG PO TABS
20.0000 mg | ORAL_TABLET | Freq: Every day | ORAL | Status: DC
  Administered 2013-09-12 – 2013-09-15 (×4): 20 mg via ORAL
  Filled 2013-09-12 (×7): qty 1

## 2013-09-12 MED ORDER — SODIUM CHLORIDE 0.9 % IV SOLN
INTRAVENOUS | Status: AC
  Administered 2013-09-12: 16:00:00 via INTRAVENOUS

## 2013-09-12 MED ORDER — ALBUTEROL SULFATE (5 MG/ML) 0.5% IN NEBU: 2.5000 mg | INHALATION_SOLUTION | RESPIRATORY_TRACT | Status: DC | PRN

## 2013-09-12 MED ORDER — INSULIN ASPART 100 UNIT/ML ~~LOC~~ SOLN
0.0000 [IU] | Freq: Three times a day (TID) | SUBCUTANEOUS | Status: DC
  Administered 2013-09-14: 1 [IU] via SUBCUTANEOUS

## 2013-09-12 MED ORDER — CYCLOSPORINE 0.05 % OP EMUL
1.0000 [drp] | Freq: Two times a day (BID) | OPHTHALMIC | Status: DC
  Administered 2013-09-12 – 2013-09-16 (×8): 1 [drp] via OPHTHALMIC
  Filled 2013-09-12 (×9): qty 1

## 2013-09-12 MED ORDER — IPRATROPIUM BROMIDE 0.02 % IN SOLN: 0.5000 mg | RESPIRATORY_TRACT | Status: DC | PRN

## 2013-09-12 MED ORDER — ONDANSETRON HCL 4 MG/2ML IJ SOLN: 4.0000 mg | Freq: Four times a day (QID) | INTRAMUSCULAR | Status: DC | PRN

## 2013-09-12 MED ORDER — MAGNESIUM CITRATE PO SOLN
1.0000 | Freq: Once | ORAL | Status: AC | PRN
  Filled 2013-09-12: qty 296

## 2013-09-12 MED ORDER — IPRATROPIUM BROMIDE 0.06 % NA SOLN
1.0000 | Freq: Every day | NASAL | Status: DC | PRN
  Filled 2013-09-12: qty 15

## 2013-09-12 MED ORDER — SODIUM CHLORIDE 0.9 % IV BOLUS (SEPSIS)
1000.0000 mL | Freq: Once | INTRAVENOUS | Status: AC
  Administered 2013-09-12: 1000 mL via INTRAVENOUS

## 2013-09-12 MED ORDER — ALLOPURINOL 300 MG PO TABS
300.0000 mg | ORAL_TABLET | Freq: Every day | ORAL | Status: DC
  Administered 2013-09-13 – 2013-09-16 (×4): 300 mg via ORAL
  Filled 2013-09-12 (×4): qty 1

## 2013-09-12 MED ORDER — ALBUTEROL SULFATE HFA 108 (90 BASE) MCG/ACT IN AERS
2.0000 | INHALATION_SPRAY | Freq: Four times a day (QID) | RESPIRATORY_TRACT | Status: DC | PRN
  Filled 2013-09-12: qty 6.7

## 2013-09-12 MED ORDER — ACETAMINOPHEN 650 MG RE SUPP: 650.0000 mg | Freq: Four times a day (QID) | RECTAL | Status: DC | PRN

## 2013-09-12 MED ORDER — ONDANSETRON HCL 4 MG PO TABS: 4.0000 mg | ORAL_TABLET | Freq: Four times a day (QID) | ORAL | Status: DC | PRN

## 2013-09-12 MED ORDER — POLYETHYLENE GLYCOL 3350 17 G PO PACK
17.0000 g | PACK | Freq: Every day | ORAL | Status: DC | PRN
  Filled 2013-09-12: qty 1

## 2013-09-12 MED ORDER — ALUM & MAG HYDROXIDE-SIMETH 200-200-20 MG/5ML PO SUSP: 30.0000 mL | Freq: Four times a day (QID) | ORAL | Status: DC | PRN

## 2013-09-12 MED ORDER — SORBITOL 70 % SOLN
30.0000 mL | Freq: Every day | Status: DC | PRN
  Filled 2013-09-12: qty 30

## 2013-09-12 MED ORDER — CARVEDILOL 12.5 MG PO TABS
12.5000 mg | ORAL_TABLET | Freq: Two times a day (BID) | ORAL | Status: DC
  Administered 2013-09-12 – 2013-09-16 (×8): 12.5 mg via ORAL
  Filled 2013-09-12 (×10): qty 1

## 2013-09-12 MED ORDER — ACETAMINOPHEN 325 MG PO TABS: 650.0000 mg | ORAL_TABLET | Freq: Four times a day (QID) | ORAL | Status: DC | PRN

## 2013-09-12 NOTE — Telephone Encounter (Signed)
Patient Information:  Caller Name: [place]  Phone: [phone]  Patient: Benjamin Soto, Benjamin Soto  Gender: Male  DOB: [birth year]  Age: 77 Years  PCP: Crawford Givens Clelia Croft) [facility])  Office Follow Up:  Does the office need to follow up with this patient?: No  Instructions For The Office: N/A  RN Note:  Reported "explosion" of blood into toilet.  Toilet bowl filled with blood then had 2nd event with more blood and small amount of stool.  No abdominal or rectal pain. Amublatory.  No pallor.  On Coumadin. No vomiting.  FBS not tested. Instructed to lie down now.   Symptoms  Reason For Call & Symptoms: Emergent Call:  Passed large amount of blood without stool followed by 2nd occurance of blood with stool.  Reviewed Health History In EMR: N/A  Reviewed Medications In EMR: N/A  Reviewed Allergies In EMR: N/A  Reviewed Surgeries / Procedures: N/A  Date of Onset of Symptoms: 09/12/2013  Guideline(s) Used:  Rectal Bleeding  Disposition Per Guideline:   Go to ED Now (or to Office with PCP Approval)  Reason For Disposition Reached:   Bloody, black, or tarry bowel movements  Advice Given:  N/A  RN Overrode Recommendation:  Go To ED  Discussed disposition with office nurse/Rena.  Instructed to go to [facility] ED now.

## 2013-09-12 NOTE — Consult Note (Signed)
Unassigned Patient  Reason for Consult: Hematochezia Referring Physician: [facility]  Dimas Aguas HPI: This is a 77 year old male with a PMH of atrial fibrillation on coumadin, family history of colon cancer in his daughter at the age of 73, diverticula, and colonic polyps who presented with multiple bouts of painless hematochezia.  He reports that this is the firs time he has had painless hematochezia.  No problems with nausea, vomiting, fever, constipation, or diarrhea.  His last colonoscopy was performed by Dr. Markham Jordan in 11/2007 by Dr. Markham Jordan in [place], [place].  Large mouthed diverticula were found in the descending and sigmoid colon.    Past Medical History  Diagnosis Date  . Atrial fibrillation 01/20/2003  . Diabetes mellitus, type 2 09/21/92  . Gout 04/22/2003    per Dr Gavin Potters  . Hyperlipidemia 05/22/01  . Knee pain, right      Ortho, Dr Gavin Potters with rheumatology  . Tachycardia 1993    hospital[place] in [place]  . History of colon polyps 10/26/02    colonoscopy  . Hypogonadism male   . Elbow fracture, right     2014    Past Surgical History  Procedure Laterality Date  . Bunionectomy  02/2004    R MTP  . Cardiac catheterization  1995    normal, [facility]  . Cardiac catheterization  10/14    [facility]  . Cyst      head    Family History  Problem Relation Age of Onset  . Diabetes Father   . Other Mother     Deceased  . Prostate cancer Neg Hx   . Healthy Son   . Colon cancer Daughter     Cancer free    Social History:  reports that he quit smoking about 4 weeks ago. His smoking use included Cigarettes and Pipe. He smoked 0.25 packs per day for 0 years. He has never used smokeless tobacco. He reports that he drinks about 0.5 ounces of alcohol per week. He reports that he does not use illicit drugs.  Allergies:  Allergies  Allergen Reactions  . Lisinopril     REACTION: hyperkalemia at high dose  . Varenicline Tartrate     REACTION: vivid  dreams, nausea    Medications:  Scheduled: . [START ON 09/13/2013] allopurinol  300 mg Oral Daily  . carvedilol  12.5 mg Oral BID WC  . cycloSPORINE  1 drop Both Eyes BID  . pantoprazole (PROTONIX) IV  40 mg Intravenous Q12H  . simvastatin  20 mg Oral q1800  . sodium chloride  3 mL Intravenous Q12H   Continuous: . sodium chloride 50 mL/hr at 09/12/13 1617    Results for orders placed during the hospital encounter of 09/12/13 (from the past 24 hour(s))  PROTIME-INR     Status: Abnormal   Collection Time    09/12/13 11:43 AM      Result Value Range   Prothrombin Time 23.0 (*) 11.6 - 15.2 seconds   INR 2.11 (*) 0.00 - 1.49  CBC     Status: Abnormal   Collection Time    09/12/13 11:43 AM      Result Value Range   WBC 5.3  4.0 - 10.5 K/uL   RBC 4.15 (*) 4.22 - 5.81 MIL/uL   Hemoglobin 13.3  13.0 - 17.0 g/dL   HCT 16.1  09.6 - 04.5 %   MCV 99.3  78.0 - 100.0 fL   MCH 32.0  26.0 - 34.0 pg  MCHC 32.3  30.0 - 36.0 g/dL   RDW 56.2  13.0 - 86.5 %   Platelets 141 (*) 150 - 400 K/uL  COMPREHENSIVE METABOLIC PANEL     Status: Abnormal   Collection Time    09/12/13 11:43 AM      Result Value Range   Sodium 139  135 - 145 mEq/L   Potassium 5.1  3.5 - 5.1 mEq/L   Chloride 105  96 - 112 mEq/L   CO2 25  19 - 32 mEq/L   Glucose, Bld 96  70 - 99 mg/dL   BUN 17  6 - 23 mg/dL   Creatinine, Ser 7.84  0.50 - 1.35 mg/dL   Calcium 8.8  8.4 - 69.6 mg/dL   Total Protein 6.2  6.0 - 8.3 g/dL   Albumin 3.6  3.5 - 5.2 g/dL   AST 20  0 - 37 U/L   ALT 16  0 - 53 U/L   Alkaline Phosphatase 67  39 - 117 U/L   Total Bilirubin 0.6  0.3 - 1.2 mg/dL   GFR calc non Af Amer 63 (*) >90 mL/min   GFR calc Af Amer 73 (*) >90 mL/min  TYPE AND SCREEN     Status: None   Collection Time    09/12/13 11:43 AM      Result Value Range   ABO/RH(D) A POS     Antibody Screen NEG     Sample Expiration 09/15/2013    ABO/RH     Status: None   Collection Time    09/12/13 11:43 AM      Result Value Range    ABO/RH(D) A POS    OCCULT BLOOD, POC DEVICE     Status: Abnormal   Collection Time    09/12/13 12:38 PM      Result Value Range   Fecal Occult Bld POSITIVE (*) NEGATIVE  GLUCOSE, CAPILLARY     Status: None   Collection Time    09/12/13  4:20 PM      Result Value Range   Glucose-Capillary 83  70 - 99 mg/dL     No results found.  ROS:  As stated above in the HPI otherwise negative.  Blood pressure 117/86, pulse 93, temperature 98 F (36.7 C), temperature source Oral, resp. rate 20, height 5\' 9"  (1.753 m), weight 198 lb 13.7 oz (90.2 kg), SpO2 98.00%.    PE: Gen: NAD, Alert and Oriented HEENT:  [place]/AT, EOMI Neck: Supple, no LAD Lungs: CTA Bilaterally CV: RRR without M/G/R ABM: Soft, NTND, +BS Ext: No C/C/E  Assessment/Plan: 1) Diverticula bleed. 2) Elevated INR secondary to coumadin. 3) Atrial fibrillation.   The patient's clinical presentation is consistent with a diverticular bleed.  His diverticula were confirmed with Dr. Cyndie Mull note during his colonoscopy in 2009.  I did review a CT scan from [facility] and there was the suggestion of cirrhotic changes in 2012, however, he is hemodynamically stable.  No reports of hematemesis to suggest a variceal bleed.  Plan: 1) Follow HGB. 2) Transfuse if necessary. 3) No colonoscopy at this time.  Rahn Lacuesta D 09/12/2013, 4:39 PM

## 2013-09-12 NOTE — ED Notes (Signed)
Pt states he went to the bathroom this morning and had a bowel movement and noticed blood in the toilet. Pt states he thought he had diarrhea but it was blood that came out like and "explosion". Pt is on coumadin, called his pcp and was told to come here to be checked out. Pt denies any pain, lightheadedness or dizziness.

## 2013-09-12 NOTE — Care Management Note (Signed)
    Page 1 of 1   09/12/2013     4:00:50 PM   CARE MANAGEMENT NOTE 09/12/2013  Patient:  Benjamin Soto, Benjamin Soto   Account Number:  [id number]  Date Initiated:  09/12/2013  Documentation initiated by:  HUTCHINSON,CRYSTAL  Subjective/Objective Assessment:   Admitted with rectal bleeding/ +hematochezia.     Action/Plan:   CM will continue to monitor for disposition needs.   Anticipated DC Date:  09/15/2013   Anticipated DC Plan:           Choice offered to / List presented to:             Status of service:  In process, will continue to follow Medicare Important Message given?   (If response is "NO", the following Medicare IM given date fields will be blank) Date Medicare IM given:   Date Additional Medicare IM given:    Discharge Disposition:    Per UR Regulation:    If discussed at Long Length of Stay Meetings, dates discussed:    Comments:

## 2013-09-12 NOTE — ED Provider Notes (Signed)
CSN: [id number]     Arrival date & time 09/12/13  1030 History   First MD Initiated Contact with Patient 09/12/13 1119     Chief Complaint  Patient presents with  . Rectal Bleeding   (Consider location/radiation/quality/duration/timing/severity/associated sxs/prior Treatment) Patient is a 77 y.o. male presenting with hematochezia.  Rectal Bleeding Quality:  Bright red Amount:  Moderate Duration: several hours. Timing:  Intermittent Progression:  Unchanged Chronicity:  New Context: spontaneously   Context: not hemorrhoids, not rectal injury and not rectal pain   Context comment:  On coumadin Relieved by:  Nothing Worsened by:  Defecation Ineffective treatments:  Time Associated symptoms: no abdominal pain, no dizziness, no fever, no hematemesis, no light-headedness and no vomiting     Past Medical History  Diagnosis Date  . Atrial fibrillation 01/20/2003  . Diabetes mellitus, type 2 09/21/92  . Gout 04/22/2003    per Dr Gavin Potters  . Hyperlipidemia 05/22/01  . Knee pain, right     [facility], Dr Gavin Potters with rheumatology  . Tachycardia 1993    hospitalized in [place]  . History of colon polyps 10/26/02    colonoscopy  . Hypogonadism male   . Elbow fracture, right     2014   Past Surgical History  Procedure Laterality Date  . Bunionectomy  02/2004    R [facility]TP  . Cardiac catheterization  1995    [facility]  . Cardiac catheterization  10/14    [facility]  . Cyst      head   Family History  Problem Relation Age of Onset  . Diabetes Father   . Other Mother     Deceased  . Prostate cancer Neg Hx   . Healthy Son   . Colon cancer Daughter     Cancer free   History  Substance Use Topics  . Smoking status: Former Smoker -- 0.25 packs/day for 0 years    Types: Cigarettes, Pipe    Quit date: 08/09/2013  . Smokeless tobacco: Never Used     Comment: 1/2 pack per week  . Alcohol Use: 0.5 oz/week    1 drink(s) per week     Comment: drinks 1-2 glasses  of red wine daily    Review of Systems  Constitutional: Negative for fever.  Gastrointestinal: Positive for hematochezia. Negative for vomiting, abdominal pain and hematemesis.  Neurological: Negative for dizziness and light-headedness.  All other systems reviewed and are negative.    Allergies  Lisinopril and Varenicline tartrate  Home Medications   Current Outpatient Rx  Name  Route  Sig  Dispense  Refill  . albuterol (PROVENTIL HFA;VENTOLIN HFA) 108 (90 BASE) MCG/ACT inhaler   Inhalation   Inhale 2 puffs into the lungs every 6 (six) hours as needed for wheezing.   1 Inhaler   0   . EXPIRED: allopurinol (ZYLOPRIM) 100 MG tablet   Oral   Take 300 mg by mouth daily.         Marland Kitchen allopurinol (ZYLOPRIM) 300 MG tablet      TAKE ONE TABLET BY MOUTH EVERY DAY   90 tablet   1   . aspirin 81 MG tablet   Oral   Take 81 mg by mouth daily.         . carvedilol (COREG) 12.5 MG tablet      TAKE ONE TABLET BY MOUTH TWICE DAILY   180 tablet   1   . cycloSPORINE (RESTASIS) 0.05 % ophthalmic emulsion  Both Eyes   Place 1 drop into both eyes 2 (two) times daily.         Marland Kitchen EXPIRED: furosemide (LASIX) 40 MG tablet   Oral   Take 1 tablet (40 mg total) by mouth daily as needed.   90 tablet   3   . glucose blood (ONE TOUCH TEST STRIPS) test strip      Use once daily          . ipratropium (ATROVENT) 0.06 % nasal spray   Nasal   Place 1 spray into the nose as needed.          . metFORMIN (GLUCOPHAGE) 500 MG tablet   Oral   Take 1 tablet (500 mg total) by mouth 2 (two) times daily with a meal.   180 tablet   3   . NON FORMULARY      Energy Greens Daily          . simvastatin (ZOCOR) 20 MG tablet      TAKE ONE TABLET BY MOUTH AT BEDTIME   90 tablet   0   . sulfamethoxazole-trimethoprim (BACTRIM DS) 800-160 MG per tablet               . warfarin (COUMADIN) 5 MG tablet      Take as directed.          BP 103/66  Pulse 86  Temp(Src) 97.6 F  (36.4 C) (Oral)  Resp 18  Wt 199 lb 9 oz (90.521 kg)  SpO2 98% Physical Exam  Nursing note and vitals reviewed. Constitutional: He is oriented to person, place, and time. He appears well-developed and well-nourished. No distress.  HENT:  Head: Normocephalic and atraumatic.  Mouth/Throat: Oropharynx is clear and moist.  Eyes: Conjunctivae are normal. Pupils are equal, round, and reactive to light. No scleral icterus.  Neck: Neck supple.  Cardiovascular: Normal rate, regular rhythm, normal heart sounds and intact distal pulses.   No murmur heard. Pulmonary/Chest: Effort normal and breath sounds normal. No stridor. No respiratory distress. He has no wheezes. He has no rales.  Abdominal: Soft. He exhibits no distension. There is no tenderness.  Genitourinary: Rectal exam shows no external hemorrhoid, no internal hemorrhoid, no fissure, no mass and no tenderness. Guaiac positive stool (maroon stool). Prostate is not tender.  Musculoskeletal: Normal range of motion. He exhibits no edema.  Neurological: He is alert and oriented to person, place, and time.  Skin: Skin is warm and dry. No rash noted.  Psychiatric: He has a normal mood and affect. His behavior is normal.    ED Course  Procedures (including critical care time) Labs Review Labs Reviewed  PROTIME-INR - Abnormal; Notable for the following:    Prothrombin Time 23.0 (*)    INR 2.11 (*)    All other components within normal limits  CBC - Abnormal; Notable for the following:    RBC 4.15 (*)    Platelets 141 (*)    All other components within normal limits  COMPREHENSIVE METABOLIC PANEL - Abnormal; Notable for the following:    GFR calc non Af Amer 63 (*)    GFR calc Af Amer 73 (*)    All other components within normal limits  OCCULT BLOOD, POC DEVICE - Abnormal; Notable for the following:    Fecal Occult Bld POSITIVE (*)    All other components within normal limits  TYPE AND SCREEN  ABO/RH   Imaging Review No results  found.  EKG Interpretation  None       MDM   1. GI bleed    77 yo male w a hx of A fib on coumadin presenting with rectal bleeding.  Painless.  Bright red.  3 episodes this morning.  No nausea or vomiting.  Well appearing.  Maroon blood on DRE.  I suspect lower GI bleed complicated by coumadin.  Colonoscopy in 2009 showed diverticulosis and small nonbleeding internal hemorrhoids.   Labwork reassuring.  Had another episode of blood per rectum in ED.  INR 2.1.  Discussed with Dr. Janee Morn for admission and Dr. Elnoria Howard (GI) who will consult.    Candyce Churn, MD 09/12/13 [phone]

## 2013-09-12 NOTE — ED Notes (Signed)
PT got out of bed and felt like he need to go to the bathroom and had episode blood in the toilet.  No abdominal pain.  Pt is on coumadin

## 2013-09-12 NOTE — Progress Notes (Signed)
Pt continues to have bright bloody stools with clots, asymptomatic at this time no complaints of dizziness or lightheadedness.

## 2013-09-12 NOTE — H&P (Signed)
[facility] History and Physical  Benjamin Soto ZOX:[id number] DOB: 09/23/1935 DOA: 09/12/2013  Referring physician: Dr Loretha Stapler PCP: Crawford Givens, MD   Chief Complaint: bleeding  HPI: Benjamin Soto is a 77 y.o. male  With history of atrial fibrillation on chronic Coumadin therapy, type 2 diabetes, hyperlipidemia, history of colonic polyps, and history of diverticulosis per colonoscopy who presents to the ED with sudden onset bright red blood per rectum. Patient states  Woke up this morning had a large bloody bowel movement and in the 2 smaller bloody bowel movements after that. Patient subsequently presented to the ED where he had 2 more episodes of bright red blood per rectum. Patient denies any dizziness, no chest pain, no shortness of breath, no nausea, no vomiting, no abdominal pain, no dysuria, no fever, no chills, no cough. Patient states uses Aleve sparingly. Patient denies any prior history of GI bleed. Patient stated recently had a colonoscopy about 2-3 years ago and was told everything was fine and didn't need any multiple colonoscopies. Patient states had to have a cyst removed from the top of his head and a such as Coumadin was held which was resumed a week ago. Patient was seen in the ED comprehensive metabolic profile to was unremarkable. CBC obtained had a platelet count of 141 otherwise was unremarkable. INR was 2.11. We were called to admit the patient for further evaluation and management.   Review of Systems: as per history of present illness otherwise negative. Constitutional:  No weight loss, night sweats, Fevers, chills, fatigue.  HEENT:  No headaches, Difficulty swallowing,Tooth/dental problems,Sore throat,  No sneezing, itching, ear ache, nasal congestion, post nasal drip,  Cardio-vascular:  No chest pain, Orthopnea, PND, swelling in lower extremities, anasarca, dizziness, palpitations  GI:  No heartburn, indigestion, abdominal pain, nausea, vomiting,  diarrhea, change in bowel habits, loss of appetite  Resp:  No shortness of breath with exertion or at rest. No excess mucus, no productive cough, No non-productive cough, No coughing up of blood.No change in color of mucus.No wheezing.No chest wall deformity  Skin:  no rash or lesions.  GU:  no dysuria, change in color of urine, no urgency or frequency. No flank pain.  Musculoskeletal:  No joint pain or swelling. No decreased range of motion. No back pain.  Psych:  No change in mood or affect. No depression or anxiety. No memory loss.   Past Medical History  Diagnosis Date  . Atrial fibrillation 01/20/2003  . Diabetes mellitus, type 2 09/21/92  . Gout 04/22/2003    per Dr Gavin Potters  . Hyperlipidemia 05/22/01  . Knee pain, right     [facility], Dr Gavin Potters with rheumatology  . Tachycardia 1993    hospital[place] in [place]  . History of colon polyps 10/26/02    colonoscopy  . Hypogonadism male   . Elbow fracture, right     2014   Past Surgical History  Procedure Laterality Date  . Bunionectomy  02/2004    R MTP  . Cardiac catheterization  1995    [facility], [facility]  . Cardiac catheterization  10/14    [facility]  . Cyst      head   Social History:  reports that he quit smoking about 4 weeks ago. His smoking use included Cigarettes and Pipe. He smoked 0.25 packs per day for 0 years. He has never used smokeless tobacco. He reports that he drinks about 0.5 ounces of alcohol per week. He reports that he does not  use illicit drugs.  Allergies  Allergen Reactions  . Lisinopril     REACTION: hyperkalemia at high dose  . Varenicline Tartrate     REACTION: vivid dreams, nausea    Family History  Problem Relation Age of Onset  . Diabetes Father   . Other Mother     Deceased  . Prostate cancer Neg Hx   . Healthy Son   . Colon cancer Daughter     Cancer free     Prior to Admission medications   Medication Sig Start Date End Date Taking? Authorizing Provider   albuterol (PROVENTIL HFA;VENTOLIN HFA) 108 (90 BASE) MCG/ACT inhaler Inhale 2 puffs into the lungs every 6 (six) hours as needed for wheezing. 05/05/13  Yes Joaquim Nam, MD  allopurinol (ZYLOPRIM) 100 MG tablet Take 300 mg by mouth daily. 08/08/12  Yes Joaquim Nam, MD  aspirin 81 MG tablet Take 81 mg by mouth daily.   Yes Historical Provider, MD  carvedilol (COREG) 12.5 MG tablet Take 12.5 mg by mouth 2 (two) times daily with a meal.   Yes Historical Provider, MD  cycloSPORINE (RESTASIS) 0.05 % ophthalmic emulsion Place 1 drop into both eyes 2 (two) times daily.   Yes Historical Provider, MD  glucose blood (ONE TOUCH TEST STRIPS) test strip Use once daily    Yes Historical Provider, MD  ipratropium (ATROVENT) 0.06 % nasal spray Place 1 spray into the nose daily as needed for rhinitis.  06/28/13  Yes Historical Provider, MD  metFORMIN (GLUCOPHAGE) 500 MG tablet Take 1 tablet (500 mg total) by mouth 2 (two) times daily with a meal. 08/08/12  Yes Joaquim Nam, MD  simvastatin (ZOCOR) 20 MG tablet Take 20 mg by mouth daily.   Yes Historical Provider, MD  warfarin (COUMADIN) 5 MG tablet Take 2.5-5 mg by mouth daily. Take 2.5mg  on Mon, Wed, and Fri. Take 5mg  all other days   Yes Historical Provider, MD  furosemide (LASIX) 40 MG tablet Take 1 tablet (40 mg total) by mouth daily as needed. 08/08/12 08/24/13  Joaquim Nam, MD   Physical Exam: Filed Vitals:   09/12/13 1445  BP: 118/88  Pulse: 133  Temp:   Resp: 22    BP 118/88  Pulse 133  Temp(Src) 97.6 F (36.4 C) (Oral)  Resp 22  Wt 90.521 kg (199 lb 9 oz)  SpO2 95%  General:  Appears calm and comfortable Eyes: PERRL, normal lids, irises & conjunctiva ENT: grossly normal hearing, lips & tongue. Dry mucous membranes Neck: no LAD, masses or thyromegaly Cardiovascular: irregularly irregular. No LE edema. Telemetry: SR, no arrhythmias  Respiratory: CTA bilaterally, no w/r/r. Normal respiratory effort. Abdomen: soft, ntnd Skin:  no rash or induration seen on limited exam Musculoskeletal: grossly normal tone BUE/BLE Psychiatric: grossly normal mood and affect, speech fluent and appropriate Neurologic: grossly non-focal.          Labs on Admission:  Basic Metabolic Panel:  Recent Labs Lab 09/12/13 1143  NA 139  K 5.1  CL 105  CO2 25  GLUCOSE 96  BUN 17  CREATININE 1.11  CALCIUM 8.8   Liver Function Tests:  Recent Labs Lab 09/12/13 1143  AST 20  ALT 16  ALKPHOS 67  BILITOT 0.6  PROT 6.2  ALBUMIN 3.6   No results found for this basename: LIPASE, AMYLASE,  in the last 168 hours No results found for this basename: AMMONIA,  in the last 168 hours CBC:  Recent Labs Lab 09/12/13 1143  WBC 5.3  HGB 13.3  HCT 41.2  MCV 99.3  PLT 141*   Cardiac Enzymes: No results found for this basename: CKTOTAL, CKMB, CKMBINDEX, TROPONINI,  in the last 168 hours  BNP (last 3 results) No results found for this basename: PROBNP,  in the last 8760 hours CBG: No results found for this basename: GLUCAP,  in the last 168 hours  Radiological Exams on Admission: No results found.  EKG: Independently reviewed. Not done  Assessment/Plan Principal Problem:   GI bleed Active Problems:   DIABETES MELLITUS, TYPE II   HYPERLIPIDEMIA   Essential hypertension, benign   Atrial fibrillation   Diverticulosis of colon   Systolic CHF, chronic   CAD (coronary artery disease)   Congestive dilated cardiomyopathy   GIB (gastrointestinal bleeding)  #1 GI bleed Patient is presenting with bright red blood per rectum. Patient with history of diverticulosis. Likely a diverticular bleed. Patient denies any excessive NSAID use.we'll admit patient to telemetry. Gentle hydration. Serial CBCs. PPI. Follow H&H. Hold Coumadin for now. Will defer resumption of Coumadin to gastroenterology. Consult with GI for further evaluation and management.  #2 diabetes mellitus Check a hemoglobin A1c. Place on sliding scale insulin.  #3  atrial fibrillation Continue Coreg for rate control. Will hold Coumadin secondary to problem #1.  #4 chronic systolic CHF/coronary artery disease/history of dilated cardiomyopathy Stable. Continue Coreg, Zocor. Hold Lasix. Follow.  #5 hypertension Stable. Continue Coreg.  #6 prophylaxis PPI for GI prophylaxis. SCDs for DVT prophylaxis.  Code Status: Full Family Communication: updated patient and wife at bedside. Disposition Plan: admit to telemetry  Time spent: 60 minutes  [facility] MD [facility] Pager [phone]

## 2013-09-12 NOTE — Telephone Encounter (Signed)
Alvino Chapel with [facility] said this morning pt had 2 episodes of "explosion of blood in commode", small amount of stool; pt is on Coumadin. No abdominal pain. Pt to go to ED for eval now.Alvino Chapel voiced understanding.

## 2013-09-12 NOTE — Telephone Encounter (Signed)
Agree with ER.

## 2013-09-12 NOTE — ED Notes (Signed)
Pt states he had 3 episodes of bleeding since this morning.

## 2013-09-13 ENCOUNTER — Ambulatory Visit: Payer: Medicare Other | Admitting: Neurology

## 2013-09-13 ENCOUNTER — Encounter: Payer: Self-pay | Admitting: *Deleted

## 2013-09-13 DIAGNOSIS — D62 Acute posthemorrhagic anemia: Secondary | ICD-10-CM | POA: Diagnosis not present

## 2013-09-13 DIAGNOSIS — D696 Thrombocytopenia, unspecified: Secondary | ICD-10-CM | POA: Diagnosis present

## 2013-09-13 LAB — CBC
HCT: 31.7 % — ABNORMAL LOW (ref 39.0–52.0)
HCT: 28.8 % — ABNORMAL LOW (ref 39.0–52.0)
HCT: 26.7 % — ABNORMAL LOW (ref 39.0–52.0)
Hemoglobin: 10.3 g/dL — ABNORMAL LOW (ref 13.0–17.0)
Hemoglobin: 9.3 g/dL — ABNORMAL LOW (ref 13.0–17.0)
Hemoglobin: 8.8 g/dL — ABNORMAL LOW (ref 13.0–17.0)
MCH: 31.8 pg (ref 26.0–34.0)
MCH: 31.7 pg (ref 26.0–34.0)
MCH: 32.2 pg (ref 26.0–34.0)
MCHC: 32.5 g/dL (ref 30.0–36.0)
MCHC: 32.3 g/dL (ref 30.0–36.0)
MCHC: 33 g/dL (ref 30.0–36.0)
MCV: 97.8 fL (ref 78.0–100.0)
MCV: 98.3 fL (ref 78.0–100.0)
MCV: 97.8 fL (ref 78.0–100.0)
Platelets: 134 10*3/uL — ABNORMAL LOW (ref 150–400)
Platelets: 118 10*3/uL — ABNORMAL LOW (ref 150–400)
Platelets: 113 10*3/uL — ABNORMAL LOW (ref 150–400)
RBC: 3.24 MIL/uL — ABNORMAL LOW (ref 4.22–5.81)
RBC: 2.93 MIL/uL — ABNORMAL LOW (ref 4.22–5.81)
RBC: 2.73 MIL/uL — ABNORMAL LOW (ref 4.22–5.81)
RDW: 15 % (ref 11.5–15.5)
RDW: 15 % (ref 11.5–15.5)
RDW: 15 % (ref 11.5–15.5)
WBC: 3.8 10*3/uL — ABNORMAL LOW (ref 4.0–10.5)
WBC: 3.7 10*3/uL — ABNORMAL LOW (ref 4.0–10.5)
WBC: 3.5 10*3/uL — ABNORMAL LOW (ref 4.0–10.5)

## 2013-09-13 LAB — BASIC METABOLIC PANEL
BUN: 17 mg/dL (ref 6–23)
CO2: 24 mEq/L (ref 19–32)
Calcium: 8.1 mg/dL — ABNORMAL LOW (ref 8.4–10.5)
Chloride: 109 mEq/L (ref 96–112)
Creatinine, Ser: 1.11 mg/dL (ref 0.50–1.35)
GFR calc Af Amer: 73 mL/min — ABNORMAL LOW (ref >90)
GFR calc non Af Amer: 63 mL/min — ABNORMAL LOW (ref >90)
Glucose, Bld: 93 mg/dL (ref 70–99)
Potassium: 5.2 mEq/L — ABNORMAL HIGH (ref 3.5–5.1)
Sodium: 140 mEq/L (ref 135–145)

## 2013-09-13 LAB — APTT: aPTT: 43 seconds — ABNORMAL HIGH (ref 24–37)

## 2013-09-13 LAB — GLUCOSE, CAPILLARY
Glucose-Capillary: 91 mg/dL (ref 70–99)
Glucose-Capillary: 93 mg/dL (ref 70–99)
Glucose-Capillary: 113 mg/dL — ABNORMAL HIGH (ref 70–99)
Glucose-Capillary: 114 mg/dL — ABNORMAL HIGH (ref 70–99)
Glucose-Capillary: 113 mg/dL — ABNORMAL HIGH (ref 70–99)

## 2013-09-13 LAB — PROTIME-INR
INR: 2.08 — ABNORMAL HIGH (ref 0.00–1.49)
Prothrombin Time: 22.7 seconds — ABNORMAL HIGH (ref 11.6–15.2)

## 2013-09-13 MED ORDER — SODIUM CHLORIDE 0.9 % IV SOLN
INTRAVENOUS | Status: AC
  Administered 2013-09-13: 60 mL/h via INTRAVENOUS

## 2013-09-13 NOTE — Progress Notes (Signed)
Chaplain stopped by to visit with pt.  Pt mentioned that his wife put in the request to see a chaplain and she was not present.  Pt requested that a chaplain follow up in the next day or two with him.  Chaplain agreed.

## 2013-09-13 NOTE — Progress Notes (Signed)
Subjective: Still with hematochezia, but he reports that the bleeding is slowing down.  Objective: Vital signs in last 24 hours: Temp:  [97.4 F (36.3 C)-98 F (36.7 C)] 97.4 F (36.3 C) (12/24 0519) Pulse Rate:  [43-133] 71 (12/24 0744) Resp:  [15-22] 19 (12/24 0519) BP: (101-129)/(61-89) 108/73 mmHg (12/24 0519) SpO2:  [95 %-99 %] 99 % (12/24 0519) Weight:  [194 lb 14.2 oz (88.4 kg)-199 lb 9 oz (90.521 kg)] 194 lb 14.2 oz (88.4 kg) (12/24 0519) Last BM Date: 09/12/13  Intake/Output from previous day: 12/23 0701 - 12/24 0700 In: 250 [P.O.:240; I.V.:10] Out: -  Intake/Output this shift:    General appearance: alert and no distress GI: soft, non-tender; bowel sounds normal; no masses,  no organomegaly  Lab Results:  Recent Labs  09/12/13 1143 09/12/13 1710 09/12/13 2235  WBC 5.3 4.4 4.1  HGB 13.3 11.8* 11.1*  HCT 41.2 36.4* 34.0*  PLT 141* 122* 127*   BMET  Recent Labs  09/12/13 1143  NA 139  K 5.1  CL 105  CO2 25  GLUCOSE 96  BUN 17  CREATININE 1.11  CALCIUM 8.8   LFT  Recent Labs  09/12/13 1143  PROT 6.2  ALBUMIN 3.6  AST 20  ALT 16  ALKPHOS 67  BILITOT 0.6   PT/INR  Recent Labs  09/12/13 1143  LABPROT 23.0*  INR 2.11*   Hepatitis Panel No results found for this basename: HEPBSAG, HCVAB, HEPAIGM, HEPBIGM,  in the last 72 hours C-Diff No results found for this basename: CDIFFTOX,  in the last 72 hours Fecal Lactopherrin No results found for this basename: FECLLACTOFRN,  in the last 72 hours  Studies/Results: No results found.  Medications:  Scheduled: . allopurinol  300 mg Oral Daily  . carvedilol  12.5 mg Oral BID WC  . cycloSPORINE  1 drop Both Eyes BID  . insulin aspart  0-9 Units Subcutaneous TID WC  . pantoprazole (PROTONIX) IV  40 mg Intravenous Q12H  . simvastatin  20 mg Oral q1800  . sodium chloride  3 mL Intravenous Q12H   Continuous: . sodium chloride 50 mL/hr at 09/12/13 1617    Assessment/Plan: 1)  Diverticular bleed.   Minimal drop in his HGB, but I anticipate that it will drop some more.  HGB is at 11.1 g/dL.  Plan: 1) Continue to monitor HGB. 2) Transfuse if necessary.   LOS: 1 day   Elverna Caffee D 09/13/2013, 8:33 AM

## 2013-09-13 NOTE — Evaluation (Signed)
Occupational Therapy Evaluation Patient Details Name: Benjamin Soto MRN: [id number] DOB: 14-Apr-1936 Today's Date: 09/13/2013 Time: 4132-4401 OT Time Calculation (min): 20 min  OT Assessment / Plan / Recommendation History of present illness GIB   Clinical Impression   Pt presents with good overall mobility and ability to perform ADL.  Pt with reported "woozy" feeling with ambulation earlier today to bathroom (RN aware), therefore deemed at a supervision level for safety for OOB. Will have assist of his wife at home.  No further OT needs.    OT Assessment  Patient does not need any further OT services    Follow Up Recommendations  No OT follow up    Barriers to Discharge      Equipment Recommendations  None recommended by OT    Recommendations for Other Services    Frequency       Precautions / Restrictions Precautions Precautions: None Restrictions Weight Bearing Restrictions: No   Pertinent Vitals/Pain Denies pain    ADL  Eating/Feeding: Independent Where Assessed - Eating/Feeding: Edge of bed Grooming: Wash/dry hands;Supervision/safety Where Assessed - Grooming: Unsupported standing Upper Body Bathing: Supervision/safety Where Assessed - Upper Body Bathing: Unsupported standing Lower Body Bathing: Supervision/safety Where Assessed - Lower Body Bathing: Unsupported standing;Supported standing Upper Body Dressing: Independent Where Assessed - Upper Body Dressing: Unsupported sitting Lower Body Dressing: Supervision/safety Where Assessed - Lower Body Dressing: Unsupported sitting;Supported sit to stand Toilet Transfer: Supervision/safety Toilet Transfer Method: Sit to Barista: Regular height toilet Toileting - Clothing Manipulation and Hygiene: Independent Where Assessed - Glass blower/designer Manipulation and Hygiene: Sit on 3-in-1 or toilet Transfers/Ambulation Related to ADLs: supervision holding IV pole, pt with reported "woozy" feeling  earlier today    OT Diagnosis:    OT Problem List:   OT Treatment Interventions:     OT Goals(Current goals can be found in the care plan section) Acute Rehab OT Goals Patient Stated Goal: return home asap  Visit Information  Last OT Received On: 09/13/13 Assistance Needed: +1 History of Present Illness: GIB       Prior Functioning     Home Living Family/patient expects to be discharged to:: Private residence Living Arrangements: Spouse/significant other Available Help at Discharge: Family;Available 24 hours/day Type of Home: House Home Access: Stairs to enter [employer] of Steps: 2 Home Layout: One level Home Equipment: None Prior Function Level of Independence: Independent Communication Communication: HOH Dominant Hand: Right         Vision/Perception Vision - History Baseline Vision: Wears glasses only for reading   Cognition  Cognition Arousal/Alertness: Awake/alert Behavior During Therapy: WFL for tasks assessed/performed Overall Cognitive Status: Within Functional Limits for tasks assessed    Extremity/Trunk Assessment Upper Extremity Assessment Upper Extremity Assessment: Overall WFL for tasks assessed Lower Extremity Assessment Lower Extremity Assessment: Defer to PT evaluation Cervical / Trunk Assessment Cervical / Trunk Assessment: Normal     Mobility Bed Mobility Bed Mobility: Supine to Sit;Sit to Supine Supine to Sit: 7: Independent Sit to Supine: 7: Independent Transfers Transfers: Sit to Stand;Stand to Sit Sit to Stand: 6: Modified independent (Device/Increase time) Stand to Sit: 6: Modified independent (Device/Increase time) Details for Transfer Assistance: no assist     Exercise     Balance     End of Session OT - End of Session Activity Tolerance: Patient tolerated treatment well Patient left: in bed;with call bell/phone within reach;with nursing/sitter in room Nurse Communication: Mobility status  GO      Evern Bio 09/13/2013,  10:16 AM [phone]

## 2013-09-13 NOTE — Evaluation (Signed)
Physical Therapy Evaluation Patient Details Name: Benjamin Soto MRN: [id number] DOB: 03/24/1936 Today's Date: 09/13/2013 Time: 0720-0742 PT Time Calculation (min): 22 min  PT Assessment / Plan / Recommendation History of Present Illness  GIB  Clinical Impression  Pt very pleasant and mobilizing well. Pt does report 2 falls in the last year with falls tripping while carrying objects. Pt with great transfers and ambulation in long hall. Pt without noticeable fall risk and at baseline functional level. Pt with 2 bloody BMs during session with urgent need to void and some leakage. Recommend continued hall ambulation acutely to maintain strength and endurance but no further therapy needs at this time, pt aware and agreeable.     PT Assessment  Patent does not need any further PT services    Follow Up Recommendations  No PT follow up    Does the patient have the potential to tolerate intense rehabilitation      Barriers to Discharge        Equipment Recommendations  None recommended by PT    Recommendations for Other Services     Frequency      Precautions / Restrictions Precautions Precautions: None   Pertinent Vitals/Pain No pain, HR 71      Mobility  Bed Mobility Bed Mobility: Supine to Sit Supine to Sit: 7: Independent Transfers Transfers: Sit to Stand;Stand to Sit Sit to Stand: 6: Modified independent (Device/Increase time);From bed;From toilet Stand to Sit: 6: Modified independent (Device/Increase time);To chair/3-in-1;To toilet;To bed Details for Transfer Assistance: x4 trials from various surfaces no cueing or assist Ambulation/Gait Ambulation/Gait Assistance: 7: Independent Ambulation Distance (Feet): 400 Feet Assistive device: None Ambulation/Gait Assistance Details: pt pushing IV pole half of ambulation and nothing the rest Gait Pattern: Within Functional Limits Gait velocity: WFL Stairs: Yes Stairs Assistance: 7: Independent Stair Management Technique:  No rails;Forwards;Alternating pattern Number of Stairs: 2    Exercises     PT Diagnosis:    PT Problem List:   PT Treatment Interventions:       PT Goals(Current goals can be found in the care plan section) Acute Rehab PT Goals PT Goal Formulation: No goals set, d/c therapy  Visit Information  Last PT Received On: 09/13/13 Assistance Needed: +1 History of Present Illness: GIB       Prior Functioning  Home Living Family/patient expects to be discharged to:: Private residence Living Arrangements: Spouse/significant other Available Help at Discharge: Family;Available 24 hours/day Type of Home: House Home Access: Stairs to enter [employer] of Steps: 2 Home Layout: One level Home Equipment: None Prior Function Level of Independence: Independent Communication Communication: HOH    Cognition  Cognition Arousal/Alertness: Awake/alert Behavior During Therapy: WFL for tasks assessed/performed Overall Cognitive Status: Within Functional Limits for tasks assessed    Extremity/Trunk Assessment Upper Extremity Assessment Upper Extremity Assessment: Overall WFL for tasks assessed Lower Extremity Assessment Lower Extremity Assessment: Overall WFL for tasks assessed Cervical / Trunk Assessment Cervical / Trunk Assessment: Normal   Balance    End of Session PT - End of Session Activity Tolerance: Patient tolerated treatment well Patient left: in chair;with call bell/phone within reach;with family/visitor present Nurse Communication: Mobility status  GP Functional Assessment Tool Used: clinical judgement Functional Limitation: Mobility: Walking and moving around Mobility: Walking and Moving Around Current Status (W0981): 0 percent impaired, limited or restricted Mobility: Walking and Moving Around Goal Status (X9147): 0 percent impaired, limited or restricted Mobility: Walking and Moving Around Discharge Status [phone]): 0 percent impaired, limited or restricted  [name] 09/13/2013, 7:47 AM [name], PT [phone]

## 2013-09-13 NOTE — Progress Notes (Signed)
[facility] PROGRESS NOTE    Benjamin Soto UJW:[id number] DOB: 12-31-1935 DOA: 09/12/2013 PCP: Crawford Givens, MD  HPI/Brief narrative 77 year old male with history of atrial fibrillation on chronic Coumadin therapy, type II DM, HL, colonic polyps, diverticulosis by colonoscopy, admitted on 09/12/13 with several episodes of bright red bleeding per rectum. He denied nausea, vomiting, dizziness or lightheadedness. She was admitted for management and evaluation of GI bleed.   Assessment/Plan:  Lower GI bleed/diverticular bleed - Patient was admitted to the hospital. He was placed on liquid diet and GI was consulted. - GI states that patient's presentation is consistent with diverticular bleed. In diverticular were confirmed by colonoscopy in 2009. Dr. Elnoria Howard does not plan colonoscopy at this time. - Rectal bleeding seems to be decreasing. Managed conservatively and transfuse if needed.  Acute blood loss anemia/thrombocytopenia - Secondary to GI bleed. Approximately 4 g drop in hemoglobin since admission. Monitor CBCs closely and transfuse if hemoglobin is less than 8 g per DL or if become significantly symptomatic.  Type II DM - Continue SSI.  A. Fib -Mostly with controlled ventricular rate. Coumadin held. INR is still therapeutic. Reverse if GI bleed worsens. Continue Coreg  Chronic systolic CHF/CAD/dilated cardiomyopathy - Stable. Continue Coreg, Zocor and Lasix currently held.  Hypertension - Stable. Continue Coreg    Code Status: Full Family Communication: None at bedside Disposition Plan: Home when medically stable   Consultants:  GI  Procedures:  None  Antibiotics:  None   Subjective:  patient states that his rectal bleeding is definitely decreasing-decreased frequency and significantly reduced amounts. During last BM this morning, barely noticed any blood. Denies abdominal pain, nausea or vomiting. Dizziness times one while ambulating with  PT.  Objective: Filed Vitals:   09/13/13 0519 09/13/13 0744 09/13/13 1007 09/13/13 1330  BP: 108/73  115/58 123/72  Pulse: 83 71 105 53  Temp: 97.4 F (36.3 C)   97.5 F (36.4 C)  TempSrc: Oral   Oral  Resp: 19  18 18   Height:      Weight: 88.4 kg (194 lb 14.2 oz)     SpO2: 99%  100% 98%    Intake/Output Summary (Last 24 hours) at 09/13/13 1554 Last data filed at 09/13/13 1300  Gross per 24 hour  Intake 1115.83 ml  Output      3 ml  Net 1112.83 ml   Filed Weights   09/12/13 1044 09/12/13 1546 09/13/13 0519  Weight: 90.521 kg (199 lb 9 oz) 90.2 kg (198 lb 13.7 oz) 88.4 kg (194 lb 14.2 oz)     Exam:  General exam: Pleasant elderly male lying comfortably supine in bed. Respiratory system: Clear. No increased work of breathing. Cardiovascular system: S1 & S2 heard, irregularly irregular. No JVD, murmurs, gallops, clicks or pedal edema. Telemetry: Atrial flutter with mostly controlled ventricular rate. Gastrointestinal system: Abdomen is nondistended, soft and nontender. Normal bowel sounds heard. Central nervous system: Alert and oriented. No focal neurological deficits. Extremities: Symmetric 5 x 5 power.   Data Reviewed: Basic Metabolic Panel:  Recent Labs Lab 09/12/13 1143 09/13/13 0705  NA 139 140  K 5.1 5.2*  CL 105 109  CO2 25 24  GLUCOSE 96 93  BUN 17 17  CREATININE 1.11 1.11  CALCIUM 8.8 8.1*   Liver Function Tests:  Recent Labs Lab 09/12/13 1143  AST 20  ALT 16  ALKPHOS 67  BILITOT 0.6  PROT 6.2  ALBUMIN 3.6   No results found for this basename: LIPASE, AMYLASE,  in  the last 168 hours No results found for this basename: AMMONIA,  in the last 168 hours CBC:  Recent Labs Lab 09/12/13 1143 09/12/13 1710 09/12/13 2235 09/13/13 0705 09/13/13 1420  WBC 5.3 4.4 4.1 3.8* 3.7*  HGB 13.3 11.8* 11.1* 10.3* 9.3*  HCT 41.2 36.4* 34.0* 31.7* 28.8*  MCV 99.3 98.6 98.8 97.8 98.3  PLT 141* 122* 127* 134* 118*   Cardiac Enzymes: No results  found for this basename: CKTOTAL, CKMB, CKMBINDEX, TROPONINI,  in the last 168 hours BNP (last 3 results) No results found for this basename: PROBNP,  in the last 8760 hours CBG:  Recent Labs Lab 09/12/13 1937 09/13/13 0038 09/13/13 0517 09/13/13 0746 09/13/13 1058  GLUCAP 143* 91 93 113* 114*    No results found for this or any previous visit (from the past 240 hour(s)).    Additional labs: 1. INR: 2.08 on 12/24 2. Hemoglobin A1c: 5.7     Studies: No results found.      Scheduled Meds: . allopurinol  300 mg Oral Daily  . carvedilol  12.5 mg Oral BID WC  . cycloSPORINE  1 drop Both Eyes BID  . insulin aspart  0-9 Units Subcutaneous TID WC  . pantoprazole (PROTONIX) IV  40 mg Intravenous Q12H  . simvastatin  20 mg Oral q1800  . sodium chloride  3 mL Intravenous Q12H   Continuous Infusions:   Principal Problem:   GI bleed Active Problems:   DIABETES MELLITUS, TYPE II   HYPERLIPIDEMIA   Essential hypertension, benign   Atrial fibrillation   Diverticulosis of colon   Systolic CHF, chronic   CAD (coronary artery disease)   Congestive dilated cardiomyopathy   GIB (gastrointestinal bleeding)    Time spent: 45 minutes.    Marcellus Scott, MD, FACP, FHM. [facility] Pager [phone]  If 7PM-7AM, please contact night-coverage [email or web address] Password [facility] 09/13/2013, 3:54 PM    LOS: 1 day

## 2013-09-13 NOTE — Progress Notes (Signed)
09/12/13 1900-0700 shift. Pt.is A/Ox4 and had no c/o pain or signs of distress during the shift. He is on room air and ambulates independently. He still had some episodes of bloody stools during the shift. CBC being drawn q8hrs. Wife remained at bedside throughout the shift.

## 2013-09-14 DIAGNOSIS — K5730 Diverticulosis of large intestine without perforation or abscess without bleeding: Secondary | ICD-10-CM

## 2013-09-14 LAB — GLUCOSE, CAPILLARY
Glucose-Capillary: 108 mg/dL — ABNORMAL HIGH (ref 70–99)
Glucose-Capillary: 112 mg/dL — ABNORMAL HIGH (ref 70–99)
Glucose-Capillary: 125 mg/dL — ABNORMAL HIGH (ref 70–99)
Glucose-Capillary: 140 mg/dL — ABNORMAL HIGH (ref 70–99)
Glucose-Capillary: 143 mg/dL — ABNORMAL HIGH (ref 70–99)
Glucose-Capillary: 143 mg/dL — ABNORMAL HIGH (ref 70–99)
Glucose-Capillary: 98 mg/dL (ref 70–99)

## 2013-09-14 LAB — CBC
HCT: 26.4 % — ABNORMAL LOW (ref 39.0–52.0)
HCT: 24.1 % — ABNORMAL LOW (ref 39.0–52.0)
Hemoglobin: 8.6 g/dL — ABNORMAL LOW (ref 13.0–17.0)
Hemoglobin: 7.8 g/dL — ABNORMAL LOW (ref 13.0–17.0)
MCH: 32.1 pg (ref 26.0–34.0)
MCH: 32.1 pg (ref 26.0–34.0)
MCHC: 32.6 g/dL (ref 30.0–36.0)
MCHC: 32.4 g/dL (ref 30.0–36.0)
MCV: 98.5 fL (ref 78.0–100.0)
MCV: 99.2 fL (ref 78.0–100.0)
Platelets: 115 10*3/uL — ABNORMAL LOW (ref 150–400)
Platelets: 113 10*3/uL — ABNORMAL LOW (ref 150–400)
RBC: 2.68 MIL/uL — ABNORMAL LOW (ref 4.22–5.81)
RBC: 2.43 MIL/uL — ABNORMAL LOW (ref 4.22–5.81)
RDW: 15.1 % (ref 11.5–15.5)
RDW: 15.2 % (ref 11.5–15.5)
WBC: 3.5 10*3/uL — ABNORMAL LOW (ref 4.0–10.5)
WBC: 3.3 10*3/uL — ABNORMAL LOW (ref 4.0–10.5)

## 2013-09-14 LAB — PROTIME-INR
INR: 2.15 — ABNORMAL HIGH (ref 0.00–1.49)
Prothrombin Time: 23.3 seconds — ABNORMAL HIGH (ref 11.6–15.2)

## 2013-09-14 LAB — BASIC METABOLIC PANEL
BUN: 15 mg/dL (ref 6–23)
CO2: 21 mEq/L (ref 19–32)
Calcium: 7.6 mg/dL — ABNORMAL LOW (ref 8.4–10.5)
Chloride: 111 mEq/L (ref 96–112)
Creatinine, Ser: 1.07 mg/dL (ref 0.50–1.35)
GFR calc Af Amer: 76 mL/min — ABNORMAL LOW (ref >90)
GFR calc non Af Amer: 66 mL/min — ABNORMAL LOW (ref >90)
Glucose, Bld: 104 mg/dL — ABNORMAL HIGH (ref 70–99)
Potassium: 4.1 mEq/L (ref 3.5–5.1)
Sodium: 142 mEq/L (ref 135–145)

## 2013-09-14 LAB — PREPARE RBC (CROSSMATCH)

## 2013-09-14 MED ORDER — SODIUM CHLORIDE 0.9 % IV SOLN
INTRAVENOUS | Status: AC
  Administered 2013-09-14: 15:00:00 via INTRAVENOUS

## 2013-09-14 MED ORDER — FUROSEMIDE 10 MG/ML IJ SOLN
40.0000 mg | Freq: Once | INTRAMUSCULAR | Status: AC
  Administered 2013-09-14: 40 mg via INTRAVENOUS
  Filled 2013-09-14: qty 4

## 2013-09-14 NOTE — Progress Notes (Signed)
Repeat Hb 7.8. Will transfuse a unit of FFP to reverse INR & 2 units of PRBCs. Follow posttransfusion CBC and INR.  Marcellus Scott, MD, FACP, FHM. [facility] Pager [phone]  If 7PM-7AM, please contact night-coverage [email or web address] Password [facility] 09/14/2013, 7:23 PM

## 2013-09-14 NOTE — Progress Notes (Signed)
Hemoglobin is 7.8; spoke with MD and orders given, reported off to night nurse.  Lorretta Harp RN

## 2013-09-14 NOTE — Progress Notes (Signed)
          Daily Rounding Note  09/14/2013, 11:59 AM  LOS: 2 days   SUBJECTIVE:       Pt gives some conflicting hx but wife says no BMs yesterday or overnight.  Dark stool with leaching of blood in commode once this AM Still with therapeutic INR though coumadin is on hold.   OBJECTIVE:         Vital signs in last 24 hours:    Temp:  [97.5 F (36.4 C)-97.9 F (36.6 C)] 97.5 F (36.4 C) (12/25 0824) Pulse Rate:  [53-74] 70 (12/25 0824) Resp:  [18] 18 (12/25 0824) BP: (96-123)/(52-72) 101/52 mmHg (12/25 0824) SpO2:  [97 %-99 %] 97 % (12/25 0824) Weight:  [90.13 kg (198 lb 11.2 oz)] 90.13 kg (198 lb 11.2 oz) (12/25 0705) Last BM Date: 09/14/13 General: looks unwell, pale   Heart: RRR Chest: clear Abdomen: soft, NT, ND  Extremities: no CCE Neuro/Psych:  Oriented x 3, scattered history.   Intake/Output from previous day: 12/24 0701 - 12/25 0700 In: 1485 [P.O.:610; I.V.:875] Out: 4 [Urine:3; Stool:1]  Intake/Output this shift: Total I/O In: 3 [I.V.:3] Out: 300 [Urine:300]  Lab Results:  Recent Labs  09/13/13 1420 09/13/13 2200 09/14/13 0450  WBC 3.7* 3.5* 3.5*  HGB 9.3* 8.8* 8.6*  HCT 28.8* 26.7* 26.4*  PLT 118* 113* 115*   BMET  Recent Labs  09/12/13 1143 09/13/13 0705 09/14/13 0450  NA 139 140 142  K 5.1 5.2* 4.1  CL 105 109 111  CO2 25 24 21   GLUCOSE 96 93 104*  BUN 17 17 15   CREATININE 1.11 1.11 1.07  CALCIUM 8.8 8.1* 7.6*   LFT  Recent Labs  09/12/13 1143  PROT 6.2  ALBUMIN 3.6  AST 20  ALT 16  ALKPHOS 67  BILITOT 0.6   PT/INR  Recent Labs  09/13/13 0705 09/14/13 0450  LABPROT 22.7* 23.3*  INR 2.08* 2.15*    ASSESMENT:   *  Lower GIB.  Presumed diverticular bleed, which appears to be resolving.  Last colonoscopy was 2009 in [place], descending and sigmoid tics.  Hx of colon polyps On BID UV Protonix.   *  Hx A fib, chronic Coumadin on hold.  Max INR 2.15, today.  Has not  received vitamin K.     PLAN   *  Stop Protonix, this is lower not upper GIB *  Per Dr Elnoria Howard, no plans for colonoscopy. He will return tomorrow.  *  Advance diet.     Jennye Moccasin  09/14/2013, 11:59 AM Pager: [phone]  GI Attending Note  I have personally taken an interval history, reviewed the chart, and examined the patient.  I agree with the extender's note, impression and recommendations.  Barbette Hair. Arlyce Dice, MD, [facility] [phone]

## 2013-09-14 NOTE — Progress Notes (Signed)
[facility] PROGRESS NOTE    Paulanthony Gleaves ZOX:[id number] DOB: [birth year] DOA: 09/12/2013 PCP: Crawford Givens, MD  HPI/Brief narrative 77 year old male with history of atrial fibrillation on chronic Coumadin therapy, type II DM, HL, colonic polyps, diverticulosis by colonoscopy, admitted on 09/12/13 with several episodes of bright red bleeding per rectum. He denied nausea, vomiting, dizziness or lightheadedness. She was admitted for management and evaluation of GI bleed.   Assessment/Plan:  Lower GI bleed/diverticular bleed - Patient was admitted to the hospital. He was placed on liquid diet and GI was consulted. - GI states that patient's presentation is consistent with diverticular bleed. Diverticulae were confirmed by colonoscopy in 2009. Dr. Elnoria Howard does not plan colonoscopy at this time. - Rectal bleeding seems to be decreasing. Managed conservatively and transfuse if needed. - Patient states that in the last 24 hours, his rectal bleeding had significantly diminished and even stopped when he had his first BM at 6 this morning. He subsequently had another BM with some blood in it. - Hemoglobin stable over the last 8 hours. However he has dropped hemoglobin from 13.3 > 8.6 since admission. - Coumadin held since admission but INR has not been reversed. - Await GI follow up today regarding further plans-? Continued conservative management versus colonoscopy, need to reverse INR with FFP or vitamin K.  Acute blood loss anemia/pancytopenia - Secondary to GI bleed. Approximately 4.5 g drop in hemoglobin since admission. Monitor CBCs closely and transfuse if hemoglobin is less than 8 g per DL or if become significantly symptomatic. Currently relatively asymptomatic.  Type II DM - Continue SSI.  A. Fib -Mostly with controlled ventricular rate. Coumadin held. INR is still therapeutic. Reverse if GI bleed worsens. Continue Coreg  Chronic systolic CHF/CAD/dilated cardiomyopathy - Stable.  Continue Coreg, Zocor and Lasix currently held. Clinically euvolemic.  Hypertension - Stable/soft blood pressure. Continue Coreg    Code Status: Full Family Communication: None at bedside Disposition Plan: Home when medically stable   Consultants:  GI  Procedures:  None  Antibiotics:  None   Subjective: Patient states that in the last 24 hours, his rectal bleeding had significantly diminished and even stopped when he had his first BM at 6 this morning. He subsequently had another BM with some blood in it. Denies dizziness, lightheadedness, chest pain or dyspnea.  Objective: Filed Vitals:   09/13/13 2009 09/14/13 0617 09/14/13 0705 09/14/13 0824  BP: 96/60 101/61  101/52  Pulse: 68 74  70  Temp: 97.6 F (36.4 C) 97.9 F (36.6 C)  97.5 F (36.4 C)  TempSrc: Oral Oral  Oral  Resp: 18 18  18   Height:      Weight:   90.13 kg (198 lb 11.2 oz)   SpO2: 99% 98%  97%    Intake/Output Summary (Last 24 hours) at 09/14/13 1211 Last data filed at 09/14/13 0958  Gross per 24 hour  Intake   1478 ml  Output    303 ml  Net   1175 ml   Filed Weights   09/12/13 1546 09/13/13 0519 09/14/13 0705  Weight: 90.2 kg (198 lb 13.7 oz) 88.4 kg (194 lb 14.2 oz) 90.13 kg (198 lb 11.2 oz)     Exam:  General exam: Pleasant elderly male lying comfortably supine in bed. Respiratory system: Clear. No increased work of breathing. Cardiovascular system: S1 & S2 heard, irregularly irregular. No JVD, murmurs, gallops, clicks or pedal edema. Telemetry: Atrial flutter/ with mostly controlled ventricular rate in the 80s. Gastrointestinal system: Abdomen is  nondistended, soft and nontender. Normal bowel sounds heard. Central nervous system: Alert and oriented. No focal neurological deficits. Extremities: Symmetric 5 x 5 power.   Data Reviewed: Basic Metabolic Panel:  Recent Labs Lab 09/12/13 1143 09/13/13 0705 09/14/13 0450  NA 139 140 142  K 5.1 5.2* 4.1  CL 105 109 111  CO2 25 24  21   GLUCOSE 96 93 104*  BUN 17 17 15   CREATININE 1.11 1.11 1.07  CALCIUM 8.8 8.1* 7.6*   Liver Function Tests:  Recent Labs Lab 09/12/13 1143  AST 20  ALT 16  ALKPHOS 67  BILITOT 0.6  PROT 6.2  ALBUMIN 3.6   No results found for this basename: LIPASE, AMYLASE,  in the last 168 hours No results found for this basename: AMMONIA,  in the last 168 hours CBC:  Recent Labs Lab 09/12/13 2235 09/13/13 0705 09/13/13 1420 09/13/13 2200 09/14/13 0450  WBC 4.1 3.8* 3.7* 3.5* 3.5*  HGB 11.1* 10.3* 9.3* 8.8* 8.6*  HCT 34.0* 31.7* 28.8* 26.7* 26.4*  MCV 98.8 97.8 98.3 97.8 98.5  PLT 127* 134* 118* 113* 115*   Cardiac Enzymes: No results found for this basename: CKTOTAL, CKMB, CKMBINDEX, TROPONINI,  in the last 168 hours BNP (last 3 results) No results found for this basename: PROBNP,  in the last 8760 hours CBG:  Recent Labs Lab 09/13/13 2006 09/14/13 0008 09/14/13 0447 09/14/13 0802 09/14/13 1059  GLUCAP 143* 125* 98 108* 140*    Recent Results (from the past 240 hour(s))  URINE CULTURE     Status: None   Collection Time    09/12/13  5:55 PM      Result Value Range Status   Specimen Description URINE, RANDOM   Final   Special Requests NONE   Final   Culture  Setup Time     Final   Value: 09/12/2013 20:12     Performed at [facility] Count PENDING   Incomplete   Culture     Final   Value: Culture reincubated for better growth     Performed at [facility]   Report Status PENDING   Incomplete      Additional labs: 1. INR: 2.15 on 12/24 2. Hemoglobin A1c: 5.7     Studies: No results found.      Scheduled Meds: . allopurinol  300 mg Oral Daily  . carvedilol  12.5 mg Oral BID WC  . cycloSPORINE  1 drop Both Eyes BID  . insulin aspart  0-9 Units Subcutaneous TID WC  . simvastatin  20 mg Oral q1800  . sodium chloride  3 mL Intravenous Q12H   Continuous Infusions:    Principal Problem:   GI bleed Active Problems:    DIABETES MELLITUS, TYPE II   HYPERLIPIDEMIA   Essential hypertension, benign   Atrial fibrillation   Diverticulosis of colon   Systolic CHF, chronic   CAD (coronary artery disease)   Congestive dilated cardiomyopathy   GIB (gastrointestinal bleeding)   Acute post-hemorrhagic anemia   Thrombocytopenia, unspecified    Time spent: 25 minutes.    Marcellus Scott, MD, FACP, FHM. [facility] Pager [phone]  If 7PM-7AM, please contact night-coverage [email or web address] Password [email or web address] 09/14/2013, 12:11 PM    LOS: 2 days

## 2013-09-15 LAB — GLUCOSE, CAPILLARY
Glucose-Capillary: 111 mg/dL — ABNORMAL HIGH (ref 70–99)
Glucose-Capillary: 127 mg/dL — ABNORMAL HIGH (ref 70–99)
Glucose-Capillary: 104 mg/dL — ABNORMAL HIGH (ref 70–99)

## 2013-09-15 LAB — URINE CULTURE

## 2013-09-15 LAB — CBC
HCT: 30 % — ABNORMAL LOW (ref 39.0–52.0)
HCT: 28.6 % — ABNORMAL LOW (ref 39.0–52.0)
Hemoglobin: 9.9 g/dL — ABNORMAL LOW (ref 13.0–17.0)
Hemoglobin: 9.6 g/dL — ABNORMAL LOW (ref 13.0–17.0)
MCH: 31.7 pg (ref 26.0–34.0)
MCH: 32 pg (ref 26.0–34.0)
MCHC: 33 g/dL (ref 30.0–36.0)
MCHC: 33.6 g/dL (ref 30.0–36.0)
MCV: 96.2 fL (ref 78.0–100.0)
MCV: 95.3 fL (ref 78.0–100.0)
Platelets: 108 10*3/uL — ABNORMAL LOW (ref 150–400)
Platelets: 111 10*3/uL — ABNORMAL LOW (ref 150–400)
RBC: 3.12 MIL/uL — ABNORMAL LOW (ref 4.22–5.81)
RBC: 3 MIL/uL — ABNORMAL LOW (ref 4.22–5.81)
RDW: 17 % — ABNORMAL HIGH (ref 11.5–15.5)
RDW: 17.2 % — ABNORMAL HIGH (ref 11.5–15.5)
WBC: 4.1 10*3/uL (ref 4.0–10.5)
WBC: 4 10*3/uL (ref 4.0–10.5)

## 2013-09-15 LAB — PROTIME-INR
INR: 1.67 — ABNORMAL HIGH (ref 0.00–1.49)
Prothrombin Time: 19.2 seconds — ABNORMAL HIGH (ref 11.6–15.2)

## 2013-09-15 NOTE — Progress Notes (Signed)
0842m Seen by Dr. Wynona Luna . Aware of Moderate  Bloody BM today . Also aware of Hgb/ Hct level today

## 2013-09-15 NOTE — Progress Notes (Signed)
Subjective: Feeling well.  No complaints.  Objective: Vital signs in last 24 hours: Temp:  [97.3 F (36.3 C)-98.8 F (37.1 C)] 97.5 F (36.4 C) (12/26 0500) Pulse Rate:  [67-130] 82 (12/26 0500) Resp:  [18-20] 18 (12/26 0500) BP: (90-124)/(49-88) 124/69 mmHg (12/26 0500) SpO2:  [97 %-100 %] 98 % (12/26 0500) Weight:  [194 lb 3.6 oz (88.1 kg)] 194 lb 3.6 oz (88.1 kg) (12/26 0500) Last BM Date: 09/14/13  Intake/Output from previous day: 12/25 0701 - 12/26 0700 In: 3043.5 [P.O.:1440; I.V.:676; Blood:927.5] Out: 2380 [Urine:2380] Intake/Output this shift:    General appearance: alert and no distress GI: soft, non-tender; bowel sounds normal; no masses,  no organomegaly  Lab Results:  Recent Labs  09/14/13 0450 09/14/13 1706 09/15/13 0600  WBC 3.5* 3.3* 4.1  HGB 8.6* 7.8* 9.9*  HCT 26.4* 24.1* 30.0*  PLT 115* 113* 108*   BMET  Recent Labs  09/12/13 1143 09/13/13 0705 09/14/13 0450  NA 139 140 142  K 5.1 5.2* 4.1  CL 105 109 111  CO2 25 24 21   GLUCOSE 96 93 104*  BUN 17 17 15   CREATININE 1.11 1.11 1.07  CALCIUM 8.8 8.1* 7.6*   LFT  Recent Labs  09/12/13 1143  PROT 6.2  ALBUMIN 3.6  AST 20  ALT 16  ALKPHOS 67  BILITOT 0.6   PT/INR  Recent Labs  09/14/13 0450 09/15/13 0600  LABPROT 23.3* 19.2*  INR 2.15* 1.67*   Hepatitis Panel No results found for this basename: HEPBSAG, HCVAB, HEPAIGM, HEPBIGM,  in the last 72 hours C-Diff No results found for this basename: CDIFFTOX,  in the last 72 hours Fecal Lactopherrin No results found for this basename: FECLLACTOFRN,  in the last 72 hours  Studies/Results: No results found.  Medications:  Scheduled: . allopurinol  300 mg Oral Daily  . carvedilol  12.5 mg Oral BID WC  . cycloSPORINE  1 drop Both Eyes BID  . insulin aspart  0-9 Units Subcutaneous TID WC  . simvastatin  20 mg Oral q1800  . sodium chloride  3 mL Intravenous Q12H   Continuous: . sodium chloride 60 mL/hr at 09/15/13 0500     Assessment/Plan: 1) Diverticular bleed. 2) Anemia secondary to the bleed.   The patient remains stable.  He received two units PRBC as his HGB dropped to 7.8.  INR normalized with FFP.  He had a small bowel movement with blood this AM.  Plan: 1) Follow HGB. 2) If no bleeding over the next 24 hours he can be discharged home and follow up with Dr. Markham Jordan in [place].  LOS: 3 days   Benjamin Soto D 09/15/2013, 10:43 AM

## 2013-09-15 NOTE — Progress Notes (Addendum)
[facility] PROGRESS NOTE    Benjamin Soto JXB:[id number] DOB: 1936-01-10 DOA: 09/12/2013 PCP: Crawford Givens, MD  HPI/Brief narrative 77 year old male with history of atrial fibrillation on chronic Coumadin therapy, type II DM, HL, colonic polyps, diverticulosis by colonoscopy, admitted on 09/12/13 with several episodes of bright red bleeding per rectum. He denied nausea, vomiting, dizziness or lightheadedness. She was admitted for management and evaluation of GI bleed.   Assessment/Plan:  Lower GI bleed/diverticular bleed - Patient was admitted to the hospital. He was placed on liquid diet and GI was consulted. - GI states that patient's presentation is consistent with diverticular bleed. Diverticulae were confirmed by colonoscopy in 2009. Dr. Elnoria Howard does not plan colonoscopy at this time. - Patient's rectal bleed had gradually continued to decrease. He had minimal rectal bleeding on 12/25 AM and had no BM until 12/26 AM when again had small rectal bleeding. - Coumadin held since admission and patient received one unit of FFP on 12/25 due to worsening anemia and ongoing rectal bleeding. - Improving. As discussed with Dr. Elnoria Howard, if no bleeding over next 24 hours, patient can be discharged home with resumption of Coumadin and outpatient followup with his primary GI M.D.  Acute blood loss anemia/pancytopenia - Secondary to GI bleed. Approximately 5.5 g drop in hemoglobin since admission. Monitor CBCs closely and transfuse if hemoglobin is less than 8 g per DL or if become significantly symptomatic. Currently relatively asymptomatic. - Patient's hemoglobin dropped to the lowest of 7.8 g per DL on 13/08. He was transfused 2 units of PRBCs with appropriate improvement.  Type II DM - Continue SSI.  A. Fib -Mostly with controlled ventricular rate. Coumadin held. INR partially reversed with FFP. Continue Coreg  Chronic systolic CHF/CAD/dilated cardiomyopathy - Stable. Continue Coreg, Zocor  and Lasix currently held. Clinically euvolemic.  Hypertension - Stable/soft blood pressure. Continue Coreg    Code Status: Full Family Communication: Discussed with spouse, updated care and answered questions on 12/26. Disposition Plan: Home when medically stable   Consultants:  GI  Procedures:  None  Antibiotics:  None   Subjective: Patient had small rectal bleeding on 12/25 AM and no BM until this morning and had again a small rectal bleeding-witnessed by nurse.  Objective: Filed Vitals:   09/15/13 0330 09/15/13 0354 09/15/13 0500 09/15/13 1129  BP: 107/67 113/81 124/69 108/62  Pulse: 78 83 82 76  Temp: 97.3 F (36.3 C) 97.3 F (36.3 C) 97.5 F (36.4 C)   TempSrc: Oral Oral Oral   Resp: 20 20 18  82  Height:      Weight:   88.1 kg (194 lb 3.6 oz)   SpO2: 99% 99% 98% 99%    Intake/Output Summary (Last 24 hours) at 09/15/13 1243 Last data filed at 09/15/13 1151  Gross per 24 hour  Intake 3040.5 ml  Output   2080 ml  Net  960.5 ml   Filed Weights   09/13/13 0519 09/14/13 0705 09/15/13 0500  Weight: 88.4 kg (194 lb 14.2 oz) 90.13 kg (198 lb 11.2 oz) 88.1 kg (194 lb 3.6 oz)     Exam:  General exam: Pleasant elderly male lying comfortably supine in bed. Respiratory system: Clear. No increased work of breathing. Cardiovascular system: S1 & S2 heard, irregularly irregular. No JVD, murmurs, gallops, clicks or pedal edema. Telemetry: Atrial flutter/ with controlled ventricular rate. Gastrointestinal system: Abdomen is nondistended, soft and nontender. Normal bowel sounds heard. Central nervous system: Alert and oriented. No focal neurological deficits. Extremities: Symmetric 5 x 5  power.   Data Reviewed: Basic Metabolic Panel:  Recent Labs Lab 09/12/13 1143 09/13/13 0705 09/14/13 0450  NA 139 140 142  K 5.1 5.2* 4.1  CL 105 109 111  CO2 25 24 21   GLUCOSE 96 93 104*  BUN 17 17 15   CREATININE 1.11 1.11 1.07  CALCIUM 8.8 8.1* 7.6*   Liver  Function Tests:  Recent Labs Lab 09/12/13 1143  AST 20  ALT 16  ALKPHOS 67  BILITOT 0.6  PROT 6.2  ALBUMIN 3.6   No results found for this basename: LIPASE, AMYLASE,  in the last 168 hours No results found for this basename: AMMONIA,  in the last 168 hours CBC:  Recent Labs Lab 09/13/13 1420 09/13/13 2200 09/14/13 0450 09/14/13 1706 09/15/13 0600  WBC 3.7* 3.5* 3.5* 3.3* 4.1  HGB 9.3* 8.8* 8.6* 7.8* 9.9*  HCT 28.8* 26.7* 26.4* 24.1* 30.0*  MCV 98.3 97.8 98.5 99.2 96.2  PLT 118* 113* 115* 113* 108*   Cardiac Enzymes: No results found for this basename: CKTOTAL, CKMB, CKMBINDEX, TROPONINI,  in the last 168 hours BNP (last 3 results) No results found for this basename: PROBNP,  in the last 8760 hours CBG:  Recent Labs Lab 09/14/13 1059 09/14/13 1612 09/14/13 2056 09/15/13 0613 09/15/13 1111  GLUCAP 140* 112* 143* 111* 127*    Recent Results (from the past 240 hour(s))  URINE CULTURE     Status: None   Collection Time    09/12/13  5:55 PM      Result Value Range Status   Specimen Description URINE, RANDOM   Final   Special Requests NONE   Final   Culture  Setup Time     Final   Value: 09/12/2013 20:12     Performed at [facility] Count     Final   Value: 20,OOO COLONIES/ML     Performed at [facility]   Culture     Final   Value: ENTEROCOCCUS SPECIES     Performed at [facility]   Report Status PENDING   Incomplete      Additional labs: 1. Hemoglobin A1c: 5.7     Studies: No results found.      Scheduled Meds: . allopurinol  300 mg Oral Daily  . carvedilol  12.5 mg Oral BID WC  . cycloSPORINE  1 drop Both Eyes BID  . insulin aspart  0-9 Units Subcutaneous TID WC  . simvastatin  20 mg Oral q1800  . sodium chloride  3 mL Intravenous Q12H   Continuous Infusions:    Principal Problem:   GI bleed Active Problems:   DIABETES MELLITUS, TYPE II   HYPERLIPIDEMIA   Essential hypertension, benign    Atrial fibrillation   Diverticulosis of colon   Systolic CHF, chronic   CAD (coronary artery disease)   Congestive dilated cardiomyopathy   GIB (gastrointestinal bleeding)   Acute post-hemorrhagic anemia   Thrombocytopenia, unspecified    Time spent: 25 minutes.    Marcellus Scott, MD, FACP, FHM. [facility] Pager [phone]  If 7PM-7AM, please contact night-coverage [email or web address] Password [facility] 09/15/2013, 12:43 PM    LOS: 3 days

## 2013-09-15 NOTE — Progress Notes (Signed)
UR completed [facility] Deady K. Haruko Mersch, RN, BSN, MSHL, CCM  09/15/2013 4:00 PM

## 2013-09-16 LAB — TYPE AND SCREEN
ABO/RH(D): A POS
Antibody Screen: NEGATIVE
Unit division: 0
Unit division: 0
Unit division: 0
Unit division: 0

## 2013-09-16 LAB — CBC
HCT: 29.8 % — ABNORMAL LOW (ref 39.0–52.0)
Hemoglobin: 9.7 g/dL — ABNORMAL LOW (ref 13.0–17.0)
MCH: 31.1 pg (ref 26.0–34.0)
MCHC: 32.6 g/dL (ref 30.0–36.0)
MCV: 95.5 fL (ref 78.0–100.0)
Platelets: 121 10*3/uL — ABNORMAL LOW (ref 150–400)
RBC: 3.12 MIL/uL — ABNORMAL LOW (ref 4.22–5.81)
RDW: 16.6 % — ABNORMAL HIGH (ref 11.5–15.5)
WBC: 4.1 10*3/uL (ref 4.0–10.5)

## 2013-09-16 LAB — GLUCOSE, CAPILLARY
Glucose-Capillary: 143 mg/dL — ABNORMAL HIGH (ref 70–99)
Glucose-Capillary: 92 mg/dL (ref 70–99)
Glucose-Capillary: 131 mg/dL — ABNORMAL HIGH (ref 70–99)

## 2013-09-16 LAB — BASIC METABOLIC PANEL
BUN: 13 mg/dL (ref 6–23)
CO2: 27 mEq/L (ref 19–32)
Calcium: 7.8 mg/dL — ABNORMAL LOW (ref 8.4–10.5)
Chloride: 107 mEq/L (ref 96–112)
Creatinine, Ser: 1.04 mg/dL (ref 0.50–1.35)
GFR calc Af Amer: 79 mL/min — ABNORMAL LOW (ref >90)
GFR calc non Af Amer: 68 mL/min — ABNORMAL LOW (ref >90)
Glucose, Bld: 92 mg/dL (ref 70–99)
Potassium: 3.9 mEq/L (ref 3.5–5.1)
Sodium: 141 mEq/L (ref 135–145)

## 2013-09-16 LAB — PREPARE FRESH FROZEN PLASMA: Unit division: 0

## 2013-09-16 NOTE — Progress Notes (Signed)
1530 discharge instructions  Given to pt and spouse . Verbalized  Understanding

## 2013-09-16 NOTE — Progress Notes (Signed)
     Progress Note   Subjective  *Feels well.  Anxious to go home.  No further bleeding.**   Objective  Vital signs in last 24 hours: Temp:  [97.1 F (36.2 C)-97.9 F (36.6 C)] 97.1 F (36.2 C) (12/27 1500) Pulse Rate:  [81-88] 82 (12/27 1500) Resp:  [16-18] 16 (12/27 1500) BP: (104-125)/(62-72) 110/70 mmHg (12/27 1500) SpO2:  [98 %-100 %] 100 % (12/27 1500) Weight:  [195 lb 1.7 oz (88.5 kg)] 195 lb 1.7 oz (88.5 kg) (12/27 0525) Last BM Date: 09/16/13 General:   Alert,  Well-developed,  white male in NAD Heart:  Regular rate and rhythm; no murmurs Abdomen:  Soft, nontender and nondistended. Normal bowel sounds, without guarding, and without rebound.   Extremities:  Without edema. Neurologic:  Alert and  oriented x4;  grossly normal neurologically. Psych:  Alert and cooperative. Normal mood and affect.  Intake/Output from previous day: 12/26 0701 - 12/27 0700 In: 980 [P.O.:980] Out: 1050 [Urine:1050] Intake/Output this shift: Total I/O In: 480 [P.O.:480] Out: 250 [Urine:250]  Lab Results:  Recent Labs  09/15/13 0600 09/15/13 1555 09/16/13 0549  WBC 4.1 4.0 4.1  HGB 9.9* 9.6* 9.7*  HCT 30.0* 28.6* 29.8*  PLT 108* 111* 121*   BMET  Recent Labs  09/14/13 0450 09/16/13 0549  NA 142 141  K 4.1 3.9  CL 111 107  CO2 21 27  GLUCOSE 104* 92  BUN 15 13  CREATININE 1.07 1.04  CALCIUM 7.6* 7.8*   LFT No results found for this basename: PROT, ALBUMIN, AST, ALT, ALKPHOS, BILITOT, BILIDIR, IBILI,  in the last 72 hours PT/INR  Recent Labs  09/14/13 0450 09/15/13 0600  LABPROT 23.3* 19.2*  INR 2.15* 1.67*   Hepatitis Panel No results found for this basename: HEPBSAG, HCVAB, HEPAIGM, HEPBIGM,  in the last 72 hours  Studies/Results: No results found.    Assessment & Plan  *Acute lower GI bleed secondary to diverticula.  Bleeding has resolved.  Off anticoagulants.  Okay to discharge home and carefully restart Coumadin. Followup with Dr. Luisa Hart  hung ** Principal Problem:   GI bleed Active Problems:   DIABETES MELLITUS, TYPE II   HYPERLIPIDEMIA   Essential hypertension, benign   Atrial fibrillation   Diverticulosis of colon   Systolic CHF, chronic   CAD (coronary artery disease)   Congestive dilated cardiomyopathy   GIB (gastrointestinal bleeding)   Acute post-hemorrhagic anemia   Thrombocytopenia, unspecified     LOS: 4 days   Benjamin Soto  09/16/2013, 3:36 PM

## 2013-09-16 NOTE — Progress Notes (Signed)
1520 Seen by Dr. Janeal Holmes , low fiber diet  Article provided the pt and spouse as ordered

## 2013-09-16 NOTE — Discharge Summary (Signed)
Physician Discharge Summary  Quinnlan Abruzzo ZOX:[id number] DOB: 1936-03-01 DOA: 09/12/2013  PCP: Crawford Givens, MD  Admit date: 09/12/2013 Discharge date: 09/16/2013  Time spent: Greater than 30 minutes  Recommendations for Outpatient Follow-up:  1. Dr. Crawford Givens, PCP in 5 days with repeat labs (CBC, PT, INR and BMP) 2. Dr. Jeani Hawking, GI in 1 week 3. Consider resuming aspirin as outpatient when deemed appropriate.  Discharge Diagnoses:  Principal Problem:   GI bleed Active Problems:   DIABETES MELLITUS, TYPE II   HYPERLIPIDEMIA   Essential hypertension, benign   Atrial fibrillation   Diverticulosis of colon   Systolic CHF, chronic   CAD (coronary artery disease)   Congestive dilated cardiomyopathy   GIB (gastrointestinal bleeding)   Acute post-hemorrhagic anemia   Thrombocytopenia, unspecified   Discharge Condition: Improved & Stable  Diet recommendation: Heart healthy, diabetic diet  Filed Weights   09/14/13 0705 09/15/13 0500 09/16/13 0525  Weight: 90.13 kg (198 lb 11.2 oz) 88.1 kg (194 lb 3.6 oz) 88.5 kg (195 lb 1.7 oz)    History of present illness:  77 year old male with history of atrial fibrillation on chronic Coumadin therapy, type II DM, HL, colonic polyps, diverticulosis by colonoscopy, admitted on 09/12/13 with several episodes of bright red bleeding per rectum. He denied nausea, vomiting, dizziness or lightheadedness. She was admitted for management and evaluation of GI bleed.  Hospital Course:   Lower GI bleed/diverticular bleed  - Patient was admitted to the hospital. He was placed on liquid diet and GI was consulted.  - GI states that patient's presentation is consistent with diverticular bleed. Diverticulae were confirmed by colonoscopy in 2009. Dr. Elnoria Howard does not plan colonoscopy at this time.  - Coumadin held since admission and patient received one unit of FFP on 12/25 due to worsening anemia and ongoing rectal bleeding.  - As discussed with  Dr. Elnoria Howard on 12/26, if no bleeding over next 24 hours, patient can be discharged home with resumption of Coumadin and outpatient followup with his primary GI M.D.  - Resolved with conservative management. BM with small dark blood this morning was probably residual blood. Hemoglobin stable. - Patient states that he has not seen Dr. Mechele Collin since last colonoscopy 3 years ago and wishes to follow up with Dr. Elnoria Howard as outpatient. - Aspirin will be held until followup with PCP in the next few days. Coumadin resumed at discharge.  Acute blood loss anemia/pancytopenia  - Secondary to GI bleed. Approximately 5.5 g drop in hemoglobin since admission. Monitor CBCs closely and transfuse if hemoglobin is less than 8 g per DL or if become significantly symptomatic. Currently relatively asymptomatic.  - Patient's hemoglobin dropped to the lowest of 7.8 g per DL on 81/19. He was transfused 2 units of PRBCs with appropriate improvement.  - Stable  Type II DM  - Treated with a sliding scale in the hospital. Resume her metformin at discharge.  A. Fib  -Mostly with controlled ventricular rate. Coumadin held in the hospital and will be resumed at discharge. INR partially reversed with FFP. Continue Coreg   Chronic systolic CHF/CAD/dilated cardiomyopathy  - Stable. Continue Coreg, Zocor. Patient states that he has not taken Lasix in a long time. Clinically euvolemic.   Hypertension  - Stable/soft blood pressure. Continue Coreg   Consultations:  Gastroenterology  Procedures:  None    Discharge Exam:  Complaints:  Patient had no BM since yesterday until this morning when he had 2 BMs-initial one with trace dark blood (witnessed by  M.D.) & the next one with normal colored stools. No dizziness or lightheadedness.  Filed Vitals:   09/15/13 2038 09/16/13 0525 09/16/13 1025 09/16/13 1500  BP: 110/62 125/72 104/70 110/70  Pulse: 88 84 81 82  Temp: 97.9 F (36.6 C) 97.5 F (36.4 C)  97.1 F (36.2 C)   TempSrc: Oral Oral  Oral  Resp: 18 18 16 16   Height:      Weight:  88.5 kg (195 lb 1.7 oz)    SpO2: 98% 98% 99% 100%    General exam: Pleasant elderly male lying comfortably supine in bed.  Respiratory system: Clear. No increased work of breathing.  Cardiovascular system: S1 & S2 heard, irregularly irregular. No JVD, murmurs, gallops, clicks or pedal edema. Telemetry: Atrial flutter/ with controlled ventricular rate.  Gastrointestinal system: Abdomen is nondistended, soft and nontender. Normal bowel sounds heard.  Central nervous system: Alert and oriented. No focal neurological deficits.  Extremities: Symmetric 5 x 5 power.   Discharge Instructions      Discharge Orders   Future Appointments Provider Department Dept Phone   10/02/2013 10:15 AM [name] [phone]   Future Orders Complete By Expires   Call MD for:  extreme fatigue  As directed    Call MD for:  persistant dizziness or light-headedness  As directed    Call MD for:  As directed    Comments:     Worsening rectal bleeding.   Diet - low sodium heart healthy  As directed    Diet Carb Modified  As directed    Increase activity slowly  As directed        Medication List    STOP taking these medications       aspirin 81 MG tablet     furosemide 40 MG tablet  Commonly known as:  LASIX      TAKE these medications       albuterol 108 (90 BASE) MCG/ACT inhaler  Commonly known as:  PROVENTIL HFA;VENTOLIN HFA  Inhale 2 puffs into the lungs every 6 (six) hours as needed for wheezing.     allopurinol 100 MG tablet  Commonly known as:  ZYLOPRIM  Take 300 mg by mouth daily.     carvedilol 12.5 MG tablet  Commonly known as:  COREG  Take 12.5 mg by mouth 2 (two) times daily with a meal.     ipratropium 0.06 % nasal spray  Commonly known as:  ATROVENT  Place 1 spray into the nose daily as needed for rhinitis.     metFORMIN 500 MG tablet  Commonly known as:   GLUCOPHAGE  Take 1 tablet (500 mg total) by mouth 2 (two) times daily with a meal.     ONE TOUCH TEST STRIPS test strip  Generic drug:  glucose blood  Use once daily     RESTASIS 0.05 % ophthalmic emulsion  Generic drug:  cycloSPORINE  Place 1 drop into both eyes 2 (two) times daily.     simvastatin 20 MG tablet  Commonly known as:  ZOCOR  Take 20 mg by mouth daily.     warfarin 5 MG tablet  Commonly known as:  COUMADIN  Take 2.5-5 mg by mouth daily. Take 2.5mg  on Mon, Wed, and Fri. Take 5mg  all other days       Follow-up Information   Follow up with Crawford Givens, MD. Schedule an appointment as soon as possible for a visit in 5 days. (To be seen  with repeat labs (CBC, PT, INR & BMP))    Specialty:  Family Medicine   Contact information:   [address] [place] [address] [phone]       Follow up with Theda Belfast, MD. Schedule an appointment as soon as possible for a visit in 1 week.   Specialty:  Gastroenterology   Contact information:   [address] [place] [address] [phone]        The results of significant diagnostics from this hospitalization (including imaging, microbiology, ancillary and laboratory) are listed below for reference.    Significant Diagnostic Studies: No results found.  Microbiology: Recent Results (from the past 240 hour(s))  URINE CULTURE     Status: None   Collection Time    09/12/13  5:55 PM      Result Value Range Status   Specimen Description URINE, RANDOM   Final   Special Requests NONE   Final   Culture  Setup Time     Final   Value: 09/12/2013 20:12     Performed at [facility] Count     Final   Value: 20,OOO COLONIES/ML     Performed at [facility]   Culture     Final   Value: ENTEROCOCCUS SPECIES     Performed at [facility]   Report Status 09/15/2013 FINAL   Final   Organism ID, Bacteria ENTEROCOCCUS SPECIES   Final     Labs: Basic  Metabolic Panel:  Recent Labs Lab 09/12/13 1143 09/13/13 0705 09/14/13 0450 09/16/13 0549  NA 139 140 142 141  K 5.1 5.2* 4.1 3.9  CL 105 109 111 107  CO2 25 24 21 27   GLUCOSE 96 93 104* 92  BUN 17 17 15 13   CREATININE 1.11 1.11 1.07 1.04  CALCIUM 8.8 8.1* 7.6* 7.8*   Liver Function Tests:  Recent Labs Lab 09/12/13 1143  AST 20  ALT 16  ALKPHOS 67  BILITOT 0.6  PROT 6.2  ALBUMIN 3.6   No results found for this basename: LIPASE, AMYLASE,  in the last 168 hours No results found for this basename: AMMONIA,  in the last 168 hours CBC:  Recent Labs Lab 09/14/13 0450 09/14/13 1706 09/15/13 0600 09/15/13 1555 09/16/13 0549  WBC 3.5* 3.3* 4.1 4.0 4.1  HGB 8.6* 7.8* 9.9* 9.6* 9.7*  HCT 26.4* 24.1* 30.0* 28.6* 29.8*  MCV 98.5 99.2 96.2 95.3 95.5  PLT 115* 113* 108* 111* 121*   Cardiac Enzymes: No results found for this basename: CKTOTAL, CKMB, CKMBINDEX, TROPONINI,  in the last 168 hours BNP: BNP (last 3 results) No results found for this basename: PROBNP,  in the last 8760 hours CBG:  Recent Labs Lab 09/15/13 1111 09/15/13 1641 09/15/13 2035 09/16/13 0616 09/16/13 1042  GLUCAP 127* 104* 143* 92 131*     Signed:  HONGALGI,ANAND, MD, FACP, FHM. [facility] Pager [phone]  If 7PM-7AM, please contact night-coverage [email or web address] Password [facility] 09/16/2013, 3:08 PM

## 2013-09-18 ENCOUNTER — Encounter: Payer: Self-pay | Admitting: Family Medicine

## 2013-09-18 ENCOUNTER — Ambulatory Visit (INDEPENDENT_AMBULATORY_CARE_PROVIDER_SITE_OTHER): Payer: Medicare Other | Admitting: Family Medicine

## 2013-09-18 VITALS — BP 118/72 | HR 74 | Temp 97.4°F | Wt 199.5 lb

## 2013-09-18 DIAGNOSIS — K922 Gastrointestinal hemorrhage, unspecified: Secondary | ICD-10-CM

## 2013-09-18 NOTE — Assessment & Plan Note (Signed)
Recheck CBC and INR today.  Would hold off on coumadin until his labs are back and he has seen GI next week- if both are okay, then could restart coumadin (ie if GI is okay with restart of coumadin).  D/w pt.  He agrees.  No ASA for now.  Appears well, okay for outpatient f/u.  Recheck BMET today as he has restarted his metformin.   Hospital records reviewed and d/w pt.

## 2013-09-18 NOTE — Patient Instructions (Signed)
Go to the lab on the way out.  We'll contact you with your lab report.  Ask Dr. Elnoria Howard about going back on coumadin.  If Elnoria Howard is okay with it, then call back and notify Carlena Sax.  Don't change your meds for now.  Take care.

## 2013-09-18 NOTE — Progress Notes (Signed)
Pre-visit discussion using our clinic review tool. No additional management support is needed unless otherwise documented below in the visit note.  Admitted for GIB, thought to be diverticular in origin.  Not likely to be diverticulitis.  He feels well now. On low fiber diet. No ASA or coumadin in the meantime.  No more bleeding.  No abd pain.  Has f/u with GI pending.  Back on metformin in the meantime.  No bruising except for blood draws.  No fevers.  Due for follow up labs.  His mentation is back to normal.   Meds, vitals, and allergies reviewed.   ROS: See HPI.  Otherwise, noncontributory.  nad ncat Scalp lesion appears healed.  Mmm rrr ctab abd soft, not ttp, normal BS Ext w/o edema.

## 2013-09-19 ENCOUNTER — Other Ambulatory Visit: Payer: Self-pay | Admitting: Family Medicine

## 2013-09-19 DIAGNOSIS — K922 Gastrointestinal hemorrhage, unspecified: Secondary | ICD-10-CM

## 2013-09-19 LAB — CBC WITH DIFFERENTIAL/PLATELET
Basophils Absolute: 0 10*3/uL (ref 0.0–0.1)
Basophils Relative: 0.4 % (ref 0.0–3.0)
Eosinophils Absolute: 0.2 10*3/uL (ref 0.0–0.7)
Eosinophils Relative: 3.2 % (ref 0.0–5.0)
HCT: 30.9 % — ABNORMAL LOW (ref 39.0–52.0)
Hemoglobin: 10.2 g/dL — ABNORMAL LOW (ref 13.0–17.0)
Lymphocytes Relative: 20.9 % (ref 12.0–46.0)
Lymphs Abs: 1 10*3/uL (ref 0.7–4.0)
MCHC: 32.9 g/dL (ref 30.0–36.0)
MCV: 95.1 fl (ref 78.0–100.0)
Monocytes Absolute: 0.3 10*3/uL (ref 0.1–1.0)
Monocytes Relative: 6.2 % (ref 3.0–12.0)
Neutro Abs: 3.3 10*3/uL (ref 1.4–7.7)
Neutrophils Relative %: 69.3 % (ref 43.0–77.0)
Platelets: 157 10*3/uL (ref 150.0–400.0)
RBC: 3.25 Mil/uL — ABNORMAL LOW (ref 4.22–5.81)
RDW: 16.8 % — ABNORMAL HIGH (ref 11.5–14.6)
WBC: 4.8 10*3/uL (ref 4.5–10.5)

## 2013-09-19 LAB — BASIC METABOLIC PANEL
BUN: 14 mg/dL (ref 6–23)
CO2: 29 mEq/L (ref 19–32)
Calcium: 8.9 mg/dL (ref 8.4–10.5)
Chloride: 107 mEq/L (ref 96–112)
Creatinine, Ser: 1.1 mg/dL (ref 0.4–1.5)
GFR: 72.3 mL/min (ref >60.00)
Glucose, Bld: 156 mg/dL — ABNORMAL HIGH (ref 70–99)
Potassium: 4.9 mEq/L (ref 3.5–5.1)
Sodium: 141 mEq/L (ref 135–145)

## 2013-09-19 LAB — PROTIME-INR
INR: 1.1 ratio — ABNORMAL HIGH (ref 0.8–1.0)
Prothrombin Time: 11.3 s (ref 10.2–12.4)

## 2013-09-22 ENCOUNTER — Ambulatory Visit: Payer: Medicare Other

## 2013-09-25 ENCOUNTER — Telehealth: Payer: Self-pay | Admitting: Family Medicine

## 2013-09-25 NOTE — Telephone Encounter (Signed)
Pt's wife came by and said that [facility] seen Dr. Almyra Free ??? And asked him about starting Coumadin and Dr. Damita Dunnings said that if it was ok with that Physician than he wanted [place] to let you know about getting him in to check his coumadin.

## 2013-09-26 NOTE — Telephone Encounter (Signed)
Spoke with pt's wife.  Gave instructions to restart normal dose of Coumadin and recheck in 1 week.

## 2013-10-02 ENCOUNTER — Other Ambulatory Visit (INDEPENDENT_AMBULATORY_CARE_PROVIDER_SITE_OTHER): Payer: Medicare Other

## 2013-10-02 ENCOUNTER — Ambulatory Visit: Payer: Medicare Other

## 2013-10-02 ENCOUNTER — Ambulatory Visit (INDEPENDENT_AMBULATORY_CARE_PROVIDER_SITE_OTHER): Payer: Medicare Other | Admitting: Family Medicine

## 2013-10-02 DIAGNOSIS — Z7901 Long term (current) use of anticoagulants: Secondary | ICD-10-CM

## 2013-10-02 DIAGNOSIS — Z5181 Encounter for therapeutic drug level monitoring: Secondary | ICD-10-CM

## 2013-10-02 DIAGNOSIS — I4891 Unspecified atrial fibrillation: Secondary | ICD-10-CM

## 2013-10-02 DIAGNOSIS — K922 Gastrointestinal hemorrhage, unspecified: Secondary | ICD-10-CM

## 2013-10-02 LAB — CBC WITH DIFFERENTIAL/PLATELET
Basophils Absolute: 0 10*3/uL (ref 0.0–0.1)
Basophils Relative: 0.6 % (ref 0.0–3.0)
Eosinophils Absolute: 0.2 10*3/uL (ref 0.0–0.7)
Eosinophils Relative: 3.5 % (ref 0.0–5.0)
HCT: 34.5 % — ABNORMAL LOW (ref 39.0–52.0)
Hemoglobin: 11.4 g/dL — ABNORMAL LOW (ref 13.0–17.0)
Lymphocytes Relative: 26.8 % (ref 12.0–46.0)
Lymphs Abs: 1.2 10*3/uL (ref 0.7–4.0)
MCHC: 33.1 g/dL (ref 30.0–36.0)
MCV: 95.4 fl (ref 78.0–100.0)
Monocytes Absolute: 0.3 10*3/uL (ref 0.1–1.0)
Monocytes Relative: 7.1 % (ref 3.0–12.0)
Neutro Abs: 2.7 10*3/uL (ref 1.4–7.7)
Neutrophils Relative %: 62 % (ref 43.0–77.0)
Platelets: 187 10*3/uL (ref 150.0–400.0)
RBC: 3.62 Mil/uL — ABNORMAL LOW (ref 4.22–5.81)
RDW: 16.3 % — ABNORMAL HIGH (ref 11.5–14.6)
WBC: 4.3 10*3/uL — ABNORMAL LOW (ref 4.5–10.5)

## 2013-10-02 LAB — POCT INR: INR: 1.3

## 2013-10-03 ENCOUNTER — Other Ambulatory Visit: Payer: Medicare Other

## 2013-10-16 ENCOUNTER — Ambulatory Visit (INDEPENDENT_AMBULATORY_CARE_PROVIDER_SITE_OTHER): Payer: Medicare Other | Admitting: Family Medicine

## 2013-10-16 DIAGNOSIS — Z7901 Long term (current) use of anticoagulants: Secondary | ICD-10-CM

## 2013-10-16 DIAGNOSIS — Z5181 Encounter for therapeutic drug level monitoring: Secondary | ICD-10-CM

## 2013-10-16 DIAGNOSIS — I4891 Unspecified atrial fibrillation: Secondary | ICD-10-CM

## 2013-10-16 LAB — POCT INR: INR: 3.5

## 2013-11-02 ENCOUNTER — Other Ambulatory Visit: Payer: Self-pay | Admitting: *Deleted

## 2013-11-02 MED ORDER — IPRATROPIUM BROMIDE 0.06 % NA SOLN: 1.0000 | Freq: Every day | NASAL | Status: DC | PRN

## 2013-11-02 MED ORDER — CARVEDILOL 12.5 MG PO TABS: 12.5000 mg | ORAL_TABLET | Freq: Two times a day (BID) | ORAL | Status: DC

## 2013-11-02 MED ORDER — WARFARIN SODIUM 5 MG PO TABS: ORAL_TABLET | ORAL | Status: DC

## 2013-11-02 MED ORDER — METFORMIN HCL 500 MG PO TABS: 500.0000 mg | ORAL_TABLET | Freq: Two times a day (BID) | ORAL | Status: DC

## 2013-11-02 MED ORDER — GLUCOSE BLOOD VI STRP: ORAL_STRIP | Status: DC

## 2013-11-02 MED ORDER — SIMVASTATIN 20 MG PO TABS: 20.0000 mg | ORAL_TABLET | Freq: Every day | ORAL | Status: DC

## 2013-11-02 MED ORDER — FUROSEMIDE 40 MG PO TABS: 40.0000 mg | ORAL_TABLET | ORAL | Status: DC | PRN

## 2013-11-02 NOTE — Telephone Encounter (Addendum)
Pt left voicemail with triage requesting refills of medications (pt did request a refill for allopurinol 100mg  and allopurinol 300mg ), also said he needs a refill of lasix (not on med list), pt requesting a 90 day supply and a years worth of refills on all medications.  Patient asks that these be printed for pick up.  He wants to take them to [facility] and request his refills as needed.  Please call when Rx's ready for pick up.

## 2013-11-02 NOTE — Telephone Encounter (Signed)
Printed except for the allopurinol. Clarify this.  300mg  of either, ie just the 300mg  tabs, should suffice.  Thanks.

## 2013-11-03 NOTE — Telephone Encounter (Signed)
Benjamin Soto says he takes Allopurinol 300 mg, one tab daily and Allopurinol 100 mg., one tab daily for a total of 400 mg daily.  The sig on the Allopurinol 100 mg is incorrect.  I have corrected in on the meds list.  Please print accordingly for pick up on Monday if you are agreeable.

## 2013-11-05 MED ORDER — ALLOPURINOL 300 MG PO TABS: 300.0000 mg | ORAL_TABLET | Freq: Every day | ORAL | Status: DC

## 2013-11-05 MED ORDER — ALLOPURINOL 100 MG PO TABS: 300.0000 mg | ORAL_TABLET | Freq: Every day | ORAL | Status: DC

## 2013-11-05 NOTE — Telephone Encounter (Signed)
Thanks. Printed.

## 2013-11-06 NOTE — Telephone Encounter (Signed)
Patient advised.  Rx left at front desk for pick up. 

## 2013-11-09 ENCOUNTER — Ambulatory Visit (INDEPENDENT_AMBULATORY_CARE_PROVIDER_SITE_OTHER): Payer: Medicare Other | Admitting: Family Medicine

## 2013-11-09 ENCOUNTER — Encounter: Payer: Self-pay | Admitting: Family Medicine

## 2013-11-09 VITALS — BP 102/60 | HR 75 | Temp 97.4°F | Wt 199.8 lb

## 2013-11-09 DIAGNOSIS — E119 Type 2 diabetes mellitus without complications: Secondary | ICD-10-CM

## 2013-11-09 MED ORDER — METFORMIN HCL 500 MG PO TABS: 500.0000 mg | ORAL_TABLET | Freq: Every day | ORAL | Status: DC

## 2013-11-09 NOTE — Patient Instructions (Signed)
Cut back to 1 metformin a day.  See if you GI symptoms are better.  If not, then stop it completely.  Update me at that point- let me know how you feel and how your sugars readings have been averaging.  Physical at the end of May or thereafter.  Take care.

## 2013-11-09 NOTE — Progress Notes (Signed)
Pre visit review using our clinic review tool, if applicable. No additional management support is needed unless otherwise documented below in the visit note.  Sensation of needles on the face bilaterally yesterday.  No focal neuro sx at that point.  He had a BM just before the episode.  He didn't vomit.  He felt better after the event.  He has had frequent belching recently.  No blood in stool.  It was unclear if GI upset let to the post-BM symptoms yesterday.  He clearly has had more flatus and belching recently.    Diabetes:  Using medications without difficulties:see above Hypoglycemic episodes:no Hyperglycemic episodes:no Feet problems:no Blood Sugars averaging:113 was the highest he's recorded.    Meds, vitals, and allergies reviewed.   ROS: See HPI.  Otherwise, noncontributory.  GEN: nad, alert and oriented HEENT: mucous membranes moist NECK: supple w/o LA CV: IRR PULM: ctab, no inc wob ABD: soft, +bs EXT: no edema SKIN: no acute rash

## 2013-11-10 ENCOUNTER — Telehealth: Payer: Self-pay

## 2013-11-10 NOTE — Assessment & Plan Note (Addendum)
Controlled by last A1c, unclear if the GI sx are metformin related.  Would taper metformin and see if improved.  He agrees.  He'll update me with sx and sugar readings.  We'll go from there.

## 2013-11-10 NOTE — Telephone Encounter (Signed)
Relevant patient education assigned to patient using Emmi. ° °

## 2013-11-13 ENCOUNTER — Ambulatory Visit (INDEPENDENT_AMBULATORY_CARE_PROVIDER_SITE_OTHER): Payer: Medicare Other | Admitting: Family Medicine

## 2013-11-13 DIAGNOSIS — Z7901 Long term (current) use of anticoagulants: Secondary | ICD-10-CM

## 2013-11-13 DIAGNOSIS — Z5181 Encounter for therapeutic drug level monitoring: Secondary | ICD-10-CM

## 2013-11-13 DIAGNOSIS — I4891 Unspecified atrial fibrillation: Secondary | ICD-10-CM

## 2013-11-13 LAB — POCT INR: INR: 2.3

## 2013-11-27 ENCOUNTER — Telehealth: Payer: Self-pay

## 2013-11-27 NOTE — Telephone Encounter (Signed)
Becky with Dr Beavis office request lab results 09/18/13 and 10/02/13 CBC with diff and basic met panel faxed to [phone]. Done. 

## 2013-11-27 NOTE — Telephone Encounter (Signed)
Pt's wife calling to ck on status of lab results. Becky at Dr Tommy Rainwater already received recent labs.

## 2013-11-30 ENCOUNTER — Ambulatory Visit: Payer: Medicare Other

## 2013-11-30 ENCOUNTER — Telehealth: Payer: Self-pay | Admitting: *Deleted

## 2013-11-30 NOTE — Telephone Encounter (Signed)
DATE  Blood Sugar 11/10/13  8:00  am  94 2/21   10:30 am 133 2/22   2:00 pm 88 2/23   4:00 pm 116 2/24   6:00 pm 120 2/25   7:00 am 106 2/26   8:30 am 111 2/27   8:00 pm 106 2/28   2:00 pm 108 3/1   9:00 am 122 3/4   8:00 am 99 3/8   7:00 am 88  Taking one (1) Metformin per day.  Feeling okay.  Please call if any changes are needed.

## 2013-11-30 NOTE — Telephone Encounter (Signed)
Sugars are fine.  Continue as is.  Thanks.

## 2013-12-01 NOTE — Telephone Encounter (Signed)
Wife advised. 

## 2013-12-11 ENCOUNTER — Ambulatory Visit (INDEPENDENT_AMBULATORY_CARE_PROVIDER_SITE_OTHER): Payer: Medicare Other | Admitting: Family Medicine

## 2013-12-11 DIAGNOSIS — Z7901 Long term (current) use of anticoagulants: Secondary | ICD-10-CM

## 2013-12-11 DIAGNOSIS — Z5181 Encounter for therapeutic drug level monitoring: Secondary | ICD-10-CM

## 2013-12-11 DIAGNOSIS — I4891 Unspecified atrial fibrillation: Secondary | ICD-10-CM

## 2013-12-11 LAB — POCT INR: INR: 3

## 2013-12-12 ENCOUNTER — Other Ambulatory Visit: Payer: Self-pay | Admitting: Family Medicine

## 2013-12-15 ENCOUNTER — Telehealth: Payer: Self-pay

## 2013-12-15 ENCOUNTER — Encounter: Payer: Self-pay | Admitting: Physician Assistant

## 2013-12-15 ENCOUNTER — Ambulatory Visit (INDEPENDENT_AMBULATORY_CARE_PROVIDER_SITE_OTHER): Payer: Medicare Other | Admitting: Physician Assistant

## 2013-12-15 VITALS — BP 112/72 | HR 77 | Ht 69.0 in | Wt 201.0 lb

## 2013-12-15 DIAGNOSIS — I428 Other cardiomyopathies: Secondary | ICD-10-CM

## 2013-12-15 DIAGNOSIS — I4891 Unspecified atrial fibrillation: Secondary | ICD-10-CM

## 2013-12-15 DIAGNOSIS — I420 Dilated cardiomyopathy: Secondary | ICD-10-CM

## 2013-12-15 DIAGNOSIS — E785 Hyperlipidemia, unspecified: Secondary | ICD-10-CM

## 2013-12-15 DIAGNOSIS — R0602 Shortness of breath: Secondary | ICD-10-CM

## 2013-12-15 MED ORDER — FUROSEMIDE 40 MG PO TABS: 40.0000 mg | ORAL_TABLET | ORAL | Status: DC

## 2013-12-15 MED ORDER — LOSARTAN POTASSIUM 25 MG PO TABS: 25.0000 mg | ORAL_TABLET | Freq: Every day | ORAL | Status: DC

## 2013-12-15 NOTE — Telephone Encounter (Signed)
Spoke w/ pt's wife.  She reports that pt was up all night w/ SOB and feeling that he couldn't catch his breath, almost like something was "stuck" preventing him from taking a deep breath. Reports that pt's wt has remained stable.  She would like for him to be seen today, as she wants this addressed before the weekend. Pt sched to see Nicole Kindred, [place] today at 1:45.

## 2013-12-15 NOTE — Assessment & Plan Note (Addendum)
Nonobstructive CAD by prior cath. 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 EF 20-25% by echo 07/2013. Endorses CHF type symptoms. Suspect lack of Lasix and ongoing EtOH contributing to mild decompensation and symptoms. Advised to take Lasix scheduled x 3 days, then every other day thereafter. Will add losartan 25mg  daily in an effort to optimize medical therapy. He will monitor his BP with this (hypotensive on lisinopril). Discussed indication for ICD. EF has remained unchanged for a year; however, has not been optimized on medical therapy and continues to drink. He and his wife would like to consider EP referral further. They will call with a decision next week. If ICD indicated, a Bi-V device may be appropriate given prolonged QRS. Advised to abstain from EtOH. Check BMET next week with these changes.  Note, he is also wheezing. This may be related to CHF, but he also has a h/o tobacco abuse. No appreciable wheeze on exam. No prior diagnosis of COPD. Recommended he follow-up with his PCP for further evaluation. Continue albuterol inh PRN.

## 2013-12-15 NOTE — Progress Notes (Signed)
Patient ID: Benjamin Soto, male   DOB: October 30, 1935, 78 y.o.   MRN: [id number]   Date:  12/15/2013   ID:  Benjamin Soto, DOB [birth year], MRN [id number]  PCP:  Elsie Stain, MD  Primary Cardiologist:  Johnny Bridge, MD   History of Present Illness:  Benjamin Soto is a 78 y.o. male PMHx s/f nonischemic cardiomyopathy (EF 20-25%), EtOH dependency, nonobstructive CAD, permanent atrial fibrillation, intermittent atrial flutter, h/o SVT, RBBB (QRS 160 msec), DM2, HLD and h/o tobacco abuse who presents today for follow-up.  He has been experiencing PND, orthopnea, 1-2 lbs weight gain, wheezing, recumbent cough, abdominal distention and LE edema for the past 3-4 days. He has a history of liberal alcohol consumption. He continues to drink 2 glasses of wine nightly and scotch a few times a week. Wife insinuates he drinks more than he describes. He takes Lasix every once in a while as needed. He has not attempted this recently for fear of low BP/dehydration. He no longer smokes, but has a h/o tobacco abuse. No prior diagnosis of COPD. No fevers, chills. Denies chest pain.  EKG: atrial fibrillation, RBBB, 77 bpm, RAD, no ST/T changes (unchanged from 08/2013 tracing)  Wt Readings from Last 3 Encounters:  12/15/13 201 lb (91.173 kg)  11/09/13 199 lb 12 oz (90.606 kg)  09/18/13 199 lb 8 oz (90.493 kg)     Past Medical History  Diagnosis Date  . Gout 04/22/2003    per Dr Jefm Bryant  . Hyperlipidemia 05/22/01  . Knee pain, right     [facility], Dr Jefm Bryant with rheumatology  . History of colon polyps 10/26/02    colonoscopy  . Hypogonadism male   . Elbow fracture, right 2014    "healed by itself"  . Permanent atrial fibrillation 01/20/2003  . SVT (supraventricular tachycardia)   . Diabetes mellitus, type 2 09/21/92  . History of GI bleed 09/12/2013    Seen by GI at [facility]- felt to be diverticular in origin. Resolved off Coumadin. Cleared to resume per GI.  Marland Kitchen Atrial flutter   . ETOH abuse   .  Nonischemic cardiomyopathy     EF 20-25% by 07/2013 echo  . CAD (coronary artery disease)     Nonobstructive by 06/2013 cath  . Anticoagulated on Coumadin   . History of tobacco abuse     Current Outpatient Prescriptions  Medication Sig Dispense Refill  . albuterol (PROVENTIL HFA;VENTOLIN HFA) 108 (90 BASE) MCG/ACT inhaler Inhale 2 puffs into the lungs every 6 (six) hours as needed for wheezing.  1 Inhaler  0  . allopurinol (ZYLOPRIM) 100 MG tablet Take 100 mg by mouth daily.      Marland Kitchen allopurinol (ZYLOPRIM) 300 MG tablet Take 1 tablet (300 mg total) by mouth daily.  90 tablet  3  . carvedilol (COREG) 12.5 MG tablet Take 1 tablet (12.5 mg total) by mouth 2 (two) times daily with a meal.  180 tablet  3  . furosemide (LASIX) 40 MG tablet Take 1 tablet (40 mg total) by mouth as needed. Use sparingly.  30 tablet  5  . glucose blood (ONE TOUCH TEST STRIPS) test strip Use once daily  100 each  3  . metFORMIN (GLUCOPHAGE) 500 MG tablet Take 1 tablet (500 mg total) by mouth daily with breakfast.      . simvastatin (ZOCOR) 20 MG tablet Take 1 tablet (20 mg total) by mouth daily.  90 tablet  3  . VENTOLIN HFA 108 (90 BASE) MCG/ACT inhaler INHALE  TWO PUFFS EVERY 6 HOURS AS NEEDED FOR WHEEZING  18 each  0  . warfarin (COUMADIN) 5 MG tablet Take 2.5mg  on Mon, Wed, and Fri. Take 5mg  all other days as directed  100 tablet  3   No current facility-administered medications for this visit.    Allergies:    Allergies  Allergen Reactions  . Aspirin     GI bleed  . Lisinopril     REACTION: hyperkalemia at high dose  . Varenicline Tartrate     REACTION: vivid dreams, nausea    Social History:  The patient  reports that he quit smoking about 4 months ago. His smoking use included Cigarettes and Pipe. He has a 11 pack-year smoking history. He has never used smokeless tobacco. He reports that he drinks about 2.4 ounces of alcohol per week. He reports that he does not use illicit drugs.   Family History:    Family History  Problem Relation Age of Onset  . Diabetes Father   . Other Mother     Deceased  . Prostate cancer Neg Hx   . Healthy Son   . Colon cancer Daughter     Cancer free    Review of Systems: General: negative for chills, fever, night sweats or weight changes.  Cardiovascular: negative for chest pain, dyspnea on exertion, palpitations, negative for edema, orthopnea, paroxysmal nocturnal dyspnea or shortness of breath Dermatological: negative for rash Respiratory: positive for nonproductive cough, negative for wheezing Urologic: negative for hematuria Abdominal:  negative for nausea, vomiting, diarrhea, bright red blood per rectum, melena, or hematemesis Neurologic: negative for visual changes, syncope, or dizziness All other systems reviewed and are otherwise negative except as noted above.  PHYSICAL EXAM: VS:  BP 112/72  Pulse 77  Ht 5\' 9"  (1.753 m)  Wt 201 lb (91.173 kg)  BMI 29.67 kg/m2 Well nourished, well developed, in no acute distress HEENT: normal, PERRL Neck: JVP 7 cm, no bruits Cardiac:  normal S1, S2; RRR; no murmur or gallops Lungs:  clear to auscultation bilaterally, no wheezing, rhonchi or rales Abd: soft, mildly distended, nontender, no hepatomegaly, normoactive BS x 4 quads Ext: 1+ bilateral pretibial edema, cyanosis or clubbing Skin: warm and dry, cap refill < 2 sec Neuro:  CNs 2-12 intact, no focal abnormalities noted Musculoskeletal: strength and tone appropriate for age  Psych: normal affect

## 2013-12-15 NOTE — Patient Instructions (Addendum)
Please take Lasix 40mg  by mouth daily for the next 3 days, then every other day after that.   Please increase the potassium in your diet (Lasix can reduce potassium in your blood).  Please use your albuterol inhaler as needed for wheezing and follow-up with your primary care doctor to evaluate for COPD.  Please refrain from alcohol use.   We will start you on low-dose losartan. Please take as prescribed.  We will obtain labwork next week.   Please call us if you decide to schedule an appointment with Dr. Caryl Comes (an electrophysiologist regarding an implantable cardioverter-defibrillator).   Please monitor your weight, breathing, swelling and blood pressure with these changes.  We will see you back in 1 month or as needed if symptoms worsen or do not improve.

## 2013-12-15 NOTE — Assessment & Plan Note (Signed)
Continue statin. 

## 2013-12-15 NOTE — Telephone Encounter (Signed)
Pt wife called and states he had a "horrible night" SOB, wheezing. States when he sits up it is a little better, but when he lays down it is worse.

## 2013-12-15 NOTE — Assessment & Plan Note (Addendum)
Rate-controlled, course on EKG today. RBBB evident. Continue carvedilol and warfarin. INRs followed by PCP- 3.0 on 3/23. No abnormal bleeding. Tolerates rhythm well at baseline.

## 2013-12-22 ENCOUNTER — Other Ambulatory Visit: Payer: Medicare Other

## 2013-12-22 DIAGNOSIS — R0602 Shortness of breath: Secondary | ICD-10-CM

## 2013-12-23 LAB — BASIC METABOLIC PANEL
BUN/Creatinine Ratio: 18 (ref 10–22)
BUN: 22 mg/dL (ref 8–27)
CO2: 20 mmol/L (ref 18–29)
Calcium: 9.2 mg/dL (ref 8.6–10.2)
Chloride: 100 mmol/L (ref 97–108)
Creatinine, Ser: 1.23 mg/dL (ref 0.76–1.27)
GFR calc Af Amer: 66 mL/min/{1.73_m2} (ref >59)
GFR calc non Af Amer: 57 mL/min/{1.73_m2} — ABNORMAL LOW (ref >59)
Glucose: 87 mg/dL (ref 65–99)
Potassium: 5 mmol/L (ref 3.5–5.2)
Sodium: 138 mmol/L (ref 134–144)

## 2013-12-28 ENCOUNTER — Telehealth: Payer: Self-pay | Admitting: *Deleted

## 2013-12-28 NOTE — Telephone Encounter (Signed)
Patient's wife called she would like her husbands lab results

## 2013-12-28 NOTE — Telephone Encounter (Signed)
Reviewed results w/ wife.

## 2014-01-08 ENCOUNTER — Encounter: Payer: Self-pay | Admitting: Family Medicine

## 2014-01-08 ENCOUNTER — Ambulatory Visit: Payer: Medicare Other

## 2014-01-08 ENCOUNTER — Ambulatory Visit (INDEPENDENT_AMBULATORY_CARE_PROVIDER_SITE_OTHER): Payer: Medicare Other | Admitting: Family Medicine

## 2014-01-08 VITALS — BP 102/58 | HR 81 | Temp 97.5°F | Ht 69.0 in | Wt 191.8 lb

## 2014-01-08 DIAGNOSIS — Z0000 Encounter for general adult medical examination without abnormal findings: Secondary | ICD-10-CM

## 2014-01-08 DIAGNOSIS — R413 Other amnesia: Secondary | ICD-10-CM

## 2014-01-08 DIAGNOSIS — E119 Type 2 diabetes mellitus without complications: Secondary | ICD-10-CM

## 2014-01-08 DIAGNOSIS — I420 Dilated cardiomyopathy: Secondary | ICD-10-CM

## 2014-01-08 DIAGNOSIS — I10 Essential (primary) hypertension: Secondary | ICD-10-CM

## 2014-01-08 NOTE — Patient Instructions (Addendum)
Check with your insurance to see if they will cover the tetanus shot. Let us know about the eye drops.   Check an A1c and lipid panel at the lab when you get a coumadin check (in about 3 months).  Take care. Glad to see you.

## 2014-01-08 NOTE — Assessment & Plan Note (Signed)
Controlled, continue as is.  

## 2014-01-08 NOTE — Progress Notes (Signed)
Pre visit review using our clinic review tool, if applicable. No additional management support is needed unless otherwise documented below in the visit note.  I have personally reviewed the Medicare Annual Wellness questionnaire and have noted 1. The patient's medical and social history 2. Their use of alcohol, tobacco or illicit drugs 3. Their current medications and supplements 4. The patient's functional ability including ADL's, fall risks, home safety risks and hearing or visual             impairment. 5. Diet and physical activities 6. Evidence for depression or mood disorders  The patients weight, height, BMI have been recorded in the chart and visual acuity is per eye clinic.  I have made referrals, counseling and provided education to the patient based review of the above and I have provided the pt with a written personalized care plan for preventive services.  See scanned forms.  Routine anticipatory guidance given to patient.  See health maintenance. Flu 2014 Shingles 2012 PNA 2010 Tetanus 2004 Colonoscopy 2009 Prostate cancer screening and PSA options (with potential risks and benefits of testing vs not testing) were discussed along with recent recs/guidelines.  He declined testing PSA at this point. Advance directive- wife designated.  Cognitive function addressed- see scanned forms- and if abnormal then additional documentation follows.   Diabetes:  Using medications without difficulties:yes Hypoglycemic episodes:no Hyperglycemic episodes:no Feet problems:no Blood Sugars averaging: ~100  Hypertension:    Using medication without problems or lightheadedness: yes Chest pain with exertion:no Edema:no Short of breath:no  He asked about ICD placement. I'll defer to cards.   PMH and SH reviewed  Meds, vitals, and allergies reviewed.   ROS: See HPI.  Otherwise negative.    GEN: nad, alert and oriented HEENT: mucous membranes moist NECK: supple w/o LA CV:  IRR PULM: ctab, no inc wob ABD: soft, +bs EXT: no edema SKIN: no acute rash

## 2014-01-08 NOTE — Assessment & Plan Note (Signed)
See scanned forms.  Routine anticipatory guidance given to patient.  See health maintenance. Flu 2014 Shingles 2012 PNA 2010 Tetanus 2004 Colonoscopy 2009 Prostate cancer screening and PSA options (with potential risks and benefits of testing vs not testing) were discussed along with recent recs/guidelines.  He declined testing PSA at this point. Advance directive- wife designated.  Cognitive function addressed- see scanned forms- and if abnormal then additional documentation follows.

## 2014-01-08 NOTE — Assessment & Plan Note (Signed)
Controlled on home checks, will check A1c later in the year.

## 2014-01-08 NOTE — Assessment & Plan Note (Signed)
Memory wnl on testing today.

## 2014-01-08 NOTE — Assessment & Plan Note (Signed)
Per cards  

## 2014-01-15 ENCOUNTER — Ambulatory Visit (INDEPENDENT_AMBULATORY_CARE_PROVIDER_SITE_OTHER): Payer: Medicare Other | Admitting: Family Medicine

## 2014-01-15 ENCOUNTER — Ambulatory Visit (INDEPENDENT_AMBULATORY_CARE_PROVIDER_SITE_OTHER): Payer: Medicare Other | Admitting: Cardiovascular Disease

## 2014-01-15 ENCOUNTER — Encounter: Payer: Self-pay | Admitting: Cardiovascular Disease

## 2014-01-15 ENCOUNTER — Encounter: Payer: Self-pay | Admitting: Family Medicine

## 2014-01-15 VITALS — BP 110/72 | HR 81 | Ht 69.0 in | Wt 192.5 lb

## 2014-01-15 DIAGNOSIS — E119 Type 2 diabetes mellitus without complications: Secondary | ICD-10-CM

## 2014-01-15 DIAGNOSIS — I4891 Unspecified atrial fibrillation: Secondary | ICD-10-CM

## 2014-01-15 DIAGNOSIS — Z7901 Long term (current) use of anticoagulants: Secondary | ICD-10-CM

## 2014-01-15 DIAGNOSIS — F1010 Alcohol abuse, uncomplicated: Secondary | ICD-10-CM

## 2014-01-15 DIAGNOSIS — I420 Dilated cardiomyopathy: Secondary | ICD-10-CM

## 2014-01-15 DIAGNOSIS — I428 Other cardiomyopathies: Secondary | ICD-10-CM

## 2014-01-15 DIAGNOSIS — Z5181 Encounter for therapeutic drug level monitoring: Secondary | ICD-10-CM

## 2014-01-15 DIAGNOSIS — E785 Hyperlipidemia, unspecified: Secondary | ICD-10-CM

## 2014-01-15 DIAGNOSIS — I2510 Atherosclerotic heart disease of native coronary artery without angina pectoris: Secondary | ICD-10-CM

## 2014-01-15 DIAGNOSIS — I10 Essential (primary) hypertension: Secondary | ICD-10-CM

## 2014-01-15 LAB — POCT INR: INR: 1.9

## 2014-01-15 NOTE — Assessment & Plan Note (Addendum)
Long discussion about his cardiomyopathy today. Etiology is not clear. It is nonischemic, possibly exacerbated by alcohol and atrial fibrillation/flutter. Secondary to low ejection fraction, we have referred him to Dr. Caryl Comes to discuss  possible defibrillator placement. Prior ejection fraction estimated at 20-25%. We'll continue current medication, recommended alcohol cessation

## 2014-01-15 NOTE — Progress Notes (Signed)
Patient ID: Benjamin Soto, male    DOB: [birth year], 78 y.o.   MRN: [id number]  HPI Comments: Mr. Didonato is a pleasant 78 year old gentleman with a history of chronic atrial fibrillation, long smoking hx, heavy ETOH hx, remote history of SVT, with severe arthritis in his knees,  on warfarin ,  with a history of diabetes, hyperlipidemia, who presents for routine followup, He has nonischemic cardiomyopathy, cardiac catheterization in May 2014 showing nonobstructive CAD, ejection fraction 30-35% Echocardiogram with ejection fraction 20-25% History low testosterone in the past, is not on any supplement Previous recommendations to stop drinking alcohol. He has found this difficult to do.  Lisinopril was held previously secondary to low blood pressures and dizziness.  Recent evaluation in the clinic for CHF, acute systolic symptoms. Started on Lasix 20 mg daily for several days now every other day. In followup today he has a 8 pound weight loss and feels better. Shortness of breath has resolved. Wife reports he is back to normal  In the past, he would drink wine on a regular basis, sometimes in heavy amounts.  History of falls, mechanical fall over the summer 2014  on concrete  Previous episode of confusion, amnesia. Wife witnessed episode 08/09/2013. She felt he was having a stroke. 911 was called and he was transported to [facility].  Carotid ultrasound showed mild bilateral carotid disease. INR at the time was greater than 2. CT scan of the brain, MRI of the brain showed diffuse cortical atrophy, chronic ischemic white matter disease TIA or stroke. EKG 08/09/2013 showing atrial fibrillation with rate 130 beats per minute. He attributed everything to scalp resection procedure done twice with dermatology prior to the event. He had significant pain around his head prior to the episode. Significant oozing from the wound site. This was fixed on repeat surgery, scraping with additional stitches  placed.  Echocardiogram was repeated showing ejection fraction 20-25%, moderate mitral regurgitation, moderate LVH  cholesterol is well controlled.  CT Scan of his abdomen  showed coronary calcifications.   chest x-ray showed calcified granuloma in the right lower lobe which was not seen in 2012  Labs show total cholesterol less than 100, LDL in the 40 range   EKG shows atrial flutter with ventricular rate 81 beats per minute, right bundle branch block, left anterior fascicular block   Outpatient Encounter Prescriptions as of 01/15/2014  Medication Sig  . albuterol (PROVENTIL HFA;VENTOLIN HFA) 108 (90 BASE) MCG/ACT inhaler Inhale 2 puffs into the lungs every 6 (six) hours as needed for wheezing.  Marland Kitchen allopurinol (ZYLOPRIM) 100 MG tablet Take 100 mg by mouth daily.  Marland Kitchen allopurinol (ZYLOPRIM) 300 MG tablet Take 1 tablet (300 mg total) by mouth daily.  . brimonidine (ALPHAGAN P) 0.1 % SOLN Place 1 drop into both eyes 2 (two) times daily.  . carvedilol (COREG) 12.5 MG tablet Take 1 tablet (12.5 mg total) by mouth 2 (two) times daily with a meal.  . furosemide (LASIX) 40 MG tablet Take 1 tablet (40 mg total) by mouth every other day.  Marland Kitchen glucose blood (ONE TOUCH TEST STRIPS) test strip Use once daily  . losartan (COZAAR) 25 MG tablet Take 1 tablet (25 mg total) by mouth daily.  . metFORMIN (GLUCOPHAGE) 500 MG tablet Take 1 tablet (500 mg total) by mouth daily with breakfast.  . simvastatin (ZOCOR) 20 MG tablet Take 1 tablet (20 mg total) by mouth daily.  Marland Kitchen warfarin (COUMADIN) 5 MG tablet Take 2.5mg  on Mon, Wed, and Fri. Take 5mg   all other days as directed     Review of Systems  Constitutional: Negative.   HENT: Negative.   Eyes: Negative.   Respiratory: Positive for shortness of breath.   Cardiovascular: Negative.   Gastrointestinal: Negative.   Endocrine: Negative.   Musculoskeletal: Negative.   Skin: Negative.   Allergic/Immunologic: Negative.   Neurological: Negative.    Hematological: Negative.   Psychiatric/Behavioral: Negative.   All other systems reviewed and are negative.  BP 110/72  Pulse 81  Ht 5\' 9"  (1.753 m)  Wt 192 lb 8 oz (87.317 kg)  BMI 28.41 kg/m2  Physical Exam  Nursing note and vitals reviewed. Constitutional: He is oriented to person, place, and time. He appears well-developed and well-nourished.  HENT:  Head: Normocephalic.  Nose: Nose normal.  Mouth/Throat: Oropharynx is clear and moist.  Eyes: Conjunctivae are normal. Pupils are equal, round, and reactive to light.  Neck: Normal range of motion. Neck supple. No JVD present.  Cardiovascular: Normal rate, S1 normal, S2 normal, normal heart sounds and intact distal pulses.  An irregularly irregular rhythm present. Exam reveals no gallop and no friction rub.   No murmur heard. Pulmonary/Chest: Effort normal. No respiratory distress. He has decreased breath sounds. He has no wheezes. He has no rales. He exhibits no tenderness.  Abdominal: Soft. Bowel sounds are normal. He exhibits no distension. There is no tenderness.  Musculoskeletal: Normal range of motion. He exhibits no edema and no tenderness.  Lymphadenopathy:    He has no cervical adenopathy.  Neurological: He is alert and oriented to person, place, and time. Coordination normal.  Skin: Skin is warm and dry. No rash noted. No erythema.  Psychiatric: He has a normal mood and affect. His behavior is normal. Judgment and thought content normal.      Assessment and Plan

## 2014-01-15 NOTE — Assessment & Plan Note (Signed)
Heart rate relatively well-controlled. Tolerating warfarin. No recent TIA-type symptoms.

## 2014-01-15 NOTE — Patient Instructions (Addendum)
You are doing well. No medication changes were made.  We will make an appt with Dr. Caryl Comes  Please call us if you have new issues that need to be addressed before your next appt.  Your physician wants you to follow-up in: 6 months.  You will receive a reminder letter in the mail two months in advance. If you don't receive a letter, please call our office to schedule the follow-up appointment.

## 2014-01-15 NOTE — Assessment & Plan Note (Signed)
Blood pressure is well controlled on today's visit. No changes made to the medications. 

## 2014-01-15 NOTE — Assessment & Plan Note (Signed)
Encouraged him to stay on his simvastatin. 

## 2014-01-15 NOTE — Assessment & Plan Note (Signed)
We have encouraged continued exercise, careful diet management in an effort to lose weight. 

## 2014-01-15 NOTE — Assessment & Plan Note (Addendum)
Recommended alcohol cessation. Unable to exclude alcohol cardiomyopathy

## 2014-02-06 ENCOUNTER — Ambulatory Visit: Payer: Medicare Other | Admitting: Internal Medicine

## 2014-02-15 ENCOUNTER — Ambulatory Visit (INDEPENDENT_AMBULATORY_CARE_PROVIDER_SITE_OTHER): Payer: Medicare Other | Admitting: Family Medicine

## 2014-02-15 DIAGNOSIS — I4891 Unspecified atrial fibrillation: Secondary | ICD-10-CM

## 2014-02-15 DIAGNOSIS — Z5181 Encounter for therapeutic drug level monitoring: Secondary | ICD-10-CM

## 2014-02-15 DIAGNOSIS — Z7901 Long term (current) use of anticoagulants: Secondary | ICD-10-CM

## 2014-02-15 LAB — POCT INR: INR: 1.4

## 2014-02-20 ENCOUNTER — Ambulatory Visit: Payer: Medicare Other | Admitting: Cardiovascular Disease

## 2014-02-28 ENCOUNTER — Encounter: Payer: Self-pay | Admitting: Internal Medicine

## 2014-02-28 ENCOUNTER — Ambulatory Visit (INDEPENDENT_AMBULATORY_CARE_PROVIDER_SITE_OTHER): Payer: Medicare Other | Admitting: Internal Medicine

## 2014-02-28 VITALS — BP 102/84 | HR 65 | Ht 69.0 in | Wt 190.2 lb

## 2014-02-28 DIAGNOSIS — I4891 Unspecified atrial fibrillation: Secondary | ICD-10-CM

## 2014-02-28 DIAGNOSIS — I429 Cardiomyopathy, unspecified: Secondary | ICD-10-CM

## 2014-02-28 DIAGNOSIS — I428 Other cardiomyopathies: Secondary | ICD-10-CM

## 2014-02-28 NOTE — Progress Notes (Signed)
.sk[facility] seborrheic keratosis    ELECTROPHYSIOLOGY CONSULT NOTE  Patient ID: Benjamin Soto, MRN: [id number], DOB/AGE: 76-Apr-1937 76 y.o. Admit date: (Not on file) Date of Consult: 02/28/2014  Primary Physician: Elsie Stain, MD Primary Cardiologist: TG  Chief Complaint:  reeferred for consideration of ICD   HPI Benjamin Soto is a 78 y.o. male  Referred for consideration of an ICD.  His history of a nonischemic cardiomyopathy confirmed by catheterization 2014. Ejection fraction is measured variably from 20-30%.And most recently November 2014 at 20-25% with moderate mitral regurgitation and biatrial enlargement He has permanent atrial fibrillation.  He is on warfarin anticoagulation.    Is a history of heavy ethanol use; this apparently has continued but is now down to one or 2 glasses of wine per day. He no longer smokes.  He has modest exercise intolerance. He is a little bit of shortness of breath at the top of a flight of stairs.  He denies palpitations. He has had an episode of syncope upon arising from a car. He does have some orthostatic intolerance.  He had a spell of transient global amnesia  He is poorly able to clarify his past medical history or the indication for this visit.    Past Medical History  Diagnosis Date  . Gout 04/22/2003    per Dr Jefm Bryant  . Hyperlipidemia 05/22/01  . Knee pain, right     [facility], Dr Jefm Bryant with rheumatology  . History of colon polyps 10/26/02    colonoscopy  . Hypogonadism male   . Elbow fracture, right 2014    "healed by itself"  . Permanent atrial fibrillation 01/20/2003  . Diabetes mellitus, type 2 09/21/92  . History of GI bleed 09/12/2013    Seen by GI at [facility]- felt to be diverticular in origin. Resolved off Coumadin. Cleared to resume per GI.  Marland Kitchen ETOH abuse   . Nonischemic cardiomyopathy     EF 20-25% by 07/2013 echo  . CAD (coronary artery disease)     Nonobstructive by 06/2013 cath  . Anticoagulated on  Coumadin   . History of tobacco abuse       Surgical History:  Past Surgical History  Procedure Laterality Date  . Bunionectomy Right 02/2004    R MTP  . Cardiac catheterization  1995    [facility]  . Cardiac catheterization  10/14    [facility]- 50% mid LCx, 40% prox RCA  . Cyst excision  08/07/2013    head  . Appendectomy    . Tonsillectomy       Home Meds: Prior to Admission medications   Medication Sig Start Date End Date Taking? Authorizing Provider  albuterol (PROVENTIL HFA;VENTOLIN HFA) 108 (90 BASE) MCG/ACT inhaler Inhale 2 puffs into the lungs every 6 (six) hours as needed for wheezing. 05/05/13  Yes Tonia Ghent, MD  allopurinol (ZYLOPRIM) 100 MG tablet Take 100 mg by mouth daily.   Yes Tonia Ghent, MD  allopurinol (ZYLOPRIM) 300 MG tablet Take 1 tablet (300 mg total) by mouth daily.   Yes Tonia Ghent, MD  brimonidine (ALPHAGAN P) 0.1 % SOLN Place 1 drop into both eyes 2 (two) times daily.   Yes Historical Provider, MD  carvedilol (COREG) 12.5 MG tablet Take 1 tablet (12.5 mg total) by mouth 2 (two) times daily with a meal. 11/02/13  Yes Tonia Ghent, MD  furosemide (LASIX) 40 MG tablet Take 1 tablet (40 mg total) by mouth every other day. 12/15/13  Yes Roger A Arguello, PA-C  glucose blood (ONE TOUCH TEST STRIPS) test strip Use once daily 11/02/13  Yes Tonia Ghent, MD  losartan (COZAAR) 25 MG tablet Take 1 tablet (25 mg total) by mouth daily. 12/15/13  Yes Roger A Arguello, PA-C  metFORMIN (GLUCOPHAGE) 500 MG tablet Take 1 tablet (500 mg total) by mouth daily with breakfast. 11/09/13  Yes Tonia Ghent, MD  simvastatin (ZOCOR) 20 MG tablet Take 1 tablet (20 mg total) by mouth daily. 11/02/13  Yes Tonia Ghent, MD  warfarin (COUMADIN) 5 MG tablet Take 2.5mg  on Mon, Wed, and Fri. Take 5mg  all other days as directed 11/02/13  Yes Tonia Ghent, MD      Allergies:  Allergies  Allergen Reactions  . Aspirin     GI bleed  . Lisinopril      REACTION: hyperkalemia at high dose  . Varenicline Tartrate     REACTION: vivid dreams, nausea    History   Social History  . Marital Status: Married    Spouse Name: N/A    Number of Children: 2  . Years of Education: N/A   Occupational History  . 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 Smoking status: Former Smoker -- 0.25 packs/day for 44 years    Types: Cigarettes, Pipe    Quit date: 08/09/2013  . Smokeless tobacco: Never Used  . Alcohol Use: 2.4 oz/week    4 Glasses of wine per week     Comment: 09/12/2013 "drinks 1-2 glasses of red wine daily, 2-3 times/wk"  . Drug Use: No  . Sexual Activity: Not Currently   Other Topics Concern  . Not on file   Social History Narrative   Married since 1965.  They have two healthy grown children.   Former smoker, quit 1995, 35 pyh, started back as of 2011 with pipe smoking     Family History  Problem Relation Age of Onset  . Diabetes Father   . Other Mother     Deceased  . Prostate cancer Neg Hx   . Healthy Son   . Colon cancer Daughter     Cancer free     ROS:  Please see the history of present illness.     All other systems reviewed and negative.    Physical Exam:   Blood pressure 102/84, pulse 65, height 5\' 9"  (1.753 m), weight 190 lb 4 oz (86.297 kg). General: Well developed, well nourished male in no acute distress. Head: Normocephalic, atraumatic, sclera non-icteric, no xanthomas, nares are without discharge. EENT: normal Lymph Nodes:  none Back: without scoliosis/kyphosis , no CVA tendersness Neck: Negative for carotid bruits. JVD not elevated. Lungs: Clear bilaterally to auscultation without wheezes, rales, or rhonchi. Breathing is unlabored. Heart: irregularly irregular rhythm with a normal S2 and a diminished S1 2/6 systolic murmur , rubs, or gallops appreciated. Abdomen: Soft, non-tender, non-distended with normoactive bowel  sounds. No hepatomegaly. No rebound/guarding. No obvious abdominal masses. Msk:  Strength and tone appear normal for age. Extremities: No clubbing or cyanosis. No edema.  Distal pedal pulses are 2+ and equal bilaterally. Skin: Warm and Dry Neuro: Alert and oriented X 3. CN III-XII intact Grossly normal sensory and motor function . Psych:  Responds to questions appropriately with a normal affect.      Labs: Cardiac Enzymes No results found for this basename: CKTOTAL, CKMB, TROPONINI,  in the last  72 hours CBC Lab Results  Component Value Date   WBC 4.3* 10/02/2013   HGB 11.4* 10/02/2013   HCT 34.5* 10/02/2013   MCV 95.4 10/02/2013   PLT 187.0 10/02/2013   PROTIME: No results found for this basename: LABPROT, INR,  in the last 72 hours Chemistry No results found for this basename: NA, K, CL, CO2, BUN, CREATININE, CALCIUM, LABALBU, PROT, BILITOT, ALKPHOS, ALT, AST, GLUCOSE,  in the last 168 hours Lipids Lab Results  Component Value Date   CHOL 118 08/01/2012   HDL 47.20 08/01/2012   LDLCALC 49 08/01/2012   TRIG 108.0 08/01/2012   BNP Pro B Natriuretic peptide (BNP)  Date/Time Value Ref Range Status  10/07/2011  8:23 AM 1663.0* 0.0 - 100.0 pg/mL Final   Miscellaneous No results found for this basename: DDIMER    Radiology/Studies:  No results found.  EKG: atrial fibrillation with right bundle branch block  Assessment and Plan:   Nonischemic cardiomyopathy  Atrial fibrillation-permanent  Congestive heart failure-chronic-systolic currently class II  Mitral regurgitation-moderate  EtOH use/abuse  The patient presents for consideration for an ICD. Most recent evaluation of LV function at November 2014. There has beensignificant interval improvement in functional status as well as the use of guideline directed medical therapy with beta blockers and ARB. It is most appropriate initially to reassess left ventricular function.  In the event that left ventricular function  remains depressed, ICD implantation for primary prevention would be a reasonable consideration. Would be a class 2 indication for consideration of LV lead placement  I suspect mitral regurgitation secondary to chamber dilatation. There has been interval improvement in cardiac function, mitral regurgitation may well also have decreased.  We spent about 30-45 minutes reviewing the aforementioned issues related to congestive heart failure as well as the decision tree for consideration of ICD implantation.     Virl Axe

## 2014-02-28 NOTE — Patient Instructions (Signed)
Your physician has requested that you have an echocardiogram. Echocardiography is a painless test that uses sound waves to create images of your heart. It provides your doctor with information about the size and shape of your heart and how well your heart's chambers and valves are working. This procedure takes approximately one hour. There are no restrictions for this procedure.  Your physician recommends that you continue on your current medications as directed. Please refer to the Current Medication list given to you today.  Your physician recommends that you schedule a follow-up appointment in:  We will plan this after the Echo

## 2014-03-15 ENCOUNTER — Ambulatory Visit (INDEPENDENT_AMBULATORY_CARE_PROVIDER_SITE_OTHER): Payer: Medicare Other | Admitting: Family Medicine

## 2014-03-15 DIAGNOSIS — Z5181 Encounter for therapeutic drug level monitoring: Secondary | ICD-10-CM

## 2014-03-15 DIAGNOSIS — Z7901 Long term (current) use of anticoagulants: Secondary | ICD-10-CM

## 2014-03-15 DIAGNOSIS — I4891 Unspecified atrial fibrillation: Secondary | ICD-10-CM

## 2014-03-15 LAB — POCT INR: INR: 1.3

## 2014-03-16 ENCOUNTER — Other Ambulatory Visit: Payer: Self-pay

## 2014-03-16 ENCOUNTER — Other Ambulatory Visit (INDEPENDENT_AMBULATORY_CARE_PROVIDER_SITE_OTHER): Payer: Medicare Other

## 2014-03-16 DIAGNOSIS — I4891 Unspecified atrial fibrillation: Secondary | ICD-10-CM

## 2014-03-16 DIAGNOSIS — I059 Rheumatic mitral valve disease, unspecified: Secondary | ICD-10-CM

## 2014-03-16 DIAGNOSIS — I428 Other cardiomyopathies: Secondary | ICD-10-CM

## 2014-03-16 DIAGNOSIS — I359 Nonrheumatic aortic valve disorder, unspecified: Secondary | ICD-10-CM

## 2014-03-16 DIAGNOSIS — I429 Cardiomyopathy, unspecified: Secondary | ICD-10-CM

## 2014-04-03 ENCOUNTER — Ambulatory Visit (INDEPENDENT_AMBULATORY_CARE_PROVIDER_SITE_OTHER): Payer: Medicare Other | Admitting: Internal Medicine

## 2014-04-03 ENCOUNTER — Telehealth: Payer: Self-pay | Admitting: *Deleted

## 2014-04-03 ENCOUNTER — Encounter: Payer: Self-pay | Admitting: Internal Medicine

## 2014-04-03 ENCOUNTER — Other Ambulatory Visit (HOSPITAL_COMMUNITY): Payer: Self-pay | Admitting: Internal Medicine

## 2014-04-03 VITALS — BP 108/71 | HR 72 | Ht 69.0 in | Wt 188.2 lb

## 2014-04-03 DIAGNOSIS — I5022 Chronic systolic (congestive) heart failure: Secondary | ICD-10-CM

## 2014-04-03 DIAGNOSIS — I5020 Unspecified systolic (congestive) heart failure: Secondary | ICD-10-CM

## 2014-04-03 DIAGNOSIS — I4891 Unspecified atrial fibrillation: Secondary | ICD-10-CM

## 2014-04-03 DIAGNOSIS — I420 Dilated cardiomyopathy: Secondary | ICD-10-CM

## 2014-04-03 DIAGNOSIS — I428 Other cardiomyopathies: Secondary | ICD-10-CM

## 2014-04-03 DIAGNOSIS — I509 Heart failure, unspecified: Secondary | ICD-10-CM

## 2014-04-03 NOTE — Telephone Encounter (Signed)
Mrs. Carnell called and stated that  Melissa with  Stress test in [place] called her.

## 2014-04-03 NOTE — Patient Instructions (Addendum)
Your physician has recommended that you have a cardiopulmonary stress test (CPX). CPX testing is a non-invasive measurement of heart and lung function. It replaces a traditional treadmill stress test. This type of test provides a tremendous amount of information that relates not only to your present condition but also for future outcomes. This test combines measurements of you ventilation, respiratory gas exchange in the lungs, electrocardiogram (EKG), blood pressure and physical response before, during, and following an exercise protocol.  Our office will call you with a date and time.

## 2014-04-03 NOTE — Telephone Encounter (Signed)
CPX scheduled with Melissa  Patient is aware

## 2014-04-03 NOTE — Progress Notes (Signed)
Patient Care Team: Tonia Ghent, MD as PCP - General (Family Medicine)   HPI  Benjamin Soto is a 78 y.o. male Seen in followup after initial referral for consideration of ICD implantation. There have been no interval significant improvement in functional status a repeat echocardiogram in the last month demonstrated persistent left ventricular dysfunction with an ejection fraction of 20-25%.  He comes in today to discuss ICD implantation.  Is able to climb a flight of stairs; he says his major limitation is his knees. His wife notes that he cannot keep up with her.  His history of a nonischemic cardiomyopathy confirmed by catheterization 2014. Ejection fraction is measured variably from 20-30%.And most recently November 2014 at 20-25% with moderate mitral regurgitation and biatrial enlargement  He has permanent atrial fibrillation. He is on warfarin anticoagulation.     Past Medical History  Diagnosis Date  . Gout 04/22/2003    per Dr Jefm Bryant  . Hyperlipidemia 05/22/01  . Knee pain, right     [facility], Dr Jefm Bryant with rheumatology  . History of colon polyps 10/26/02    colonoscopy  . Hypogonadism male   . Elbow fracture, right 2014    "healed by itself"  . Permanent atrial fibrillation 01/20/2003  . Diabetes mellitus, type 2 09/21/92  . History of GI bleed 09/12/2013    Seen by GI at [facility]- felt to be diverticular in origin. Resolved off Coumadin. Cleared to resume per GI.  Marland Kitchen ETOH abuse   . Nonischemic cardiomyopathy     EF 20-25% by 07/2013 echo  . CAD (coronary artery disease)     Nonobstructive by 06/2013 cath  . Anticoagulated on Coumadin   . History of tobacco abuse     Past Surgical History  Procedure Laterality Date  . Bunionectomy Right 02/2004    R MTP  . Cardiac catheterization  1995    normal, [facility] by [facility]  . Cardiac catheterization  10/14    [facility]- 50% mid LCx, 40% prox RCA  . Cyst excision  08/07/2013    head  . Appendectomy     . Tonsillectomy      Current Outpatient Prescriptions  Medication Sig Dispense Refill  . albuterol (PROVENTIL HFA;VENTOLIN HFA) 108 (90 BASE) MCG/ACT inhaler Inhale 2 puffs into the lungs every 6 (six) hours as needed for wheezing.  1 Inhaler  0  . allopurinol (ZYLOPRIM) 100 MG tablet Take 100 mg by mouth daily.      Marland Kitchen allopurinol (ZYLOPRIM) 300 MG tablet Take 1 tablet (300 mg total) by mouth daily.  90 tablet  3  . brimonidine (ALPHAGAN P) 0.1 % SOLN Place 1 drop into both eyes 2 (two) times daily.      . carvedilol (COREG) 12.5 MG tablet Take 1 tablet (12.5 mg total) by mouth 2 (two) times daily with a meal.  180 tablet  3  . furosemide (LASIX) 40 MG tablet Take 1 tablet (40 mg total) by mouth every other day.  30 tablet  5  . glucose blood (ONE TOUCH TEST STRIPS) test strip Use once daily  100 each  3  . losartan (COZAAR) 25 MG tablet Take 1 tablet (25 mg total) by mouth daily.  30 tablet  3  . metFORMIN (GLUCOPHAGE) 500 MG tablet Take 1 tablet (500 mg total) by mouth daily with breakfast.      . simvastatin (ZOCOR) 20 MG tablet Take 1 tablet (20 mg total) by mouth daily.  90 tablet  3  . warfarin (COUMADIN) 5 MG tablet Take 2.5mg  on Mon, Wed, and Fri. Take 5mg  all other days as directed  100 tablet  3   No current facility-administered medications for this visit.    Allergies  Allergen Reactions  . Aspirin     GI bleed  . Lisinopril     REACTION: hyperkalemia at high dose  . Varenicline Tartrate     REACTION: vivid dreams, nausea    Review of Systems negative except from HPI and PMH  Physical Exam BP 108/71  Pulse 72  Ht 5\' 9"  (1.753 m)  Wt 188 lb 4 oz (85.39 kg)  BMI 27.79 kg/m2 Well developed and well nourished in no acute distress HENT normal E scleral and icterus clear Neck Supple JVP flat; carotids brisk and full Clear to ausculation  Regular rate and rhythm, no murmurs gallops or rub Soft with active bowel sounds No clubbing cyanosis  Edema Alert and  oriented, grossly normal motor and sensory function Skin Warm and Dry  ECG demonstrates atrial fibrillation at 72 Intervals-/15/46 Right bundle branch left axis deviation  Assessment and  Plan  Atrial fibrillation-permanent  Nonischemic cardiac myopathy  Congestive heart failure-class II?  The patient has minimal functional limitations procedure as he attributes to his knees. It Is not clear whether he has limited himself or whether they're cardiac in nature. We discussed this issue for more than half an hour. We'll plan to undertake cardiopulmonary stress testing significant objective we assessed functional status and proceed from there. For now we will continue him on guideline directed medical therapy

## 2014-04-04 ENCOUNTER — Other Ambulatory Visit (INDEPENDENT_AMBULATORY_CARE_PROVIDER_SITE_OTHER): Payer: Medicare Other

## 2014-04-04 DIAGNOSIS — I10 Essential (primary) hypertension: Secondary | ICD-10-CM

## 2014-04-04 DIAGNOSIS — E119 Type 2 diabetes mellitus without complications: Secondary | ICD-10-CM

## 2014-04-04 LAB — LIPID PANEL
Cholesterol: 136 mg/dL (ref 0–200)
HDL: 46.3 mg/dL (ref >39.00)
LDL Cholesterol: 73 mg/dL (ref 0–99)
NonHDL: 89.7
Total CHOL/HDL Ratio: 3
Triglycerides: 86 mg/dL (ref 0.0–149.0)
VLDL: 17.2 mg/dL (ref 0.0–40.0)

## 2014-04-04 LAB — HEMOGLOBIN A1C: Hgb A1c MFr Bld: 6.5 % (ref 4.6–6.5)

## 2014-04-06 ENCOUNTER — Encounter: Payer: Self-pay | Admitting: *Deleted

## 2014-04-12 ENCOUNTER — Ambulatory Visit (INDEPENDENT_AMBULATORY_CARE_PROVIDER_SITE_OTHER): Payer: Medicare Other | Admitting: Family Medicine

## 2014-04-12 DIAGNOSIS — Z5181 Encounter for therapeutic drug level monitoring: Secondary | ICD-10-CM

## 2014-04-12 DIAGNOSIS — I4891 Unspecified atrial fibrillation: Secondary | ICD-10-CM

## 2014-04-12 DIAGNOSIS — Z7901 Long term (current) use of anticoagulants: Secondary | ICD-10-CM

## 2014-04-12 LAB — POCT INR: INR: 1.3

## 2014-04-17 ENCOUNTER — Telehealth: Payer: Self-pay

## 2014-04-17 ENCOUNTER — Other Ambulatory Visit: Payer: Self-pay

## 2014-04-17 ENCOUNTER — Encounter (HOSPITAL_COMMUNITY): Payer: Medicare Other

## 2014-04-17 MED ORDER — LOSARTAN POTASSIUM 25 MG PO TABS: 25.0000 mg | ORAL_TABLET | Freq: Every day | ORAL | Status: DC

## 2014-04-17 NOTE — Telephone Encounter (Signed)
Patient's wife advised

## 2014-04-17 NOTE — Telephone Encounter (Signed)
It's still okay at 6.5. I wouldn't take extra medicine at this point. Thanks.

## 2014-04-17 NOTE — Telephone Encounter (Signed)
Benjamin Soto received lab results in mail and Benjamin Soto understood results but she wants to know since A1C did go up since decreasing metformin from 2 pills a day to 1 pill a day if metformin should be increased again.Please advise.

## 2014-05-03 ENCOUNTER — Ambulatory Visit (INDEPENDENT_AMBULATORY_CARE_PROVIDER_SITE_OTHER): Payer: Medicare Other | Admitting: Family Medicine

## 2014-05-03 DIAGNOSIS — I4891 Unspecified atrial fibrillation: Secondary | ICD-10-CM

## 2014-05-03 DIAGNOSIS — Z5181 Encounter for therapeutic drug level monitoring: Secondary | ICD-10-CM

## 2014-05-03 DIAGNOSIS — Z7901 Long term (current) use of anticoagulants: Secondary | ICD-10-CM

## 2014-05-03 LAB — POCT INR: INR: 1.8

## 2014-05-09 ENCOUNTER — Ambulatory Visit (HOSPITAL_COMMUNITY): Payer: Medicare Other | Attending: Internal Medicine

## 2014-05-09 DIAGNOSIS — I509 Heart failure, unspecified: Secondary | ICD-10-CM | POA: Diagnosis present

## 2014-05-29 ENCOUNTER — Encounter: Payer: Self-pay | Admitting: Internal Medicine

## 2014-05-29 ENCOUNTER — Ambulatory Visit (INDEPENDENT_AMBULATORY_CARE_PROVIDER_SITE_OTHER): Payer: Medicare Other | Admitting: Internal Medicine

## 2014-05-29 VITALS — BP 92/50 | HR 71 | Ht 69.0 in | Wt 191.2 lb

## 2014-05-29 DIAGNOSIS — I4891 Unspecified atrial fibrillation: Secondary | ICD-10-CM

## 2014-05-29 MED ORDER — DIGOXIN 125 MCG PO TABS: 0.1250 mg | ORAL_TABLET | Freq: Every day | ORAL | Status: DC

## 2014-05-29 NOTE — Progress Notes (Signed)
Patient Care Team: Tonia Ghent, MD as PCP - General (Family Medicine)   HPI  Benjamin Soto is a 78 y.o. male Seen in followup after initial referral for consideration of ICD implantation. There have been no interval significant improvement in functional status a repeat echocardiogram in the last month demonstrated persistent left ventricular dysfunction with an ejection fraction of 20-25%.    Is able to climb a flight of stairs; he says his major limitation is his knees. His wife notes that he cannot keep up with her.  His history of a nonischemic cardiomyopathy confirmed by catheterization 2014. Ejection fraction is measured variably from 20-30%.And most recently November 2014 at 20-25% with moderate mitral regurgitation and biatrial enlargement  He has permanent atrial fibrillation. He is on warfarin anticoagulation.   He underwent cardiopulmonary stress testing demonstrated a marked circulatory limitation with a VO2 max of 13.8 this is also associated with a somewhat brisk    heart rate response with a peak heart rate of 159    Past Medical History  Diagnosis Date  . Gout 04/22/2003    per Dr Jefm Bryant  . Hyperlipidemia 05/22/01  . Knee pain, right     [facility], Dr Jefm Bryant with rheumatology  . History of colon polyps 10/26/02    colonoscopy  . Hypogonadism male   . Elbow fracture, right 2014    "healed by itself"  . Permanent atrial fibrillation 01/20/2003  . Diabetes mellitus, type 2 09/21/92  . History of GI bleed 09/12/2013    Seen by GI at [facility]- felt to be diverticular in origin. Resolved off Coumadin. Cleared to resume per GI.  Marland Kitchen ETOH abuse   . Nonischemic cardiomyopathy     EF 20-25% by 07/2013 echo  . CAD (coronary artery disease)     Nonobstructive by 06/2013 cath  . Anticoagulated on Coumadin   . History of tobacco abuse     Past Surgical History  Procedure Laterality Date  . Bunionectomy Right 02/2004    R MTP  . Cardiac catheterization   1995    [facility]  . Cardiac catheterization  10/14    [facility]- 50% mid LCx, 40% prox RCA  . Cyst excision  08/07/2013    head  . Appendectomy    . Tonsillectomy      Current Outpatient Prescriptions  Medication Sig Dispense Refill  . albuterol (PROVENTIL HFA;VENTOLIN HFA) 108 (90 BASE) MCG/ACT inhaler Inhale 2 puffs into the lungs every 6 (six) hours as needed for wheezing.  1 Inhaler  0  . allopurinol (ZYLOPRIM) 100 MG tablet Take 100 mg by mouth daily.      Marland Kitchen allopurinol (ZYLOPRIM) 300 MG tablet Take 1 tablet (300 mg total) by mouth daily.  90 tablet  3  . brimonidine (ALPHAGAN P) 0.1 % SOLN Place 1 drop into both eyes 2 (two) times daily.      . carvedilol (COREG) 12.5 MG tablet Take 1 tablet (12.5 mg total) by mouth 2 (two) times daily with a meal.  180 tablet  3  . furosemide (LASIX) 40 MG tablet Take 1 tablet (40 mg total) by mouth every other day.  30 tablet  5  . glucose blood (ONE TOUCH TEST STRIPS) test strip Use once daily  100 each  3  . losartan (COZAAR) 25 MG tablet Take 1 tablet (25 mg total) by mouth daily.  30 tablet  3  . metFORMIN (GLUCOPHAGE) 500 MG tablet Take  1 tablet (500 mg total) by mouth daily with breakfast.      . simvastatin (ZOCOR) 20 MG tablet Take 1 tablet (20 mg total) by mouth daily.  90 tablet  3  . warfarin (COUMADIN) 5 MG tablet Take 2.5mg  on Mon, Wed, and Fri. Take 5mg  all other days as directed  100 tablet  3   No current facility-administered medications for this visit.    Allergies  Allergen Reactions  . Aspirin     GI bleed  . Lisinopril     REACTION: hyperkalemia at high dose  . Varenicline Tartrate     REACTION: vivid dreams, nausea    Review of Systems negative except from HPI and PMH  Physical Exam BP 92/50  Pulse 71  Ht 5\' 9"  (1.753 m)  Wt 191 lb 4 oz (86.75 kg)  BMI 28.23 kg/m2 Well developed and well nourished in no acute distress HENT normal E scleral and icterus clear Neck Supple JVP flat; carotids  brisk and full Clear to ausculation  Regular rate and rhythm, no murmurs gallops or rub Soft with active bowel sounds No clubbing cyanosis  Edema Alert and oriented, grossly normal motor and sensory function Skin Warm and Dry  ECG demonstrates atrial fibrillation at 72 Intervals-/15/46 Right bundle branch left axis deviation  Assessment and  Plan  Atrial fibrillation-permanent  Nonischemic cardiac myopathy  Congestive heart failure-class II?   We undertook cardiopulmonary stress testing which demonstrated a far more significant limitation than that which he describes. His VO2 max 13.8. There ws some excess   his heart rate response >>158  We discussed the implications realated to ICD implantation which they are disinclined to pursue  Will add dig Arrange dig level F/u TG 2 m And SK 3m

## 2014-05-29 NOTE — Patient Instructions (Signed)
Your physician has recommended you make the following change in your medication:  Start Digoxin 0.125 mg once daily   Your physician recommends that you return for lab work in:  Digoxin level in 3 weeks  Your physician recommends that you schedule a follow-up appointment in:  2 months with Dr. Rockey Situ   Your physician recommends that you schedule a follow-up appointment in:  4 months with Dr. Caryl Comes

## 2014-05-31 ENCOUNTER — Ambulatory Visit (INDEPENDENT_AMBULATORY_CARE_PROVIDER_SITE_OTHER): Payer: Medicare Other | Admitting: Family Medicine

## 2014-05-31 ENCOUNTER — Ambulatory Visit (INDEPENDENT_AMBULATORY_CARE_PROVIDER_SITE_OTHER): Payer: Medicare Other

## 2014-05-31 DIAGNOSIS — I4891 Unspecified atrial fibrillation: Secondary | ICD-10-CM

## 2014-05-31 DIAGNOSIS — Z23 Encounter for immunization: Secondary | ICD-10-CM

## 2014-05-31 DIAGNOSIS — Z5181 Encounter for therapeutic drug level monitoring: Secondary | ICD-10-CM

## 2014-05-31 DIAGNOSIS — Z7901 Long term (current) use of anticoagulants: Secondary | ICD-10-CM

## 2014-06-01 ENCOUNTER — Other Ambulatory Visit: Payer: Self-pay | Admitting: *Deleted

## 2014-06-01 MED ORDER — LOSARTAN POTASSIUM 25 MG PO TABS: 25.0000 mg | ORAL_TABLET | Freq: Every day | ORAL | Status: DC

## 2014-06-14 ENCOUNTER — Ambulatory Visit (INDEPENDENT_AMBULATORY_CARE_PROVIDER_SITE_OTHER): Payer: Medicare Other | Admitting: *Deleted

## 2014-06-14 DIAGNOSIS — I4891 Unspecified atrial fibrillation: Secondary | ICD-10-CM

## 2014-06-15 LAB — DIGOXIN LEVEL: Digoxin Level: 1.2 ng/mL (ref 0.9–2.0)

## 2014-06-15 NOTE — Progress Notes (Signed)
LVM 9/25

## 2014-06-20 ENCOUNTER — Telehealth: Payer: Self-pay | Admitting: *Deleted

## 2014-06-20 MED ORDER — DIGOXIN 125 MCG PO TABS: 0.0625 mg | ORAL_TABLET | Freq: Every day | ORAL | Status: DC

## 2014-06-20 NOTE — Telephone Encounter (Signed)
Message copied by Tracie Harrier on Wed Jun 20, 2014  4:35 PM ------      Message from: Stanton Kidney      Created: Fri Jun 15, 2014  3:01 PM                   ----- Message -----         From: Deboraha Sprang, MD         Sent: 06/15/2014  11:14 AM           To: Stanton Kidney, RN            Please Inform Patient that dig level is too high  Decrease dose 0.0625       Thanks       ------

## 2014-06-25 ENCOUNTER — Ambulatory Visit (INDEPENDENT_AMBULATORY_CARE_PROVIDER_SITE_OTHER): Payer: Medicare Other | Admitting: *Deleted

## 2014-06-25 DIAGNOSIS — I4891 Unspecified atrial fibrillation: Secondary | ICD-10-CM

## 2014-06-25 DIAGNOSIS — Z5181 Encounter for therapeutic drug level monitoring: Secondary | ICD-10-CM

## 2014-06-25 DIAGNOSIS — Z7901 Long term (current) use of anticoagulants: Secondary | ICD-10-CM

## 2014-06-25 LAB — POCT INR: INR: 2.7

## 2014-07-11 ENCOUNTER — Telehealth: Payer: Self-pay | Admitting: Internal Medicine

## 2014-07-11 ENCOUNTER — Other Ambulatory Visit: Payer: Self-pay | Admitting: *Deleted

## 2014-07-11 MED ORDER — DIGOXIN 0.0625 MG HALF TABLET: 0.0625 mg | ORAL_TABLET | Freq: Every day | ORAL | Status: DC

## 2014-07-11 NOTE — Telephone Encounter (Signed)
See note below; Digoxin does not come in 1/2 tablet for dose requested. Please advise

## 2014-07-11 NOTE — Telephone Encounter (Signed)
Pt is needing new rx because digoxin. 01.25 mg, tried to putting in half,and its just not working. Please call patient.and if you can call it to [address]

## 2014-07-11 NOTE — Telephone Encounter (Signed)
Rx sent  Digoxin 0.0625 mg tablet to [facility] on [address].

## 2014-07-16 NOTE — Telephone Encounter (Signed)
I am confused. According to his MAR he is not taking digoxin althoughit may have been removed fromf the list. Can you please call the patient and find out what does they are taking. Whatever dose he was taking when he had his blood work drawn last month as to be cut in half. It can be dosed at a lower dose, it can be dosed every other day, thanks

## 2014-07-17 NOTE — Telephone Encounter (Signed)
Instructed patient per Dr. Caryl Comes to stop Digoxin  Patient verbalized understanding

## 2014-08-06 ENCOUNTER — Ambulatory Visit: Payer: Medicare Other

## 2014-08-06 ENCOUNTER — Other Ambulatory Visit (INDEPENDENT_AMBULATORY_CARE_PROVIDER_SITE_OTHER): Payer: Medicare Other

## 2014-08-06 DIAGNOSIS — Z5181 Encounter for therapeutic drug level monitoring: Secondary | ICD-10-CM

## 2014-08-06 DIAGNOSIS — I4891 Unspecified atrial fibrillation: Secondary | ICD-10-CM

## 2014-08-06 DIAGNOSIS — Z7901 Long term (current) use of anticoagulants: Secondary | ICD-10-CM

## 2014-08-06 LAB — PROTIME-INR
INR: 2.6 ratio — ABNORMAL HIGH (ref 0.8–1.0)
Prothrombin Time: 28.1 s — ABNORMAL HIGH (ref 9.6–13.1)

## 2014-08-07 ENCOUNTER — Telehealth: Payer: Self-pay | Admitting: Family Medicine

## 2014-08-07 NOTE — Telephone Encounter (Signed)
Please call patient. If patient doesn't answer, please leave a detailed message on his answering machine.

## 2014-08-07 NOTE — Telephone Encounter (Signed)
Returned call and lab results given to patient.

## 2014-08-09 ENCOUNTER — Encounter: Payer: Self-pay | Admitting: Cardiovascular Disease

## 2014-08-09 ENCOUNTER — Ambulatory Visit (INDEPENDENT_AMBULATORY_CARE_PROVIDER_SITE_OTHER): Payer: Medicare Other | Admitting: Cardiovascular Disease

## 2014-08-09 VITALS — BP 110/60 | HR 72 | Ht 69.0 in | Wt 185.3 lb

## 2014-08-09 DIAGNOSIS — E785 Hyperlipidemia, unspecified: Secondary | ICD-10-CM

## 2014-08-09 DIAGNOSIS — I5022 Chronic systolic (congestive) heart failure: Secondary | ICD-10-CM

## 2014-08-09 DIAGNOSIS — F1010 Alcohol abuse, uncomplicated: Secondary | ICD-10-CM

## 2014-08-09 DIAGNOSIS — I420 Dilated cardiomyopathy: Secondary | ICD-10-CM

## 2014-08-09 DIAGNOSIS — I951 Orthostatic hypotension: Secondary | ICD-10-CM

## 2014-08-09 DIAGNOSIS — I4891 Unspecified atrial fibrillation: Secondary | ICD-10-CM

## 2014-08-09 DIAGNOSIS — E118 Type 2 diabetes mellitus with unspecified complications: Secondary | ICD-10-CM

## 2014-08-09 NOTE — Progress Notes (Signed)
Patient ID: Benjamin Soto, male    DOB: May 12, 1936, 78 y.o.   MRN: [id number]  HPI Comments: Benjamin Soto is a pleasant 78 year old gentleman with a history of chronic atrial fibrillation, long smoking hx, heavy ETOH hx, remote history of SVT, with severe arthritis in his knees,  on warfarin ,  with a history of diabetes, hyperlipidemia, who presents for routine followup, He has nonischemic cardiomyopathy, cardiac catheterization in May 2014 showing nonobstructive CAD, ejection fraction 30-35% Echocardiogram with ejection fraction 20-25% History low testosterone in the past, is not on any supplement Previous recommendations to stop drinking alcohol. He has found this difficult to do.  Lisinopril was held previously secondary to low blood pressures and dizziness. He presents for follow-up of his cardiomyopathy, hypertension  In follow-up today, he reports that he is no longer taking digoxin. He has rare episodes of lightheadedness when he stands up Weight is down 7 pounds from his prior clinic visit. He has been watching what he eats. Wife reports that he is just not hungry He declined ICD when previously evaluated by Dr. Caryl Comes. He denies any near-syncope or syncope. Continues to drink alcohol, unclear as to how much volume He takes Lasix sporadically for leg edema, denies any worsening shortness of breath  EKG on today's visit shows atrial fibrillation with rate 72 bpm, diffuse nonspecific ST abnormality  Other past medical history  In the past, he would drink wine on a regular basis, sometimes in heavy amounts.  History of falls, mechanical fall over the summer 2014  on concrete  Previous episode of confusion, amnesia. Wife witnessed episode 08/09/2013. She felt he was having a stroke. 911 was called and he was transported to [facility].  Carotid ultrasound showed mild bilateral carotid disease. INR at the time was greater than 2. CT scan of the brain, MRI of the brain showed diffuse cortical  atrophy, chronic ischemic white matter disease TIA or stroke. EKG 08/09/2013 showing atrial fibrillation with rate 130 beats per minute. He attributed everything to scalp resection procedure done twice with dermatology prior to the event. He had significant pain around his head prior to the episode. Significant oozing from the wound site. This was fixed on repeat surgery, scraping with additional stitches placed.  Echocardiogram was repeated showing ejection fraction 20-25%, moderate mitral regurgitation, moderate LVH  cholesterol is well controlled.  CT Scan of his abdomen  showed coronary calcifications.   chest x-ray showed calcified granuloma in the right lower lobe which was not seen in 2012 Labs show total cholesterol less than 100, LDL in the 40 range    Outpatient Encounter Prescriptions as of 08/09/2014  Medication Sig  . albuterol (PROVENTIL HFA;VENTOLIN HFA) 108 (90 BASE) MCG/ACT inhaler Inhale 2 puffs into the lungs every 6 (six) hours as needed for wheezing.  Marland Kitchen allopurinol (ZYLOPRIM) 100 MG tablet Take 100 mg by mouth daily.  Marland Kitchen allopurinol (ZYLOPRIM) 300 MG tablet Take 1 tablet (300 mg total) by mouth daily.  . carvedilol (COREG) 12.5 MG tablet Take 1 tablet (12.5 mg total) by mouth 2 (two) times daily with a meal.  . furosemide (LASIX) 40 MG tablet Take 1 tablet (40 mg total) by mouth every other day.  Marland Kitchen glucose blood (ONE TOUCH TEST STRIPS) test strip Use once daily  . losartan (COZAAR) 25 MG tablet Take 1 tablet (25 mg total) by mouth daily.  . metFORMIN (GLUCOPHAGE) 500 MG tablet Take 1 tablet (500 mg total) by mouth daily with breakfast.  . simvastatin (ZOCOR) 20  MG tablet Take 1 tablet (20 mg total) by mouth daily.  Marland Kitchen warfarin (COUMADIN) 5 MG tablet Take 2.5mg  on Mon, Wed, and Fri. Take 5mg  all other days as directed (Patient taking differently: Take 5 mg on Mon, Tues, Wed, & Thursday with 2.5 mg on Friday.)  . [DISCONTINUED] brimonidine (ALPHAGAN P) 0.1 % SOLN Place 1 drop  into both eyes 2 (two) times daily.  . [DISCONTINUED] digoxin (LANOXIN) 0.0625 mg TABS tablet Take 0.5 tablets (0.0625 mg total) by mouth daily.   Social history  reports that he quit smoking about a year ago. His smoking use included Cigarettes and Pipe. He has a 11 pack-year smoking history. He has never used smokeless tobacco. He reports that he drinks about 2.4 oz of alcohol per week. He reports that he does not use illicit drugs.  Review of Systems  Constitutional: Negative.   Eyes: Negative.   Respiratory: Negative.   Cardiovascular: Negative.   Gastrointestinal: Negative.   Musculoskeletal: Negative.   Neurological: Negative.   Hematological: Negative.   All other systems reviewed and are negative.  BP 110/60 mmHg  Pulse 72  Ht 5\' 9"  (1.753 m)  Wt 185 lb 5 oz (84.057 kg)  BMI 27.35 kg/m2  Physical Exam  Constitutional: He is oriented to person, place, and time. He appears well-developed and well-nourished.  HENT:  Head: Normocephalic.  Nose: Nose normal.  Mouth/Throat: Oropharynx is clear and moist.  Eyes: Conjunctivae are normal. Pupils are equal, round, and reactive to light.  Neck: Normal range of motion. Neck supple. No JVD present.  Cardiovascular: Normal rate, regular rhythm, S1 normal, S2 normal, normal heart sounds and intact distal pulses.  Exam reveals no gallop and no friction rub.   No murmur heard. Pulmonary/Chest: Effort normal and breath sounds normal. No respiratory distress. He has no wheezes. He has no rales. He exhibits no tenderness.  Abdominal: Soft. Bowel sounds are normal. He exhibits no distension. There is no tenderness.  Musculoskeletal: Normal range of motion. He exhibits no edema or tenderness.  Lymphadenopathy:    He has no cervical adenopathy.  Neurological: He is alert and oriented to person, place, and time. Coordination normal.  Skin: Skin is warm and dry. No rash noted. No erythema.  Psychiatric: He has a normal mood and affect. His  behavior is normal. Judgment and thought content normal.      Assessment and Plan   Nursing note and vitals reviewed.

## 2014-08-09 NOTE — Assessment & Plan Note (Signed)
Severely depressed ejection fraction, likely secondary to alcohol. Nonischemic. He declined ICD after evaluation by EP

## 2014-08-09 NOTE — Assessment & Plan Note (Signed)
Recommended that he continue his slow weight loss. Recommended a regular walking program, watching his diet

## 2014-08-09 NOTE — Assessment & Plan Note (Signed)
He appears euvolemic on today's visit. Recommended that he stay on his current medications. Occasional orthostasis. If he has continued weight loss, dizziness, may need to decrease the dose of losartan

## 2014-08-09 NOTE — Assessment & Plan Note (Signed)
Again recommended he limit his alcohol intake

## 2014-08-09 NOTE — Assessment & Plan Note (Signed)
Rare symptoms. Suggested he call the office for worsening symptoms. Would hold the losartan

## 2014-08-09 NOTE — Patient Instructions (Signed)
You are doing well. No medication changes were made.  Ask the pharamacy about the cost of Xarelto 20 mg daily  Please call us if you have new issues that need to be addressed before your next appt.  Your physician wants you to follow-up in: 6 months.  You will receive a reminder letter in the mail two months in advance. If you don't receive a letter, please call our office to schedule the follow-up appointment.

## 2014-08-09 NOTE — Assessment & Plan Note (Signed)
Heart rate well controlled. We'll continue  beta blocker, anticoagulation

## 2014-08-09 NOTE — Assessment & Plan Note (Signed)
Recommended that he continue on his simvastatin daily

## 2014-09-17 ENCOUNTER — Ambulatory Visit (INDEPENDENT_AMBULATORY_CARE_PROVIDER_SITE_OTHER): Payer: Medicare Other | Admitting: Family Medicine

## 2014-09-17 DIAGNOSIS — Z5181 Encounter for therapeutic drug level monitoring: Secondary | ICD-10-CM

## 2014-09-17 DIAGNOSIS — I4891 Unspecified atrial fibrillation: Secondary | ICD-10-CM

## 2014-09-17 DIAGNOSIS — Z7901 Long term (current) use of anticoagulants: Secondary | ICD-10-CM

## 2014-09-17 LAB — POCT INR: INR: 1.8

## 2014-09-24 ENCOUNTER — Other Ambulatory Visit: Payer: Self-pay

## 2014-09-25 ENCOUNTER — Other Ambulatory Visit: Payer: Self-pay | Admitting: Family Medicine

## 2014-09-25 ENCOUNTER — Other Ambulatory Visit (INDEPENDENT_AMBULATORY_CARE_PROVIDER_SITE_OTHER): Payer: Medicare Other

## 2014-09-25 DIAGNOSIS — E118 Type 2 diabetes mellitus with unspecified complications: Secondary | ICD-10-CM

## 2014-09-25 DIAGNOSIS — M109 Gout, unspecified: Secondary | ICD-10-CM

## 2014-09-25 DIAGNOSIS — D696 Thrombocytopenia, unspecified: Secondary | ICD-10-CM

## 2014-09-25 DIAGNOSIS — I10 Essential (primary) hypertension: Secondary | ICD-10-CM

## 2014-09-25 LAB — COMPREHENSIVE METABOLIC PANEL
ALT: 26 U/L (ref 0–53)
AST: 29 U/L (ref 0–37)
Albumin: 3.8 g/dL (ref 3.5–5.2)
Alkaline Phosphatase: 62 U/L (ref 39–117)
BUN: 34 mg/dL — ABNORMAL HIGH (ref 6–23)
CO2: 26 mEq/L (ref 19–32)
Calcium: 9.2 mg/dL (ref 8.4–10.5)
Chloride: 107 mEq/L (ref 96–112)
Creatinine, Ser: 1.4 mg/dL (ref 0.4–1.5)
GFR: 51.88 mL/min — ABNORMAL LOW (ref >60.00)
Glucose, Bld: 130 mg/dL — ABNORMAL HIGH (ref 70–99)
Potassium: 4.6 mEq/L (ref 3.5–5.1)
Sodium: 139 mEq/L (ref 135–145)
Total Bilirubin: 1 mg/dL (ref 0.2–1.2)
Total Protein: 5.9 g/dL — ABNORMAL LOW (ref 6.0–8.3)

## 2014-09-25 LAB — LIPID PANEL
Cholesterol: 135 mg/dL (ref 0–200)
HDL: 49.7 mg/dL (ref >39.00)
LDL Cholesterol: 67 mg/dL (ref 0–99)
NonHDL: 85.3
Total CHOL/HDL Ratio: 3
Triglycerides: 94 mg/dL (ref 0.0–149.0)
VLDL: 18.8 mg/dL (ref 0.0–40.0)

## 2014-09-25 LAB — CBC WITH DIFFERENTIAL/PLATELET
Basophils Absolute: 0 10*3/uL (ref 0.0–0.1)
Basophils Relative: 0.3 % (ref 0.0–3.0)
Eosinophils Absolute: 0.2 10*3/uL (ref 0.0–0.7)
Eosinophils Relative: 3.3 % (ref 0.0–5.0)
HCT: 42.3 % (ref 39.0–52.0)
Hemoglobin: 13.5 g/dL (ref 13.0–17.0)
Lymphocytes Relative: 26.4 % (ref 12.0–46.0)
Lymphs Abs: 1.2 10*3/uL (ref 0.7–4.0)
MCHC: 31.9 g/dL (ref 30.0–36.0)
MCV: 99.3 fl (ref 78.0–100.0)
Monocytes Absolute: 0.4 10*3/uL (ref 0.1–1.0)
Monocytes Relative: 9.4 % (ref 3.0–12.0)
Neutro Abs: 2.8 10*3/uL (ref 1.4–7.7)
Neutrophils Relative %: 60.6 % (ref 43.0–77.0)
Platelets: 162 10*3/uL (ref 150.0–400.0)
RBC: 4.26 Mil/uL (ref 4.22–5.81)
RDW: 16.3 % — ABNORMAL HIGH (ref 11.5–15.5)
WBC: 4.7 10*3/uL (ref 4.0–10.5)

## 2014-09-25 LAB — HEMOGLOBIN A1C: Hgb A1c MFr Bld: 6.1 % (ref 4.6–6.5)

## 2014-09-25 LAB — TSH: TSH: 3.36 u[IU]/mL (ref 0.35–4.50)

## 2014-09-25 LAB — URIC ACID: Uric Acid, Serum: 4.4 mg/dL (ref 4.0–7.8)

## 2014-09-27 ENCOUNTER — Ambulatory Visit (INDEPENDENT_AMBULATORY_CARE_PROVIDER_SITE_OTHER): Payer: Medicare Other | Admitting: Family Medicine

## 2014-09-27 ENCOUNTER — Telehealth: Payer: Self-pay | Admitting: Family Medicine

## 2014-09-27 ENCOUNTER — Encounter: Payer: Self-pay | Admitting: Family Medicine

## 2014-09-27 VITALS — BP 104/60 | HR 69 | Temp 97.2°F | Ht 69.0 in | Wt 183.8 lb

## 2014-09-27 DIAGNOSIS — E785 Hyperlipidemia, unspecified: Secondary | ICD-10-CM

## 2014-09-27 DIAGNOSIS — Z0000 Encounter for general adult medical examination without abnormal findings: Secondary | ICD-10-CM | POA: Diagnosis not present

## 2014-09-27 DIAGNOSIS — Z7189 Other specified counseling: Secondary | ICD-10-CM

## 2014-09-27 DIAGNOSIS — M109 Gout, unspecified: Secondary | ICD-10-CM | POA: Diagnosis not present

## 2014-09-27 DIAGNOSIS — Z23 Encounter for immunization: Secondary | ICD-10-CM | POA: Diagnosis not present

## 2014-09-27 DIAGNOSIS — I10 Essential (primary) hypertension: Secondary | ICD-10-CM

## 2014-09-27 DIAGNOSIS — E118 Type 2 diabetes mellitus with unspecified complications: Secondary | ICD-10-CM

## 2014-09-27 MED ORDER — WARFARIN SODIUM 5 MG PO TABS: ORAL_TABLET | ORAL | Status: DC

## 2014-09-27 MED ORDER — FUROSEMIDE 40 MG PO TABS: 40.0000 mg | ORAL_TABLET | ORAL | Status: DC

## 2014-09-27 MED ORDER — ALLOPURINOL 300 MG PO TABS: 300.0000 mg | ORAL_TABLET | Freq: Every day | ORAL | Status: DC

## 2014-09-27 MED ORDER — SIMVASTATIN 20 MG PO TABS: 20.0000 mg | ORAL_TABLET | Freq: Every day | ORAL | Status: DC

## 2014-09-27 MED ORDER — ALLOPURINOL 100 MG PO TABS: 100.0000 mg | ORAL_TABLET | Freq: Every day | ORAL | Status: DC

## 2014-09-27 MED ORDER — IPRATROPIUM BROMIDE 0.06 % NA SOLN: 2.0000 | Freq: Every day | NASAL | Status: DC

## 2014-09-27 MED ORDER — CARVEDILOL 12.5 MG PO TABS: 12.5000 mg | ORAL_TABLET | Freq: Two times a day (BID) | ORAL | Status: DC

## 2014-09-27 MED ORDER — METFORMIN HCL 500 MG PO TABS: 500.0000 mg | ORAL_TABLET | Freq: Every day | ORAL | Status: DC

## 2014-09-27 NOTE — Patient Instructions (Signed)
Don't change your meds for now, other than potentially cutting back to 1 metformin a day if your sugar is low.  Recheck in about 6 months, with labs before the visit.  Take care.  Glad to see you.  I'll check on the tetanus coverage.  We may need to do that later on at your next visit.

## 2014-09-27 NOTE — Telephone Encounter (Signed)
emmi emailed °

## 2014-09-27 NOTE — Progress Notes (Signed)
Pre visit review using our clinic review tool, if applicable. No additional management support is needed unless otherwise documented below in the visit note.  CPE- See plan.  Routine anticipatory guidance given to patient.  See health maintenance. Tetanus 2004 PNA 2010 Flu prev done Shingles 2012 PSA declined, d/w pt.   Colonoscopy 2009 Living will d/w pt.  Wife designated if patient were incapacitated.  Diet and exercise d/w pt, encouraged more walking.  Eye exam recently done. Has hearing aids, doesn't need recheck hearing now.  Alert to date/month/year, 3/3 recall, can read a clock and do basic math.   Diabetes:  Using medications without difficulties:yes Hypoglycemic episodes:no Hyperglycemic episodes:prev with some elevation, improved on BID metformin.  Feet problems:no Blood Sugars averaging: ~100 now eye exam within last year:yes  Gout.  No ade on meds, no flares, compliant.   Hypertension:    Using medication without problems or lightheadedness: rarely lightheaded, o/w no problems Chest pain with exertion:no Edema:no Short of breath:no  Elevated Cholesterol: Using medications without problems:yes Muscle aches: no Diet compliance:encouraged Exercise: encouraged  PMH and SH reviewed  Meds, vitals, and allergies reviewed.   ROS: See HPI.  Otherwise negative.    GEN: nad, alert and oriented HEENT: mucous membranes moist NECK: supple w/o LA CV: rrr. PULM: ctab, no inc wob ABD: soft, +bs EXT: no edema SKIN: no acute rash  Diabetic foot exam: Normal inspection No skin breakdown No calluses  Normal DP pulses Normal sensation to light touch and monofilament Nails normal

## 2014-09-28 ENCOUNTER — Encounter: Payer: Self-pay | Admitting: Family Medicine

## 2014-09-28 DIAGNOSIS — Z7189 Other specified counseling: Secondary | ICD-10-CM | POA: Insufficient documentation

## 2014-09-28 NOTE — Assessment & Plan Note (Signed)
No flares, uric acid wnl, continue as is.

## 2014-09-28 NOTE — Assessment & Plan Note (Signed)
Routine anticipatory guidance given to patient.  See health maintenance. Tetanus 2004 PNA 2010 Flu prev done Shingles 2012 PSA declined, d/w pt.   Colonoscopy 2009 Living will d/w pt.  Wife designated if patient were incapacitated.  Diet and exercise d/w pt, encouraged more walking.  Eye exam recently done. Has hearing aids, doesn't need recheck hearing now.  Alert to date/month/year, 3/3 recall, can read a clock and do basic math.

## 2014-09-28 NOTE — Assessment & Plan Note (Signed)
Controlled, no change in meds. Labs d/w pt.  

## 2014-09-28 NOTE — Addendum Note (Signed)
Addended by: Lindalou Hose Y on: 09/28/2014 09:00 AM   Modules accepted: Orders

## 2014-09-28 NOTE — Assessment & Plan Note (Signed)
Controlled, continue as is. D/w pt.  He agrees.

## 2014-09-28 NOTE — Assessment & Plan Note (Signed)
A1c controlled, he can try to back down to 1 metformin a day as long as his sugar allows.  Labs and diet/exercise d/w pt.

## 2014-10-10 ENCOUNTER — Telehealth: Payer: Self-pay | Admitting: Family Medicine

## 2014-10-10 NOTE — Telephone Encounter (Signed)
Patient notified as instructed by telephone and verbalized understanding. 

## 2014-10-10 NOTE — Telephone Encounter (Signed)
Left message on answering machine to call back.

## 2014-10-10 NOTE — Telephone Encounter (Signed)
Please call pt.  I checked on the tetanus coverage.  If a patient needed a booster shot of his or her tetanus vaccine, unrelated to injury or illness, it would be covered under Part D of medicare.  That would likely be cheaper at the pharmacy.  If he needs a rx for that, then let me know.   Thanks.

## 2014-10-21 ENCOUNTER — Other Ambulatory Visit: Payer: Self-pay | Admitting: Family Medicine

## 2014-10-29 ENCOUNTER — Other Ambulatory Visit (INDEPENDENT_AMBULATORY_CARE_PROVIDER_SITE_OTHER): Payer: Medicare Other

## 2014-10-29 DIAGNOSIS — Z7901 Long term (current) use of anticoagulants: Secondary | ICD-10-CM | POA: Diagnosis not present

## 2014-10-29 DIAGNOSIS — I4891 Unspecified atrial fibrillation: Secondary | ICD-10-CM

## 2014-10-29 DIAGNOSIS — Z5181 Encounter for therapeutic drug level monitoring: Secondary | ICD-10-CM | POA: Diagnosis not present

## 2014-10-29 LAB — POCT INR: INR: 2.7

## 2014-11-24 IMAGING — CR DG ELBOW COMPLETE 3+V*R*
3 series · 3 of 3 positions shown · non-contrast
Comparison: None.

CLINICAL DATA: Pain post trauma

RIGHT ELBOW - COMPLETE 3+ VIEW

[view not recorded (1 of 3)]
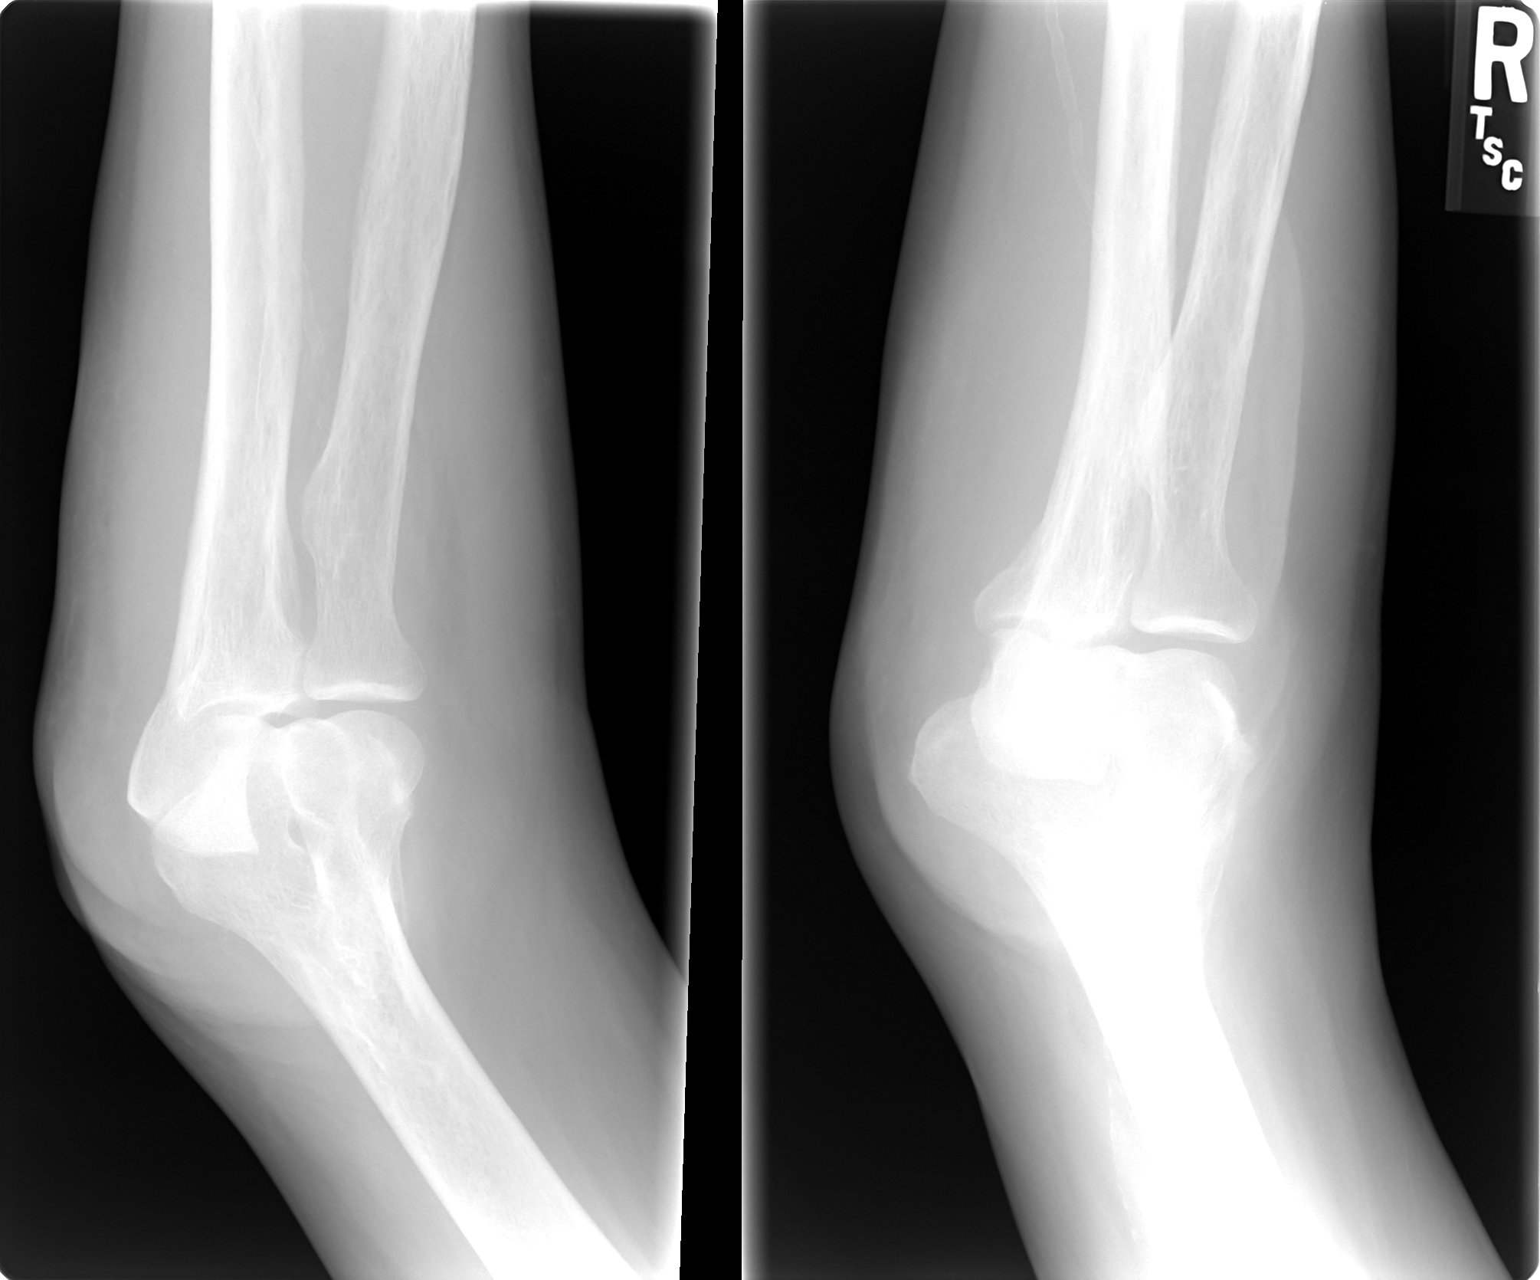

[view not recorded (2 of 3)]
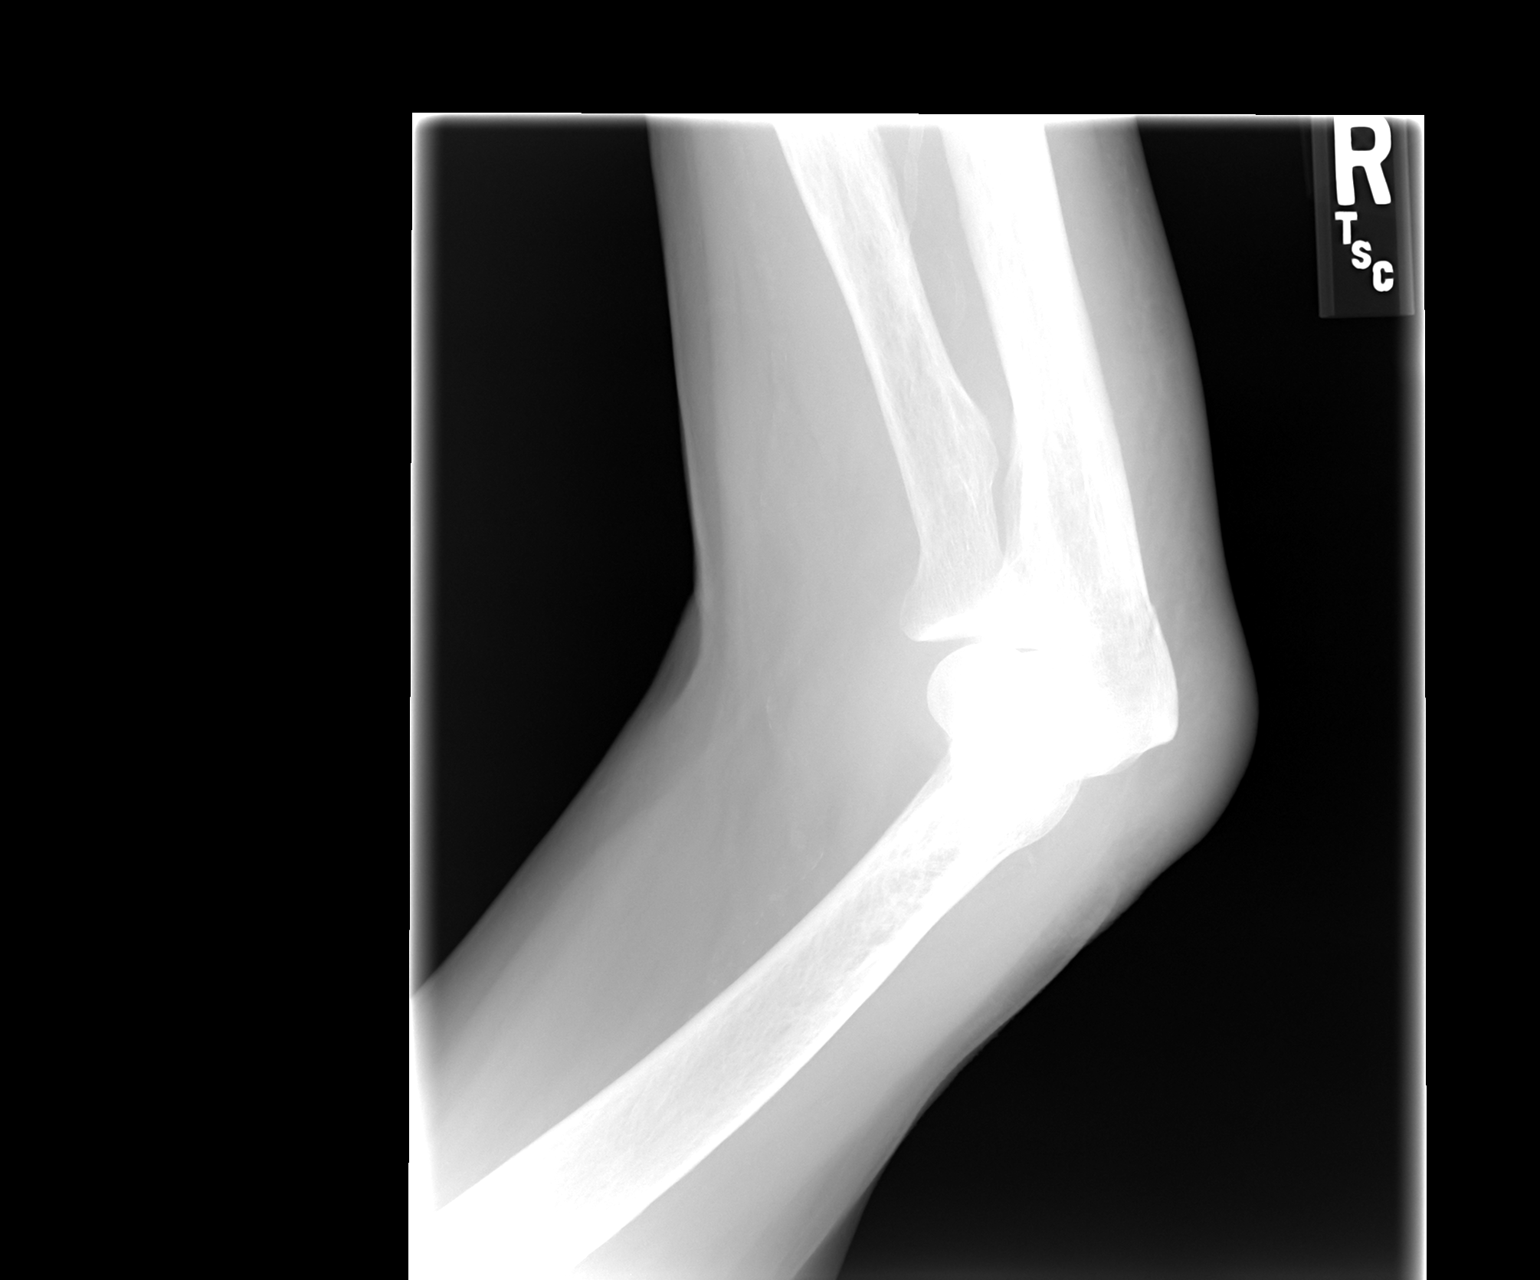

[view not recorded (3 of 3)]
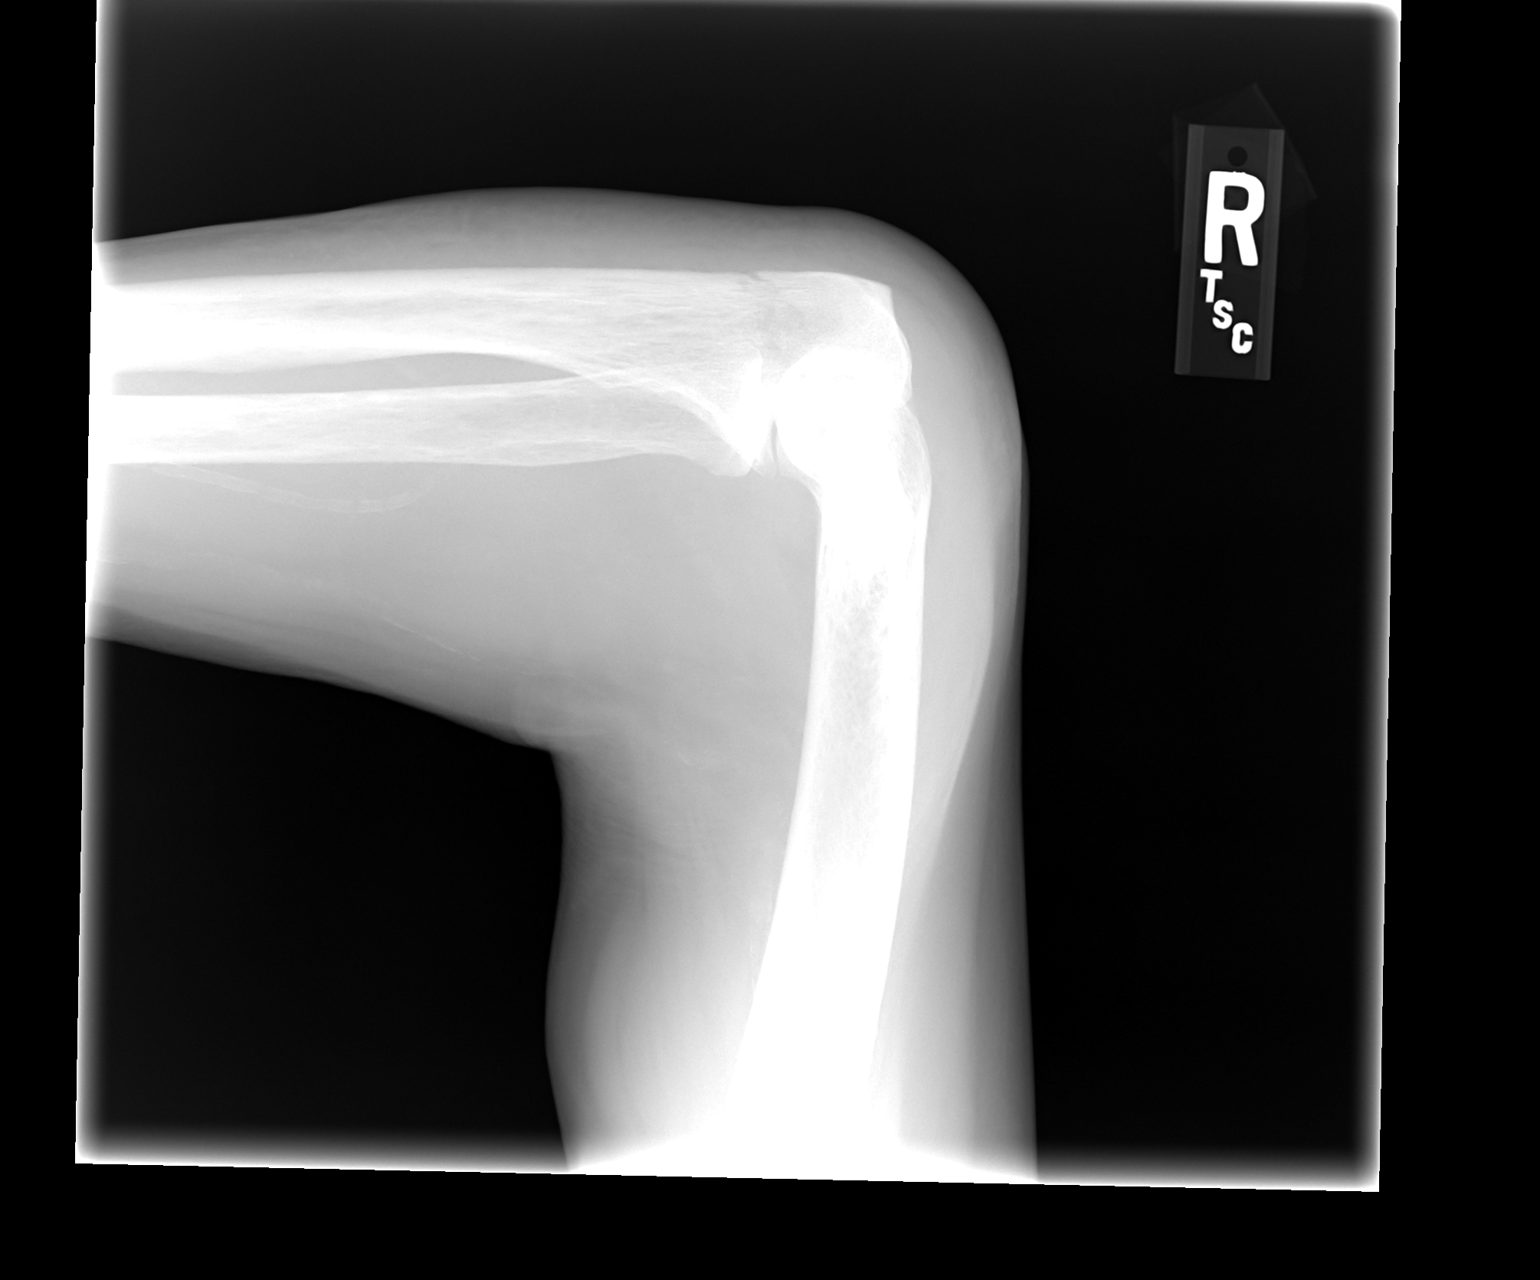

[3 of 3 positions shown; findings below may reference images not displayed]

FINDINGS: Frontal, lateral, and bilateral oblique views were
obtained.  There is a comminuted fracture of the proximal ulna in
near anatomic alignment.  There is no other fracture.  No
dislocation.  No appreciable joint effusion.  Bones are
osteoporotic.  There is no appreciable joint space narrowing.
IMPRESSION: Comminuted fracture proximal ulna.

## 2014-12-10 ENCOUNTER — Other Ambulatory Visit (INDEPENDENT_AMBULATORY_CARE_PROVIDER_SITE_OTHER): Payer: Medicare Other

## 2014-12-10 DIAGNOSIS — Z5181 Encounter for therapeutic drug level monitoring: Secondary | ICD-10-CM | POA: Diagnosis not present

## 2014-12-10 DIAGNOSIS — Z7901 Long term (current) use of anticoagulants: Secondary | ICD-10-CM | POA: Diagnosis not present

## 2014-12-10 DIAGNOSIS — I4891 Unspecified atrial fibrillation: Secondary | ICD-10-CM | POA: Diagnosis not present

## 2014-12-10 LAB — POCT INR: INR: 2.8

## 2014-12-11 DIAGNOSIS — H02833 Dermatochalasis of right eye, unspecified eyelid: Secondary | ICD-10-CM | POA: Diagnosis not present

## 2014-12-11 DIAGNOSIS — H04123 Dry eye syndrome of bilateral lacrimal glands: Secondary | ICD-10-CM | POA: Diagnosis not present

## 2014-12-11 DIAGNOSIS — H11129 Conjunctival concretions, unspecified eye: Secondary | ICD-10-CM | POA: Diagnosis not present

## 2014-12-25 ENCOUNTER — Encounter: Payer: Self-pay | Admitting: Family Medicine

## 2014-12-25 ENCOUNTER — Ambulatory Visit (INDEPENDENT_AMBULATORY_CARE_PROVIDER_SITE_OTHER): Payer: Medicare Other | Admitting: Family Medicine

## 2014-12-25 VITALS — BP 112/64 | HR 63 | Temp 97.4°F

## 2014-12-25 DIAGNOSIS — R059 Cough, unspecified: Secondary | ICD-10-CM

## 2014-12-25 DIAGNOSIS — R05 Cough: Secondary | ICD-10-CM

## 2014-12-25 DIAGNOSIS — I10 Essential (primary) hypertension: Secondary | ICD-10-CM | POA: Diagnosis not present

## 2014-12-25 MED ORDER — DOXYCYCLINE HYCLATE 100 MG PO TABS: 100.0000 mg | ORAL_TABLET | Freq: Two times a day (BID) | ORAL | Status: DC

## 2014-12-25 MED ORDER — FUROSEMIDE 40 MG PO TABS: ORAL_TABLET | ORAL | Status: DC

## 2014-12-25 MED ORDER — BENZONATATE 200 MG PO CAPS: 200.0000 mg | ORAL_CAPSULE | Freq: Three times a day (TID) | ORAL | Status: DC | PRN

## 2014-12-25 NOTE — Progress Notes (Signed)
Pre visit review using our clinic review tool, if applicable. No additional management support is needed unless otherwise documented below in the visit note.  Sick for about 4-5 days.  First noted ST, "in the back of my throat".  No fevers, chills, vomiting.  Some cough, some last night.  Some sputum.  No ear pain.  His cough is "deeper, likely something is trying to break loose."   Not SOB.  Hasn't had to use inhaler recently.  Sleeping well at night.    He had been slightly lightheaded on standing, over the last few weeks.  No BLE edema.  Taking lasix MWF.    Meds, vitals, and allergies reviewed.   ROS: See HPI.  Otherwise, noncontributory.  GEN: nad, alert and oriented HEENT: mucous membranes moist, tm w/o erythema, nasal exam w/o erythema, clear discharge noted,  OP with cobblestoning NECK: supple w/o LA CV: IRR  PULM: B scattered rhonchi but no focal dec in BS, no wheeze, no inc wob EXT: no edema SKIN: no acute rash

## 2014-12-25 NOTE — Patient Instructions (Signed)
Take lasix on Mondays and Thursdays. If you still have trouble with lightheadedness, then notify me.   Take doxycyline twice a day for 10 days.  Take 2.5mg  of coumadin a day while on doxycycline then resume your previous dosing (5mg  a day except for 7.5mg  a day on Mondays and Thursdays). Use the inhaler if needed for wheezing.   Take tessalon as needed for cough.  Update me as needed.  Take care.  Glad to see you.

## 2014-12-26 DIAGNOSIS — R059 Cough, unspecified: Secondary | ICD-10-CM | POA: Insufficient documentation

## 2014-12-26 DIAGNOSIS — R05 Cough: Secondary | ICD-10-CM | POA: Insufficient documentation

## 2014-12-26 NOTE — Assessment & Plan Note (Signed)
Would treat given his other comorbid conditions and the fact that that he isn't improving yet on his own.  Start doxy, cut coumadin to 2.5mg  day while on abx.  He agrees.  Call back as needed.  Nontoxic.  Still okay for outpatient f/u.

## 2014-12-26 NOTE — Assessment & Plan Note (Signed)
He may be overtreated with lasix, would cut back to twice a day week use for now.  Not fluid overloaded on exam.  He agrees.

## 2015-01-03 DIAGNOSIS — H579 Unspecified disorder of eye and adnexa: Secondary | ICD-10-CM | POA: Diagnosis not present

## 2015-01-03 DIAGNOSIS — H401290 Low-tension glaucoma, unspecified eye, stage unspecified: Secondary | ICD-10-CM | POA: Diagnosis not present

## 2015-01-11 NOTE — H&P (Signed)
PATIENT NAME:  Benjamin Soto, Benjamin Soto MR#:  [id number] DATE OF BIRTH:  03-Sep-1936  DATE OF ADMISSION:  08/09/2013  PRIMARY CARE PHYSICIAN: Modesto Charon, MD  CHIEF COMPLAINT: Slurred speech and altered mental status.   HISTORY OF PRESENT ILLNESS: This is a 79 year old male who presents to the hospital due to altered mental status. The patient himself is not the best historian; therefore, most of the history obtained from the wife at bedside. As per the wife, the patient has been more altered and confused. She tried to ask him a question and patient did not respond appropriately. The patient also sounded a bit more confused. She attempted to call his primary care physician, who could not see him urgently and asked her to bring him to the ER. As per the wife, the patient apparently had wrote a letter for somebody who has recently been deceased and it was a beautiful letter and made concrete sense.  The patient remembers writing the letter but this morning the patient was asking questions about the letter, which was not appropriate as per the wife. Patient was also not oriented to place. The patient's wife, therefore, was concerned and brought him to the ER. The patient presently is alert, is awake, does not sound aphasic but does sound confused as he is not alert, awake, oriented to place and time. The patient does have a history of chronic A. fib and there is some concern for possible underlying stroke although his INR is therapeutic. Hospitalist services were contacted for further treatment and evaluation.   REVIEW OF SYSTEMS: CONSTITUTIONAL: No documented fever. No weight gain or weight loss.  EYES: No blurred or double vision.  ENT: No tinnitus, no postnasal drip. No redness of the oropharynx.  RESPIRATORY: No cough. No wheeze. No hemoptysis. Positive dyspnea. Positive COPD.   CARDIOVASCULAR: No chest pain, no orthopnea, no palpitations, no syncope.  GASTROINTESTINAL: No nausea, no vomiting, no  diarrhea. No abdominal pain. No melena or hematochezia.  GENITOURINARY: No dysuria or hematuria.  ENDOCRINE: No polyuria or nocturia, heat or cold intolerance.  HEMATOLOGIC: No anemia, no bruising, no bleeding.  INTEGUMENTARY: No rashes. No lesions.  MUSCULOSKELETAL: No arthritis. No swelling.  No gout.  NEUROLOGIC: No numbness or tingling. No ataxia. No seizure-type activity. Positive altered mental status and possible aphagia.  PSYCHIATRIC: No anxiety, no insomnia. No ADD.   PAST MEDICAL HISTORY: Consistent with hypertension, diabetes, chronic atrial fibrillation, COPD with ongoing tobacco abuse, hyperlipidemia, gout.   ALLERGIES: LISINOPRIL.   SOCIAL HISTORY: Still smokes a few cigarettes per day, has been smoking now for the past 40 to 50 years. Occasional alcohol use. No illicit drug abuse. Lives at home with his wife.   FAMILY HISTORY: Both mother and father are deceased. Both died from complications of diabetes.   CURRENT MEDICATIONS: Allopurinol 300 mg daily, Coreg 12.5 mg b.i.d., metformin 500 mg b.i.d., albuterol inhaler 2 puffs q.6 hours as needed, simvastatin 20 mg at bedtime, warfarin 5 mg 1/2 tab on Friday, warfarin 5 mg at bedtime daily except on Friday.   PHYSICAL EXAMINATION: Presently is as follows:  VITAL SIGNS:  Temperature is 98.3, pulse 118, respirations 18, blood pressure 139/79, sats 96% on room air.  GENERAL: He is a pleasant-appearing male in no apparent distress.  HEAD, EYES, EARS, NOSE AND THROAT EXAM: He is atraumatic, normocephalic. His extraocular muscles are intact. His pupils are equal and reactive to light. Sclerae anicteric. No conjunctival injection. No pharyngeal erythema.  NECK: Supple. There is no jugular  venous distention. No bruits. No lymphadenopathy or thyromegaly.  HEART: Irregular, tachycardic. No murmurs, no rubs, no clicks.  LUNGS: Clear to auscultation bilaterally. No rales, no rhonchi, no wheezes. He has prolonged inspiratory and expiratory  phase.  ABDOMEN: Soft, flat, nontender, nondistended. Has good bowel sounds. No hepatosplenomegaly appreciated.  EXTREMITIES: No evidence of any cyanosis, clubbing or peripheral edema. Has +2 pedal and radial pulses bilaterally.  NEUROLOGIC: The patient is alert, awake and oriented x 1. No other focal motor or sensory deficits appreciated bilaterally.  SKIN: Moist and warm with no rashes.  LYMPHATIC: There is no cervical or axillary lymphadenopathy.   LABORATORY AND RADIOLOGICAL DATA: Serum glucose of 95, BUN 17, creatinine 1.1, sodium 138, potassium 4.9, chloride 107, bicarb 25. LFTs are within normal limits. Troponin 0.03. White cell count 11.6, hemoglobin 14, hematocrit 43.1, platelet count of 138. INR is 2.4. The patient did have a chest x-ray done which showed no acute cardiopulmonary disease. The patient also had a CT of the head done without contrast which showed diffuse cortical atrophy and chronic ischemic white matter disease.   ASSESSMENT AND PLAN: This is a 78 year old male with past medical history of hypertension, diabetes, history of chronic atrial fibrillation, chronic obstructive pulmonary disease with ongoing tobacco abuse, hyperlipidemia, gout, who presents to the hospital due to altered mental status, slurred speech.  1.  Altered mental status/slurred speech. The exact etiology of this is unclear. It is suspicious for a possible stroke versus metabolic encephalopathy although the patient's CT head is negative. He has no acute metabolic source presently. The urinalysis is still pending. For now, will observe him overnight, follow q.4 hour neuro checks, work him up for a possible stroke by doing an echocardiogram, a carotid duplex and an MRI of the brain. The chances of him having a stroke are probably low given the fact that his INR is therapeutic at 2.4. His mental status change is most likely consistent with early cognitive decline/dementia. He likely needs further neuropsychiatric  testing done as an outpatient.  2.  History of chronic atrial fibrillation. The patient's rates are a bit uncontrolled. I will continue his Coreg for now. I will also place him on some p.r.n. IV Cardizem and follow his heart rate. Continue Coumadin, INR is therapeutic. The patient is followed by Dr. Rockey Situ.  3.  Diabetes. I will hold his metformin, place him on sliding scale insulin.  4.  Chronic obstructive pulmonary disease with ongoing tobacco abuse. No evidence of acute chronic obstructive pulmonary disease exacerbation. Continue p.r.n. albuterol.  5.  Hyperlipidemia. Continue simvastatin.  6.  Gout. No acute attack. Continue with his allopurinol.  7.  CODE STATUS: The patient is a full code.   TIME SPENT ON ADMISSION: 45 minutes.      ____________________________ Belia Heman. Verdell Carmine, MD vjs:cs D: 08/09/2013 18:46:29 ET T: 08/09/2013 19:03:16 ET JOB#: [id number]  cc: Belia Heman. Verdell Carmine, MD, <Dictator> Henreitta Leber MD ELECTRONICALLY SIGNED 08/26/2013 18:32

## 2015-01-11 NOTE — Discharge Summary (Signed)
PATIENT NAME:  Benjamin Soto, Benjamin Soto MR#:  [id number] DATE OF BIRTH:  Jan 15, 1936  DATE OF ADMISSION:  08/09/2013  DATE OF DISCHARGE:  08/10/2013  DISCHARGE DIAGNOSES: 1.  Altered mental status, likely transient global amnesia, now resolved and back to baseline. No CVA.  2.  Severe cardiomyopathy, with EF of 20% to 25%, chronic in nature, as per Cardiology.   SECONDARY DIAGNOSES: 1.  Hypertension.  2.  Diabetes.  3.  Chronic atrial fibrillation.  4.  COPD.   5.  Hyperlipidemia.  6.  Gout.   CONSULTATIONS: None.   PROCEDURES/RADIOLOGY: A 2-D echocardiogram on 20th of November showed severely decreased global LV systolic function. Global hypokinesis. LVEF of 20% to 25%. Moderately concentric LVH. Mild to moderately increased LV internal cavity size. Moderately dilated left and right atrium. Moderate mitral valve regurgitation. Mild aortic valve sclerosis without stenosis.   Bilateral carotid Dopplers on 20th of November showed less than 50% stenosis bilaterally of the ICA. No hemodynamically significant stenosis. Mild amount of calcified plaque present at both carotid bifurcations.   MRI of the brain without contrast on 20th of November showed no acute intracranial abnormality. Chronic left cerebellar infarct. Chronic small vessel ischemic disease. Fairly large and broad-based scalp hematoma, increased since 08/09/2013.   Chest x-ray on November 19 showed mild enlargement of cardiac silhouette. No acute abnormalities.   CT scan of the head without contrast on November 19 showed diffuse cortical atrophy. Chronic small vessel ischemic white matter disease. No acute intracranial abnormality.   MAJOR LABORATORY PANEL: Urinalysis on admission was negative.   HISTORY AND SHORT HOSPITAL COURSE: The patient is a 79 year old white male with the above-mentioned medical problems, who was admitted for altered mental status/slurred speech. As per his wife, patient had some amnesia. He was writing a letter  and forgot what he wrote after a while. This was resolved. He underwent complete neurological workup in the hospital, which was essentially all negative. Please see Dr. Edward Jolly dictated history and physical for further details. The patient was back to his baseline, and is being discharged home in stable condition.   VITAL SIGNS: On the date of discharge, his vital signs are as follows: Temperature 97.9, heart rate 76 per minute, respirations 18 per minute, blood pressure 121/83 mmHg, he was saturating 97% on room air.   PERTINENT PHYSICAL EXAMINATION ON THE DATE OF DISCHARGE:  CARDIOVASCULAR: S1, S2 normal. No murmur, rubs or gallop.  LUNGS: Clear to auscultation bilaterally. No wheezing, rales or crepitation.  ABDOMEN: Soft, benign.  NEUROLOGIC: Nonfocal examination. All other Physical Exam remains unchanged.   The patient is not a candidate for ACE inhibitor due to his allergy, so we will not give him that for his EF less than 20% to 25%.   Physical examination remained at baseline.   DISCHARGE MEDICATIONS: 1.  Allopurinol 300 mg p.o. daily.  2.  Coreg 12.5 mg p.o. b.i.d.  3.  Metformin 500 mg p.o. b.i.d.  4.  Simvastatin 20 mg p.o. at bedtime.  5.  ProAir 2 puffs inhaled every 6 hours as needed.  7.  Warfarin 2.5 mg p.o. at bedtime on Friday, and 5 mg the rest of the week except Friday.  8.  Aspirin 81 mg p.o. daily.  9.  Guaifenesin 500 mL p.o. every 4 hours as needed.   DISCHARGE DIET: Low sodium.   DISCHARGE ACTIVITY: As tolerated.   DISCHARGE INSTRUCTIONS AND FOLLOWUP: The patient was instructed to follow up with his primary care physician, Dr. Teresa Pelton, in  1 to 2 weeks. He will need followup with Dr. Rockey Situ from Cardiology on December 4 as scheduled. He will need followup with [facility] in 4 to 6 weeks.   Total time discharging this patient was 55 minutes.     ____________________________ Lucina Mellow. Manuella Ghazi, MD vss:mr D: 08/10/2013 16:24:34  ET T: 08/10/2013 20:07:38 ET JOB#: [id number]  cc: Shemeika Starzyk S. Manuella Ghazi, MD, <Dictator> Modesto Charon, MD Minna Merritts, MD [facility]    Lucina Mellow [facility] MD ELECTRONICALLY SIGNED 08/14/2013 5:48

## 2015-01-18 ENCOUNTER — Other Ambulatory Visit (INDEPENDENT_AMBULATORY_CARE_PROVIDER_SITE_OTHER): Payer: Medicare Other

## 2015-01-18 DIAGNOSIS — H401290 Low-tension glaucoma, unspecified eye, stage unspecified: Secondary | ICD-10-CM | POA: Diagnosis not present

## 2015-01-18 DIAGNOSIS — H579 Unspecified disorder of eye and adnexa: Secondary | ICD-10-CM | POA: Diagnosis not present

## 2015-01-18 DIAGNOSIS — I4891 Unspecified atrial fibrillation: Secondary | ICD-10-CM | POA: Diagnosis not present

## 2015-01-18 DIAGNOSIS — Z5181 Encounter for therapeutic drug level monitoring: Secondary | ICD-10-CM | POA: Diagnosis not present

## 2015-01-18 DIAGNOSIS — Z7901 Long term (current) use of anticoagulants: Secondary | ICD-10-CM | POA: Diagnosis not present

## 2015-01-18 LAB — POCT INR: INR: 2.2

## 2015-01-21 ENCOUNTER — Other Ambulatory Visit: Payer: Medicare Other

## 2015-01-21 DIAGNOSIS — H01001 Unspecified blepharitis right upper eyelid: Secondary | ICD-10-CM | POA: Diagnosis not present

## 2015-01-21 DIAGNOSIS — H11129 Conjunctival concretions, unspecified eye: Secondary | ICD-10-CM | POA: Diagnosis not present

## 2015-01-21 DIAGNOSIS — L718 Other rosacea: Secondary | ICD-10-CM | POA: Diagnosis not present

## 2015-01-21 DIAGNOSIS — H02833 Dermatochalasis of right eye, unspecified eyelid: Secondary | ICD-10-CM | POA: Diagnosis not present

## 2015-01-22 ENCOUNTER — Encounter: Payer: Self-pay | Admitting: Cardiovascular Disease

## 2015-01-22 ENCOUNTER — Encounter: Payer: Self-pay | Admitting: Internal Medicine

## 2015-01-22 ENCOUNTER — Encounter: Payer: Medicare Other | Admitting: Cardiovascular Disease

## 2015-01-22 ENCOUNTER — Ambulatory Visit (INDEPENDENT_AMBULATORY_CARE_PROVIDER_SITE_OTHER): Payer: Medicare Other | Admitting: Internal Medicine

## 2015-01-22 VITALS — BP 108/80 | HR 77 | Ht 69.0 in | Wt 184.2 lb

## 2015-01-22 DIAGNOSIS — I4891 Unspecified atrial fibrillation: Secondary | ICD-10-CM | POA: Diagnosis not present

## 2015-01-22 NOTE — Patient Instructions (Signed)
Medication Instructions:  Take 1/2 tablet (20mg ) lasix four times a week  Labwork: None  Testing/Procedures: None  Follow-Up: Your physician recommends that you schedule a follow-up appointment in: 4 months with Dr. Rockey Situ Dr. Caryl Comes will see you on an as needed basis.   Any Other Special Instructions Will Be Listed Below (If Applicable).

## 2015-01-22 NOTE — Progress Notes (Signed)
Patient Care Team: Tonia Ghent, MD as PCP - General (Family Medicine)   HPI  Benjamin Soto is a 79 y.o. male Seen in followup after initial referral for consideration of ICD implantation. There have been no interval significant improvement in functional status a repeat echocardiogram in the last month demonstrated persistent left ventricular dysfunction with an ejection fraction of 20-25%.    Is able to climb a flight of stairs; he says his major limitation is his knees. His wife notes that he cannot keep up with her.  He has some edema  Orthostatic LH and presyncope with abrupt standng  Lasix changed by PCP to 2/w <<3/w  His history of a nonischemic cardiomyopathy confirmed by catheterization 2014. Ejection fraction is measured variably from 20-30%.  Echo 6/15 stable LV dysfunction at 20-25% with moderate mitral regurgitation and biatrial enlargement  He has permanent atrial fibrillation. He is on warfarin anticoagulation.   He underwent cardiopulmonary stress testing demonstrated a marked circulatory limitation with a VO2 max of 13.8 this is also associated with a somewhat brisk    heart rate response with a peak heart rate of 159    Past Medical History  Diagnosis Date  . Gout 04/22/2003    per Dr Jefm Bryant  . Hyperlipidemia 05/22/01  . Knee pain, right     [facility], Dr Jefm Bryant with rheumatology  . History of colon polyps 10/26/02    colonoscopy  . Hypogonadism male   . Elbow fracture, right 2014    "healed by itself"  . Permanent atrial fibrillation 01/20/2003  . Diabetes mellitus, type 2 09/21/92  . History of GI bleed 09/12/2013    Seen by GI at [facility]- felt to be diverticular in origin. Resolved off Coumadin. Cleared to resume per GI.  Marland Kitchen ETOH abuse   . Nonischemic cardiomyopathy     EF 20-25% by 07/2013 echo  . CAD (coronary artery disease)     Nonobstructive by 06/2013 cath  . Anticoagulated on Coumadin   . History of tobacco abuse     Past  Surgical History  Procedure Laterality Date  . Bunionectomy Right 02/2004    R MTP  . Cardiac catheterization  1995    [facility]  . Cardiac catheterization  10/14    [facility]- 50% mid LCx, 40% prox RCA  . Cyst excision  08/07/2013    head  . Appendectomy    . Tonsillectomy      Current Outpatient Prescriptions  Medication Sig Dispense Refill  . albuterol (PROVENTIL HFA;VENTOLIN HFA) 108 (90 BASE) MCG/ACT inhaler Inhale 2 puffs into the lungs every 6 (six) hours as needed for wheezing. 1 Inhaler 0  . allopurinol (ZYLOPRIM) 300 MG tablet Take 1 tablet (300 mg total) by mouth daily. 90 tablet 3  . benzonatate (TESSALON) 200 MG capsule Take 1 capsule (200 mg total) by mouth 3 (three) times daily as needed. 30 capsule 1  . brimonidine (ALPHAGAN) 0.2 % ophthalmic solution Place into both eyes 2 (two) times daily.    . carvedilol (COREG) 12.5 MG tablet Take 1 tablet (12.5 mg total) by mouth 2 (two) times daily with a meal. 180 tablet 3  . doxycycline (VIBRAMYCIN) 100 MG capsule Take 100 mg by mouth 2 (two) times daily.    . furosemide (LASIX) 40 MG tablet Take 1 tab a day on Monday and Thursdays    . glucose blood (ONE TOUCH TEST STRIPS) test strip Use once daily 100  each 3  . ipratropium (ATROVENT) 0.06 % nasal spray Place 2 sprays into both nostrils daily. 15 mL 5  . latanoprost (XALATAN) 0.005 % ophthalmic solution Place 1 drop into both eyes at bedtime.    Lita Mains 0.5-0.3 % SUSP Place 1 drop into both eyes 3 (three) times daily.    . metFORMIN (GLUCOPHAGE) 500 MG tablet Take 1-2 tablets (500-1,000 mg total) by mouth daily with breakfast. 180 tablet 3  . simvastatin (ZOCOR) 20 MG tablet Take 1 tablet (20 mg total) by mouth daily. 90 tablet 3  . warfarin (COUMADIN) 5 MG tablet Take 0.5-1 tab per days as directed 100 tablet 3   No current facility-administered medications for this visit.    Allergies  Allergen Reactions  . Aspirin     GI bleed  .  Lisinopril     REACTION: hyperkalemia at high dose  . Varenicline Tartrate     REACTION: vivid dreams, nausea    Review of Systems negative except from HPI and PMH  Physical Exam BP 108/80 mmHg  Pulse 77  Ht 5\' 9"  (1.753 m)  Wt 184 lb 4 oz (83.575 kg)  BMI 27.20 kg/m2 Well developed and well nourished in no acute distress HENT normal E scleral and icterus clear Neck Supple JVP flat; carotids brisk and full Clear to ausculation  Regular rate and rhythm, no murmurs gallops or rub Soft with active bowel sounds No clubbing cyanosis tr Edema Alert and oriented, grossly normal motor and sensory function Skin Warm and Dry  ECG demonstrates atrial fibrillation at 72 Intervals-/15/46 Right bundle branch left axis deviation  Assessment and  Plan  Atrial fibrillation-permanent  Nonischemic cardiac myopathy  Congestive heart failure-class II   Orhtostatic lightheadedness  Change furosmide 40 2 X /w to 20 4/w to decrease hopefully orthostatic lightheadedness  If necessary, would decrease carvedilol 12.5  Bid >> 6.25/12.5  Continue warfarin  We spent more than 50% of our >25 min visit in face to face counseling regarding the above  Will see prn

## 2015-02-28 ENCOUNTER — Other Ambulatory Visit (INDEPENDENT_AMBULATORY_CARE_PROVIDER_SITE_OTHER): Payer: Medicare Other

## 2015-02-28 DIAGNOSIS — I4891 Unspecified atrial fibrillation: Secondary | ICD-10-CM | POA: Diagnosis not present

## 2015-02-28 DIAGNOSIS — Z7901 Long term (current) use of anticoagulants: Secondary | ICD-10-CM

## 2015-02-28 DIAGNOSIS — Z5181 Encounter for therapeutic drug level monitoring: Secondary | ICD-10-CM

## 2015-02-28 LAB — POCT INR: INR: 2.8

## 2015-03-06 DIAGNOSIS — L718 Other rosacea: Secondary | ICD-10-CM | POA: Diagnosis not present

## 2015-03-06 DIAGNOSIS — H02833 Dermatochalasis of right eye, unspecified eyelid: Secondary | ICD-10-CM | POA: Diagnosis not present

## 2015-03-06 DIAGNOSIS — H2513 Age-related nuclear cataract, bilateral: Secondary | ICD-10-CM | POA: Diagnosis not present

## 2015-03-06 DIAGNOSIS — H01001 Unspecified blepharitis right upper eyelid: Secondary | ICD-10-CM | POA: Diagnosis not present

## 2015-03-26 ENCOUNTER — Other Ambulatory Visit: Payer: Self-pay | Admitting: Family Medicine

## 2015-03-26 ENCOUNTER — Other Ambulatory Visit (INDEPENDENT_AMBULATORY_CARE_PROVIDER_SITE_OTHER): Payer: Medicare Other

## 2015-03-26 DIAGNOSIS — E118 Type 2 diabetes mellitus with unspecified complications: Secondary | ICD-10-CM

## 2015-03-26 LAB — HEMOGLOBIN A1C: Hgb A1c MFr Bld: 5.6 % (ref 4.6–6.5)

## 2015-03-29 ENCOUNTER — Encounter: Payer: Self-pay | Admitting: Family Medicine

## 2015-03-29 ENCOUNTER — Ambulatory Visit (INDEPENDENT_AMBULATORY_CARE_PROVIDER_SITE_OTHER): Payer: Medicare Other | Admitting: *Deleted

## 2015-03-29 ENCOUNTER — Ambulatory Visit (INDEPENDENT_AMBULATORY_CARE_PROVIDER_SITE_OTHER): Payer: Medicare Other | Admitting: Family Medicine

## 2015-03-29 VITALS — BP 102/60 | HR 75 | Temp 97.7°F | Wt 182.5 lb

## 2015-03-29 DIAGNOSIS — Z5181 Encounter for therapeutic drug level monitoring: Secondary | ICD-10-CM | POA: Diagnosis not present

## 2015-03-29 DIAGNOSIS — E118 Type 2 diabetes mellitus with unspecified complications: Secondary | ICD-10-CM | POA: Diagnosis not present

## 2015-03-29 DIAGNOSIS — I4891 Unspecified atrial fibrillation: Secondary | ICD-10-CM

## 2015-03-29 LAB — POCT INR: INR: 3.5

## 2015-03-29 MED ORDER — METFORMIN HCL 500 MG PO TABS: 500.0000 mg | ORAL_TABLET | Freq: Every day | ORAL | Status: DC

## 2015-03-29 NOTE — Progress Notes (Signed)
Pre visit review using our clinic review tool, if applicable. No additional management support is needed unless otherwise documented below in the visit note.  He feels better with split dose of furosemide.  He doesn't have a cough.    DM2.  Taking 1-2 metformin a day.  His meter isn't working.  We talked about cutting his metformin back given the lower A1c.  Rx for meter given to patient. He has had a few possible lows, but his meter doesn't give accurate results. No foot sx.  A1c d/w pt, controlled.    Due for INR.  D/w pt.  No bleeding.    Meds, vitals, and allergies reviewed.   ROS: See HPI.  Otherwise, noncontributory.  GEN: nad, alert and oriented HEENT: mucous membranes moist NECK: supple w/o LA CV: IRR, not tachy PULM: ctab, no inc wob ABD: soft, +bs EXT: no edema SKIN: no acute rash

## 2015-03-29 NOTE — Progress Notes (Signed)
Pre visit review using our clinic review tool, if applicable. No additional management support is needed unless otherwise documented below in the visit note. 

## 2015-03-29 NOTE — Patient Instructions (Signed)
Take care.  Glad to see you.  Recheck in about 6 months, labs before a physical.  Cut back to 1 metformin a day.  If you have a lot of low sugars, then stop it totally.

## 2015-04-01 NOTE — Assessment & Plan Note (Signed)
Recheck A1c in about 6 months, labs before a physical.  Can cut back to 1 metformin a day. If he has a lot of low sugars, then stop it totally.  He agrees.

## 2015-04-07 ENCOUNTER — Other Ambulatory Visit: Payer: Self-pay | Admitting: Internal Medicine

## 2015-04-25 ENCOUNTER — Ambulatory Visit (INDEPENDENT_AMBULATORY_CARE_PROVIDER_SITE_OTHER): Payer: Medicare Other | Admitting: *Deleted

## 2015-04-25 DIAGNOSIS — I4891 Unspecified atrial fibrillation: Secondary | ICD-10-CM

## 2015-04-25 DIAGNOSIS — Z5181 Encounter for therapeutic drug level monitoring: Secondary | ICD-10-CM | POA: Diagnosis not present

## 2015-04-25 LAB — POCT INR: INR: 2.7

## 2015-04-25 NOTE — Progress Notes (Signed)
Pre visit review using our clinic review tool, if applicable. No additional management support is needed unless otherwise documented below in the visit note. 

## 2015-05-17 ENCOUNTER — Telehealth: Payer: Self-pay | Admitting: Family Medicine

## 2015-05-17 NOTE — Telephone Encounter (Signed)
Please call them back.  Rec: otc miralax 1-2 times a day as needed. That should help. Thanks.

## 2015-05-17 NOTE — Telephone Encounter (Signed)
Notified patient's wife of this. She verbalized understanding and said that she would call back after the weekend if this does not help. Thanks!

## 2015-05-17 NOTE — Telephone Encounter (Signed)
I called and spoke with patient's wife. She states that her husband is very constipated, and has not been to the bathroom in a few days. She said that she has tried Dulcolax, as well as making him drink lots of Coffee to try to get his bowels normal again, but she says that nothing is working. Patient's wife would like to know if there is anything you recommend that would help?

## 2015-05-17 NOTE — Telephone Encounter (Signed)
Wife called requesting call back. Please call [phone]

## 2015-05-23 ENCOUNTER — Ambulatory Visit (INDEPENDENT_AMBULATORY_CARE_PROVIDER_SITE_OTHER): Payer: Medicare Other | Admitting: *Deleted

## 2015-05-23 DIAGNOSIS — I4891 Unspecified atrial fibrillation: Secondary | ICD-10-CM

## 2015-05-23 DIAGNOSIS — Z5181 Encounter for therapeutic drug level monitoring: Secondary | ICD-10-CM

## 2015-05-23 LAB — POCT INR: INR: 2.9

## 2015-05-23 NOTE — Progress Notes (Signed)
Pre visit review using our clinic review tool, if applicable. No additional management support is needed unless otherwise documented below in the visit note. 

## 2015-05-28 ENCOUNTER — Ambulatory Visit (INDEPENDENT_AMBULATORY_CARE_PROVIDER_SITE_OTHER): Payer: Medicare Other | Admitting: Cardiovascular Disease

## 2015-05-28 ENCOUNTER — Encounter: Payer: Self-pay | Admitting: Cardiovascular Disease

## 2015-05-28 VITALS — BP 106/66 | HR 89 | Ht 69.0 in | Wt 186.5 lb

## 2015-05-28 DIAGNOSIS — I5022 Chronic systolic (congestive) heart failure: Secondary | ICD-10-CM | POA: Diagnosis not present

## 2015-05-28 DIAGNOSIS — I951 Orthostatic hypotension: Secondary | ICD-10-CM

## 2015-05-28 DIAGNOSIS — F1010 Alcohol abuse, uncomplicated: Secondary | ICD-10-CM

## 2015-05-28 DIAGNOSIS — I479 Paroxysmal tachycardia, unspecified: Secondary | ICD-10-CM | POA: Diagnosis not present

## 2015-05-28 DIAGNOSIS — E118 Type 2 diabetes mellitus with unspecified complications: Secondary | ICD-10-CM

## 2015-05-28 DIAGNOSIS — E785 Hyperlipidemia, unspecified: Secondary | ICD-10-CM

## 2015-05-28 NOTE — Progress Notes (Signed)
Patient ID: Benjamin Soto, male    DOB: 1936-08-25, 79 y.o.   MRN: [id number]  HPI Comments: [name] is a pleasant 79 year old gentleman with a history of chronic atrial fibrillation, long smoking hx, heavy ETOH hx, remote history of SVT, with severe arthritis in his knees,  on warfarin ,  with a history of diabetes, hyperlipidemia, who presents for routine followup, He has nonischemic cardiomyopathy, cardiac catheterization in May 2014 showing nonobstructive CAD, ejection fraction 30-35% Echocardiogram with ejection fraction 20-25% History low testosterone in the past, is not on any supplement Previous recommendations to stop drinking alcohol. He has found this difficult to do.  Lisinopril was held previously secondary to low blood pressures and dizziness.  He presents for follow-up of his cardiomyopathy, hypertension   in follow-up today, he reports his orthostasis symptoms have improved with less Lasix He takes 20 mg 4-5 times per week Denies any lower extremity edema, no shortness of breath He continues to drink significant amounts of alcohol daily. Lab work shows total cholesterol 137, hemoglobin A1c less than 6  EKG shows atrial fibrillation with PVCs, left axis deviation  Other past medical history  In the past, he would drink wine on a regular basis, sometimes in heavy amounts.  History of falls, mechanical fall over the summer 2014  on concrete  Previous episode of confusion, amnesia. Wife witnessed episode 08/09/2013. She felt he was having a stroke. 911 was called and he was transported to [facility].  Carotid ultrasound showed mild bilateral carotid disease. INR at the time was greater than 2. CT scan of the brain, MRI of the brain showed diffuse cortical atrophy, chronic ischemic white matter disease TIA or stroke. EKG 08/09/2013 showing atrial fibrillation with rate 130 beats per minute. He attributed everything to scalp resection procedure done twice with dermatology prior to  the event. He had significant pain around his head prior to the episode. Significant oozing from the wound site. This was fixed on repeat surgery, scraping with additional stitches placed.  Echocardiogram was repeated showing ejection fraction 20-25%, moderate mitral regurgitation, moderate LVH  cholesterol is well controlled.  CT Scan of his abdomen  showed coronary calcifications.   chest x-ray showed calcified granuloma in the right lower lobe which was not seen in 2012 Labs show total cholesterol less than 100, LDL in the 40 range   Allergies  Allergen Reactions  . Lisinopril     REACTION: hyperkalemia at high dose  . Varenicline Tartrate     REACTION: vivid dreams, nausea    Current Outpatient Prescriptions on File Prior to Visit  Medication Sig Dispense Refill  . albuterol (PROVENTIL HFA;VENTOLIN HFA) 108 (90 BASE) MCG/ACT inhaler Inhale 2 puffs into the lungs every 6 (six) hours as needed for wheezing. 1 Inhaler 0  . allopurinol (ZYLOPRIM) 300 MG tablet Take 1 tablet (300 mg total) by mouth daily. 90 tablet 3  . brimonidine (ALPHAGAN) 0.2 % ophthalmic solution Place into both eyes 2 (two) times daily.    . carvedilol (COREG) 12.5 MG tablet Take 1 tablet (12.5 mg total) by mouth 2 (two) times daily with a meal. 180 tablet 3  . furosemide (LASIX) 40 MG tablet Take 1/2 tablet (20mg ) 4 times a week    . glucose blood (ONE TOUCH TEST STRIPS) test strip Use once daily 100 each 3  . ipratropium (ATROVENT) 0.06 % nasal spray Place 2 sprays into both nostrils daily. 15 mL 5  . latanoprost (XALATAN) 0.005 % ophthalmic solution Place 1  drop into both eyes at bedtime.    Marland Kitchen losartan (COZAAR) 25 MG tablet TAKE ONE TABLET BY MOUTH ONCE DAILY 90 tablet 3  . Loteprednol-Tobramycin 0.5-0.3 % SUSP Place 1 drop into both eyes 3 (three) times daily.    . metFORMIN (GLUCOPHAGE) 500 MG tablet Take 1 tablet (500 mg total) by mouth daily with breakfast.    . simvastatin (ZOCOR) 20 MG tablet Take 1  tablet (20 mg total) by mouth daily. 90 tablet 3  . warfarin (COUMADIN) 5 MG tablet Take 0.5-1 tab per days as directed 100 tablet 3   No current facility-administered medications on file prior to visit.    Past Medical History  Diagnosis Date  . Gout 04/22/2003    per Dr Jefm Bryant  . Hyperlipidemia 05/22/01  . Knee pain, right     [facility], Dr Jefm Bryant with rheumatology  . History of colon polyps 10/26/02    colonoscopy  . Hypogonadism male   . Elbow fracture, right 2014    "healed by itself"  . Permanent atrial fibrillation 01/20/2003  . Diabetes mellitus, type 2 09/21/92  . History of GI bleed 09/12/2013    Seen by GI at [facility]- felt to be diverticular in origin. Resolved off Coumadin. Cleared to resume per GI.  Marland Kitchen ETOH abuse   . Nonischemic cardiomyopathy     EF 20-25% by 07/2013 echo  . CAD (coronary artery disease)     Nonobstructive by 06/2013 cath  . Anticoagulated on Coumadin   . History of tobacco abuse     Past Surgical History  Procedure Laterality Date  . Bunionectomy Right 02/2004    R MTP  . Cardiac catheterization  1995    normal, [facility] by [place]  . Cardiac catheterization  10/14    [facility]- 50% mid LCx, 40% prox RCA  . Cyst excision  08/07/2013    head  . Appendectomy    . Tonsillectomy      Social History  reports that he quit smoking about 21 months ago. His smoking use included Cigarettes and Pipe. He has a 11 pack-year smoking history. He has never used smokeless tobacco. He reports that he drinks about 2.4 oz of alcohol per week. He reports that he does not use illicit drugs.  Family History family history includes Colon cancer in his daughter; Diabetes in his father; Healthy in his son; Other in his mother. There is no history of Prostate cancer.   Review of Systems  Constitutional: Negative.   Eyes: Negative.   Respiratory: Negative.   Cardiovascular: Negative.   Gastrointestinal: Negative.   Musculoskeletal: Negative.    Neurological: Negative.   Hematological: Negative.   All other systems reviewed and are negative.  BP 106/66 mmHg  Pulse 89  Ht 5\' 9"  (1.753 m)  Wt 186 lb 8 oz (84.596 kg)  BMI 27.53 kg/m2  Physical Exam  Constitutional: He is oriented to person, place, and time. He appears well-developed and well-nourished.  HENT:  Head: Normocephalic.  Nose: Nose normal.  Mouth/Throat: Oropharynx is clear and moist.  Eyes: Conjunctivae are normal. Pupils are equal, round, and reactive to light.  Neck: Normal range of motion. Neck supple. No JVD present.  Cardiovascular: Normal rate, regular rhythm, S1 normal, S2 normal, normal heart sounds and intact distal pulses.  Exam reveals no gallop and no friction rub.   No murmur heard. Pulmonary/Chest: Effort normal and breath sounds normal. No respiratory distress. He has no wheezes. He has no rales. He  exhibits no tenderness.  Abdominal: Soft. Bowel sounds are normal. He exhibits no distension. There is no tenderness.  Musculoskeletal: Normal range of motion. He exhibits no edema or tenderness.  Lymphadenopathy:    He has no cervical adenopathy.  Neurological: He is alert and oriented to person, place, and time. Coordination normal.  Skin: Skin is warm and dry. No rash noted. No erythema.  Psychiatric: He has a normal mood and affect. His behavior is normal. Judgment and thought content normal.      Assessment and Plan   Nursing note and vitals reviewed.

## 2015-05-28 NOTE — Assessment & Plan Note (Signed)
Hemoglobin A1c well controlled.   

## 2015-05-28 NOTE — Assessment & Plan Note (Signed)
Cholesterol is at goal on the current lipid regimen. No changes to the medications were made.  

## 2015-05-28 NOTE — Assessment & Plan Note (Signed)
We again discussed his alcohol intake and defect on the heart. Recommended alcohol cessation

## 2015-05-28 NOTE — Assessment & Plan Note (Signed)
Improved symptoms with less Lasix We'll continue current regimen

## 2015-05-28 NOTE — Patient Instructions (Signed)
You are doing well. No medication changes were made.  Please call us if you have new issues that need to be addressed before your next appt.  Your physician wants you to follow-up in: 6 months.  You will receive a reminder letter in the mail two months in advance. If you don't receive a letter, please call our office to schedule the follow-up appointment.   

## 2015-05-28 NOTE — Assessment & Plan Note (Signed)
Appears euvolemic on today's visit. We'll continue current Lasix regimen, low-dose losartan, carvedilol.

## 2015-06-07 ENCOUNTER — Ambulatory Visit (INDEPENDENT_AMBULATORY_CARE_PROVIDER_SITE_OTHER): Payer: Medicare Other

## 2015-06-07 DIAGNOSIS — Z23 Encounter for immunization: Secondary | ICD-10-CM | POA: Diagnosis not present

## 2015-06-20 ENCOUNTER — Ambulatory Visit (INDEPENDENT_AMBULATORY_CARE_PROVIDER_SITE_OTHER): Payer: Medicare Other | Admitting: *Deleted

## 2015-06-20 DIAGNOSIS — Z5181 Encounter for therapeutic drug level monitoring: Secondary | ICD-10-CM | POA: Diagnosis not present

## 2015-06-20 DIAGNOSIS — I4891 Unspecified atrial fibrillation: Secondary | ICD-10-CM | POA: Diagnosis not present

## 2015-06-20 LAB — POCT INR: INR: 2.7

## 2015-06-20 NOTE — Progress Notes (Signed)
Pre visit review using our clinic review tool, if applicable. No additional management support is needed unless otherwise documented below in the visit note. 

## 2015-07-12 IMAGING — CT CT HEAD WITHOUT CONTRAST
1 series · 16 of 30 positions shown, 20 images · non-contrast
Comparison: MRI scan of November 11, 2007.

CLINICAL DATA: Altered mental status.

EXAM:
CT HEAD WITHOUT CONTRAST
TECHNIQUE: Contiguous axial images were obtained from the base of the skull
through the vertex without intravenous contrast.

[Series 2: head wo · axial · 0.44mm/px · z∈[+397,+549]mm · 16 of 36 slices shown, 20 images]
[im 2/36  brain]
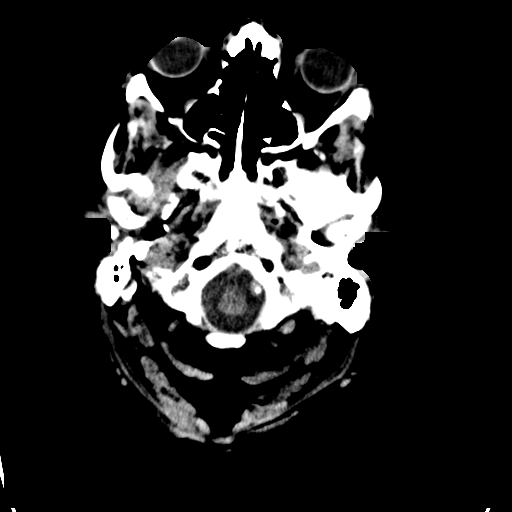
[im 2/36  bone]
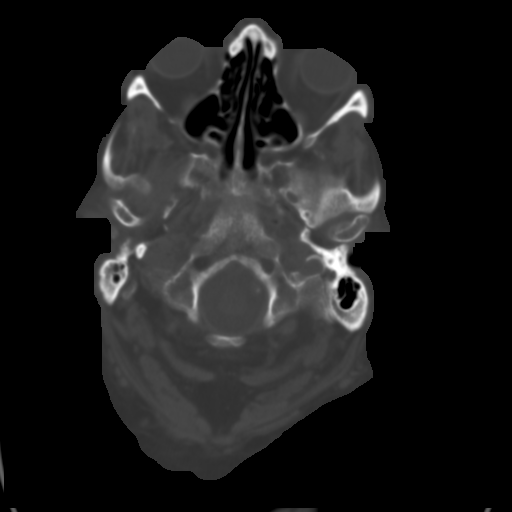
[im 4/36  brain]
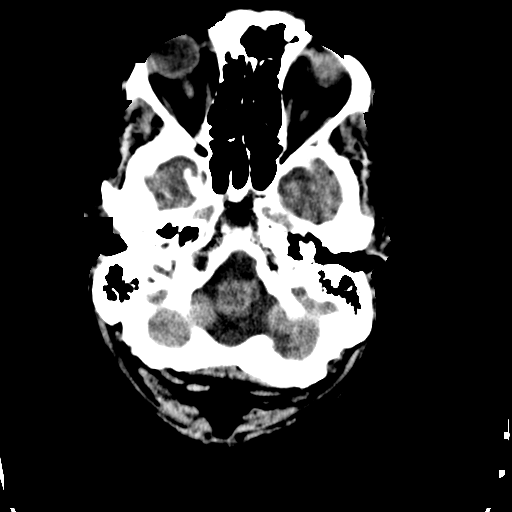
[im 7/36  brain]
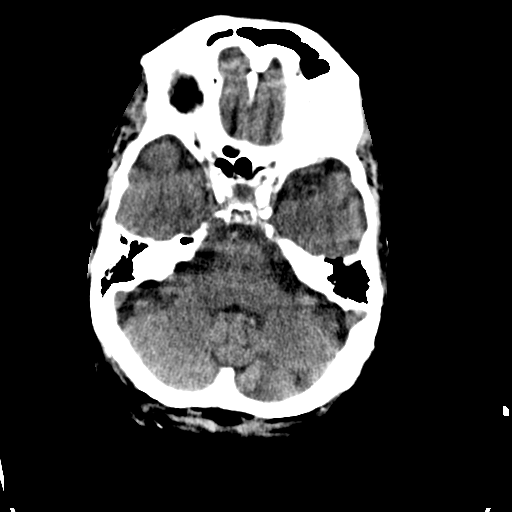
[im 9/36  brain]
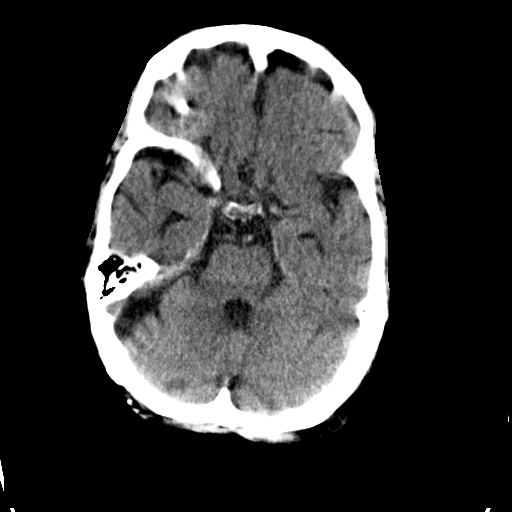
[im 10/36  brain]
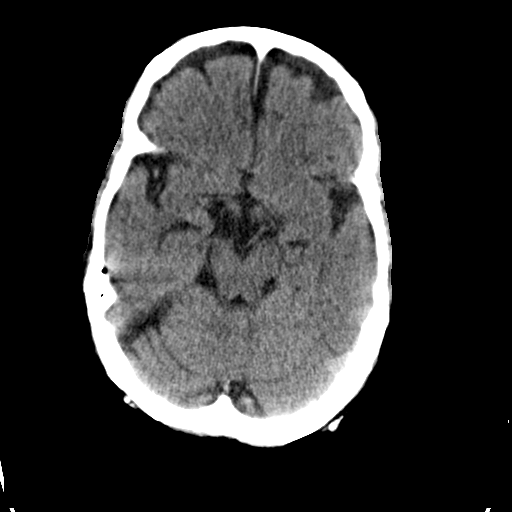
[im 10/36  bone]
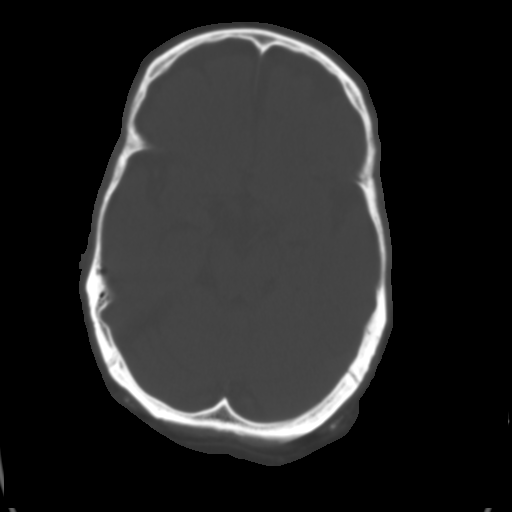
[im 13/36  brain]
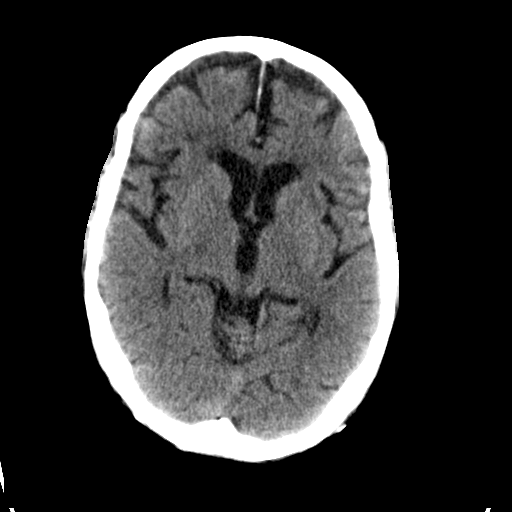
[im 15/36  brain]
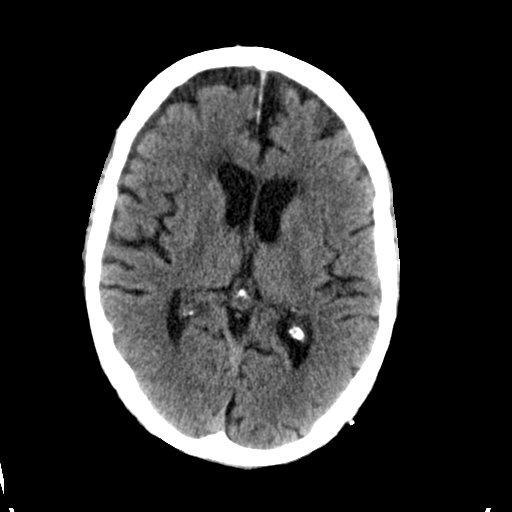
[im 17/36  brain]
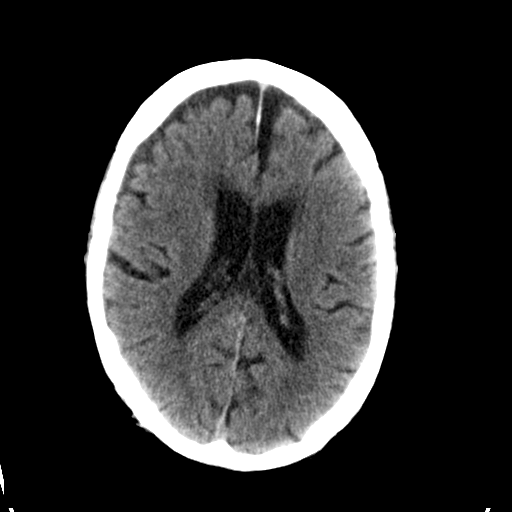
[im 19/36  brain]
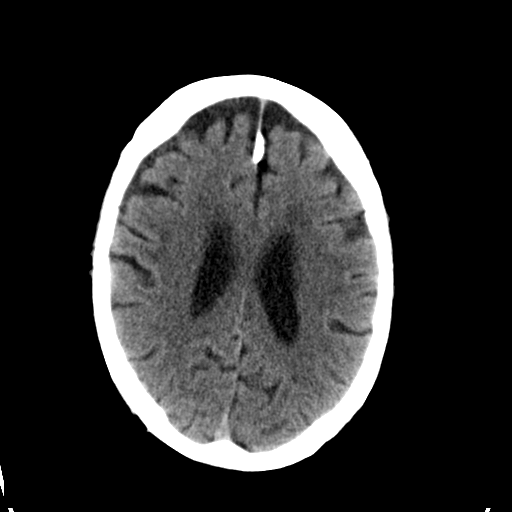
[im 19/36  bone]
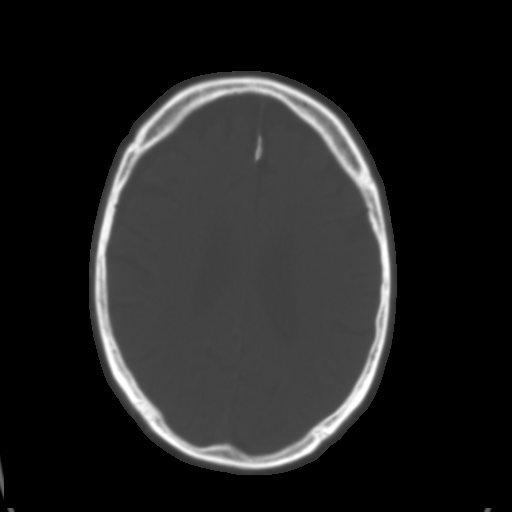
[im 21/36  brain]
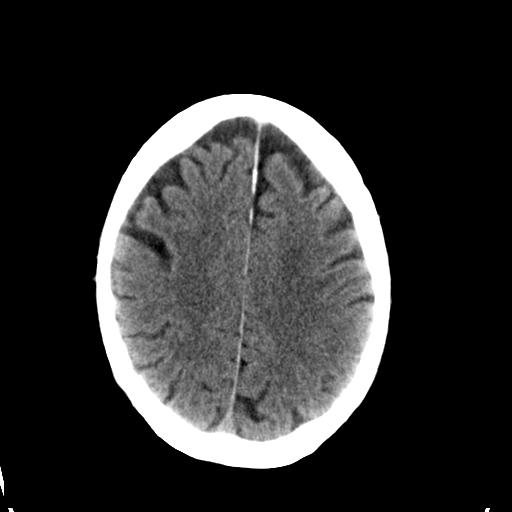
[im 23/36  brain]
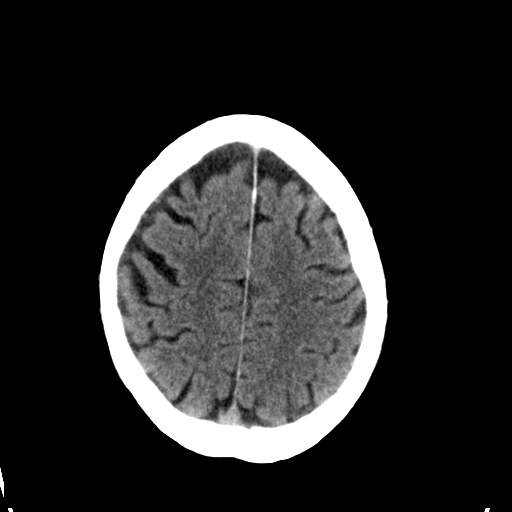
[im 26/36  brain]
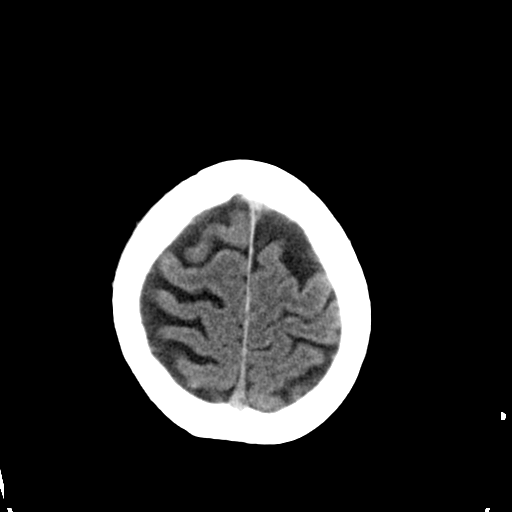
[im 27/36  brain]
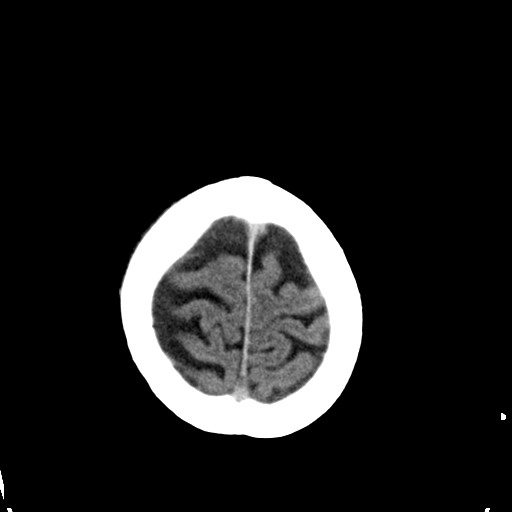
[im 27/36  bone]
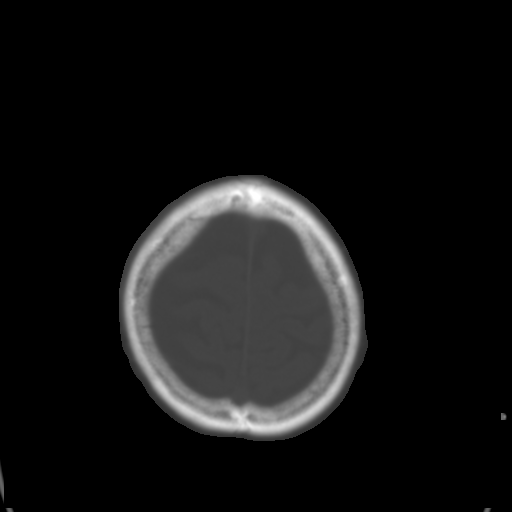
[im 29/36  brain]
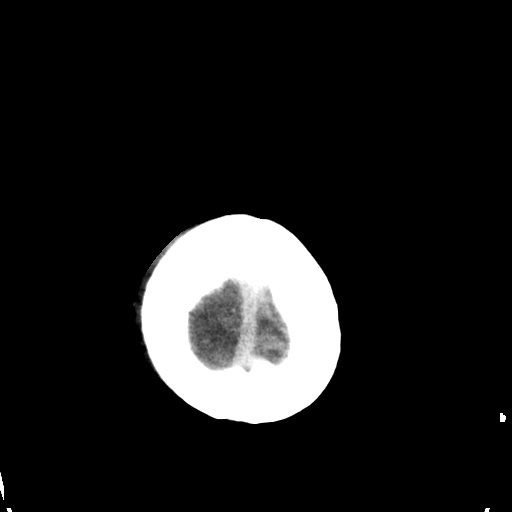
[im 32/36  brain]
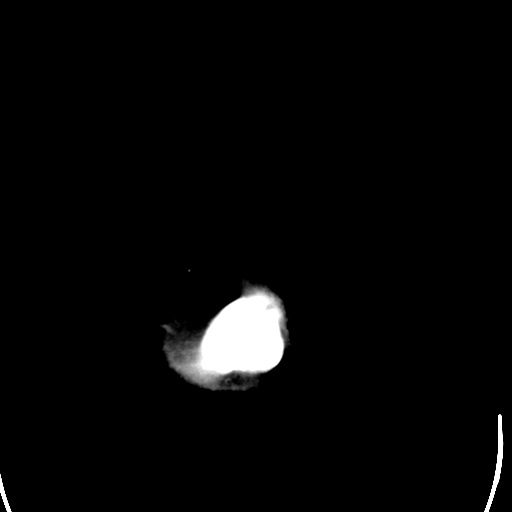
[im 34/36  brain]
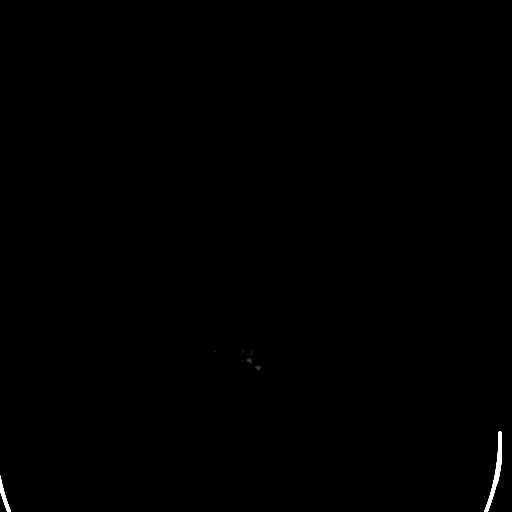

[16 of 30 positions shown; findings below may reference images not displayed]

FINDINGS: Bony calvarium is intact. Diffuse cortical atrophy is noted. Chronic
ischemic white matter disease is noted. No mass effect or midline
shift is noted. Ventricular size is within normal limits. There is
no evidence of mass lesion, hemorrhage or acute infarction.
IMPRESSION: Diffuse cortical atrophy. Chronic ischemic white matter disease. No
acute intracranial abnormality seen.

## 2015-07-18 ENCOUNTER — Ambulatory Visit (INDEPENDENT_AMBULATORY_CARE_PROVIDER_SITE_OTHER): Payer: Medicare Other | Admitting: *Deleted

## 2015-07-18 DIAGNOSIS — Z5181 Encounter for therapeutic drug level monitoring: Secondary | ICD-10-CM | POA: Diagnosis not present

## 2015-07-18 DIAGNOSIS — I4891 Unspecified atrial fibrillation: Secondary | ICD-10-CM | POA: Diagnosis not present

## 2015-07-18 LAB — POCT INR: INR: 2.4

## 2015-07-18 NOTE — Progress Notes (Signed)
Pre visit review using our clinic review tool, if applicable. No additional management support is needed unless otherwise documented below in the visit note. 

## 2015-07-23 DIAGNOSIS — H401131 Primary open-angle glaucoma, bilateral, mild stage: Secondary | ICD-10-CM | POA: Diagnosis not present

## 2015-07-26 DIAGNOSIS — H25011 Cortical age-related cataract, right eye: Secondary | ICD-10-CM | POA: Diagnosis not present

## 2015-07-26 DIAGNOSIS — H01001 Unspecified blepharitis right upper eyelid: Secondary | ICD-10-CM | POA: Diagnosis not present

## 2015-07-26 DIAGNOSIS — H401290 Low-tension glaucoma, unspecified eye, stage unspecified: Secondary | ICD-10-CM | POA: Diagnosis not present

## 2015-07-26 DIAGNOSIS — H11129 Conjunctival concretions, unspecified eye: Secondary | ICD-10-CM | POA: Diagnosis not present

## 2015-07-26 DIAGNOSIS — H401230 Low-tension glaucoma, bilateral, stage unspecified: Secondary | ICD-10-CM | POA: Diagnosis not present

## 2015-07-31 NOTE — Progress Notes (Signed)
This encounter was created in error - please disregard.

## 2015-08-22 ENCOUNTER — Ambulatory Visit (INDEPENDENT_AMBULATORY_CARE_PROVIDER_SITE_OTHER): Payer: Medicare Other | Admitting: *Deleted

## 2015-08-22 DIAGNOSIS — Z5181 Encounter for therapeutic drug level monitoring: Secondary | ICD-10-CM

## 2015-08-22 DIAGNOSIS — I4891 Unspecified atrial fibrillation: Secondary | ICD-10-CM | POA: Diagnosis not present

## 2015-08-22 LAB — POCT INR: INR: 3.4

## 2015-08-22 NOTE — Progress Notes (Signed)
Pre visit review using our clinic review tool, if applicable. No additional management support is needed unless otherwise documented below in the visit note. 

## 2015-09-12 ENCOUNTER — Ambulatory Visit (INDEPENDENT_AMBULATORY_CARE_PROVIDER_SITE_OTHER): Payer: Medicare Other | Admitting: *Deleted

## 2015-09-12 DIAGNOSIS — I4891 Unspecified atrial fibrillation: Secondary | ICD-10-CM

## 2015-09-12 DIAGNOSIS — Z5181 Encounter for therapeutic drug level monitoring: Secondary | ICD-10-CM | POA: Diagnosis not present

## 2015-09-12 LAB — POCT INR: INR: 4.2

## 2015-09-12 NOTE — Progress Notes (Signed)
Pre visit review using our clinic review tool, if applicable. No additional management support is needed unless otherwise documented below in the visit note. 

## 2015-09-14 DIAGNOSIS — J069 Acute upper respiratory infection, unspecified: Secondary | ICD-10-CM | POA: Diagnosis not present

## 2015-09-17 ENCOUNTER — Encounter: Payer: Self-pay | Admitting: Family Medicine

## 2015-09-17 ENCOUNTER — Ambulatory Visit (INDEPENDENT_AMBULATORY_CARE_PROVIDER_SITE_OTHER): Payer: Medicare Other | Admitting: Family Medicine

## 2015-09-17 ENCOUNTER — Ambulatory Visit (INDEPENDENT_AMBULATORY_CARE_PROVIDER_SITE_OTHER): Payer: Medicare Other | Admitting: *Deleted

## 2015-09-17 VITALS — BP 118/68 | HR 81 | Temp 97.4°F | Wt 189.5 lb

## 2015-09-17 DIAGNOSIS — R059 Cough, unspecified: Secondary | ICD-10-CM

## 2015-09-17 DIAGNOSIS — Z5181 Encounter for therapeutic drug level monitoring: Secondary | ICD-10-CM

## 2015-09-17 DIAGNOSIS — I4891 Unspecified atrial fibrillation: Secondary | ICD-10-CM

## 2015-09-17 DIAGNOSIS — R05 Cough: Secondary | ICD-10-CM | POA: Diagnosis not present

## 2015-09-17 LAB — POCT INR: INR: 2.3

## 2015-09-17 MED ORDER — IPRATROPIUM BROMIDE 0.06 % NA SOLN: 2.0000 | Freq: Every day | NASAL | Status: DC

## 2015-09-17 MED ORDER — WARFARIN SODIUM 5 MG PO TABS: ORAL_TABLET | ORAL | Status: DC

## 2015-09-17 MED ORDER — METFORMIN HCL 500 MG PO TABS: 500.0000 mg | ORAL_TABLET | Freq: Every day | ORAL | Status: DC

## 2015-09-17 MED ORDER — HYDROCODONE-HOMATROPINE 5-1.5 MG/5ML PO SYRP: 5.0000 mL | ORAL_SOLUTION | Freq: Three times a day (TID) | ORAL | Status: DC | PRN

## 2015-09-17 MED ORDER — FUROSEMIDE 40 MG PO TABS
ORAL_TABLET | ORAL | Status: DC
Start: 2015-09-17 — End: 2016-01-22

## 2015-09-17 MED ORDER — ALBUTEROL SULFATE HFA 108 (90 BASE) MCG/ACT IN AERS: 2.0000 | INHALATION_SPRAY | Freq: Four times a day (QID) | RESPIRATORY_TRACT | Status: DC | PRN

## 2015-09-17 MED ORDER — ALLOPURINOL 300 MG PO TABS: 300.0000 mg | ORAL_TABLET | Freq: Every day | ORAL | Status: DC

## 2015-09-17 MED ORDER — SIMVASTATIN 20 MG PO TABS: 20.0000 mg | ORAL_TABLET | Freq: Every day | ORAL | Status: DC

## 2015-09-17 MED ORDER — ALLOPURINOL 100 MG PO TABS: 100.0000 mg | ORAL_TABLET | Freq: Every day | ORAL | Status: DC

## 2015-09-17 MED ORDER — LOSARTAN POTASSIUM 25 MG PO TABS: 25.0000 mg | ORAL_TABLET | Freq: Every day | ORAL | Status: DC

## 2015-09-17 NOTE — Progress Notes (Signed)
Pre visit review using our clinic review tool, if applicable. No additional management support is needed unless otherwise documented below in the visit note.  On coumadin.  Cough started about 5 days ago. Wife was sick but she has improved.  No fevers.  Coughing "day and night."  Chest is sore from coughing.  Some sputum, able to clear some and the cough will be better after clearing sputum.  Some occ specks of red in the sputum, no other bleeding.  Had been seen at [facility] about 3 days ago.  Started on zithromax, last dose is due tomorrow.    Meds, vitals, and allergies reviewed.   ROS: See HPI.  Otherwise, noncontributory.  GEN: nad, alert and oriented HEENT: mucous membranes moist, tm w/o erythema, nasal exam w/o erythema, clear discharge noted,  OP with cobblestoning NECK: supple w/o LA CV: IRR, not tachy   PULM: totally ctab, no inc wob, no wheeze, no rhonchi EXT: no edema SKIN: no acute rash

## 2015-09-17 NOTE — Patient Instructions (Signed)
Finish the antibiotics, use the cough syrup, drink plenty of fluids and try to get some rest.   Keep the appointment with Barnett Applebaum next week.  Take care. Glad to see you.   Update me as needed.

## 2015-09-17 NOTE — Progress Notes (Signed)
Pre visit review using our clinic review tool, if applicable. No additional management support is needed unless otherwise documented below in the visit note. 

## 2015-09-18 NOTE — Assessment & Plan Note (Signed)
INR okay today.  Suspect minimal flecks of blood from irritation in the upper airway.  Clearly nontoxic.   Would still finish the abx.  Wouldn't change current meds at this point given clear lungs, but would add on hydrocodone cough syrup with routine cautions.  He agrees.  Update me as needed.  Okay for outpatient f/u.

## 2015-09-26 ENCOUNTER — Ambulatory Visit (INDEPENDENT_AMBULATORY_CARE_PROVIDER_SITE_OTHER): Payer: Medicare Other | Admitting: *Deleted

## 2015-09-26 DIAGNOSIS — I4891 Unspecified atrial fibrillation: Secondary | ICD-10-CM

## 2015-09-26 DIAGNOSIS — Z5181 Encounter for therapeutic drug level monitoring: Secondary | ICD-10-CM

## 2015-09-26 LAB — POCT INR: INR: 2.2

## 2015-09-26 NOTE — Progress Notes (Signed)
Pre visit review using our clinic review tool, if applicable. No additional management support is needed unless otherwise documented below in the visit note. 

## 2015-10-24 ENCOUNTER — Ambulatory Visit: Payer: Medicare Other

## 2015-10-24 ENCOUNTER — Ambulatory Visit (INDEPENDENT_AMBULATORY_CARE_PROVIDER_SITE_OTHER): Payer: Medicare Other | Admitting: *Deleted

## 2015-10-24 DIAGNOSIS — I4891 Unspecified atrial fibrillation: Secondary | ICD-10-CM

## 2015-10-24 DIAGNOSIS — Z5181 Encounter for therapeutic drug level monitoring: Secondary | ICD-10-CM

## 2015-10-24 LAB — POCT INR: INR: 2.3

## 2015-10-24 NOTE — Progress Notes (Signed)
Pre visit review using our clinic review tool, if applicable. No additional management support is needed unless otherwise documented below in the visit note. 

## 2015-10-24 NOTE — Progress Notes (Signed)
INR remains therapeutic.  No changes to therapy.  

## 2015-11-05 ENCOUNTER — Other Ambulatory Visit: Payer: Self-pay | Admitting: *Deleted

## 2015-11-05 MED ORDER — CARVEDILOL 12.5 MG PO TABS: 12.5000 mg | ORAL_TABLET | Freq: Two times a day (BID) | ORAL | Status: DC

## 2015-11-21 ENCOUNTER — Ambulatory Visit (INDEPENDENT_AMBULATORY_CARE_PROVIDER_SITE_OTHER): Payer: Medicare Other | Admitting: *Deleted

## 2015-11-21 DIAGNOSIS — I4891 Unspecified atrial fibrillation: Secondary | ICD-10-CM

## 2015-11-21 DIAGNOSIS — Z5181 Encounter for therapeutic drug level monitoring: Secondary | ICD-10-CM

## 2015-11-21 LAB — POCT INR: INR: 2

## 2015-11-21 NOTE — Progress Notes (Signed)
Pre visit review using our clinic review tool, if applicable. No additional management support is needed unless otherwise documented below in the visit note. 

## 2015-12-19 ENCOUNTER — Ambulatory Visit (INDEPENDENT_AMBULATORY_CARE_PROVIDER_SITE_OTHER): Payer: Medicare Other | Admitting: *Deleted

## 2015-12-19 DIAGNOSIS — Z5181 Encounter for therapeutic drug level monitoring: Secondary | ICD-10-CM

## 2015-12-19 DIAGNOSIS — I4891 Unspecified atrial fibrillation: Secondary | ICD-10-CM | POA: Diagnosis not present

## 2015-12-19 LAB — POCT INR: INR: 2.7

## 2015-12-19 NOTE — Progress Notes (Signed)
Pre visit review using our clinic review tool, if applicable. No additional management support is needed unless otherwise documented below in the visit note. 

## 2015-12-26 ENCOUNTER — Ambulatory Visit (INDEPENDENT_AMBULATORY_CARE_PROVIDER_SITE_OTHER): Payer: Medicare Other | Admitting: Cardiovascular Disease

## 2015-12-26 ENCOUNTER — Encounter: Payer: Self-pay | Admitting: Cardiovascular Disease

## 2015-12-26 VITALS — BP 100/60 | HR 75 | Ht 69.0 in | Wt 184.5 lb

## 2015-12-26 DIAGNOSIS — E118 Type 2 diabetes mellitus with unspecified complications: Secondary | ICD-10-CM

## 2015-12-26 DIAGNOSIS — I4891 Unspecified atrial fibrillation: Secondary | ICD-10-CM

## 2015-12-26 DIAGNOSIS — E785 Hyperlipidemia, unspecified: Secondary | ICD-10-CM

## 2015-12-26 DIAGNOSIS — I10 Essential (primary) hypertension: Secondary | ICD-10-CM

## 2015-12-26 DIAGNOSIS — I5022 Chronic systolic (congestive) heart failure: Secondary | ICD-10-CM | POA: Diagnosis not present

## 2015-12-26 NOTE — Assessment & Plan Note (Signed)
He appears relatively euvolemic on today's visit, weight is stable, takes low-dose Lasix almost daily  Blood pressure low, unable to advance his medications

## 2015-12-26 NOTE — Progress Notes (Signed)
Patient ID: Benjamin Soto, male    DOB: [birth year], 80 y.o.   MRN: [id number]  HPI Comments: Benjamin Soto is a pleasant 80 year old gentleman with a history of chronic atrial fibrillation, long smoking hx, heavy ETOH hx, remote history of SVT, with severe arthritis in his knees,  on warfarin ,  with a history of diabetes, hyperlipidemia, who presents for routine followup, He has nonischemic cardiomyopathy, cardiac catheterization in May 2014 showing nonobstructive CAD, ejection fraction 30-35% Echocardiogram with ejection fraction 20-25% History low testosterone in the past, is not on any supplement Previous recommendations to stop drinking alcohol. He has found this difficult to do.  Lisinopril was held previously secondary to low blood pressures and dizziness.  He presents for follow-up of his cardiomyopathy, hypertension   in follow-up today, he reports that he is doing well  He denies any worsening leg edema, abdominal bloating, orthopnea or PND  He takes Lasix 10 mg 6 days per week  No regular exercise program, feels "lazy" no shortness of breath  alcohol intake was not discussed with him today  previous Lab work  Discussed with him, shows total cholesterol 137, hemoglobin A1c less than 6 , no recent lab work  EKG shows atrial fibrillation with  Right bundle branch block, left axis deviation  Other past medical history  In the past, he would drink wine on a regular basis, sometimes in heavy amounts.  History of falls, mechanical fall over the summer 2014  on concrete  Previous episode of confusion, amnesia. Wife witnessed episode 08/09/2013. She felt he was having a stroke. 911 was called and he was transported to [facility].  Carotid ultrasound showed mild bilateral carotid disease. INR at the time was greater than 2. CT scan of the brain, MRI of the brain showed diffuse cortical atrophy, chronic ischemic white matter disease TIA or stroke. EKG 08/09/2013 showing atrial fibrillation with  rate 130 beats per minute. He attributed everything to scalp resection procedure done twice with dermatology prior to the event. He had significant pain around his head prior to the episode. Significant oozing from the wound site. This was fixed on repeat surgery, scraping with additional stitches placed.  Echocardiogram was repeated showing ejection fraction 20-25%, moderate mitral regurgitation, moderate LVH  cholesterol is well controlled.  CT Scan of his abdomen  showed coronary calcifications.   chest x-ray showed calcified granuloma in the right lower lobe which was not seen in 2012 Labs show total cholesterol less than 100, LDL in the 40 range   Allergies  Allergen Reactions  . Lisinopril     REACTION: hyperkalemia at high dose  . Varenicline Tartrate     REACTION: vivid dreams, nausea    Current Outpatient Prescriptions on File Prior to Visit  Medication Sig Dispense Refill  . albuterol (PROVENTIL HFA;VENTOLIN HFA) 108 (90 Base) MCG/ACT inhaler Inhale 2 puffs into the lungs every 6 (six) hours as needed for wheezing. 1 Inhaler 1  . allopurinol (ZYLOPRIM) 100 MG tablet Take 1 tablet (100 mg total) by mouth daily. 90 tablet 3  . allopurinol (ZYLOPRIM) 300 MG tablet Take 1 tablet (300 mg total) by mouth daily. 90 tablet 3  . azithromycin (ZITHROMAX) 250 MG tablet Take 250 mg by mouth daily.    . brimonidine (ALPHAGAN) 0.2 % ophthalmic solution Place into both eyes 2 (two) times daily.    . carvedilol (COREG) 12.5 MG tablet Take 1 tablet (12.5 mg total) by mouth 2 (two) times daily with a meal. 180 tablet  0  . furosemide (LASIX) 40 MG tablet Take 1/2 tablet (20mg ) 4 times a week (Patient taking differently: Take 1/2 tablet (20mg ) 6 times a week) 30 tablet 2  . glucose blood (ONE TOUCH TEST STRIPS) test strip Use once daily 100 each 3  . HYDROcodone-homatropine (HYCODAN) 5-1.5 MG/5ML syrup Take 5 mLs by mouth 3 (three) times daily as needed for cough. 120 mL 0  . ipratropium  (ATROVENT) 0.06 % nasal spray Place 2 sprays into both nostrils daily. 15 mL 5  . latanoprost (XALATAN) 0.005 % ophthalmic solution Place 1 drop into both eyes at bedtime.    Marland Kitchen losartan (COZAAR) 25 MG tablet Take 1 tablet (25 mg total) by mouth daily. 90 tablet 3  . Loteprednol-Tobramycin 0.5-0.3 % SUSP Place 1 drop into both eyes 3 (three) times daily.    . metFORMIN (GLUCOPHAGE) 500 MG tablet Take 1 tablet (500 mg total) by mouth daily with breakfast. 90 tablet 3  . simvastatin (ZOCOR) 20 MG tablet Take 1 tablet (20 mg total) by mouth daily. 90 tablet 3  . warfarin (COUMADIN) 5 MG tablet Take 0.5-1 tab per days as directed 100 tablet 3   No current facility-administered medications on file prior to visit.    Past Medical History  Diagnosis Date  . Gout 04/22/2003    per Dr Jefm Bryant  . Hyperlipidemia 05/22/01  . Knee pain, right     [facility], Dr Jefm Bryant with rheumatology  . History of colon polyps 10/26/02    colonoscopy  . Hypogonadism male   . Elbow fracture, right 2014    "healed by itself"  . Permanent atrial fibrillation ([place]) 01/20/2003  . Diabetes mellitus, type 2 ([place]) 09/21/92  . History of GI bleed 09/12/2013    Seen by GI at [facility]- felt to be diverticular in origin. Resolved off Coumadin. Cleared to resume per GI.  Marland Kitchen ETOH abuse   . Nonischemic cardiomyopathy ([place])     EF 20-25% by 07/2013 echo  . CAD (coronary artery disease)     Nonobstructive by 06/2013 cath  . Anticoagulated on Coumadin   . History of tobacco abuse     Past Surgical History  Procedure Laterality Date  . Bunionectomy Right 02/2004    R MTP  . Cardiac catheterization  1995    normal, city hospital by [facility]  . Cardiac catheterization  10/14    [facility]- 50% mid LCx, 40% prox RCA  . Cyst excision  08/07/2013    head  . Appendectomy    . Tonsillectomy      Social History  reports that he quit smoking about 2 years ago. His smoking use included Cigarettes and Pipe. He has a 11 pack-year  smoking history. He has never used smokeless tobacco. He reports that he drinks about 2.4 oz of alcohol per week. He reports that he does not use illicit drugs.  Family History family history includes Colon cancer in his daughter; Diabetes in his father; Healthy in his son; Other in his mother. There is no history of Prostate cancer.   Review of Systems  Constitutional: Negative.   Eyes: Negative.   Respiratory: Negative.   Cardiovascular: Negative.   Gastrointestinal: Negative.   Musculoskeletal: Negative.   Neurological: Negative.   Hematological: Negative.   All other systems reviewed and are negative.  BP 100/60 mmHg  Pulse 75  Ht 5\' 9"  (1.753 m)  Wt 184 lb 8 oz (83.689 kg)  BMI 27.23 kg/m2  Physical Exam  Constitutional:  He is oriented to person, place, and time. He appears well-developed and well-nourished.  HENT:  Head: Normocephalic.  Nose: Nose normal.  Mouth/Throat: Oropharynx is clear and moist.  Eyes: Conjunctivae are normal. Pupils are equal, round, and reactive to light.  Neck: Normal range of motion. Neck supple. No JVD present.  Cardiovascular: Normal rate, regular rhythm, S1 normal, S2 normal, normal heart sounds and intact distal pulses.  Exam reveals no gallop and no friction rub.   No murmur heard. Pulmonary/Chest: Effort normal and breath sounds normal. No respiratory distress. He has no wheezes. He has no rales. He exhibits no tenderness.  Abdominal: Soft. Bowel sounds are normal. He exhibits no distension. There is no tenderness.  Musculoskeletal: Normal range of motion. He exhibits no edema or tenderness.  Lymphadenopathy:    He has no cervical adenopathy.  Neurological: He is alert and oriented to person, place, and time. Coordination normal.  Skin: Skin is warm and dry. No rash noted. No erythema.  Psychiatric: He has a normal mood and affect. His behavior is normal. Judgment and thought content normal.      Assessment and Plan   Nursing note  and vitals reviewed.

## 2015-12-26 NOTE — Assessment & Plan Note (Signed)
Heart rate relatively well controlled, tolerating anticoagulation  No changes to his medications

## 2015-12-26 NOTE — Assessment & Plan Note (Signed)
Cholesterol is at goal on the current lipid regimen. No changes to the medications were made.  

## 2015-12-26 NOTE — Patient Instructions (Signed)
You are doing well. No medication changes were made.  Please call us if you have new issues that need to be addressed before your next appt.  Your physician wants you to follow-up in: 6 months.  You will receive a reminder letter in the mail two months in advance. If you don't receive a letter, please call our office to schedule the follow-up appointment.   

## 2015-12-26 NOTE — Assessment & Plan Note (Signed)
Hemoglobin A1c well controlled Recommended  Regular walking regimen

## 2016-01-22 ENCOUNTER — Encounter: Payer: Self-pay | Admitting: Family Medicine

## 2016-01-22 ENCOUNTER — Ambulatory Visit (INDEPENDENT_AMBULATORY_CARE_PROVIDER_SITE_OTHER): Payer: Medicare Other | Admitting: Family Medicine

## 2016-01-22 VITALS — BP 124/84 | HR 74 | Temp 97.7°F | Wt 185.5 lb

## 2016-01-22 DIAGNOSIS — R062 Wheezing: Secondary | ICD-10-CM

## 2016-01-22 MED ORDER — FUROSEMIDE 40 MG PO TABS: ORAL_TABLET | ORAL | Status: DC

## 2016-01-22 MED ORDER — BUDESONIDE-FORMOTEROL FUMARATE 160-4.5 MCG/ACT IN AERO: 2.0000 | INHALATION_SPRAY | Freq: Two times a day (BID) | RESPIRATORY_TRACT | Status: DC

## 2016-01-22 NOTE — Progress Notes (Signed)
Pre visit review using our clinic review tool, if applicable. No additional management support is needed unless otherwise documented below in the visit note.  Cough.  For 2 weeks.  "My wife made me come in."  Tickle in the throat from post nasal gtt.  Dry cough, couldn't get much up.  Started taking mucinex in the meantime.  UAN vs wheeze noted- wife was worried about the noise.  Still on baseline meds.  No fevers.  High pollen counts recently noted.  He has used SABA with some dec in UAN vs wheeze. No vomiting, no diarrhea.  Fatigue noted by wife, they thought the cough was making it hard for him to rest.  He doesn't feel sick.    Meds, vitals, and allergies reviewed.   ROS: Per HPI unless specifically indicated in ROS section   GEN: nad, alert and oriented HEENT: mucous membranes moist, tm w/o erythema, nasal exam w/o erythema, clear discharge noted,  OP with cobblestoning NECK: supple w/o LA CV: RRR, not tachy PULM: faint wheeze noted in all fields w/o UAN, but o/w ctab, no inc wob, no rales EXT: no edema SKIN: no acute rash

## 2016-01-22 NOTE — Assessment & Plan Note (Signed)
I think the combination of previous smoking and springtime allergies is causing the wheeze.  Add on symbicort. Use it twice a day and rinse after use.  Use the albuterol on top of that if needed.  He'll update me if not getting better.  He stopped smoking and may only need symbicort in the spring.  His sx are too mild to need oral pred.   >25 minutes spent in face to face time with patient, >50% spent in counselling or coordination of care.

## 2016-01-22 NOTE — Patient Instructions (Signed)
I think the combination of previous smoking and springtime allergies is causing the wheeze.   Add on symbicort.  Use it twice a day and rinse after use.  Use the albuterol on top of that if needed.  Update me if you aren't getting better.  Take care.  Glad to see you.  Schedule a wellness visit in the summer.

## 2016-01-27 DIAGNOSIS — H25011 Cortical age-related cataract, right eye: Secondary | ICD-10-CM | POA: Diagnosis not present

## 2016-01-27 DIAGNOSIS — H401290 Low-tension glaucoma, unspecified eye, stage unspecified: Secondary | ICD-10-CM | POA: Diagnosis not present

## 2016-01-27 DIAGNOSIS — H25012 Cortical age-related cataract, left eye: Secondary | ICD-10-CM | POA: Diagnosis not present

## 2016-01-27 DIAGNOSIS — H04123 Dry eye syndrome of bilateral lacrimal glands: Secondary | ICD-10-CM | POA: Diagnosis not present

## 2016-01-30 ENCOUNTER — Ambulatory Visit (INDEPENDENT_AMBULATORY_CARE_PROVIDER_SITE_OTHER): Payer: Medicare Other | Admitting: *Deleted

## 2016-01-30 DIAGNOSIS — Z5181 Encounter for therapeutic drug level monitoring: Secondary | ICD-10-CM | POA: Diagnosis not present

## 2016-01-30 DIAGNOSIS — I4891 Unspecified atrial fibrillation: Secondary | ICD-10-CM

## 2016-01-30 LAB — POCT INR: INR: 3

## 2016-01-30 NOTE — Progress Notes (Signed)
Pre visit review using our clinic review tool, if applicable. No additional management support is needed unless otherwise documented below in the visit note. 

## 2016-01-31 ENCOUNTER — Other Ambulatory Visit: Payer: Self-pay | Admitting: Family Medicine

## 2016-02-02 ENCOUNTER — Other Ambulatory Visit: Payer: Self-pay | Admitting: Family Medicine

## 2016-02-18 ENCOUNTER — Telehealth: Payer: Self-pay

## 2016-02-18 MED ORDER — LOSARTAN POTASSIUM 25 MG PO TABS: 12.5000 mg | ORAL_TABLET | Freq: Every day | ORAL | Status: DC

## 2016-02-18 NOTE — Telephone Encounter (Signed)
Wife advised. 

## 2016-02-18 NOTE — Telephone Encounter (Signed)
PLEASE NOTE: All timestamps contained within this report are represented as [place] Standard Time. CONFIDENTIALTY NOTICE: This fax transmission is intended only for the addressee. It contains information that is legally privileged, confidential or otherwise protected from use or disclosure. If you are not the intended recipient, you are strictly prohibited from reviewing, disclosing, copying using or disseminating any of this information or taking any action in reliance on or regarding this information. If you have received this fax in error, please notify us immediately by telephone so that we can arrange for its return to [place]. Phone: [phone], Toll-Free: [phone], Fax: [phone] Page: 1 of 2 Call Id: [id number] [place] Patient Name: Benjamin Soto Gender: Male DOB: 02-14-1936 Age: 80 Y 10 M 10 D Return Phone Number: [phone] (Primary) Address: City/State/Zip: [place] Client [place] Night - Client Client Site [place] Physician Renford Dills - MD Contact Type Call Who Is Calling Patient / Member / Family / Caregiver Call Type Triage / Clinical Caller Name [facility] Andreasen Relationship To Patient Spouse Return Phone Number [phone] (Primary) Chief Complaint BLOOD PRESSURE LOW - Systolic (top number) 90 or less with dizzy or weak symptoms Reason for Call Symptomatic / Request for [place] states her husband has been waking up dizzy, bp 88/83 PreDisposition InappropriateToAsk Translation No Nurse Assessment Nurse: Harlow Mares, RN, Suanne Marker Date/Time (Eastern Time): 02/17/2016 1:00:36 PM Confirm and document reason for call. If symptomatic, describe symptoms. You must click the next button to save text entered. ---Caller states her husband has been waking up dizzy, bp 88/83, takes BP medication.  Worse when he stands up. Caller reports that he has taken all meds this morning. Patient is unsteady when walking. Has the patient traveled out of the country within the last 30 days? ---No Does the patient have any new or worsening symptoms? ---Yes Will a triage be completed? ---Yes Related visit to physician within the last 2 weeks? ---Yes Does the PT have any chronic conditions? (i.e. diabetes, asthma, etc.) ---Yes List chronic conditions. ---HTN, irregular heart beat, diabetic, takes blood thinners Is this a behavioral health or substance abuse call? ---No Guidelines Guideline Title Affirmed Question Affirmed Notes Nurse Date/Time (Eastern Time) Low Blood Pressure [id number] Fall in systolic BP > 20 mm Hg from normal AND [2] dizzy, lightheaded, or weak Harlow Mares, RN, Suanne Marker 02/17/2016 1:03:11 PM PLEASE NOTE: All timestamps contained within this report are represented as [place] Standard Time. CONFIDENTIALTY NOTICE: This fax transmission is intended only for the addressee. It contains information that is legally privileged, confidential or otherwise protected from use or disclosure. If you are not the intended recipient, you are strictly prohibited from reviewing, disclosing, copying using or disseminating any of this information or taking any action in reliance on or regarding this information. If you have received this fax in error, please notify us immediately by telephone so that we can arrange for its return to [place]. Phone: [phone], Toll-Free: [phone], Fax: [phone] Page: 2 of 2 Call Id: [id number] [place]. Time Eilene Ghazi Time) Disposition Final User 02/17/2016 12:59:01 PM Send to Urgent Queue Dalia Heading 02/17/2016 1:08:22 PM Go to ED Now (or PCP triage) Yes Harlow Mares, RN, Rosalyn Charters Understands: Yes Disagree/Comply: Comply Care Advice Given Per Guideline GO TO ED NOW (OR PCP TRIAGE): * IF NO PCP TRIAGE: You need to be seen. Go to the [facility] at _____________ Hospital within  the  next hour. Leave as soon as you can. DRIVING: Another adult should drive. CARE ADVICE given per Low Blood Pressure (Adult) guideline. Referrals [facility] - ED

## 2016-02-18 NOTE — Telephone Encounter (Signed)
I would cut the losartan in half and see if that helps.  Med list updated. Thanks.

## 2016-02-18 NOTE — Telephone Encounter (Signed)
Please try to get up with patient/wife.  Thanks.

## 2016-02-18 NOTE — Telephone Encounter (Signed)
Unable to reach pt or pts family by phone; do not see where pt was seen at ED.

## 2016-02-18 NOTE — Telephone Encounter (Signed)
Mrs. Tagliaferri says that patient's BP has been dropping low at times and he is experiencing dizziness that they feel is associated with it.  Wife says that on Friday, he got up from a seated position to answer the door and barely made it to the door and had to have help into a chair.  Patient stated that he could tell something was not right but the wife says he looked very similar to the time a few years ago when he had a light stroke although he did not lose his memory at this episode.  His BP was at 88/83 at the time.  The nurse at [facility] advised ED but patient insisted on eating something first and once he ate lunch, he felt much better and his BP returned to 110/82.  He then felt fine and refused ED or an appt with MD.  Please advise.

## 2016-03-12 ENCOUNTER — Other Ambulatory Visit (INDEPENDENT_AMBULATORY_CARE_PROVIDER_SITE_OTHER): Payer: Medicare Other

## 2016-03-12 DIAGNOSIS — Z5181 Encounter for therapeutic drug level monitoring: Secondary | ICD-10-CM | POA: Diagnosis not present

## 2016-03-12 DIAGNOSIS — I4891 Unspecified atrial fibrillation: Secondary | ICD-10-CM

## 2016-03-12 LAB — POCT INR: INR: 3.5

## 2016-03-26 ENCOUNTER — Other Ambulatory Visit (INDEPENDENT_AMBULATORY_CARE_PROVIDER_SITE_OTHER): Payer: Medicare Other

## 2016-03-26 DIAGNOSIS — I4891 Unspecified atrial fibrillation: Secondary | ICD-10-CM

## 2016-03-26 DIAGNOSIS — Z5181 Encounter for therapeutic drug level monitoring: Secondary | ICD-10-CM | POA: Diagnosis not present

## 2016-03-26 LAB — POCT INR: INR: 2.7

## 2016-04-03 DIAGNOSIS — M533 Sacrococcygeal disorders, not elsewhere classified: Secondary | ICD-10-CM | POA: Diagnosis not present

## 2016-04-19 ENCOUNTER — Other Ambulatory Visit: Payer: Self-pay | Admitting: Family Medicine

## 2016-04-20 MED ORDER — FUROSEMIDE 40 MG PO TABS: ORAL_TABLET | ORAL | 0 refills | Status: DC

## 2016-04-23 ENCOUNTER — Other Ambulatory Visit: Payer: Self-pay | Admitting: *Deleted

## 2016-04-23 ENCOUNTER — Other Ambulatory Visit (INDEPENDENT_AMBULATORY_CARE_PROVIDER_SITE_OTHER): Payer: Medicare Other

## 2016-04-23 DIAGNOSIS — Z7901 Long term (current) use of anticoagulants: Secondary | ICD-10-CM | POA: Diagnosis not present

## 2016-04-23 DIAGNOSIS — Z5181 Encounter for therapeutic drug level monitoring: Secondary | ICD-10-CM

## 2016-04-23 DIAGNOSIS — I4891 Unspecified atrial fibrillation: Secondary | ICD-10-CM

## 2016-04-23 LAB — POCT INR: INR: 2.3

## 2016-04-23 MED ORDER — FUROSEMIDE 40 MG PO TABS: ORAL_TABLET | ORAL | 0 refills | Status: DC

## 2016-04-29 ENCOUNTER — Other Ambulatory Visit: Payer: Self-pay | Admitting: Family Medicine

## 2016-04-29 DIAGNOSIS — I4891 Unspecified atrial fibrillation: Secondary | ICD-10-CM

## 2016-04-29 DIAGNOSIS — E118 Type 2 diabetes mellitus with unspecified complications: Secondary | ICD-10-CM

## 2016-04-29 DIAGNOSIS — M109 Gout, unspecified: Secondary | ICD-10-CM

## 2016-05-05 ENCOUNTER — Ambulatory Visit (INDEPENDENT_AMBULATORY_CARE_PROVIDER_SITE_OTHER): Payer: Medicare Other

## 2016-05-05 ENCOUNTER — Other Ambulatory Visit (INDEPENDENT_AMBULATORY_CARE_PROVIDER_SITE_OTHER): Payer: Medicare Other

## 2016-05-05 VITALS — BP 120/78 | HR 78 | Temp 97.9°F | Ht 68.0 in | Wt 184.8 lb

## 2016-05-05 DIAGNOSIS — M109 Gout, unspecified: Secondary | ICD-10-CM

## 2016-05-05 DIAGNOSIS — I4891 Unspecified atrial fibrillation: Secondary | ICD-10-CM

## 2016-05-05 DIAGNOSIS — Z0000 Encounter for general adult medical examination without abnormal findings: Secondary | ICD-10-CM

## 2016-05-05 DIAGNOSIS — E118 Type 2 diabetes mellitus with unspecified complications: Secondary | ICD-10-CM

## 2016-05-05 LAB — LIPID PANEL
Cholesterol: 131 mg/dL (ref 0–200)
HDL: 60.9 mg/dL (ref >39.00)
LDL Cholesterol: 51 mg/dL (ref 0–99)
NonHDL: 69.96
Total CHOL/HDL Ratio: 2
Triglycerides: 93 mg/dL (ref 0.0–149.0)
VLDL: 18.6 mg/dL (ref 0.0–40.0)

## 2016-05-05 LAB — CBC WITH DIFFERENTIAL/PLATELET
Basophils Absolute: 0 10*3/uL (ref 0.0–0.1)
Basophils Relative: 0.5 % (ref 0.0–3.0)
Eosinophils Absolute: 0.2 10*3/uL (ref 0.0–0.7)
Eosinophils Relative: 4.8 % (ref 0.0–5.0)
HCT: 40 % (ref 39.0–52.0)
Hemoglobin: 13.4 g/dL (ref 13.0–17.0)
Lymphocytes Relative: 21.2 % (ref 12.0–46.0)
Lymphs Abs: 1.1 10*3/uL (ref 0.7–4.0)
MCHC: 33.4 g/dL (ref 30.0–36.0)
MCV: 95.1 fl (ref 78.0–100.0)
Monocytes Absolute: 0.5 10*3/uL (ref 0.1–1.0)
Monocytes Relative: 9.8 % (ref 3.0–12.0)
Neutro Abs: 3.3 10*3/uL (ref 1.4–7.7)
Neutrophils Relative %: 63.7 % (ref 43.0–77.0)
Platelets: 181 10*3/uL (ref 150.0–400.0)
RBC: 4.2 Mil/uL — ABNORMAL LOW (ref 4.22–5.81)
RDW: 15.5 % (ref 11.5–15.5)
WBC: 5.1 10*3/uL (ref 4.0–10.5)

## 2016-05-05 LAB — HEMOGLOBIN A1C: Hgb A1c MFr Bld: 6.1 % (ref 4.6–6.5)

## 2016-05-05 LAB — COMPREHENSIVE METABOLIC PANEL
ALT: 16 U/L (ref 0–53)
AST: 17 U/L (ref 0–37)
Albumin: 4.1 g/dL (ref 3.5–5.2)
Alkaline Phosphatase: 57 U/L (ref 39–117)
BUN: 20 mg/dL (ref 6–23)
CO2: 28 mEq/L (ref 19–32)
Calcium: 9.3 mg/dL (ref 8.4–10.5)
Chloride: 100 mEq/L (ref 96–112)
Creatinine, Ser: 1.31 mg/dL (ref 0.40–1.50)
GFR: 56.23 mL/min — ABNORMAL LOW (ref >60.00)
Glucose, Bld: 138 mg/dL — ABNORMAL HIGH (ref 70–99)
Potassium: 4.9 mEq/L (ref 3.5–5.1)
Sodium: 135 mEq/L (ref 135–145)
Total Bilirubin: 0.6 mg/dL (ref 0.2–1.2)
Total Protein: 6.2 g/dL (ref 6.0–8.3)

## 2016-05-05 LAB — TSH: TSH: 2.25 u[IU]/mL (ref 0.35–4.50)

## 2016-05-05 LAB — URIC ACID: Uric Acid, Serum: 4 mg/dL (ref 4.0–7.8)

## 2016-05-05 NOTE — Progress Notes (Signed)
Subjective:   Benjamin Soto is a 80 y.o. male who presents for Medicare Annual/Subsequent preventive examination.  Review of Systems:  N/A Cardiac Risk Factors include: advanced age (>68men, >4 women);male gender;diabetes mellitus;dyslipidemia;hypertension     Objective:    Vitals: BP 120/78   Pulse 78   Temp 97.9 F (36.6 C) (Oral)   Ht 5\' 8"  (1.727 m) Comment: no shoes  Wt 184 lb 12 oz (83.8 kg)   SpO2 98%   BMI 28.09 kg/m   Body mass index is 28.09 kg/m.  Tobacco History  Smoking Status  . Former Smoker  . Packs/day: 0.25  . Years: 44.00  . Types: Cigarettes, Pipe  . Quit date: 08/09/2013  Smokeless Tobacco  . Never Used     Counseling given: No   Past Medical History:  Diagnosis Date  . Anticoagulated on Coumadin   . CAD (coronary artery disease)    Nonobstructive by 06/2013 cath  . Diabetes mellitus, type 2 ([place]) 09/21/92  . Elbow fracture, right 2014   "healed by itself"  . ETOH abuse   . Gout 04/22/2003   per Dr Jefm Bryant  . History of colon polyps 10/26/02   colonoscopy  . History of GI bleed 09/12/2013   Seen by GI at [facility]- felt to be diverticular in origin. Resolved off Coumadin. Cleared to resume per GI.  Marland Kitchen History of tobacco abuse   . Hyperlipidemia 05/22/01  . Hypogonadism male   . Knee pain, right    [facility], Dr Jefm Bryant with rheumatology  . Nonischemic cardiomyopathy ([place])    EF 20-25% by 07/2013 echo  . Permanent atrial fibrillation ([place]) 01/20/2003   Past Surgical History:  Procedure Laterality Date  . APPENDECTOMY    . BUNIONECTOMY Right 02/2004   R MTP  . [place]   normal, city hospital by [facility]  . CARDIAC CATHETERIZATION  10/14   [facility]- 50% mid LCx, 40% prox RCA  . CYST EXCISION  08/07/2013   head  . TONSILLECTOMY     Family History  Problem Relation Age of Onset  . Diabetes Father   . Other Mother     Deceased  . Healthy Son   . Colon cancer Daughter     Cancer free  . Prostate cancer Neg  Hx    History  Sexual Activity  . Sexual activity: Not Currently    Outpatient Encounter Prescriptions as of 05/05/2016  Medication Sig  . albuterol (PROVENTIL HFA;VENTOLIN HFA) 108 (90 Base) MCG/ACT inhaler Inhale 2 puffs into the lungs every 6 (six) hours as needed for wheezing.  Marland Kitchen allopurinol (ZYLOPRIM) 100 MG tablet Take 100 mg by mouth daily.  Marland Kitchen allopurinol (ZYLOPRIM) 300 MG tablet Take 1 tablet (300 mg total) by mouth daily.  . brimonidine (ALPHAGAN) 0.2 % ophthalmic solution Place into both eyes 2 (two) times daily.  . budesonide-formoterol (SYMBICORT) 160-4.5 MCG/ACT inhaler Inhale 2 puffs into the lungs 2 (two) times daily. Rinse after use.  . carvedilol (COREG) 12.5 MG tablet Take 1 tablet (12.5 mg total) by mouth 2 (two) times daily with a meal.  . furosemide (LASIX) 40 MG tablet 20mg  per day as needed.  Marland Kitchen glucose blood (ONE TOUCH TEST STRIPS) test strip Use once daily  . latanoprost (XALATAN) 0.005 % ophthalmic solution Place 1 drop into both eyes at bedtime.  Marland Kitchen losartan (COZAAR) 25 MG tablet Take 0.5 tablets (12.5 mg total) by mouth daily.  . metFORMIN (GLUCOPHAGE) 500 MG tablet Take 1 tablet (  500 mg total) by mouth daily with breakfast.  . simvastatin (ZOCOR) 20 MG tablet Take 1 tablet (20 mg total) by mouth daily.  Marland Kitchen warfarin (COUMADIN) 5 MG tablet Take 0.5-1 tab per days as directed  . [DISCONTINUED] allopurinol (ZYLOPRIM) 100 MG tablet Take 1 tablet (100 mg total) by mouth daily.  . [DISCONTINUED] ipratropium (ATROVENT) 0.06 % nasal spray Place 2 sprays into both nostrils daily.  . [DISCONTINUED] Loteprednol-Tobramycin 0.5-0.3 % SUSP Place 1 drop into both eyes 3 (three) times daily.   No facility-administered encounter medications on file as of 05/05/2016.     Activities of Daily Living In your present state of health, do you have any difficulty performing the following activities: 05/05/2016  Hearing? Y  Vision? N  Difficulty concentrating or making decisions? N    Walking or climbing stairs? N  Dressing or bathing? N  Doing errands, shopping? N  Preparing Food and eating ? N  Using the Toilet? N  In the past six months, have you accidently leaked urine? N  Do you have problems with loss of bowel control? N  Managing your Medications? N  Managing your Finances? N  Housekeeping or managing your Housekeeping? N  Some recent data might be hidden    Patient Care Team: Tonia Ghent, MD as PCP - General (Family Medicine) Minna Merritts, MD as Consulting Physician (Cardiology) Darleen Crocker, MD as Consulting Physician (Ophthalmology)   Assessment:    Hearing Screening Comments: Wears bilateral hearing aids Vision Screening Comments: Last eye exam with Dr. Talbert Forest in March 2017  Exercise Activities and Dietary recommendations Current Exercise Habits: Home exercise routine, Type of exercise: calisthenics, Time (Minutes): 30, Frequency (Times/Week): 7, Weekly Exercise (Minutes/Week): 210, Intensity: Mild, Exercise limited by: None identified  Goals    . Increase water intake          Starting 05/05/2016, I will attempt to drink at least 6 glasses of water daily.       Fall Risk Fall Risk  05/05/2016 09/27/2014 01/08/2014 02/06/2013 02/06/2013  Falls in the past year? Yes No No Yes No  Number falls in past yr: 1 - - 1 -  Injury with Fall? Yes - - - -  Risk for fall due to : History of fall(s);Impaired balance/gait - - - -  Follow up Falls evaluation completed - - - -   Depression Screen PHQ 2/9 Scores 05/05/2016 09/27/2014 01/08/2014 02/06/2013  PHQ - 2 Score 0 0 0 0    Cognitive Testing MMSE - Mini Mental State Exam 05/05/2016  Orientation to time 5  Orientation to Place 5  Registration 3  Attention/ Calculation 0  Recall 3  Language- name 2 objects 0  Language- repeat 1  Language- follow 3 step command 3  Language- read & follow direction 0  Write a sentence 0  Copy design 0  Total score 20   PLEASE NOTE: A Mini-Cog screen was  completed. Maximum score is 20. A value of 0 denotes this part of [place] MMSE was not completed or the patient failed this part of the Mini-Cog screening.   Mini-Cog Screening Orientation to Time - Max 5 pts Orientation to Place - Max 5 pts Registration - Max 3 pts Recall - Max 3 pts Language Repeat - Max 1 pts Language Follow 3 Step Command - Max 3 pts   Immunization History  Administered Date(s) Administered  . Influenza Split 07/07/2011, 06/16/2012  . Influenza Whole 06/21/2006, 06/23/2007, 06/24/2007, 06/20/2008, 06/18/2009, 06/26/2010  .  Influenza,inj,Quad PF,36+ Mos 05/31/2013, 05/31/2014, 06/07/2015  . Pneumococcal Conjugate-13 09/27/2014  . Pneumococcal Polysaccharide-23 07/17/2009  . Td 02/20/2003  . Zoster 07/31/2011   Screening Tests Health Maintenance  Topic Date Due  . FOOT EXAM  05/12/2016 (Originally 09/28/2015)  . INFLUENZA VACCINE  09/20/2016 (Originally 04/21/2016)  . TETANUS/TDAP  05/05/2017 (Originally 02/19/2013)  . HEMOGLOBIN A1C  11/05/2016  . OPHTHALMOLOGY EXAM  12/03/2016  . ZOSTAVAX  Completed  . PNA vac Low Risk Adult  Completed      Plan:     I have personally reviewed and addressed the Medicare Annual Wellness questionnaire and have noted the following in the patient's chart:  A. Medical and social history B. Use of alcohol, tobacco or illicit drugs  C. Current medications and supplements D. Functional ability and status E.  Nutritional status F.  Physical activity G. Advance directives H. List of other physicians I.  Hospitalizations, surgeries, and ER visits in previous 12 months J.  [place] to include hearing, vision, cognitive, depression L. Referrals and appointments - none  In addition, I have reviewed and discussed with patient certain preventive protocols, quality metrics, and best practice recommendations. A written personalized care plan for preventive services as well as general preventive health recommendations were  provided to patient.  See attached scanned questionnaire for additional information.   Signed,   Lindell Noe, MHA, BS, LPN Health Advisor

## 2016-05-05 NOTE — Patient Instructions (Signed)
Benjamin Soto , Thank you for taking time to come for your Medicare Wellness Visit. I appreciate your ongoing commitment to your health goals. Please review the following plan we discussed and let me know if I can assist you in the future.   These are the goals we discussed: Goals    . Increase water intake          Starting 05/05/2016, I will attempt to drink at least 6 glasses of water daily.        This is a list of the screening recommended for you and due dates:  Health Maintenance  Topic Date Due  . Complete foot exam   05/12/2016*  . Flu Shot  09/20/2016*  . Tetanus Vaccine  05/05/2017*  . Hemoglobin A1C  11/05/2016  . Eye exam for diabetics  12/03/2016  . Shingles Vaccine  Completed  . Pneumonia vaccines  Completed  *Topic was postponed. The date shown is not the original due date.   Preventive Care for Adults  A healthy lifestyle and preventive care can promote health and wellness. Preventive health guidelines for adults include the following key practices.  . A routine yearly physical is a good way to check with your health care provider about your health and preventive screening. It is a chance to share any concerns and updates on your health and to receive a thorough exam.  . Visit your dentist for a routine exam and preventive care every 6 months. Brush your teeth twice a day and floss once a day. Good oral hygiene prevents tooth decay and gum disease.  . The frequency of eye exams is based on your age, health, family medical history, use  of contact lenses, and other factors. Follow your health care provider's ecommendations for frequency of eye exams.  . Eat a healthy diet. Foods like vegetables, fruits, whole grains, low-fat dairy products, and lean protein foods contain the nutrients you need without too many calories. Decrease your intake of foods high in solid fats, added sugars, and salt. Eat the right amount of calories for you. Get information about a proper diet  from your health care provider, if necessary.  . Regular physical exercise is one of the most important things you can do for your health. Most adults should get at least 150 minutes of moderate-intensity exercise (any activity that increases your heart rate and causes you to sweat) each week. In addition, most adults need muscle-strengthening exercises on 2 or more days a week.  Silver Sneakers may be a benefit available to you. To determine eligibility, you may visit the website: www.silversneakers.com or contact program at 858-883-6280 Mon-Fri between 8AM-8PM.   . Maintain a healthy weight. The body mass index (BMI) is a screening tool to identify possible weight problems. It provides an estimate of body fat based on height and weight. Your health care provider can find your BMI and can help you achieve or maintain a healthy weight.   For adults 20 years and older: ? A BMI below 18.5 is considered underweight. ? A BMI of 18.5 to 24.9 is normal. ? A BMI of 25 to 29.9 is considered overweight. ? A BMI of 30 and above is considered obese.   . Maintain normal blood lipids and cholesterol levels by exercising and minimizing your intake of saturated fat. Eat a balanced diet with plenty of fruit and vegetables. Blood tests for lipids and cholesterol should begin at age 56 and be repeated every 5 years. If your lipid  or cholesterol levels are high, you are over 50, or you are at high risk for heart disease, you may need your cholesterol levels checked more frequently. Ongoing high lipid and cholesterol levels should be treated with medicines if diet and exercise are not working.  . If you smoke, find out from your health care provider how to quit. If you do not use tobacco, please do not start.  . If you choose to drink alcohol, please do not consume more than 2 drinks per day. One drink is considered to be 12 ounces (355 mL) of beer, 5 ounces (148 mL) of wine, or 1.5 ounces (44 mL) of liquor.  .  If you are 74-45 years old, ask your health care provider if you should take aspirin to prevent strokes.  . Use sunscreen. Apply sunscreen liberally and repeatedly throughout the day. You should seek shade when your shadow is shorter than you. Protect yourself by wearing long sleeves, pants, a wide-brimmed hat, and sunglasses year round, whenever you are outdoors.  . Once a month, do a whole body skin exam, using a mirror to look at the skin on your back. Tell your health care provider of new moles, moles that have irregular borders, moles that are larger than a pencil eraser, or moles that have changed in shape or color.

## 2016-05-05 NOTE — Progress Notes (Signed)
Pre visit review using our clinic review tool, if applicable. No additional management support is needed unless otherwise documented below in the visit note. 

## 2016-05-05 NOTE — Progress Notes (Signed)
PCP notes:   Health maintenance:  Flu vaccine - addressed Tetanus- postponed/insurance Foot exam - will complete at CPE Eye exam - per pt, exam was in Mar 2017  Abnormal screenings:   Fall risk - hx of fall with injury  Patient concerns:   None  Nurse concerns:  None  Next PCP appt:   05/12/16 @ [id number]  I reviewed health advisor's note, was available for consultation on the day of service listed in this note, and agree with documentation and plan. Elsie Stain, MD.

## 2016-05-12 ENCOUNTER — Ambulatory Visit (INDEPENDENT_AMBULATORY_CARE_PROVIDER_SITE_OTHER): Payer: Medicare Other | Admitting: Family Medicine

## 2016-05-12 ENCOUNTER — Encounter: Payer: Self-pay | Admitting: Family Medicine

## 2016-05-12 VITALS — BP 122/70 | HR 81 | Temp 97.8°F | Ht 68.0 in | Wt 187.2 lb

## 2016-05-12 DIAGNOSIS — I4891 Unspecified atrial fibrillation: Secondary | ICD-10-CM

## 2016-05-12 DIAGNOSIS — M109 Gout, unspecified: Secondary | ICD-10-CM | POA: Diagnosis not present

## 2016-05-12 DIAGNOSIS — E785 Hyperlipidemia, unspecified: Secondary | ICD-10-CM

## 2016-05-12 DIAGNOSIS — I10 Essential (primary) hypertension: Secondary | ICD-10-CM

## 2016-05-12 DIAGNOSIS — Z1211 Encounter for screening for malignant neoplasm of colon: Secondary | ICD-10-CM | POA: Diagnosis not present

## 2016-05-12 DIAGNOSIS — R059 Cough, unspecified: Secondary | ICD-10-CM

## 2016-05-12 DIAGNOSIS — E118 Type 2 diabetes mellitus with unspecified complications: Secondary | ICD-10-CM

## 2016-05-12 DIAGNOSIS — R05 Cough: Secondary | ICD-10-CM

## 2016-05-12 NOTE — Patient Instructions (Addendum)
Check with your insurance to see if they will cover the tetanus shot. I would get a flu shot each fall.   Stop losartan for now.  Update me in about 2 weeks.  Let me know about your BP and if you are still lightheaded on standing.  Take care.  Glad to see you.

## 2016-05-12 NOTE — Progress Notes (Signed)
Pre visit review using our clinic review tool, if applicable. No additional management support is needed unless otherwise documented below in the visit note. 

## 2016-05-12 NOTE — Progress Notes (Signed)
Diabetes:  Using medications without difficulties:yes Hypoglycemic episodes:no Hyperglycemic episodes:no Feet problems:no Blood Sugars averaging: usually ~110s on occ checks.   eye exam within last year: yes Labs d/w pt.   Hypertension:  Using medication without problems or lightheadedness: has been lightheaded on standing.  D/w pt.  Chest pain with exertion:no Edema:no Short of breath:no  Defer colon cancer screening given age, lack of pathology prev on exams for patient and current anticoagulation.  D/w pt.    Gout. No flares.  Compliant with med.  No ADE on med.    Likely COPD.  Compliant with inhalers usually, d/w pt.  No ADE on meds.  Rare SABA use.  Some cough in the afternoon but not early AM or at night.  Not a lot of wheeze.   Elevated Cholesterol: Using medications without problems:yes Muscle aches: no Diet compliance:yes Exercise: some, encouraged more Lipids at goal.    Anticoagulation.  No ADE on med.  D/w pt.  Likely with benefit >> risk at this point.  No falls, no bleeding.  CBC wnl.    PMH and SH reviewed.   Vital signs, Meds and allergies reviewed.  ROS: Per HPI unless specifically indicated in ROS section   GEN: nad, alert and oriented HEENT: mucous membranes moist NECK: supple w/o LA CV: IRR, no tachy PULM: ctab, no inc wob ABD: soft, +bs EXT: no edema SKIN: no acute rash  Diabetic foot exam: Normal inspection No skin breakdown No calluses  Normal DP pulses Normal sensation to light tough and monofilament Nails normal

## 2016-05-13 DIAGNOSIS — Z1211 Encounter for screening for malignant neoplasm of colon: Secondary | ICD-10-CM | POA: Insufficient documentation

## 2016-05-13 NOTE — Assessment & Plan Note (Signed)
Controlled continue as is.

## 2016-05-13 NOTE — Assessment & Plan Note (Signed)
Compliant. No flares. Continue as is. He agrees labs discussed with patient

## 2016-05-13 NOTE — Assessment & Plan Note (Signed)
See hypertension discussion.  No ADE on med.  D/w pt.  Likely with benefit >> risk at this point.  No falls, no bleeding.  CBC wnl.  Continue as is.

## 2016-05-13 NOTE — Assessment & Plan Note (Signed)
A1c controlled. No adverse events. Continue as is. Recheck periodically. He agrees

## 2016-05-13 NOTE — Assessment & Plan Note (Signed)
Occasionally lightheaded. We'll stop ARB. Continue beta blocker. He'll update me about his blood pressure. I did not want to stop his beta blocker given his A. fib history. All discussed with patient and he agrees

## 2016-05-13 NOTE — Assessment & Plan Note (Signed)
Defer colon cancer screening given age, lack of pathology prev on exams for patient and current anticoagulation.  D/w pt.

## 2016-05-13 NOTE — Assessment & Plan Note (Signed)
Likely with a component of COPD. Discuss with patient about routine inhaler use, prophylactic versus abortive treatment. Continue as is with when necessary albuterol use. Continue daily Symbicort use.

## 2016-05-28 ENCOUNTER — Other Ambulatory Visit (INDEPENDENT_AMBULATORY_CARE_PROVIDER_SITE_OTHER): Payer: Medicare Other

## 2016-05-28 DIAGNOSIS — Z7901 Long term (current) use of anticoagulants: Secondary | ICD-10-CM

## 2016-05-28 DIAGNOSIS — I4891 Unspecified atrial fibrillation: Secondary | ICD-10-CM

## 2016-05-28 DIAGNOSIS — Z5181 Encounter for therapeutic drug level monitoring: Secondary | ICD-10-CM | POA: Diagnosis not present

## 2016-05-28 LAB — POCT INR: INR: 2.4

## 2016-06-10 ENCOUNTER — Ambulatory Visit (INDEPENDENT_AMBULATORY_CARE_PROVIDER_SITE_OTHER): Payer: Medicare Other

## 2016-06-10 DIAGNOSIS — Z23 Encounter for immunization: Secondary | ICD-10-CM

## 2016-06-15 DIAGNOSIS — M19019 Primary osteoarthritis, unspecified shoulder: Secondary | ICD-10-CM | POA: Diagnosis not present

## 2016-06-15 DIAGNOSIS — M7542 Impingement syndrome of left shoulder: Secondary | ICD-10-CM | POA: Diagnosis not present

## 2016-06-25 ENCOUNTER — Other Ambulatory Visit (INDEPENDENT_AMBULATORY_CARE_PROVIDER_SITE_OTHER): Payer: Medicare Other

## 2016-06-25 DIAGNOSIS — Z7901 Long term (current) use of anticoagulants: Secondary | ICD-10-CM

## 2016-06-25 DIAGNOSIS — Z5181 Encounter for therapeutic drug level monitoring: Secondary | ICD-10-CM

## 2016-06-25 DIAGNOSIS — I4891 Unspecified atrial fibrillation: Secondary | ICD-10-CM | POA: Diagnosis not present

## 2016-06-25 LAB — POCT INR: INR: 2

## 2016-07-26 ENCOUNTER — Other Ambulatory Visit: Payer: Self-pay | Admitting: Family Medicine

## 2016-07-30 ENCOUNTER — Other Ambulatory Visit (INDEPENDENT_AMBULATORY_CARE_PROVIDER_SITE_OTHER): Payer: Medicare Other

## 2016-07-30 ENCOUNTER — Other Ambulatory Visit: Payer: Medicare Other

## 2016-07-30 ENCOUNTER — Ambulatory Visit (INDEPENDENT_AMBULATORY_CARE_PROVIDER_SITE_OTHER): Payer: Medicare Other

## 2016-07-30 DIAGNOSIS — Z5181 Encounter for therapeutic drug level monitoring: Secondary | ICD-10-CM

## 2016-07-30 DIAGNOSIS — Z7901 Long term (current) use of anticoagulants: Secondary | ICD-10-CM | POA: Diagnosis not present

## 2016-07-30 DIAGNOSIS — I4891 Unspecified atrial fibrillation: Secondary | ICD-10-CM | POA: Diagnosis not present

## 2016-07-30 LAB — POCT INR: INR: 1.5

## 2016-07-30 NOTE — Patient Instructions (Signed)
Pre visit review using our clinic review tool, if applicable. No additional management support is needed unless otherwise documented below in the visit note.  Patient's INR 1.5 today.  Patient will take an extra 1/2 pill today and tomorrow and resume prior dosing. Recheck in 1 week.  Educated on risks of subtherapeutic level and patient verbalized understanding of all instructions.

## 2016-08-07 ENCOUNTER — Ambulatory Visit (INDEPENDENT_AMBULATORY_CARE_PROVIDER_SITE_OTHER): Payer: Medicare Other

## 2016-08-07 DIAGNOSIS — Z5181 Encounter for therapeutic drug level monitoring: Secondary | ICD-10-CM

## 2016-08-07 LAB — POCT INR: INR: 1.8

## 2016-08-07 NOTE — Patient Instructions (Addendum)
Pre visit review using our clinic review tool, if applicable. No additional management support is needed unless otherwise documented below in the visit note.  Patient INR 1.8 today.  As this is the second check coming in at a subtherapeutic level the following adjustments were made:   Patient to take an extra 1/2 pill today only and then increase regular dosing to : (1) 5mg  pill daily except 2.5 mg (1/2) pill on Saturdays only now. (This is an increase from prior dosing of 2.5mg  on Wednesdays/Saturdays and 5mg  Sun, Mon, Tues, Thur, Fri).  Patient denies any changes in medications, diet or possible missed dosages and verbalizes understanding of risks associated with subtherapeutic level.   Will recheck in 1- 2 weeks on 08/18/16.

## 2016-08-08 NOTE — Progress Notes (Signed)
Agree 

## 2016-08-18 ENCOUNTER — Ambulatory Visit: Payer: Medicare Other

## 2016-08-21 ENCOUNTER — Ambulatory Visit (INDEPENDENT_AMBULATORY_CARE_PROVIDER_SITE_OTHER): Payer: Medicare Other

## 2016-08-21 DIAGNOSIS — Z5181 Encounter for therapeutic drug level monitoring: Secondary | ICD-10-CM | POA: Diagnosis not present

## 2016-08-21 LAB — POCT INR: INR: 2.6

## 2016-08-21 NOTE — Patient Instructions (Signed)
Pre visit review using our clinic review tool, if applicable. No additional management support is needed unless otherwise documented below in the visit note. 

## 2016-08-22 NOTE — Progress Notes (Signed)
Agree. Thanks

## 2016-09-18 ENCOUNTER — Ambulatory Visit: Payer: Medicare Other

## 2016-09-20 ENCOUNTER — Other Ambulatory Visit: Payer: Self-pay | Admitting: Family Medicine

## 2016-09-22 ENCOUNTER — Ambulatory Visit (INDEPENDENT_AMBULATORY_CARE_PROVIDER_SITE_OTHER): Payer: Medicare Other

## 2016-09-22 DIAGNOSIS — I4891 Unspecified atrial fibrillation: Secondary | ICD-10-CM | POA: Diagnosis not present

## 2016-09-22 LAB — POCT INR: INR: 2.3

## 2016-09-22 NOTE — Patient Instructions (Signed)
Pre visit review using our clinic review tool, if applicable. No additional management support is needed unless otherwise documented below in the visit note. 

## 2016-09-27 NOTE — Progress Notes (Signed)
Agree. Thanks

## 2016-10-04 ENCOUNTER — Other Ambulatory Visit: Payer: Self-pay | Admitting: Family Medicine

## 2016-10-05 ENCOUNTER — Other Ambulatory Visit: Payer: Self-pay | Admitting: Family Medicine

## 2016-10-05 NOTE — Telephone Encounter (Signed)
See drug warning between Allopurinol and Warfarin Okay to refill?

## 2016-10-05 NOTE — Telephone Encounter (Signed)
Sent. Thanks.   

## 2016-10-13 ENCOUNTER — Other Ambulatory Visit: Payer: Self-pay | Admitting: Family Medicine

## 2016-10-13 NOTE — Telephone Encounter (Signed)
Received refill request electronically Last office visit 05/12/16 Medication is no longer on med list Okay to refill?

## 2016-10-14 ENCOUNTER — Ambulatory Visit (INDEPENDENT_AMBULATORY_CARE_PROVIDER_SITE_OTHER): Payer: Medicare Other | Admitting: Cardiovascular Disease

## 2016-10-14 ENCOUNTER — Encounter: Payer: Self-pay | Admitting: Cardiovascular Disease

## 2016-10-14 VITALS — BP 104/64 | HR 80 | Ht 69.0 in | Wt 189.5 lb

## 2016-10-14 DIAGNOSIS — E782 Mixed hyperlipidemia: Secondary | ICD-10-CM

## 2016-10-14 DIAGNOSIS — I482 Chronic atrial fibrillation, unspecified: Secondary | ICD-10-CM

## 2016-10-14 DIAGNOSIS — I471 Supraventricular tachycardia: Secondary | ICD-10-CM

## 2016-10-14 DIAGNOSIS — F1010 Alcohol abuse, uncomplicated: Secondary | ICD-10-CM | POA: Insufficient documentation

## 2016-10-14 DIAGNOSIS — I5022 Chronic systolic (congestive) heart failure: Secondary | ICD-10-CM

## 2016-10-14 DIAGNOSIS — I2510 Atherosclerotic heart disease of native coronary artery without angina pectoris: Secondary | ICD-10-CM

## 2016-10-14 DIAGNOSIS — E118 Type 2 diabetes mellitus with unspecified complications: Secondary | ICD-10-CM

## 2016-10-14 NOTE — Telephone Encounter (Signed)
Verify with patient.  If still using/needed, okay to continue and send in.  Thanks.

## 2016-10-14 NOTE — Progress Notes (Signed)
Cardiology Office Note  Date:  10/14/2016   ID:  Benjamin Soto, DOB [birth year], MRN [id number]  PCP:  Elsie Stain, MD   Chief Complaint  Patient presents with  . other    6 month f/u no complaints today. Meds reviewed verbally with pt.    HPI:  Benjamin Soto is a pleasant 81 year-old gentleman with a history of chronic atrial fibrillation, long smoking hx, heavy ETOH hx, remote history of SVT, dilated cardiomyopathy,with severe arthritis in his knees,  on warfarin ,  with a history of diabetes, hyperlipidemia, who presents for routine followup Of his nonischemic cardiomyopathy cardiac catheterization in May 2014 showing nonobstructive CAD, ejection fraction 30-35% Echocardiogram with ejection fraction 20-25%, last evaluated in 2015 History low testosterone in the past, is not on any supplement Previous recommendations to stop drinking alcohol. He has found this difficult to do.  Lisinopril was held previously secondary to low blood pressures and dizziness.  He presents for follow-up of his cardiomyopathy, hypertension   in follow-up today, he reports that he is doing well He is taking lasix 10 mg 4 days a week  denies any worsening leg edema, abdominal bloating, orthopnea or PND Denies any weight gain, attributes 5 pound weight gain from August 2016 to shoes and jacket     jacketNo regular exercise program, denies shortness of breath  alcohol intake was not discussed with him today, continues to drink per the wife  previous Lab work  Discussed with him, HBA1C 6.1 Total chol 131, LDL 51  EKG shows atrial fibrillation with  Right bundle branch block, left axis deviation, rate 80 bpm  Other past medical history   drinks wine on a regular basis, sometimes in heavy amounts.  History of falls, mechanical fall over the summer 2014  on concrete  Previous episode of confusion, amnesia. Wife witnessed episode 08/09/2013. She felt he was having a stroke. 911 was called and he was  transported to [facility].  Carotid ultrasound showed mild bilateral carotid disease. INR at the time was greater than 2. CT scan of the brain, MRI of the brain showed diffuse cortical atrophy, chronic ischemic white matter disease TIA or stroke. EKG 08/09/2013 showing atrial fibrillation with rate 130 beats per minute. He attributed everything to scalp resection procedure done twice with dermatology prior to the event. He had significant pain around his head prior to the episode. Significant oozing from the wound site. This was fixed on repeat surgery, scraping with additional stitches placed.  Echocardiogram last in mid 2015  showing ejection fraction 20-25%, moderate mitral regurgitation, moderate LVH  CT Scan of his abdomen  showed coronary calcifications.   chest x-ray showed calcified granuloma in the right lower lobe which was not seen in 2012 Labs show total cholesterol less than 100, LDL in the 40 range    PMH:   has a past medical history of Anticoagulated on Coumadin; CAD (coronary artery disease); Diabetes mellitus, type 2 ([place]) (09/21/92); Elbow fracture, right (2014); Gout (04/22/2003); History of colon polyps (10/26/02); History of GI bleed (09/12/2013); History of tobacco abuse; Hyperlipidemia (05/22/01); Hypertension; Hypogonadism male; Knee pain, right; Nonischemic cardiomyopathy ([place]); and Permanent atrial fibrillation ([place]) (01/20/2003).  PSH:    Past Surgical History:  Procedure Laterality Date  . APPENDECTOMY    . BUNIONECTOMY Right 02/2004   R MTP  . [place]   [facility]  . CARDIAC CATHETERIZATION  10/14   [facility]- 50% mid LCx, 40% prox RCA  .  CYST EXCISION  08/07/2013   head  . TONSILLECTOMY      Current Outpatient Prescriptions  Medication Sig Dispense Refill  . albuterol (PROVENTIL HFA;VENTOLIN HFA) 108 (90 Base) MCG/ACT inhaler Inhale 2 puffs into the lungs every 6 (six) hours as needed for wheezing. 1 Inhaler 1  . allopurinol  (ZYLOPRIM) 100 MG tablet TAKE 1 TABLET BY MOUTH EVERY DAY 90 tablet 3  . allopurinol (ZYLOPRIM) 300 MG tablet TAKE 1 TABLET BY MOUTH EVERY DAY 90 tablet 3  . brimonidine (ALPHAGAN) 0.2 % ophthalmic solution Place into both eyes 2 (two) times daily.    . budesonide-formoterol (SYMBICORT) 160-4.5 MCG/ACT inhaler Inhale 2 puffs into the lungs 2 (two) times daily. Rinse after use. 1 Inhaler 3  . carvedilol (COREG) 12.5 MG tablet TAKE 1 TABLET (12.5 MG TOTAL) BY MOUTH 2 (TWO) TIMES DAILY WITH A MEAL. 180 tablet 3  . furosemide (LASIX) 40 MG tablet 20mg  per day as needed. 90 tablet 0  . glucose blood (ONE TOUCH TEST STRIPS) test strip Use once daily 100 each 3  . latanoprost (XALATAN) 0.005 % ophthalmic solution Place 1 drop into both eyes at bedtime.    . metFORMIN (GLUCOPHAGE) 500 MG tablet TAKE 1 TABLET BY MOUTH ONCE DAILY WITH BREAKFAST 90 tablet 0  . simvastatin (ZOCOR) 20 MG tablet Take 1 tablet (20 mg total) by mouth daily. 90 tablet 3  . warfarin (COUMADIN) 5 MG tablet TAKE 1/2-1 TABLET BY MOUTH DAILY AS DIRECTED 100 tablet 0   No current facility-administered medications for this visit.      Allergies:   Lisinopril and Varenicline tartrate   Social History:  The patient  reports that he quit smoking about 3 years ago. His smoking use included Cigarettes and Pipe. He has a 11.00 pack-year smoking history. He has never used smokeless tobacco. He reports that he drinks about 2.4 oz of alcohol per week . He reports that he does not use drugs.   Family History:   family history includes Colon cancer in his daughter; Diabetes in his father; Healthy in his son; Other in his mother.    Review of Systems: Review of Systems  Constitutional: Negative.   Respiratory: Negative.   Cardiovascular: Negative.   Gastrointestinal: Negative.   Musculoskeletal: Negative.   Neurological: Positive for dizziness.  Psychiatric/Behavioral: Negative.   All other systems reviewed and are  negative.    PHYSICAL EXAM: VS:  BP 104/64 (BP Location: Left Arm, Patient Position: Sitting, Cuff Size: Normal)   Pulse 80   Ht 5\' 9"  (1.753 m)   Wt 189 lb 8 oz (86 kg)   BMI 27.98 kg/m  , BMI Body mass index is 27.98 kg/m. GEN: Well nourished, well developed, in no acute distress  HEENT: normal  Neck: no JVD, carotid bruits, or masses Cardiac: RRR; no murmurs, rubs, or gallops,no edema  Respiratory:  clear to auscultation bilaterally, normal work of breathing GI: soft, nontender, nondistended, + BS MS: no deformity or atrophy  Skin: warm and dry, no rash Neuro:  Strength and sensation are intact Psych: euthymic mood, full affect    Recent Labs: 05/05/2016: ALT 16; BUN 20; Creatinine, Ser 1.31; Hemoglobin 13.4; Platelets 181.0; Potassium 4.9; Sodium 135; TSH 2.25    Lipid Panel Lab Results  Component Value Date   CHOL 131 05/05/2016   HDL 60.90 05/05/2016   LDLCALC 51 05/05/2016   TRIG 93.0 05/05/2016      Wt Readings from Last 3 Encounters:  10/14/16 189 lb  8 oz (86 kg)  05/12/16 187 lb 4 oz (84.9 kg)  05/05/16 184 lb 12 oz (83.8 kg)       ASSESSMENT AND PLAN:  Chronic atrial fibrillation (HCC) - Plan: EKG 12-Lead Tolerating warfarin, no recent falls, bleeding  Mixed hyperlipidemia Cholesterol is at goal on the current lipid regimen. No changes to the medications were made.  Systolic CHF, chronic ([place]) Long discussion concerning depressed ejection fraction, last evaluated 2015. He does not want repeat echocardiogram at this time Unable to advance his medications such as add ACE inhibitor, ARB given hypotension  Paroxysmal SVT (supraventricular tachycardia) (HCC) Remote history of SVT, none recently Discussed adenosine administration if he has recurrent symptoms  Coronary artery disease involving native coronary artery of native heart without angina pectoris Currently with no symptoms of angina. No further workup at this time. Continue current  medication regimen.  Type 2 diabetes mellitus with complication, without long-term current use of insulin (HCC) Hemoglobin A1c well controlled, weight is stable Recommended exercise program  Alcohol abuse Recommended alcohol cessation, likely contributing to dilated cardiomyopathy   Total encounter time more than 25 minutes  Greater than 50% was spent in counseling and coordination of care with the patient   Disposition:   F/U  6 months   Orders Placed This Encounter  Procedures  . EKG 12-Lead     Signed, Esmond Plants, M.D., Ph.D. 10/14/2016  [place], Stephen

## 2016-10-14 NOTE — Patient Instructions (Signed)

## 2016-10-14 NOTE — Telephone Encounter (Signed)
Spoke to pt. He is taking the medication and asked for the refill. Rx sent electronically.

## 2016-10-20 ENCOUNTER — Ambulatory Visit (INDEPENDENT_AMBULATORY_CARE_PROVIDER_SITE_OTHER): Payer: Medicare Other

## 2016-10-20 DIAGNOSIS — Z5181 Encounter for therapeutic drug level monitoring: Secondary | ICD-10-CM

## 2016-10-20 LAB — POCT INR: INR: 1.9

## 2016-10-20 NOTE — Patient Instructions (Signed)
Pre visit review using our clinic review tool, if applicable. No additional management support is needed unless otherwise documented below in the visit note. 

## 2016-10-25 NOTE — Progress Notes (Signed)
Agree. Thanks

## 2016-11-08 ENCOUNTER — Other Ambulatory Visit: Payer: Self-pay | Admitting: Family Medicine

## 2016-11-08 NOTE — Telephone Encounter (Signed)
Last office visit 05/12/16.  Losartan not on current medication list.  Refill?

## 2016-11-09 NOTE — Telephone Encounter (Signed)
Spoke to patient's wife and was advised that patient is taking a nap and she is not too sure about the medication. Patient's wife stated that she will have him call back.

## 2016-11-09 NOTE — Telephone Encounter (Signed)
Please verify with patient or pharmacy.  Had been stopped prev.  See if this was an auto refill request or if was requested by patient.  Thanks.

## 2016-11-10 NOTE — Telephone Encounter (Signed)
Benjamin Soto left v/m requesting cb about losartan. I spoke with Benjamin Soto and he said he has restarted taking Losartan 25 mg again 2 months ago. Pt request refill of Losartan 25 mg taking one tab daily to [facility].

## 2016-11-11 NOTE — Telephone Encounter (Signed)
Sent. Thanks.   

## 2016-11-17 ENCOUNTER — Ambulatory Visit (INDEPENDENT_AMBULATORY_CARE_PROVIDER_SITE_OTHER): Payer: Medicare Other

## 2016-11-17 DIAGNOSIS — Z5181 Encounter for therapeutic drug level monitoring: Secondary | ICD-10-CM | POA: Diagnosis not present

## 2016-11-17 LAB — POCT INR: INR: 2.5

## 2016-11-17 NOTE — Patient Instructions (Signed)
Pre visit review using our clinic review tool, if applicable. No additional management support is needed unless otherwise documented below in the visit note. 

## 2016-11-18 NOTE — Progress Notes (Signed)
Agree. Thanks

## 2016-11-20 ENCOUNTER — Other Ambulatory Visit: Payer: Self-pay | Admitting: Family Medicine

## 2016-11-26 DIAGNOSIS — H11129 Conjunctival concretions, unspecified eye: Secondary | ICD-10-CM | POA: Diagnosis not present

## 2016-11-26 DIAGNOSIS — H401231 Low-tension glaucoma, bilateral, mild stage: Secondary | ICD-10-CM | POA: Diagnosis not present

## 2016-11-26 DIAGNOSIS — H04123 Dry eye syndrome of bilateral lacrimal glands: Secondary | ICD-10-CM | POA: Diagnosis not present

## 2016-11-26 DIAGNOSIS — H01001 Unspecified blepharitis right upper eyelid: Secondary | ICD-10-CM | POA: Diagnosis not present

## 2016-12-15 ENCOUNTER — Ambulatory Visit (INDEPENDENT_AMBULATORY_CARE_PROVIDER_SITE_OTHER): Payer: Medicare Other

## 2016-12-15 DIAGNOSIS — Z5181 Encounter for therapeutic drug level monitoring: Secondary | ICD-10-CM | POA: Diagnosis not present

## 2016-12-15 LAB — POCT INR: INR: 3

## 2016-12-15 NOTE — Patient Instructions (Signed)
Pre visit review using our clinic review tool, if applicable. No additional management support is needed unless otherwise documented below in the visit note. 

## 2016-12-19 NOTE — Progress Notes (Signed)
Agree. Thanks

## 2016-12-27 ENCOUNTER — Other Ambulatory Visit: Payer: Self-pay | Admitting: Family Medicine

## 2016-12-29 NOTE — Telephone Encounter (Signed)
Patient is compliant with coumadin management.  Will ref X 3 months per protocol

## 2017-01-12 ENCOUNTER — Ambulatory Visit (INDEPENDENT_AMBULATORY_CARE_PROVIDER_SITE_OTHER): Payer: Medicare Other

## 2017-01-12 DIAGNOSIS — Z5181 Encounter for therapeutic drug level monitoring: Secondary | ICD-10-CM | POA: Diagnosis not present

## 2017-01-12 LAB — POCT INR: INR: 2.2

## 2017-01-12 NOTE — Patient Instructions (Signed)
Pre visit review using our clinic review tool, if applicable. No additional management support is needed unless otherwise documented below in the visit note. 

## 2017-01-15 ENCOUNTER — Other Ambulatory Visit: Payer: Self-pay | Admitting: Family Medicine

## 2017-01-16 NOTE — Progress Notes (Signed)
Agree. Thanks

## 2017-02-05 ENCOUNTER — Encounter: Payer: Self-pay | Admitting: Family Medicine

## 2017-02-05 ENCOUNTER — Ambulatory Visit (INDEPENDENT_AMBULATORY_CARE_PROVIDER_SITE_OTHER): Payer: Medicare Other | Admitting: Family Medicine

## 2017-02-05 VITALS — BP 108/58 | HR 79 | Temp 97.4°F | Wt 189.5 lb

## 2017-02-05 DIAGNOSIS — Z23 Encounter for immunization: Secondary | ICD-10-CM

## 2017-02-05 DIAGNOSIS — S81802A Unspecified open wound, left lower leg, initial encounter: Secondary | ICD-10-CM

## 2017-02-05 MED ORDER — CEPHALEXIN 500 MG PO CAPS: 500.0000 mg | ORAL_CAPSULE | Freq: Three times a day (TID) | ORAL | 0 refills | Status: DC

## 2017-02-05 NOTE — Patient Instructions (Addendum)
If you are getting lightheaded then let me know.   Benjamin Soto will call about your referral. Start keflex. Don't change your coumadin dose for now.  We'll be in touch about checking your INR next week.  Take care.  Glad to see you.

## 2017-02-05 NOTE — Progress Notes (Signed)
He denies being lightheaded.  He fell about 2 weeks ago.  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  Bumped his head and tailbone and L leg.  No loss of consciousness. He is back to normal except he has a lesion on the left shin that will not heal. On Coumadin.  Meds, vitals, and allergies reviewed.   ROS: Per HPI unless specifically indicated in ROS section   nad L shin with abrasion with central 2.5x3 cm scab, not bleeding. Minimal discharge that is clear. Some mild erythema around the periphery, but per patient report this has increased recently. Minimal tenderness around the edge of the scab. No fluctuant mass.

## 2017-02-06 DIAGNOSIS — S81802S Unspecified open wound, left lower leg, sequela: Secondary | ICD-10-CM | POA: Insufficient documentation

## 2017-02-06 DIAGNOSIS — S81802A Unspecified open wound, left lower leg, initial encounter: Secondary | ICD-10-CM | POA: Insufficient documentation

## 2017-02-06 NOTE — Assessment & Plan Note (Addendum)
This isn't healing well. Refer to wound clinic. Start Keflex. Will notify anticoagulation clinic regarding shorter-term INR follow-up. I did not change his Coumadin the meantime. No bleeding. Tetanus updated today. Appreciate help all involved. Keep area covered with a clean nonstick bandage. Discussed with patient. He agrees.

## 2017-02-09 ENCOUNTER — Telehealth: Payer: Self-pay

## 2017-02-09 ENCOUNTER — Other Ambulatory Visit
Admission: RE | Admit: 2017-02-09 | Discharge: 2017-02-09 | Disposition: A | Discharge status: Home / Self Care | Payer: Medicare Other | Source: Ambulatory Visit | Attending: Internal Medicine | Admitting: Internal Medicine

## 2017-02-09 ENCOUNTER — Encounter: Payer: Medicare Other | Attending: Internal Medicine | Admitting: Internal Medicine

## 2017-02-09 DIAGNOSIS — J449 Chronic obstructive pulmonary disease, unspecified: Secondary | ICD-10-CM | POA: Diagnosis not present

## 2017-02-09 DIAGNOSIS — Z1611 Resistance to penicillins: Secondary | ICD-10-CM | POA: Insufficient documentation

## 2017-02-09 DIAGNOSIS — B952 Enterococcus as the cause of diseases classified elsewhere: Secondary | ICD-10-CM | POA: Diagnosis not present

## 2017-02-09 DIAGNOSIS — E11610 Type 2 diabetes mellitus with diabetic neuropathic arthropathy: Secondary | ICD-10-CM | POA: Diagnosis not present

## 2017-02-09 DIAGNOSIS — H409 Unspecified glaucoma: Secondary | ICD-10-CM | POA: Insufficient documentation

## 2017-02-09 DIAGNOSIS — Z1622 Resistance to vancomycin related antibiotics: Secondary | ICD-10-CM | POA: Insufficient documentation

## 2017-02-09 DIAGNOSIS — F17200 Nicotine dependence, unspecified, uncomplicated: Secondary | ICD-10-CM | POA: Diagnosis not present

## 2017-02-09 DIAGNOSIS — I509 Heart failure, unspecified: Secondary | ICD-10-CM | POA: Insufficient documentation

## 2017-02-09 DIAGNOSIS — N189 Chronic kidney disease, unspecified: Secondary | ICD-10-CM | POA: Insufficient documentation

## 2017-02-09 DIAGNOSIS — I130 Hypertensive heart and chronic kidney disease with heart failure and stage 1 through stage 4 chronic kidney disease, or unspecified chronic kidney disease: Secondary | ICD-10-CM | POA: Insufficient documentation

## 2017-02-09 DIAGNOSIS — W19XXXD Unspecified fall, subsequent encounter: Secondary | ICD-10-CM | POA: Insufficient documentation

## 2017-02-09 DIAGNOSIS — M1990 Unspecified osteoarthritis, unspecified site: Secondary | ICD-10-CM | POA: Insufficient documentation

## 2017-02-09 DIAGNOSIS — Z7901 Long term (current) use of anticoagulants: Secondary | ICD-10-CM | POA: Insufficient documentation

## 2017-02-09 DIAGNOSIS — E1122 Type 2 diabetes mellitus with diabetic chronic kidney disease: Secondary | ICD-10-CM | POA: Insufficient documentation

## 2017-02-09 DIAGNOSIS — S81812D Laceration without foreign body, left lower leg, subsequent encounter: Secondary | ICD-10-CM | POA: Insufficient documentation

## 2017-02-09 DIAGNOSIS — X58XXXA Exposure to other specified factors, initial encounter: Secondary | ICD-10-CM | POA: Diagnosis not present

## 2017-02-09 DIAGNOSIS — L97223 Non-pressure chronic ulcer of left calf with necrosis of muscle: Secondary | ICD-10-CM | POA: Diagnosis not present

## 2017-02-09 DIAGNOSIS — S81802A Unspecified open wound, left lower leg, initial encounter: Secondary | ICD-10-CM | POA: Diagnosis not present

## 2017-02-09 DIAGNOSIS — S80922A Unspecified superficial injury of left lower leg, initial encounter: Secondary | ICD-10-CM | POA: Diagnosis not present

## 2017-02-09 DIAGNOSIS — I4891 Unspecified atrial fibrillation: Secondary | ICD-10-CM | POA: Insufficient documentation

## 2017-02-09 DIAGNOSIS — Z1624 Resistance to multiple antibiotics: Secondary | ICD-10-CM | POA: Insufficient documentation

## 2017-02-09 NOTE — Telephone Encounter (Signed)
Spoke with patient and rescheduled his next INR for 02/11/17 due to acute abx (Keflex) use.    Patient verbalizes understanding and will keep appointment for this Thursday.

## 2017-02-10 NOTE — Progress Notes (Signed)
NOA, CONSTANTE ([id number]) Visit Report for 02/09/2017 Chief Complaint Document Details Patient Name: Benjamin Soto, Benjamin Soto Date of Service: 02/09/2017 8:00 AM Medical Record Number: [id number] Patient Account Number: [id number] Date of Birth/Sex: 05/08/1936 (81 y.o. Male) Treating RN: Montey Hora Primary Care Provider: Elsie Stain Other Clinician: Referring Provider: Elsie Stain Treating Provider/Extender: Ricard Dillon Weeks in Treatment: 0 Information Obtained from: Patient Chief Complaint 02/09/17; Patient is here for review of a traumatic wound on the left leg anteriory Electronic Signature(s) Signed: 02/09/2017 3:44:58 PM By: Linton Ham MD Entered By: Linton Ham on 02/09/2017 09:24:24 Benjamin Soto ([id number]) -------------------------------------------------------------------------------- Debridement Details Patient Name: Benjamin Soto Date of Service: 02/09/2017 8:00 AM Medical Record Number: [id number] Patient Account Number: [id number] Date of Birth/Sex: 1936/02/07 (81 y.o. Male) Treating RN: Montey Hora Primary Care Provider: Elsie Stain Other Clinician: Referring Provider: Elsie Stain Treating Provider/Extender: Tito Dine in Treatment: 0 Debridement Performed for Wound #1 Left,Midline Lower Leg Assessment: Performed By: Physician Ricard Dillon, MD Debridement: Debridement Pre-procedure Verification/Time Out Yes - 08:50 Taken: Start Time: 08:50 Pain Control: Lidocaine 4% Topical Solution Level: Skin/Subcutaneous Tissue Total Area Debrided (L x 3.2 (cm) x 2 (cm) = 6.4 (cm) W): Tissue and other Viable, Non-Viable, Eschar, Fibrin/Slough, Skin, Subcutaneous material debrided: Instrument: Blade, Curette, Forceps Specimen: Swab Number of Specimens 1 Taken: Bleeding: Minimum Hemostasis Achieved: Pressure End Time: 08:55 Procedural Pain: 0 Post Procedural Pain: 0 Response to Treatment: Procedure was tolerated  well Post Debridement Measurements of Total Wound Length: (cm) 3.2 Width: (cm) 2 Depth: (cm) 0.4 Volume: (cm) 2.011 Character of Wound/Ulcer Post Improved Debridement: Post Procedure Diagnosis Same as Pre-procedure Electronic Signature(s) Signed: 02/09/2017 3:34:25 PM By: Montey Hora Signed: 02/09/2017 3:44:58 PM By: Linton Ham MD [place], Rosalee Kaufman ([id number]) Entered By: Linton Ham on 02/09/2017 09:23:49 Benjamin Soto ([id number]) -------------------------------------------------------------------------------- HPI Details Patient Name: Benjamin Soto Date of Service: 02/09/2017 8:00 AM Medical Record Number: [id number] Patient Account Number: [id number] Date of Birth/Sex: 1936-08-20 (81 y.o. Male) Treating RN: Montey Hora Primary Care Provider: Elsie Stain Other Clinician: Referring Provider: Elsie Stain Treating Provider/Extender: Ricard Dillon Weeks in Treatment: 0 History of Present Illness HPI Description: 02/09/17; 81 year old man with a hx of type 2 DM, smoker. He suffered a fall 17 days ago while working on the [employer] of his trailer. He didn't realized anything had happed until he returned home. Has been applying polysporin. Received Cephalexin finishes on Saturday. No prior wound history. ABI's 0.96 on the left. No known hx of PAD. Hx of Afib on Coumadin,chf,st 2 crf Electronic Signature(s) Signed: 02/09/2017 3:44:58 PM By: Linton Ham MD Entered By: Linton Ham on 02/09/2017 09:30:19 Benjamin Soto ([id number]) -------------------------------------------------------------------------------- Physical Exam Details Patient Name: Benjamin Soto Date of Service: 02/09/2017 8:00 AM Medical Record Number: [id number] Patient Account Number: [id number] Date of Birth/Sex: 1936-02-24 (81 y.o. Male) Treating RN: Montey Hora Primary Care Provider: Elsie Stain Other Clinician: Referring Provider: Elsie Stain Treating Provider/Extender:  Ricard Dillon Weeks in Treatment: 0 Constitutional Sitting or standing Blood Pressure is within target range for patient.Marland Kitchen Respirations regular, non-labored and within target range.. Temperature is normal and within the target range for the patient.. Patient's appearance is neat and clean. Appears in no acute distress. Well nourished and well developed.Marland Kitchen Respiratory Respiratory effort is easy and symmetric bilaterally. Rate is normal at rest and on room air.. Bilateral breath sounds are clear and equal in all lobes with no wheezes, rales or rhonchi.. Cardiovascular Heart rhythm and rate regular, without murmur or gallop.. Femoral arteries  without bruits and pulses strong.. Pedal pulses palpable and strong bilaterally.. No edema. Gastrointestinal (GI) Abdomen is soft and non-distended without masses or tenderness. Bowel sounds active in all quadrants.. Lymphatic none palpabler in the popliteal or inguinal area. Neurological some losss of vibraton and light touch suggestive of diabetic neuropathy. Psychiatric No evidence of depression, anxiety, or agitation. Calm, cooperative, and communicative. Appropriate interactions and affect.. Notes Wound exam; anterior left leg. black necrotic surface. Surrounding discoloration suggestive of resolving hematoma. No obvious cellulitis or tenderness Electronic Signature(s) Signed: 02/09/2017 3:44:58 PM By: Linton Ham MD Entered By: Linton Ham on 02/09/2017 09:33:07 Benjamin Soto ([id number]) -------------------------------------------------------------------------------- Physician Orders Details Patient Name: Benjamin Soto Date of Service: 02/09/2017 8:00 AM Medical Record Number: [id number] Patient Account Number: [id number] Date of Birth/Sex: 04-Dec-1935 (81 y.o. Male) Treating RN: Montey Hora Primary Care Provider: Elsie Stain Other Clinician: Referring Provider: Elsie Stain Treating Provider/Extender: Tito Dine in Treatment: 0 Verbal / Phone Orders: No Diagnosis Coding Wound Cleansing Wound #1 Left,Midline Lower Leg o Clean wound with Normal Saline. o May Shower, gently pat wound dry prior to applying new dressing. Anesthetic Wound #1 Left,Midline Lower Leg o Topical Lidocaine 4% cream applied to wound bed prior to debridement Primary Wound Dressing Wound #1 Left,Midline Lower Leg o Aquacel Ag Secondary Dressing Wound #1 Left,Midline Lower Leg o Dry Gauze o Boardered Foam Dressing - or telfa island dressing Dressing Change Frequency Wound #1 Left,Midline Lower Leg o Change dressing every other day. Follow-up Appointments Wound #1 Left,Midline Lower Leg o Return Appointment in 1 week. Edema Control Wound #1 Left,Midline Lower Leg o Elevate legs to the level of the heart and pump ankles as often as possible Additional Orders / Instructions Wound #1 Left,Midline Lower Leg o Increase protein intake. o Other: - Please add vitamin A, vitamin C and zinc supplements to your diet TOBBY, FAWCETT ([id number]) Laboratory o Bacteria identified in Wound by Culture (MICRO) oooo LOINC Code: 8921-1 oooo Convenience Name: Wound culture routine Electronic Signature(s) Signed: 02/09/2017 3:34:25 PM By: Montey Hora Signed: 02/09/2017 3:44:58 PM By: Linton Ham MD Entered By: Montey Hora on 02/09/2017 08:58:54 Benjamin Soto ([id number]) -------------------------------------------------------------------------------- Problem List Details Patient Name: Benjamin Soto Date of Service: 02/09/2017 8:00 AM Medical Record Number: [id number] Patient Account Number: [id number] Date of Birth/Sex: January 16, 1936 (81 y.o. 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 Male) Treating RN: Montey Hora Primary Care Provider: Elsie Stain Other Clinician: Referring Provider: Elsie Stain Treating Provider/Extender: Ricard Dillon Weeks in Treatment: 0 Subjective Chief Complaint Information obtained from Patient 02/09/17; Patient is here for review of a traumatic wound on the left leg anteriory History of Present Illness (HPI) 02/09/17; 81 year old man with a hx of type 2 DM, smoker. He suffered a fall 17 days ago while working on the [employer] of his trailer. He didn't realized anything had happed until he returned home. Has been  applying polysporin. Received Cephalexin finishes on Saturday. No prior wound history. ABI's 0.96 on the left. No known hx of PAD. Hx of Afib on Coumadin,chf,st 2 crf Wound History Patient presents with 1 open wound that has been present for approximately 17 days. Patient has been treating wound in the following manner: neosporin. Laboratory tests have not been performed in the last month. Patient reportedly has not tested positive for an antibiotic resistant organism. Patient reportedly has not tested positive for osteomyelitis. Patient reportedly has not had testing performed to evaluate circulation in the legs. Patient History Information obtained from  Patient. Allergies No Known Allergies Social History Current some day smoker, Marital Status - Married, Alcohol Use - Daily, Drug Use - No History, Caffeine Use - Daily. Medical History Eyes Patient has history of Glaucoma Respiratory Patient has history of Chronic Obstructive Pulmonary Disease (COPD) Cardiovascular Patient has history of Arrhythmia - a fib, SVT, Coronary Artery Disease, Hypertension Endocrine Patient has history of Type II Diabetes ALON, MAZOR ([id number]) Musculoskeletal Patient has history of Osteoarthritis Neurologic Patient has history of Neuropathy Oncologic Denies history of Received Chemotherapy, Received Radiation Patient is treated with Oral Agents. Blood sugar is not tested. Review of Systems (ROS) Constitutional Symptoms (General Health) The patient has no complaints or symptoms. Eyes The patient has no complaints or symptoms. Ear/Nose/Mouth/Throat The patient has no complaints or symptoms. Hematologic/Lymphatic The patient has no complaints or symptoms. Respiratory The patient has no complaints or symptoms. Gastrointestinal The patient has no complaints or symptoms. Endocrine The patient has no complaints or symptoms. Genitourinary The patient has no complaints or symptoms. Immunological The patient has no complaints or symptoms. Integumentary (Skin) The patient has no complaints or symptoms. Neurologic The patient has no complaints or symptoms. Oncologic The patient has no complaints or symptoms. Psychiatric The patient has no complaints or symptoms. Objective Constitutional Sitting or standing Blood Pressure is within target range for patient.Marland Kitchen Respirations regular, non-labored and within target range.. Temperature is normal and within the target range for the patient.. Patient's appearance is neat and clean. Appears in no acute distress. Well nourished and well developed.. Vitals Time Taken: 8:14 AM, Height: 69 in, Weight: 185  lbs, BMI: 27.3, Pulse: 62 bpm, Respiratory Rate: 16 Hillebrand, Edoardo ([id number]) breaths/min, Blood Pressure: 103/53 mmHg. Respiratory Respiratory effort is easy and symmetric bilaterally. Rate is normal at rest and on room air.. Bilateral breath sounds are clear and equal in all lobes with no wheezes, rales or rhonchi.. Cardiovascular Heart rhythm and rate regular, without murmur or gallop.. Femoral arteries without bruits and pulses strong.. Pedal pulses palpable and strong bilaterally.. No edema. Gastrointestinal (GI) Abdomen is soft and non-distended without masses or tenderness. Bowel sounds active in all quadrants.. Lymphatic none palpabler in the popliteal or inguinal area. Neurological some losss of vibraton and light touch suggestive of diabetic neuropathy. Psychiatric No evidence of depression, anxiety, or agitation. Calm, cooperative, and communicative. Appropriate interactions and affect.. General Notes: Wound exam; anterior left leg. black necrotic surface. Surrounding discoloration suggestive of resolving hematoma. No obvious cellulitis or tenderness Integumentary (Hair, Skin) Wound #1 status is Open. Original cause of wound was Trauma. The wound is located on the Left,Midline Lower Leg. The wound measures 3.2cm length x 2cm width x 0.1cm depth; 5.027cm^2 area and 0.503cm^3 volume. There is Fat Layer (Subcutaneous Tissue) Exposed exposed. There is no tunneling or undermining noted. There is a medium amount of serosanguineous drainage noted. The wound margin is flat and intact. There is no granulation within the  wound bed. There is a large (67-100%) amount of necrotic tissue within the wound bed including Eschar. The periwound skin appearance exhibited: Ecchymosis. 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 There was a Skin/Subcutaneous Tissue Debridement ([id number]) debridement with total area of 6.4 sq cm performed by Ricard Dillon, MD. with the following instrument(s): Blade, Curette, and Forceps to remove Viable and Non-Viable tissue/material including Fibrin/Slough, Eschar, Skin, and Subcutaneous after achieving pain control using Lidocaine 4% Topical Solution. 1 Specimen was taken by a Swab and sent to the lab per facility protocol.A time out was conducted at 08:50, prior to the start of the procedure. A Minimum amount of bleeding was controlled with Pressure. The procedure was tolerated well with a pain level of 0 throughout and a pain level of 0 following the procedure. Post Debridement Measurements: 3.2cm length x 2cm width x 0.4cm depth; 2.011cm^3 volume. Character of Wound/Ulcer Post Debridement is improved. Post procedure Diagnosis Wound #1: Same as Pre-Procedure #15 scalpel picubs to remove nectrotic surface epithelium. #5 curette to removed nectoic subcutaaneous tissue. minor bleeding controlled with direct pressure Plan Wound Cleansing: Wound #1 Left,Midline Lower Leg: Clean wound with Normal Saline. May Shower, gently pat wound dry prior to applying new dressing. Anesthetic: Wound #1 Left,Midline Lower Leg: Topical Lidocaine 4% cream applied to wound bed prior to debridement Primary Wound Dressing: Wound #1 Left,Midline Lower Leg: Aquacel Ag Secondary Dressing: Wound #1 Left,Midline Lower Leg: Dry Gauze Boardered Foam Dressing - or telfa island dressing Dressing Change Frequency: Wound #1 Left,Midline Lower Leg: Change dressing every other day. Follow-up Appointments: Wound #1 Left,Midline Lower Leg: TREMANE, SPURGEON ([id number]) Return Appointment in 1 week. Edema Control: Wound #1 Left,Midline Lower Leg: Elevate legs to the level of  the heart and pump ankles as often as possible Additional Orders / Instructions: Wound #1 Left,Midline Lower Leg: Increase protein intake. Other: - Please add vitamin A, vitamin C and zinc supplements to your diet Laboratory ordered were: Wound culture routine 1 Aquacel AG foam change q2d 2 C+S done from medial diviot. No change to antibiotics. I doubt infection 3 may need skin sub 4 convinced he had an underlying hematoma at one point Electronic Signature(s) Signed: 02/09/2017 3:44:58 PM By: Linton Ham MD Entered By: Linton Ham on 02/09/2017 09:40:59 Benjamin Soto ([id number]) -------------------------------------------------------------------------------- ROS/PFSH Details Patient Name: Benjamin Soto Date of Service: 02/09/2017 8:00 AM Medical Record Number: [id number] Patient Account Number: [id number] Date of Birth/Sex: January 01, 1936 (81 y.o. Male) Treating RN: Cornell Barman Primary Care Provider: Elsie Stain Other Clinician: Referring Provider: Elsie Stain Treating Provider/Extender: Ricard Dillon Weeks in Treatment: 0 Information Obtained From Patient Wound History Do you currently have one or more open woundso Yes How many open wounds do you currently haveo 1 Approximately how long have you had your woundso 17 days How have you been treating your wound(s) until nowo neosporin Has your wound(s) ever healed and then re-openedo No Have you had any lab work done in the past montho No Have you tested positive for an antibiotic resistant organism (MRSA, VRE)o No Have you tested positive for osteomyelitis (bone infection)o No Have you had any tests for circulation on your legso No Constitutional Symptoms (General Health) Complaints and Symptoms: No Complaints or Symptoms Eyes Complaints and Symptoms:  No Complaints or Symptoms Medical History: Positive for: Glaucoma Ear/Nose/Mouth/Throat Complaints and Symptoms: No Complaints or  Symptoms Hematologic/Lymphatic Complaints and Symptoms: No Complaints or Symptoms Respiratory Complaints and Symptoms: No Complaints or Symptoms Medical HistoryMICHAE, GRIMLEY ([id number]) Positive for: Chronic Obstructive Pulmonary Disease (COPD) Cardiovascular Medical History: Positive for: Arrhythmia - a fib, SVT; Coronary Artery Disease; Hypertension Gastrointestinal Complaints and Symptoms: No Complaints or Symptoms Endocrine Complaints and Symptoms: No Complaints or Symptoms Medical History: Positive for: Type II Diabetes Time with diabetes: about 20 years Treated with: Oral agents Blood sugar tested every day: No Genitourinary Complaints and Symptoms: No Complaints or Symptoms Immunological Complaints and Symptoms: No Complaints or Symptoms Integumentary (Skin) Complaints and Symptoms: No Complaints or Symptoms Musculoskeletal Medical History: Positive for: Osteoarthritis Neurologic Complaints and Symptoms: No Complaints or Symptoms Medical History: Positive for: Neuropathy RAINEN, VANROSSUM ([id number]) Oncologic Complaints and Symptoms: No Complaints or Symptoms Medical History: Negative for: Received Chemotherapy; Received Radiation Psychiatric Complaints and Symptoms: No Complaints or Symptoms HBO Extended History Items Eyes: Glaucoma Immunizations Pneumococcal Vaccine: Received Pneumococcal Vaccination: Yes Immunization Notes: up to date Family and Social History Current some day smoker; Marital Status - Married; Alcohol Use: Daily; Drug Use: No History; Caffeine Use: Daily; Financial Concerns: No; Food, Clothing or Shelter Needs: No; Support System Lacking: No; Transportation Concerns: No; Advanced Directives: No; Patient does not want information on Administrator) Signed: 02/09/2017 8:31:20 AM By: Gretta Cool, BSN, RN, CWS, Kim RN, BSN Signed: 02/09/2017 3:44:58 PM By: Linton Ham MD Entered By: Gretta Cool, BSN, RN,  CWS, Kim on 02/09/2017 08:29:16 JESIAH, GRISMER ([id number]) -------------------------------------------------------------------------------- [place] Details Patient Name: Benjamin Soto Date of Service: 02/09/2017 Medical Record Number: [id number] Patient Account Number: [id number] Date of Birth/Sex: 12-09-1935 (81 y.o. 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 Facility Procedures CPT4 Code Description: [id number] [facility] VISIT-LEV 3 EST PT Modifier: Quantity: 1 CPT4 Code Description: [id number] 11042 - DEB SUBQ TISSUE 20 SQ CM/< ICD-10 Description Diagnosis S81.812D Laceration without foreign body, left lower leg, su L97.223 Non-pressure chronic ulcer of left calf with necros Modifier: bsequent enc is of muscle Quantity: 1 ounter Physician Procedures CPT4 Code Description: [id number] WC PHYS LEVEL 3 o NEW PT ICD-10 Description Diagnosis S81.812D Laceration without foreign body, left lower leg, su L97.223 Non-pressure chronic ulcer of left calf with necros Modifier: 25 bsequent enco is of muscle Quantity: 1 unter CPT4 Code Description: [id number] 11042 - WC PHYS SUBQ TISS 20 SQ CM ICD-10 Description Diagnosis S81.812D Laceration without foreign body, left lower leg, su L97.223 Non-pressure chronic ulcer of left calf with necros Modifier: bsequent enco is of muscle Quantity: 1 Personal assistant) Signed: 02/09/2017 3:44:58 PM By: Linton Ham MD Entered By: Linton Ham on 02/09/2017 09:42:28

## 2017-02-10 NOTE — Progress Notes (Signed)
EDEL, RIVERO ([id number]) Visit Report for 02/09/2017 Allergy List Details Patient Name: Benjamin Soto, Benjamin Soto Date of Service: 02/09/2017 8:00 AM Medical Record Number: [id number] Patient Account Number: [id number] Date of Birth/Sex: 04-02-1936 (81 y.o. Male) Treating RN: Cornell Barman Primary Care Faust Thorington: Elsie Stain Other Clinician: Referring Adaora Mchaney: Elsie Stain Treating Costantino Kohlbeck/Extender: Ricard Dillon Weeks in Treatment: 0 Allergies Active Allergies No Known Allergies Allergy Notes Electronic Signature(s) Signed: 02/09/2017 8:31:20 AM By: Gretta Cool, BSN, RN, CWS, Kim RN, BSN Entered By: Gretta Cool, BSN, RN, CWS, Kim on 02/09/2017 08:23:11 Benjamin Soto ([id number]) -------------------------------------------------------------------------------- Arrival Information Details Patient Name: Benjamin Soto Date of Service: 02/09/2017 8:00 AM Medical Record Number: [id number] Patient Account Number: [id number] Date of Birth/Sex: [birth year] (80 y.o. Male) Treating RN: Cornell Barman Primary Care Aminata Buffalo: Elsie Stain Other Clinician: Referring Jessilyn Catino: Elsie Stain Treating Benjamin Soto/Extender: Tito Dine in Treatment: 0 Visit Information Patient Arrived: Benjamin Soto Arrival Time: 08:12 Accompanied By: wife Transfer Assistance: None Patient Identification Verified: Yes Secondary Verification Process Yes Completed: Patient Has Alerts: Yes Patient Alerts: Patient on Blood Thinner warfarin Type II Diabetic Electronic Signature(s) Signed: 02/09/2017 8:31:20 AM By: Gretta Cool, BSN, RN, CWS, Kim RN, BSN Entered By: Gretta Cool, BSN, RN, CWS, Kim on 02/09/2017 08:13:57 Benjamin Soto ([id number]) -------------------------------------------------------------------------------- Clinic Level of Care Assessment Details Patient Name: Benjamin Soto Date of Service: 02/09/2017 8:00 AM Medical Record Number: [id number] Patient Account Number: [id number] Date of Birth/Sex: May 29, 1936 (81 y.o.  Male) Treating RN: Montey Hora Primary Care Ali Mohl: Elsie Stain Other Clinician: Referring Verdun Rackley: Elsie Stain Treating Benjamin Soto/Extender: Tito Dine in Treatment: 0 Clinic Level of Care Assessment Items TOOL 1 Quantity Score []  - Use when EandM and Procedure is performed on INITIAL visit 0 ASSESSMENTS - Nursing Assessment / Reassessment X - General Physical Exam (combine w/ comprehensive assessment (listed just 1 20 below) when performed on new pt. evals) X - Comprehensive Assessment (HX, ROS, Risk Assessments, Wounds Hx, etc.) 1 25 ASSESSMENTS - Wound and Skin Assessment / Reassessment []  - Dermatologic / Skin Assessment (not related to wound area) 0 ASSESSMENTS - Ostomy and/or Continence Assessment and Care []  - Incontinence Assessment and Management 0 []  - Ostomy Care Assessment and Management (repouching, etc.) 0 PROCESS - Coordination of Care X - Simple Patient / Family Education for ongoing care 1 15 []  - Complex (extensive) Patient / Family Education for ongoing care 0 X - Staff obtains Programmer, systems, Records, Test Results / Process Orders 1 10 []  - Staff telephones HHA, Nursing Homes / Clarify orders / etc 0 []  - Routine Transfer to another Facility (non-emergent condition) 0 []  - Routine Hospital Admission (non-emergent condition) 0 X - New Admissions / Biomedical engineer / Ordering NPWT, Apligraf, etc. 1 15 []  - Emergency Hospital Admission (emergent condition) 0 PROCESS - Special Needs []  - Pediatric / Minor Patient Management 0 []  - Isolation Patient Management 0 OSLO, HUNTSMAN ([id number]) []  - Hearing / Language / Visual special needs 0 []  - Assessment of Community assistance (transportation, D/C planning, etc.) 0 []  - Additional assistance / Altered mentation 0 []  - Support Surface(s) Assessment (bed, cushion, seat, etc.) 0 INTERVENTIONS - Miscellaneous []  - External ear exam 0 []  - Patient Transfer (multiple staff / Civil Service fast streamer /  Similar devices) 0 []  - Simple Staple / Suture removal (25 or less) 0 []  - Complex Staple / Suture removal (26 or more) 0 []  - Hypo/Hyperglycemic Management (do not check if billed separately) 0 X - Ankle / Brachial Index (ABI) - do not check if billed separately  1 15 Has the patient been seen at the hospital within the last three years: Yes Total Score: 100 Level Of Care: New/Established - Level 3 Electronic Signature(s) Signed: 02/09/2017 3:34:25 PM By: Montey Hora Entered By: Montey Hora on 02/09/2017 08:59:19 Benjamin Soto ([id number]) -------------------------------------------------------------------------------- Encounter Discharge Information Details Patient Name: Benjamin Soto Date of Service: 02/09/2017 8:00 AM Medical Record Number: [id number] Patient Account Number: [id number] Date of Birth/Sex: [birth year] (81 y.o. Male) Treating RN: Montey Hora Primary Care Blakeley Scheier: Elsie Stain Other Clinician: Referring Jola Critzer: Elsie Stain Treating Kalieb Freeland/Extender: Tito Dine in Treatment: 0 Encounter Discharge Information Items Discharge Pain Level: 0 Discharge Condition: Stable Ambulatory Status: Cane Discharge Destination: Home Transportation: Private Auto Accompanied By: spouse Schedule Follow-up Appointment: Yes Medication Reconciliation completed No and provided to Patient/Care Kathye Cipriani: Provided on Clinical Summary of Care: 02/09/2017 Form Type Recipient Paper Patient HD Electronic Signature(s) Signed: 02/09/2017 9:17:22 AM By: Ruthine Dose Entered By: Ruthine Dose on 02/09/2017 09:17:22 Benjamin Soto ([id number]) -------------------------------------------------------------------------------- Lower Extremity Assessment Details Patient Name: Benjamin Soto Date of Service: 02/09/2017 8:00 AM Medical Record Number: [id number] Patient Account Number: [id number] Date of Birth/Sex: Jul 15, 1936 (80 y.o. Male) Treating RN: Cornell Barman Primary Care Fleet Higham: Elsie Stain Other Clinician: Referring Eliot Bencivenga: Elsie Stain Treating Renn Dirocco/Extender: Ricard Dillon Weeks in Treatment: 0 Edema Assessment Assessed: [Left: No] [Right: No] Edema: [Left: N] [Right: o] Calf Left: Right: Point of Measurement: 36 cm From Medial Instep 36.5 cm cm Ankle Left: Right: Point of Measurement: 10 cm From Medial Instep 23.7 cm cm Vascular Assessment Pulses: Dorsalis Pedis Palpable: [Left:Yes] Doppler Audible: [Left:Yes] Posterior Tibial Palpable: [Left:Yes] Doppler Audible: [Left:Yes] Extremity colors, hair growth, and conditions: Extremity Color: [Left:Normal] Hair Growth on Extremity: [Left:No] Temperature of Extremity: [Left:Warm] Capillary Refill: [Left:< 3 seconds] Blood Pressure: Brachial: [Left:108] Dorsalis Pedis: 104 [Left:Dorsalis Pedis:] Ankle: Posterior Tibial: 80 [Left:Posterior Tibial: 0.96] Toe Nail Assessment Left: Right: Thick: No Discolored: No Deformed: No Improper Length and Hygiene: No SHALIN, VONBARGEN ([id number]) Electronic Signature(s) Signed: 02/09/2017 3:34:22 PM By: Gretta Cool, BSN, RN, CWS, Kim RN, BSN Entered By: Gretta Cool, BSN, RN, CWS, Kim on 02/09/2017 08:42:08 Benjamin Soto ([id number]) -------------------------------------------------------------------------------- Multi Wound Chart Details Patient Name: Benjamin Soto Date of Service: 02/09/2017 8:00 AM Medical Record Number: [id number] Patient Account Number: [id number] Date of Birth/Sex: [birth year] (80 y.o. Male) Treating RN: Montey Hora Primary Care Jamee Pacholski: Elsie Stain Other Clinician: Referring Rhena Glace: Elsie Stain Treating Charlene Cowdrey/Extender: Ricard Dillon Weeks in Treatment: 0 Vital Signs Height(in): 69 Pulse(bpm): 62 Weight(lbs): 185 Blood Pressure 103/53 (mmHg): Body Mass Index(BMI): 27 Temperature(F): Respiratory Rate 16 (breaths/min): Photos: [N/A:N/A] Wound Location: Left Lower Leg -  Midline N/A N/A Wounding Event: Trauma N/A N/A Primary Etiology: Trauma, Other N/A N/A Date Acquired: 01/23/2017 N/A N/A Weeks of Treatment: 0 N/A N/A Wound Status: Open N/A N/A Measurements L x W x D 3.2x2x0.1 N/A N/A (cm) Area (cm) : 5.027 N/A N/A Volume (cm) : 0.503 N/A N/A % Reduction in Area: 0.00% N/A N/A % Reduction in Volume: 0.00% N/A N/A Classification: Full Thickness Without N/A N/A Exposed Support Structures Exudate Amount: Medium N/A N/A Exudate Type: Serosanguineous N/A N/A Exudate Color: red, brown N/A N/A Wound Margin: Flat and Intact N/A N/A Granulation Amount: None Present (0%) N/A N/A Necrotic Amount: Large (67-100%) N/A N/A Necrotic Tissue: Eschar N/A N/A Exposed Structures: Fat Layer (Subcutaneous N/A N/A Tissue) Exposed: Yes Coston, Brenen ([id number]) Fascia: No Tendon: No Muscle: No Joint: No Bone: No Epithelialization: None N/A N/A Periwound Skin Texture: No Abnormalities Noted N/A N/A Periwound  Skin No Abnormalities Noted N/A N/A Moisture: Periwound Skin Color: Ecchymosis: Yes N/A N/A Tenderness on No N/A N/A Palpation: Wound Preparation: Ulcer Cleansing: N/A N/A Rinsed/Irrigated with Saline Topical Anesthetic Applied: Other: lidociane 4% Treatment Notes Electronic Signature(s) Signed: 02/09/2017 3:34:25 PM By: Montey Hora Entered By: Montey Hora on 02/09/2017 08:53:21 Benjamin Soto ([id number]) -------------------------------------------------------------------------------- Multi-Disciplinary Care Plan Details Patient Name: Benjamin Soto Date of Service: 02/09/2017 8:00 AM Medical Record Number: [id number] Patient Account Number: [id number] Date of Birth/Sex: [birth year] (82 y.o. Male) Treating RN: Montey Hora Primary Care Tayjon Halladay: Elsie Stain Other Clinician: Referring Hava Massingale: Elsie Stain Treating Temprance Wyre/Extender: Tito Dine in Treatment: 0 Active Inactive ` Abuse / Safety / Falls / Self Care  Management Nursing Diagnoses: Potential for falls Goals: Patient will not experience any injury related to falls Date Initiated: 02/09/2017 Target Resolution Date: 04/23/2017 Goal Status: Active Interventions: Assess fall risk on admission and as needed Notes: ` Orientation to the Wound Care Program Nursing Diagnoses: Knowledge deficit related to the wound healing center program Goals: Patient/caregiver will verbalize understanding of the [facility] Program Date Initiated: 02/09/2017 Target Resolution Date: 04/23/2017 Goal Status: Active Interventions: Provide education on orientation to the wound center Notes: ` Wound/Skin Impairment Nursing Diagnoses: Impaired tissue integrity JC, VERON ([id number]) Goals: Patient/caregiver will verbalize understanding of skin care regimen Date Initiated: 02/09/2017 Target Resolution Date: 04/23/2017 Goal Status: Active Ulcer/skin breakdown will have a volume reduction of 30% by week 4 Date Initiated: 02/09/2017 Target Resolution Date: 04/23/2017 Goal Status: Active Ulcer/skin breakdown will have a volume reduction of 50% by week 8 Date Initiated: 02/09/2017 Target Resolution Date: 04/23/2017 Goal Status: Active Ulcer/skin breakdown will have a volume reduction of 80% by week 12 Date Initiated: 02/09/2017 Target Resolution Date: 04/23/2017 Goal Status: Active Ulcer/skin breakdown will heal within 14 weeks Date Initiated: 02/09/2017 Target Resolution Date: 04/23/2017 Goal Status: Active Interventions: Assess patient/caregiver ability to obtain necessary supplies Assess patient/caregiver ability to perform ulcer/skin care regimen upon admission and as needed Assess ulceration(s) every visit Notes: Electronic Signature(s) Signed: 02/09/2017 3:34:25 PM By: Montey Hora Entered By: Montey Hora on 02/09/2017 08:52:37 Benjamin Soto ([id number]) -------------------------------------------------------------------------------- Pain  Assessment Details Patient Name: Benjamin Soto Date of Service: 02/09/2017 8:00 AM Medical Record Number: [id number] Patient Account Number: [id number] Date of Birth/Sex: 1935-12-04 (81 y.o. Male) Treating RN: Cornell Barman Primary Care Selah Zelman: Elsie Stain Other Clinician: Referring Tan Clopper: Elsie Stain Treating Steve Youngberg/Extender: Ricard Dillon Weeks in Treatment: 0 Active Problems Location of Pain Severity and Description of Pain Patient Has Paino No Site Locations With Dressing Change: Yes Pain Management and Medication Current Pain Management: Goals for Pain Management Topical or injectable lidocaine is offered to patient for acute pain when surgical debridement is performed. If needed, Patient is instructed to use over the counter pain medication for the following 24-48 hours after debridement. Wound care MDs do not prescribed pain medications. Patient has chronic pain or uncontrolled pain. Patient has been instructed to make an appointment with their Primary Care Physician for pain management. Electronic Signature(s) Signed: 02/09/2017 8:31:20 AM By: Gretta Cool, BSN, RN, CWS, Kim RN, BSN Entered By: Gretta Cool, BSN, RN, CWS, Kim on 02/09/2017 08:14:06 Benjamin Soto ([id number]) -------------------------------------------------------------------------------- Patient/Caregiver Education Details Patient Name: Benjamin Soto Date of Service: 02/09/2017 8:00 AM Medical Record Number: [id number] Patient Account Number: [id number] Date of Birth/Gender: 11-15-1935 (81 y.o. Male) Treating RN: Montey Hora Primary Care Physician: Elsie Stain Other Clinician: Referring Physician: Elsie Stain Treating Physician/Extender: Tito Dine in Treatment: 0 Education Assessment Education Provided  To: Patient and Caregiver Education Topics Provided Wound/Skin Impairment: Handouts: Other: wound care as ordered Methods: Demonstration, Explain/Verbal Responses: State  content correctly Electronic Signature(s) Signed: 02/09/2017 3:34:25 PM By: Montey Hora Entered By: Montey Hora on 02/09/2017 09:02:12 Benjamin Soto ([id number]) -------------------------------------------------------------------------------- Wound Assessment Details Patient Name: Benjamin Soto Date of Service: 02/09/2017 8:00 AM Medical Record Number: [id number] Patient Account Number: [id number] Date of Birth/Sex: May 08, 1936 (81 y.o. Male) Treating RN: Cornell Barman Primary Care Kaylan Yates: Elsie Stain Other Clinician: Referring Takeysha Bonk: Elsie Stain Treating Bernette Seeman/Extender: Ricard Dillon Weeks in Treatment: 0 Wound Status Wound Number: 1 Primary Etiology: Trauma, Other Wound Location: Left Lower Leg - Midline Wound Status: Open Wounding Event: Trauma Date Acquired: 01/23/2017 Weeks Of Treatment: 0 Clustered Wound: No Photos Wound Measurements Length: (cm) 3.2 Width: (cm) 2 Depth: (cm) 0.1 Area: (cm) 5.027 Volume: (cm) 0.503 % Reduction in Area: 0% % Reduction in Volume: 0% Epithelialization: None Tunneling: No Undermining: No Wound Description Full Thickness Without Exposed Classification: Support Structures Wound Margin: Flat and Intact Exudate Medium Amount: Exudate Type: Serosanguineous Exudate Color: red, brown Foul Odor After Cleansing: No Slough/Fibrino Yes Wound Bed Granulation Amount: None Present (0%) Exposed Structure Necrotic Amount: Large (67-100%) Fascia Exposed: No Necrotic Quality: Eschar Fat Layer (Subcutaneous Tissue) Exposed: Yes Tendon Exposed: No Muscle Exposed: No Joint Exposed: No Jantz, Sneijder ([id number]) Bone Exposed: No Periwound Skin Texture Texture Color No Abnormalities Noted: No No Abnormalities Noted: No Ecchymosis: Yes Moisture No Abnormalities Noted: No Wound Preparation Ulcer Cleansing: Rinsed/Irrigated with Saline Topical Anesthetic Applied: Other: lidociane 4%, Treatment Notes Wound #1  (Left, Midline Lower Leg) 1. Cleansed with: Clean wound with Normal Saline 2. Anesthetic Topical Lidocaine 4% cream to wound bed prior to debridement 4. Dressing Applied: Aquacel Ag 5. Secondary Dressing Applied Bordered Foam Dressing Dry Gauze Electronic Signature(s) Signed: 02/09/2017 8:31:20 AM By: Gretta Cool, BSN, RN, CWS, Kim RN, BSN Entered By: Gretta Cool, BSN, RN, CWS, Kim on 02/09/2017 08:23:03 Benjamin Soto ([id number]) -------------------------------------------------------------------------------- [place] Details Patient Name: Benjamin Soto Date of Service: 02/09/2017 8:00 AM Medical Record Number: [id number] Patient Account Number: [id number] Date of Birth/Sex: 1935-12-30 (80 y.o. Male) Treating RN: Cornell Barman Primary Care Judieth Mckown: Elsie Stain Other Clinician: Referring Khalessi Blough: Elsie Stain Treating Tayten Bergdoll/Extender: Ricard Dillon Weeks in Treatment: 0 Vital Signs Time Taken: 08:14 Pulse (bpm): 62 Height (in): 69 Respiratory Rate (breaths/min): 16 Weight (lbs): 185 Blood Pressure (mmHg): 103/53 Body Mass Index (BMI): 27.3 Reference Range: 80 - 120 mg / dl Electronic Signature(s) Signed: 02/09/2017 8:31:20 AM By: Gretta Cool, BSN, RN, CWS, Kim RN, BSN Entered By: Gretta Cool, BSN, RN, CWS, Kim on 02/09/2017 08:14:43

## 2017-02-10 NOTE — Progress Notes (Signed)
SHA, AMER ([id number]) Visit Report for 02/09/2017 Abuse/Suicide Risk Screen Details Patient Name: Benjamin Soto, Benjamin Soto Date of Service: 02/09/2017 8:00 AM Medical Record Number: [id number] Patient Account Number: [id number] Date of Birth/Sex: 07-12-1936 (81 y.o. Male) Treating RN: Cornell Barman Primary Care Shya Kovatch: Elsie Stain Other Clinician: Referring Torie Towle: Elsie Stain Treating Gaspard Isbell/Extender: Ricard Dillon Weeks in Treatment: 0 Abuse/Suicide Risk Screen Items Answer ABUSE/SUICIDE RISK SCREEN: Has anyone close to you tried to hurt or harm you recentlyo No Do you feel uncomfortable with anyone in your familyo No Has anyone forced you do things that you didnot want to doo No Do you have any thoughts of harming yourselfo No Patient displays signs or symptoms of abuse and/or neglect. No Electronic Signature(s) Signed: 02/09/2017 8:31:20 AM By: Gretta Cool, BSN, RN, CWS, Kim RN, BSN Entered By: Gretta Cool, BSN, RN, CWS, Kim on 02/09/2017 08:29:26 Benjamin Soto ([id number]) -------------------------------------------------------------------------------- Activities of Daily Living Details Patient Name: Benjamin Soto Date of Service: 02/09/2017 8:00 AM Medical Record Number: [id number] Patient Account Number: [id number] Date of Birth/Sex: 07-08-1936 (81 y.o. Male) Treating RN: Cornell Barman Primary Care Tequila Rottmann: Elsie Stain Other Clinician: Referring Kemani Demarais: Elsie Stain Treating Joann Kulpa/Extender: Ricard Dillon Weeks in Treatment: 0 Activities of Daily Living Items Answer Activities of Daily Living (Please select one for each item) Drive Automobile Completely Able Take Medications Completely Able Use Telephone Completely Able Care for Appearance Completely Able Use Toilet Completely Able Bath / Shower Completely Able Dress Self Completely Able Feed Self Completely Able Walk Completely Able Get In / Out Bed Completely Able Housework Completely Able Prepare Meals  Completely [place] for Self Completely Able Electronic Signature(s) Signed: 02/09/2017 8:31:20 AM By: Gretta Cool, BSN, RN, CWS, Kim RN, BSN Entered By: Gretta Cool, BSN, RN, CWS, Kim on 02/09/2017 08:29:42 Benjamin Soto ([id number]) -------------------------------------------------------------------------------- Education Assessment Details Patient Name: Benjamin Soto Date of Service: 02/09/2017 8:00 AM Medical Record Number: [id number] Patient Account Number: [id number] Date of Birth/Sex: 11-Dec-1935 (81 y.o. Male) Treating RN: Cornell Barman Primary Care Kevyn Wengert: Elsie Stain Other Clinician: Referring Anastyn Ayars: Elsie Stain Treating Edee Nifong/Extender: Tito Dine in Treatment: 0 Primary Learner Assessed: Patient Learning Preferences/Education Level/Primary Language Learning Preference: Explanation, Demonstration Highest Education Level: College or Above Preferred Language: English Cognitive Barrier Assessment/Beliefs Language Barrier: No Translator Needed: No Memory Deficit: No Emotional Barrier: No Cultural/Religious Beliefs Affecting Medical No Care: Physical Barrier Assessment Impaired Vision: No Impaired Hearing: No Decreased Hand dexterity: No Knowledge/Comprehension Assessment Knowledge Level: Medium Comprehension Level: Medium Ability to understand written Medium instructions: Ability to understand verbal Medium instructions: Motivation Assessment Anxiety Level: Calm Cooperation: Cooperative Education Importance: Acknowledges Need Interest in Health Problems: Asks Questions Perception: Coherent Willingness to Engage in Self- Medium Management Activities: Readiness to Engage in Self- Medium Management Activities: Electronic Signature(s) Benjamin Soto, Benjamin Soto ([id number]) Signed: 02/09/2017 8:31:20 AM By: Gretta Cool, BSN, RN, CWS, Kim RN, BSN Entered By: Gretta Cool, BSN, RN, CWS, Kim on 02/09/2017 08:30:08 Benjamin Soto  ([id number]) -------------------------------------------------------------------------------- Fall Risk Assessment Details Patient Name: Benjamin Soto Date of Service: 02/09/2017 8:00 AM Medical Record Number: [id number] Patient Account Number: [id number] Date of Birth/Sex: 1936/01/03 (81 y.o. Male) Treating RN: Cornell Barman Primary Care Ciara Kagan: Elsie Stain Other Clinician: Referring Daphanie Oquendo: Elsie Stain Treating Keena Dinse/Extender: Ricard Dillon Weeks in Treatment: 0 Fall Risk Assessment Items Have you had 2 or more falls in the last 12 monthso 0 No Have you had any fall that resulted in injury in the last 12 monthso 0 No FALL RISK ASSESSMENT: History of falling - immediate or  within 3 months 25 Yes Secondary diagnosis 0 No Ambulatory aid None/bed rest/wheelchair/nurse 0 Yes Crutches/cane/walker 0 No Furniture 0 No IV Access/Saline Lock 0 No Gait/Training Normal/bed rest/immobile 0 Yes Weak 0 No Impaired 0 No Mental Status Oriented to own ability 0 Yes Electronic Signature(s) Signed: 02/09/2017 3:34:22 PM By: Gretta Cool, BSN, RN, CWS, Kim RN, BSN Previous Signature: 02/09/2017 8:31:20 AM Version By: Gretta Cool, BSN, RN, CWS, Kim RN, BSN Entered By: Gretta Cool, BSN, RN, CWS, Kim on 02/09/2017 08:47:29 Benjamin Soto ([id number]) -------------------------------------------------------------------------------- Foot Assessment Details Patient Name: Benjamin Soto Date of Service: 02/09/2017 8:00 AM Medical Record Number: [id number] Patient Account Number: [id number] Date of Birth/Sex: August 22, 1936 (81 y.o. Male) Treating RN: Cornell Barman Primary Care Graviel Payeur: Elsie Stain Other Clinician: Referring Suzi Hernan: Elsie Stain Treating Linday Rhodes/Extender: Ricard Dillon Weeks in Treatment: 0 Foot Assessment Items Site Locations + = Sensation present, - = Sensation absent, C = Callus, U = Ulcer R = Redness, W = Warmth, M = Maceration, PU = Pre-ulcerative lesion F = Fissure, S =  Swelling, D = Dryness Assessment Right: Left: Other Deformity: No No Prior Foot Ulcer: No No Prior Amputation: No No Charcot Joint: No No Ambulatory Status: Ambulatory With Help Assistance Device: Cane GaitEnergy manager) Signed: 02/09/2017 3:34:22 PM By: Gretta Cool, BSN, RN, CWS, Kim RN, BSN Entered By: Gretta Cool, BSN, RN, CWS, Kim on 02/09/2017 08:31:11 Benjamin Soto ([id number]) -------------------------------------------------------------------------------- Nutrition Risk Assessment Details Patient Name: Benjamin Soto Date of Service: 02/09/2017 8:00 AM Medical Record Number: [id number] Patient Account Number: [id number] Date of Birth/Sex: 1936-01-17 (81 y.o. Male) Treating RN: Cornell Barman Primary Care Kahleb Mcclane: Elsie Stain Other Clinician: Referring Bard Haupert: Elsie Stain Treating Beauden Tremont/Extender: Ricard Dillon Weeks in Treatment: 0 Height (in): 69 Weight (lbs): 185 Body Mass Index (BMI): 27.3 Nutrition Risk Assessment Items NUTRITION RISK SCREEN: I have an illness or condition that made me change the kind and/or 0 No amount of food I eat I eat fewer than two meals per day 0 No I eat few fruits and vegetables, or milk products 0 No I have three or more drinks of beer, liquor or wine almost every day 0 No I have tooth or mouth problems that make it hard for me to eat 0 No I don't always have enough money to buy the food I need 0 No I eat alone most of the time 0 No I take three or more different prescribed or over-the-counter drugs a 1 Yes day Without wanting to, I have lost or gained 10 pounds in the last six 0 No months I am not always physically able to shop, cook and/or feed myself 0 No Nutrition Protocols Good Risk Protocol 0 No interventions needed Moderate Risk Protocol Electronic Signature(s) Signed: 02/09/2017 8:31:20 AM By: Gretta Cool, BSN, RN, CWS, Kim RN, BSN Entered By: Gretta Cool, BSN, RN, CWS, Kim on 02/09/2017 08:30:42

## 2017-02-11 ENCOUNTER — Ambulatory Visit (INDEPENDENT_AMBULATORY_CARE_PROVIDER_SITE_OTHER): Payer: Medicare Other

## 2017-02-11 DIAGNOSIS — Z5181 Encounter for therapeutic drug level monitoring: Secondary | ICD-10-CM

## 2017-02-11 LAB — POCT INR: INR: 2

## 2017-02-11 NOTE — Patient Instructions (Signed)
Pre visit review using our clinic review tool, if applicable. No additional management support is needed unless otherwise documented below in the visit note. 

## 2017-02-12 LAB — AEROBIC CULTURE W GRAM STAIN (SUPERFICIAL SPECIMEN)

## 2017-02-15 NOTE — Progress Notes (Signed)
Agree, thanks.  Please update this note so it can be closed.

## 2017-02-16 ENCOUNTER — Encounter: Payer: Medicare Other | Admitting: Internal Medicine

## 2017-02-16 DIAGNOSIS — L97223 Non-pressure chronic ulcer of left calf with necrosis of muscle: Secondary | ICD-10-CM | POA: Diagnosis not present

## 2017-02-16 DIAGNOSIS — E11610 Type 2 diabetes mellitus with diabetic neuropathic arthropathy: Secondary | ICD-10-CM | POA: Diagnosis not present

## 2017-02-16 DIAGNOSIS — I130 Hypertensive heart and chronic kidney disease with heart failure and stage 1 through stage 4 chronic kidney disease, or unspecified chronic kidney disease: Secondary | ICD-10-CM | POA: Diagnosis not present

## 2017-02-16 DIAGNOSIS — Z7901 Long term (current) use of anticoagulants: Secondary | ICD-10-CM | POA: Diagnosis not present

## 2017-02-16 DIAGNOSIS — S81812D Laceration without foreign body, left lower leg, subsequent encounter: Secondary | ICD-10-CM | POA: Diagnosis not present

## 2017-02-16 DIAGNOSIS — H409 Unspecified glaucoma: Secondary | ICD-10-CM | POA: Diagnosis not present

## 2017-02-16 DIAGNOSIS — M1990 Unspecified osteoarthritis, unspecified site: Secondary | ICD-10-CM | POA: Diagnosis not present

## 2017-02-16 DIAGNOSIS — J449 Chronic obstructive pulmonary disease, unspecified: Secondary | ICD-10-CM | POA: Diagnosis not present

## 2017-02-16 DIAGNOSIS — E1122 Type 2 diabetes mellitus with diabetic chronic kidney disease: Secondary | ICD-10-CM | POA: Diagnosis not present

## 2017-02-16 DIAGNOSIS — I509 Heart failure, unspecified: Secondary | ICD-10-CM | POA: Diagnosis not present

## 2017-02-16 DIAGNOSIS — S81802A Unspecified open wound, left lower leg, initial encounter: Secondary | ICD-10-CM | POA: Diagnosis not present

## 2017-02-16 DIAGNOSIS — N189 Chronic kidney disease, unspecified: Secondary | ICD-10-CM | POA: Diagnosis not present

## 2017-02-16 DIAGNOSIS — I4891 Unspecified atrial fibrillation: Secondary | ICD-10-CM | POA: Diagnosis not present

## 2017-02-17 NOTE — Progress Notes (Signed)
JERMANY, RIMEL ([id number]) Visit Report for 02/16/2017 Chief Complaint Document Details Patient Name: Benjamin Soto, Benjamin Soto Date of Service: 02/16/2017 12:30 PM Medical Record Number: [id number] Patient Account Number: [id number] Date of Birth/Sex: 1936/07/14 (81 y.o. Male) Treating RN: Montey Hora Primary Care Provider: Elsie Stain Other Clinician: Referring Provider: Elsie Stain Treating Provider/Extender: Tito Dine in Treatment: 1 Information Obtained from: Patient Chief Complaint 02/09/17; Patient is here for review of a traumatic wound on the left leg anteriory Electronic Signature(s) Signed: 02/16/2017 5:12:21 PM By: Linton Ham MD Entered By: Linton Ham on 02/16/2017 13:01:38 Benjamin Soto ([id number]) -------------------------------------------------------------------------------- Debridement Details Patient Name: Benjamin Soto Date of Service: 02/16/2017 12:30 PM Medical Record Number: [id number] Patient Account Number: [id number] Date of Birth/Sex: 1935/12/04 (81 y.o. Male) Treating RN: Montey Hora Primary Care Provider: Elsie Stain Other Clinician: Referring Provider: Elsie Stain Treating Provider/Extender: Tito Dine in Treatment: 1 Debridement Performed for Wound #1 Left,Midline Lower Leg Assessment: Performed By: Physician Ricard Dillon, MD Debridement: Debridement Pre-procedure Verification/Time Out Yes - 12:52 Taken: Start Time: 12:52 Pain Control: Lidocaine 4% Topical Solution Level: Skin/Subcutaneous Tissue Total Area Debrided (L x 3.8 (cm) x 2.3 (cm) = 8.74 (cm) W): Tissue and other Viable, Non-Viable, Eschar, Fibrin/Slough, Subcutaneous material debrided: Instrument: Curette Bleeding: Moderate Hemostasis Achieved: Pressure End Time: 12:55 Procedural Pain: 0 Post Procedural Pain: 0 Response to Treatment: Procedure was tolerated well Post Debridement Measurements of Total Wound Length: (cm)  3.8 Width: (cm) 2.3 Depth: (cm) 0.4 Volume: (cm) 2.746 Character of Wound/Ulcer Post Improved Debridement: Post Procedure Diagnosis Same as Pre-procedure Electronic Signature(s) Signed: 02/16/2017 5:12:21 PM By: Linton Ham MD Signed: 02/16/2017 5:22:44 PM By: Montey Hora Entered By: Linton Ham on 02/16/2017 13:01:28 Benjamin Soto ([id number]) -------------------------------------------------------------------------------- HPI Details Patient Name: Benjamin Soto Date of Service: 02/16/2017 12:30 PM Medical Record Number: [id number] Patient Account Number: [id number] Date of Birth/Sex: 20-Jul-1936 (81 y.o. Male) Treating RN: Montey Hora Primary Care Provider: Elsie Stain Other Clinician: Referring Provider: Elsie Stain Treating Provider/Extender: Ricard Dillon Weeks in Treatment: 1 History of Present Illness HPI Description: 02/09/17; 81 year old man with a hx of type 2 DM, smoker. He suffered a fall 17 days ago while working on the [employer] of his trailer. He didn't realized anything had happed until he returned home. Has been applying polysporin. Received Cephalexin finishes on Saturday. No prior wound history. ABI's 0.96 on the left. No known hx of PAD. Hx of Afib on Coumadin,chf,st 2 crf 02/16/17; patient arrives today with the condition of his wound improved he is on silver alginate. Culture of the divot medially last week grew 2 different types of enterococcus including Enterococcus faecalis. He is on Augmentin. Surface of the wound looks better. Still debridement required today Electronic Signature(s) Signed: 02/16/2017 5:12:21 PM By: Linton Ham MD Entered By: Linton Ham on 02/16/2017 13:02:21 Benjamin Soto, Benjamin Soto ([id number]) -------------------------------------------------------------------------------- Physical Exam Details Patient Name: Benjamin Soto Date of Service: 02/16/2017 12:30 PM Medical Record Number: [id number] Patient Account  Number: [id number] Date of Birth/Sex: [birth year] (81 y.o. Male) Treating RN: Montey Hora Primary Care Provider: Elsie Stain Other Clinician: Referring Provider: Elsie Stain Treating Provider/Extender: Ricard Dillon Weeks in Treatment: 1 Constitutional Sitting or standing Blood Pressure is within target range for patient.. Pulse regular and within target range for patient.Marland Kitchen Respirations regular, non-labored and within target range.. Temperature is normal and within the target range for the patient.Marland Kitchen appears in no distress. Cardiovascular Pedal pulses palpable and strong on the left.. Notes When exam; anterior left leg. Surface  of the wound looks a lot better. There is however still some adherent necrotic surface over most of the wound bed. Using a #5 curet this was aggressively debrided to a better surface. He still has the original divot medially. There is no surrounding erythema Electronic Signature(s) Signed: 02/16/2017 5:12:21 PM By: Linton Ham MD Entered By: Linton Ham on 02/16/2017 13:03:53 Benjamin Soto ([id number]) -------------------------------------------------------------------------------- Physician Orders Details Patient Name: Benjamin Soto Date of Service: 02/16/2017 12:30 PM Medical Record Number: [id number] Patient Account Number: [id number] Date of Birth/Sex: 1936/01/04 (81 y.o. Male) Treating RN: Montey Hora Primary Care Provider: Elsie Stain Other Clinician: Referring Provider: Elsie Stain Treating Provider/Extender: Tito Dine in Treatment: 1 Verbal / Phone Orders: No Diagnosis Coding Wound Cleansing Wound #1 Left,Midline Lower Leg o Clean wound with Normal Saline. o May Shower, gently pat wound dry prior to applying new dressing. Anesthetic Wound #1 Left,Midline Lower Leg o Topical Lidocaine 4% cream applied to wound bed prior to debridement Primary Wound Dressing Wound #1 Left,Midline Lower Leg o  Aquacel Ag Secondary Dressing Wound #1 Left,Midline Lower Leg o Dry Gauze o Boardered Foam Dressing - or telfa island dressing Dressing Change Frequency Wound #1 Left,Midline Lower Leg o Change dressing every day. Follow-up Appointments Wound #1 Left,Midline Lower Leg o Return Appointment in 1 week. Edema Control Wound #1 Left,Midline Lower Leg o Elevate legs to the level of the heart and pump ankles as often as possible Additional Orders / Instructions Wound #1 Left,Midline Lower Leg o Increase protein intake. o Other: - Please add vitamin A, vitamin C and zinc supplements to your diet Benjamin Soto, Benjamin Soto ([id number]) Electronic Signature(s) Signed: 02/16/2017 5:12:21 PM By: Linton Ham MD Signed: 02/16/2017 5:22:44 PM By: Montey Hora Entered By: Montey Hora on 02/16/2017 12:55:22 Benjamin Soto, Benjamin Soto ([id number]) -------------------------------------------------------------------------------- Problem List Details Patient Name: Benjamin Soto Date of Service: 02/16/2017 12:30 PM Medical Record Number: [id number] Patient Account Number: [id number] Date of Birth/Sex: Oct 29, 1935 (81 y.o. 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 Male) Treating RN: Montey Hora Primary Care Provider: Elsie Stain Other Clinician: Referring Provider: Elsie Stain Treating Provider/Extender: Ricard Dillon Weeks in Treatment: 1 Subjective Chief Complaint Information obtained from Patient 02/09/17; Patient is here for review of a traumatic wound on the left leg anteriory History of Present Illness (HPI) 02/09/17; 81 year old man with a hx of type 2 DM, smoker. He suffered a fall 17 days ago while working on the [employer] of his trailer. He didn't realized anything had happed until he returned home. Has been applying polysporin. Received Cephalexin finishes on Saturday. No prior wound history. ABI's 0.96 on the left. No known hx of PAD. Hx of Afib on Coumadin,chf,st 2 crf 02/16/17; patient arrives today with the condition of his wound improved he is on silver alginate. Culture of the divot medially last week grew 2 different types of enterococcus including Enterococcus faecalis. He is  on Augmentin. Surface of the wound looks better. Still debridement required today Objective Constitutional Sitting or standing Blood Pressure is within target range for patient.. Pulse regular and within target range for patient.Marland Kitchen Respirations regular, non-labored and within target range.. Temperature is normal and within the target range for the patient.Marland Kitchen appears in no distress. Vitals Time Taken: 12:37 PM, Height: 69 in, Weight: 185 lbs, BMI: 27.3, Temperature: 97.9 F, Pulse: 72 bpm, Respiratory Rate: 16 breaths/min, Blood Pressure: 129/79 mmHg. Cardiovascular Pedal pulses palpable and strong on the left.. General Notes: When exam; anterior left leg. Surface of the wound looks a lot better. There is however still some adherent necrotic surface over most of the wound bed. Using a #5 curet this was aggressively debrided to  a better surface. He still has the original divot medially. There is no surrounding erythema Integumentary (Hair, Skin) Benjamin Soto, Benjamin Soto ([id number]) Wound #1 status is Open. Original cause of wound was Trauma. The wound is located on the Left,Midline Lower Leg. The wound measures 3.8cm length x 2.3cm width x 0.3cm depth; 6.864cm^2 area and 2.059cm^3 volume. There is Fat Layer (Subcutaneous Tissue) Exposed exposed. There is no tunneling or undermining noted. There is a medium amount of serosanguineous drainage noted. The wound margin is flat and intact. There is large (67-100%) red granulation within the wound bed. There is a small (1-33%) amount of necrotic tissue within the wound bed including Eschar and Adherent Slough. The periwound skin appearance exhibited: Ecchymosis. The periwound skin appearance did not exhibit: Callus, Crepitus, Excoriation, Induration, Rash, Scarring, Dry/Scaly, Maceration, Atrophie Blanche, Cyanosis, Hemosiderin Staining, Mottled, Pallor, Rubor, Erythema. Periwound temperature was noted as No Abnormality. The periwound has tenderness on palpation. 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 There was a Skin/Subcutaneous Tissue Debridement ([id number]) debridement with total area of 8.74 sq cm performed by Ricard Dillon, MD. with the following instrument(s): Curette to remove Viable and Non-Viable tissue/material including Fibrin/Slough, Eschar, and Subcutaneous after achieving pain control using Lidocaine 4% Topical Solution. A time out was conducted at 12:52, prior to the start of the procedure. A Moderate amount of bleeding was controlled with Pressure. The procedure was tolerated well with a  pain level of 0 throughout and a pain level of 0 following the procedure. Post Debridement Measurements: 3.8cm length x 2.3cm width x 0.4cm depth; 2.746cm^3 volume. Character of Wound/Ulcer Post Debridement is improved. Post procedure Diagnosis Wound #1: Same as Pre-Procedure Plan Wound Cleansing: Benjamin Soto, Benjamin Soto ([id number]) Wound #1 Left,Midline Lower Leg: Clean wound with Normal Saline. May Shower, gently pat wound dry prior to applying new dressing. Anesthetic: Wound #1 Left,Midline Lower Leg: Topical Lidocaine 4% cream applied to wound bed prior to debridement Primary Wound Dressing: Wound #1 Left,Midline Lower Leg: Aquacel Ag Secondary Dressing: Wound #1 Left,Midline Lower Leg: Dry Gauze Boardered Foam Dressing - or telfa island dressing Dressing Change Frequency: Wound #1 Left,Midline Lower Leg: Change dressing every day. Follow-up Appointments: Wound #1 Left,Midline Lower Leg: Return Appointment in 1 week. Edema Control: Wound #1 Left,Midline Lower Leg: Elevate legs to the level of the heart and pump ankles as often as possible Additional Orders / Instructions: Wound #1 Left,Midline Lower Leg: Increase protein intake. Other: - Please add vitamin A, vitamin C and zinc supplements to your diet This week we  continued with a silver alginate Finish the Augmentin no further antibiotics Electronic Signature(s) Signed: 02/16/2017 5:12:21 PM By: Linton Ham MD Entered By: Linton Ham on 02/16/2017 13:04:18 Benjamin Soto ([id number]) -------------------------------------------------------------------------------- SuperBill Details Patient Name: Benjamin Soto Date of Service: 02/16/2017 Medical Record Number: [id number] Patient Account Number: [id number] Date of Birth/Sex: 1936/01/18 (81 y.o. 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 Facility Procedures CPT4 Code Description: [id number] 11042 - DEB SUBQ TISSUE 20 SQ CM/< ICD-10 Description Diagnosis S81.812D Laceration without foreign body, left lower leg, su L97.223 Non-pressure chronic ulcer of left calf with necros Modifier: bsequent enc is of muscle Quantity: 1 ounter Physician Procedures CPT4 Code Description: [id number] 11042 - WC PHYS SUBQ TISS 20 SQ CM ICD-10 Description Diagnosis S81.812D Laceration without foreign body, left lower leg, su L97.223 Non-pressure chronic ulcer of left calf with necros Modifier: bsequent enco is of muscle Quantity: 1 Personal assistant) Signed: 02/16/2017 5:12:21 PM By: Linton Ham MD Entered By: Linton Ham on 02/16/2017 13:04:30

## 2017-02-17 NOTE — Progress Notes (Signed)
Benjamin Soto, Benjamin Soto ([id number]) Visit Report for 02/16/2017 Arrival Information Details Patient Name: Benjamin Soto, Benjamin Soto Date of Service: 02/16/2017 12:30 PM Medical Record Number: [id number] Patient Account Number: [id number] Date of Birth/Sex: 08/25/1936 (81 y.o. Male) Treating RN: Montey Hora Primary Care Joliet Mallozzi: Elsie Stain Other Clinician: Referring Orest Dygert: Elsie Stain Treating Kip Cropp/Extender: Tito Dine in Treatment: 1 Visit Information History Since Last Visit Added or deleted any medications: No Patient Arrived: Ambulatory Any new allergies or adverse reactions: No Arrival Time: 12:33 Had a fall or experienced change in No Accompanied By: spouse activities of daily living that may affect Transfer Assistance: None risk of falls: Patient Identification Verified: Yes Signs or symptoms of abuse/neglect since last No Secondary Verification Process Yes visito Completed: Hospitalized since last visit: No Patient Has Alerts: Yes Has Dressing in Place as Prescribed: Yes Patient Alerts: Patient on Blood Pain Present Now: No Thinner warfarin Type II Diabetic Electronic Signature(s) Signed: 02/16/2017 5:22:44 PM By: Montey Hora Entered By: Montey Hora on 02/16/2017 12:37:06 Benjamin Soto ([id number]) -------------------------------------------------------------------------------- Encounter Discharge Information Details Patient Name: Benjamin Soto Date of Service: 02/16/2017 12:30 PM Medical Record Number: [id number] Patient Account Number: [id number] Date of Birth/Sex: 03/06/1936 (81 y.o. Male) Treating RN: Montey Hora Primary Care Rooney Swails: Elsie Stain Other Clinician: Referring Aariz Maish: Elsie Stain Treating Nelle Sayed/Extender: Tito Dine in Treatment: 1 Encounter Discharge Information Items Discharge Pain Level: 0 Discharge Condition: Stable Ambulatory Status: Ambulatory Discharge Destination: Home Transportation:  Private Auto Accompanied By: spouse Schedule Follow-up Appointment: Yes Medication Reconciliation completed and provided to Patient/Care No Shmuel Girgis: Provided on Clinical Summary of Care: 02/16/2017 Form Type Recipient Paper Patient HD Electronic Signature(s) Signed: 02/16/2017 1:06:45 PM By: Ruthine Dose Entered By: Ruthine Dose on 02/16/2017 13:06:44 Benjamin Soto ([id number]) -------------------------------------------------------------------------------- Lower Extremity Assessment Details Patient Name: Benjamin Soto Date of Service: 02/16/2017 12:30 PM Medical Record Number: [id number] Patient Account Number: [id number] Date of Birth/Sex: [birth year] (81 y.o. Male) Treating RN: Montey Hora Primary Care Arlie Riker: Elsie Stain Other Clinician: Referring Camala Talwar: Elsie Stain Treating Marijane Trower/Extender: Ricard Dillon Weeks in Treatment: 1 Vascular Assessment Pulses: Dorsalis Pedis Palpable: [Left:Yes] Posterior Tibial Extremity colors, hair growth, and conditions: Extremity Color: [Left:Normal] Hair Growth on Extremity: [Left:Yes] Temperature of Extremity: [Left:Warm] Capillary Refill: [Left:< 3 seconds] Electronic Signature(s) Signed: 02/16/2017 12:44:34 PM By: Montey Hora Entered By: Montey Hora on 02/16/2017 12:44:34 Benjamin Soto ([id number]) -------------------------------------------------------------------------------- Multi Wound Chart Details Patient Name: Benjamin Soto Date of Service: 02/16/2017 12:30 PM Medical Record Number: [id number] Patient Account Number: [id number] Date of Birth/Sex: 04/06/1936 (81 y.o. Male) Treating RN: Montey Hora Primary Care Crescent Gotham: Elsie Stain Other Clinician: Referring Guinn Delarosa: Elsie Stain Treating Noralee Dutko/Extender: Ricard Dillon Weeks in Treatment: 1 Vital Signs Height(in): 69 Pulse(bpm): 72 Weight(lbs): 185 Blood Pressure 129/79 (mmHg): Body Mass Index(BMI): 27 Temperature(F):  97.9 Respiratory Rate 16 (breaths/min): Photos: [N/A:N/A] Wound Location: Left Lower Leg - Midline N/A N/A Wounding Event: Trauma N/A N/A Primary Etiology: Trauma, Other N/A N/A Comorbid History: Glaucoma, Chronic N/A N/A Obstructive Pulmonary Disease (COPD), Arrhythmia, Coronary Artery Disease, Hypertension, Type II Diabetes, Osteoarthritis, Neuropathy Date Acquired: 01/23/2017 N/A N/A Weeks of Treatment: 1 N/A N/A Wound Status: Open N/A N/A Measurements L x W x D 3.8x2.3x0.3 N/A N/A (cm) Area (cm) : 6.864 N/A N/A Volume (cm) : 2.059 N/A N/A % Reduction in Area: -36.50% N/A N/A % Reduction in Volume: -309.30% N/A N/A Classification: Full Thickness Without N/A N/A Exposed Support Structures Benjamin Soto, Benjamin Soto ([id number]) HBO Classification: Grade 1 N/A N/A Exudate Amount: Medium N/A N/A Exudate Type: Serosanguineous  N/A N/A Exudate Color: red, brown N/A N/A Wound Margin: Flat and Intact N/A N/A Granulation Amount: Large (67-100%) N/A N/A Granulation Quality: Red N/A N/A Necrotic Amount: Small (1-33%) N/A N/A Necrotic Tissue: Eschar, Adherent Slough N/A N/A Exposed Structures: Fat Layer (Subcutaneous N/A N/A Tissue) Exposed: Yes Fascia: No Tendon: No Muscle: No Joint: No Bone: No Epithelialization: None N/A N/A Debridement: Debridement ([id number]- N/A N/A 11047) Pre-procedure 12:52 N/A N/A Verification/Time Out Taken: Pain Control: Lidocaine 4% Topical N/A N/A Solution Tissue Debrided: Necrotic/Eschar, N/A N/A Fibrin/Slough, Subcutaneous Level: Skin/Subcutaneous N/A N/A Tissue Debridement Area (sq 8.74 N/A N/A cm): Instrument: Curette N/A N/A Bleeding: Moderate N/A N/A Hemostasis Achieved: Pressure N/A N/A Procedural Pain: 0 N/A N/A Post Procedural Pain: 0 N/A N/A Debridement Treatment Procedure was tolerated N/A N/A Response: well Post Debridement 3.8x2.3x0.4 N/A N/A Measurements L x W x D (cm) Post Debridement 2.746 N/A N/A Volume: (cm) Periwound  Skin Texture: Excoriation: No N/A N/A Induration: No Callus: No Crepitus: No Rash: No Scarring: No Benjamin Soto, Benjamin Soto ([id number]) Periwound Skin Maceration: No N/A N/A Moisture: Dry/Scaly: No Periwound Skin Color: Ecchymosis: Yes N/A N/A Atrophie Blanche: No Cyanosis: No Erythema: No Hemosiderin Staining: No Mottled: No Pallor: No Rubor: No Temperature: No Abnormality N/A N/A Tenderness on Yes N/A N/A Palpation: Wound Preparation: Ulcer Cleansing: N/A N/A Rinsed/Irrigated with Saline Topical Anesthetic Applied: Other: lidociane 4% Procedures Performed: Debridement N/A N/A Treatment Notes Wound #1 (Left, Midline Lower Leg) 1. Cleansed with: Clean wound with Normal Saline 2. Anesthetic Topical Lidocaine 4% cream to wound bed prior to debridement 4. Dressing Applied: Aquacel Ag 5. Secondary Dressing Applied Dry [place] Signature(s) Signed: 02/16/2017 5:12:21 PM By: Linton Ham MD Previous Signature: 02/16/2017 12:44:50 PM Version By: Montey Hora Entered By: Linton Ham on 02/16/2017 13:01:15 Benjamin Soto ([id number]) -------------------------------------------------------------------------------- Multi-Disciplinary Care Plan Details Patient Name: Benjamin Soto Date of Service: 02/16/2017 12:30 PM Medical Record Number: [id number] Patient Account Number: [id number] Date of Birth/Sex: [birth year] (81 y.o. Male) Treating RN: Montey Hora Primary Care Chananya Canizalez: Elsie Stain Other Clinician: Referring Damier Disano: Elsie Stain Treating Micheil Klaus/Extender: Tito Dine in Treatment: 1 Active Inactive ` Abuse / Safety / Falls / Self Care Management Nursing Diagnoses: Potential for falls Goals: Patient will not experience any injury related to falls Date Initiated: 02/09/2017 Target Resolution Date: 04/23/2017 Goal Status: Active Interventions: Assess fall risk on admission and as needed Notes: ` Orientation to the  Wound Care Program Nursing Diagnoses: Knowledge deficit related to the wound healing center program Goals: Patient/caregiver will verbalize understanding of the [facility] Date Initiated: 02/09/2017 Target Resolution Date: 04/23/2017 Goal Status: Active Interventions: Provide education on orientation to the wound center Notes: ` Wound/Skin Impairment Nursing Diagnoses: Impaired tissue integrity Benjamin Soto, Benjamin Soto ([id number]) Goals: Patient/caregiver will verbalize understanding of skin care regimen Date Initiated: 02/09/2017 Target Resolution Date: 04/23/2017 Goal Status: Active Ulcer/skin breakdown will have a volume reduction of 30% by week 4 Date Initiated: 02/09/2017 Target Resolution Date: 04/23/2017 Goal Status: Active Ulcer/skin breakdown will have a volume reduction of 50% by week 8 Date Initiated: 02/09/2017 Target Resolution Date: 04/23/2017 Goal Status: Active Ulcer/skin breakdown will have a volume reduction of 80% by week 12 Date Initiated: 02/09/2017 Target Resolution Date: 04/23/2017 Goal Status: Active Ulcer/skin breakdown will heal within 14 weeks Date Initiated: 02/09/2017 Target Resolution Date: 04/23/2017 Goal Status: Active Interventions: Assess patient/caregiver ability to obtain necessary supplies Assess patient/caregiver ability to perform ulcer/skin care regimen upon admission and as needed Assess ulceration(s) every visit Notes: Electronic  Signature(s) Signed: 02/16/2017 12:44:42 PM By: Montey Hora Entered By: Montey Hora on 02/16/2017 12:44:41 Benjamin Soto ([id number]) -------------------------------------------------------------------------------- Pain Assessment Details Patient Name: Benjamin Soto Date of Service: 02/16/2017 12:30 PM Medical Record Number: [id number] Patient Account Number: [id number] Date of Birth/Sex: [birth year] (81 y.o. Male) Treating RN: Montey Hora Primary Care Dickey Caamano: Elsie Stain Other  Clinician: Referring Carlyle Mcelrath: Elsie Stain Treating Mel Langan/Extender: Tito Dine in Treatment: 1 Active Problems Location of Pain Severity and Description of Pain Patient Has Paino No Site Locations Pain Management and Medication Current Pain Management: Notes Topical or injectable lidocaine is offered to patient for acute pain when surgical debridement is performed. If needed, Patient is instructed to use over the counter pain medication for the following 24-48 hours after debridement. Wound care MDs do not prescribed pain medications. Patient has chronic pain or uncontrolled pain. Patient has been instructed to make an appointment with their Primary Care Physician for pain management. Electronic Signature(s) Signed: 02/16/2017 5:22:44 PM By: Montey Hora Entered By: Montey Hora on 02/16/2017 12:37:13 Benjamin Soto ([id number]) -------------------------------------------------------------------------------- Patient/Caregiver Education Details Patient Name: Benjamin Soto Date of Service: 02/16/2017 12:30 PM Medical Record Number: [id number] Patient Account Number: [id number] Date of Birth/Gender: 03/09/1936 (81 y.o. Male) Treating RN: Montey Hora Primary Care Physician: Elsie Stain Other Clinician: Referring Physician: Elsie Stain Treating Physician/Extender: Tito Dine in Treatment: 1 Education Assessment Education Provided To: Patient and Caregiver Education Topics Provided Wound/Skin Impairment: Handouts: Other: wound care as ordered Methods: Demonstration, Explain/Verbal Responses: State content correctly Electronic Signature(s) Signed: 02/16/2017 5:22:44 PM By: Montey Hora Entered By: Montey Hora on 02/16/2017 12:54:23 Benjamin Soto ([id number]) -------------------------------------------------------------------------------- Wound Assessment Details Patient Name: Benjamin Soto Date of Service: 02/16/2017 12:30  PM Medical Record Number: [id number] Patient Account Number: [id number] Date of Birth/Sex: 1935/10/16 (81 y.o. Male) Treating RN: Montey Hora Primary Care Khalis Hittle: Elsie Stain Other Clinician: Referring Yunis Voorheis: Elsie Stain Treating Tramell Piechota/Extender: Ricard Dillon Weeks in Treatment: 1 Wound Status Wound Number: 1 Primary Trauma, Other Etiology: Wound Location: Left Lower Leg - Midline Wound Open Wounding Event: Trauma Status: Date Acquired: 01/23/2017 Comorbid Glaucoma, Chronic Obstructive Weeks Of Treatment: 1 History: Pulmonary Disease (COPD), Arrhythmia, Clustered Wound: No Coronary Artery Disease, Hypertension, Type II Diabetes, Osteoarthritis, Neuropathy Photos Wound Measurements Length: (cm) 3.8 Width: (cm) 2.3 Depth: (cm) 0.3 Area: (cm) 6.864 Volume: (cm) 2.059 % Reduction in Area: -36.5% % Reduction in Volume: -309.3% Epithelialization: None Tunneling: No Undermining: No Wound Description Full Thickness Without Exposed Foul Odor A Classification: Support Structures Slough/Fibr Diabetic Severity Grade 1 (Wagner): Wound Margin: Flat and Intact Exudate Amount: Medium Exudate Type: Serosanguineous Exudate Color: red, brown fter Cleansing: No ino Yes Wound Bed Benjamin Soto, Benjamin Soto ([id number]) Granulation Amount: Large (67-100%) Exposed Structure Granulation Quality: Red Fascia Exposed: No Necrotic Amount: Small (1-33%) Fat Layer (Subcutaneous Tissue) Exposed: Yes Necrotic Quality: Eschar, Adherent Slough Tendon Exposed: No Muscle Exposed: No Joint Exposed: No Bone Exposed: No Periwound Skin Texture Texture Color No Abnormalities Noted: No No Abnormalities Noted: No Callus: No Atrophie Blanche: No Crepitus: No Cyanosis: No Excoriation: No Ecchymosis: Yes Induration: No Erythema: No Rash: No Hemosiderin Staining: No Scarring: No Mottled: No Pallor: No Moisture Rubor: No No Abnormalities Noted: No Dry / Scaly: No Temperature  / Pain Maceration: No Temperature: No Abnormality Tenderness on Palpation: Yes Wound Preparation Ulcer Cleansing: Rinsed/Irrigated with Saline Topical Anesthetic Applied: Other: lidociane 4%, Treatment Notes Wound #1 (Left, Midline Lower Leg) 1. Cleansed with: Clean wound with Normal Saline 2. Anesthetic Topical Lidocaine 4% cream to  wound bed prior to debridement 4. Dressing Applied: Aquacel Ag 5. Secondary Dressing Applied Dry Benjamin Soto Signature(s) Signed: 02/16/2017 12:43:57 PM By: Montey Hora Previous Signature: 02/16/2017 12:43:41 PM Version By: Montey Hora Entered By: Montey Hora on 02/16/2017 12:43:57 Benjamin Soto ([id number]) -------------------------------------------------------------------------------- [place] Details Patient Name: Benjamin Soto Date of Service: 02/16/2017 12:30 PM Medical Record Number: [id number] Patient Account Number: [id number] Date of Birth/Sex: 16-Jul-1936 (81 y.o. Male) Treating RN: Montey Hora Primary Care Germani Gavilanes: Elsie Stain Other Clinician: Referring Tanishka Drolet: Elsie Stain Treating Vail Vuncannon/Extender: Tito Dine in Treatment: 1 Vital Signs Time Taken: 12:37 Temperature (F): 97.9 Height (in): 69 Pulse (bpm): 72 Weight (lbs): 185 Respiratory Rate (breaths/min): 16 Body Mass Index (BMI): 27.3 Blood Pressure (mmHg): 129/79 Reference Range: 80 - 120 mg / dl Electronic Signature(s) Signed: 02/16/2017 5:22:44 PM By: Montey Hora Entered By: Montey Hora on 02/16/2017 12:37:46

## 2017-02-23 ENCOUNTER — Ambulatory Visit: Payer: Medicare Other

## 2017-02-23 ENCOUNTER — Encounter: Payer: Medicare Other | Attending: Internal Medicine | Admitting: Internal Medicine

## 2017-02-23 DIAGNOSIS — E11610 Type 2 diabetes mellitus with diabetic neuropathic arthropathy: Secondary | ICD-10-CM | POA: Insufficient documentation

## 2017-02-23 DIAGNOSIS — M1990 Unspecified osteoarthritis, unspecified site: Secondary | ICD-10-CM | POA: Diagnosis not present

## 2017-02-23 DIAGNOSIS — I130 Hypertensive heart and chronic kidney disease with heart failure and stage 1 through stage 4 chronic kidney disease, or unspecified chronic kidney disease: Secondary | ICD-10-CM | POA: Diagnosis not present

## 2017-02-23 DIAGNOSIS — J449 Chronic obstructive pulmonary disease, unspecified: Secondary | ICD-10-CM | POA: Diagnosis not present

## 2017-02-23 DIAGNOSIS — I509 Heart failure, unspecified: Secondary | ICD-10-CM | POA: Diagnosis not present

## 2017-02-23 DIAGNOSIS — N189 Chronic kidney disease, unspecified: Secondary | ICD-10-CM | POA: Insufficient documentation

## 2017-02-23 DIAGNOSIS — H409 Unspecified glaucoma: Secondary | ICD-10-CM | POA: Insufficient documentation

## 2017-02-23 DIAGNOSIS — I4891 Unspecified atrial fibrillation: Secondary | ICD-10-CM | POA: Diagnosis not present

## 2017-02-23 DIAGNOSIS — W19XXXD Unspecified fall, subsequent encounter: Secondary | ICD-10-CM | POA: Diagnosis not present

## 2017-02-23 DIAGNOSIS — E1122 Type 2 diabetes mellitus with diabetic chronic kidney disease: Secondary | ICD-10-CM | POA: Diagnosis not present

## 2017-02-23 DIAGNOSIS — F17200 Nicotine dependence, unspecified, uncomplicated: Secondary | ICD-10-CM | POA: Insufficient documentation

## 2017-02-23 DIAGNOSIS — S81812D Laceration without foreign body, left lower leg, subsequent encounter: Secondary | ICD-10-CM | POA: Diagnosis not present

## 2017-02-23 DIAGNOSIS — L97223 Non-pressure chronic ulcer of left calf with necrosis of muscle: Secondary | ICD-10-CM | POA: Diagnosis not present

## 2017-02-23 DIAGNOSIS — S81802A Unspecified open wound, left lower leg, initial encounter: Secondary | ICD-10-CM | POA: Diagnosis not present

## 2017-02-23 DIAGNOSIS — Z7901 Long term (current) use of anticoagulants: Secondary | ICD-10-CM | POA: Insufficient documentation

## 2017-02-24 NOTE — Progress Notes (Signed)
YASUO, PHIMMASONE ([id number]) Visit Report for 02/23/2017 HPI Details Patient Name: Benjamin Soto, Benjamin Soto Date of Service: 02/23/2017 11:00 AM Medical Record Number: [id number] Patient Account Number: [id number] Date of Birth/Sex: March 27, 1936 (81 y.o. Male) Treating RN: Montey Hora Primary Care Provider: Elsie Stain Other Clinician: Referring Provider: Elsie Stain Treating Provider/Extender: Ricard Dillon Weeks in Treatment: 2 History of Present Illness HPI Description: 02/09/17; 80 year old man with a hx of type 2 DM, smoker. He suffered a fall 17 days ago while working on the [employer] of his trailer. He didn't realized anything had happed until he returned home. Has been applying polysporin. Received Cephalexin finishes on Saturday. No prior wound history. ABI's 0.96 on the left. No known hx of PAD. Hx of Afib on Coumadin,chf,st 2 crf 02/16/17; patient arrives today with the condition of his wound improved he is on silver alginate. Culture of the divot medially last week grew 2 different types of enterococcus including Enterococcus faecalis. He is on Augmentin. Surface of the wound looks better. Still debridement required today 02/23/17; traumatic wound on the left anterior leg. He has finished Augmentin. He found debridement last week painful fortunately none was required today Electronic Signature(s) Signed: 02/23/2017 5:27:39 PM By: Linton Ham MD Entered By: Linton Ham on 02/23/2017 12:09:32 Benjamin Soto ([id number]) -------------------------------------------------------------------------------- Physical Exam Details Patient Name: Benjamin Soto Date of Service: 02/23/2017 11:00 AM Medical Record Number: [id number] Patient Account Number: [id number] Date of Birth/Sex: 1936-09-13 (81 y.o. Male) Treating RN: Montey Hora Primary Care Provider: Elsie Stain Other Clinician: Referring Provider: Elsie Stain Treating Provider/Extender: Ricard Dillon Weeks in Treatment:  2 Constitutional Sitting or standing Blood Pressure is within target range for patient.. Pulse regular and within target range for patient.Marland Kitchen Respirations regular, non-labored and within target range.. Temperature is normal and within the target range for the patient.Marland Kitchen appears in no distress. Eyes Conjunctivae clear. No discharge. Respiratory Respiratory effort is easy and symmetric bilaterally. Rate is normal at rest and on room air.. Cardiovascular Pedal pulses palpable and strong bilaterally.. Edema is well controlled. Lymphatic None palpable in the popliteal or inguinal area. Psychiatric No evidence of depression, anxiety, or agitation. Calm, cooperative, and communicative. Appropriate interactions and affect.. Notes Wound exam; the surface of the left leg looks better. Small divot medially at 0.3 cm remains. Still some surface eschar medially however I did not think anything needed debridement today. No evidence of surrounding infection Electronic Signature(s) Signed: 02/23/2017 5:27:39 PM By: Linton Ham MD Entered By: Linton Ham on 02/23/2017 12:16:19 Benjamin Soto ([id number]) -------------------------------------------------------------------------------- Physician Orders Details Patient Name: Benjamin Soto Date of Service: 02/23/2017 11:00 AM Medical Record Number: [id number] Patient Account Number: [id number] Date of Birth/Sex: 1936-07-10 (81 y.o. Male) Treating RN: Montey Hora Primary Care Provider: Elsie Stain Other Clinician: Referring Provider: Elsie Stain Treating Provider/Extender: Tito Dine in Treatment: 2 Verbal / Phone Orders: No Diagnosis Coding Wound Cleansing Wound #1 Left,Midline Lower Leg o Clean wound with Normal Saline. o May Shower, gently pat wound dry prior to applying new dressing. Anesthetic Wound #1 Left,Midline Lower Leg o Topical Lidocaine 4% cream applied to wound bed prior to debridement Primary Wound  Dressing Wound #1 Left,Midline Lower Leg o Aquacel Ag Secondary Dressing Wound #1 Left,Midline Lower Leg o Dry Gauze o Boardered Foam Dressing - or telfa island dressing Dressing Change Frequency Wound #1 Left,Midline Lower Leg o Change dressing every day. Follow-up Appointments Wound #1 Left,Midline Lower Leg o Return Appointment in 1 week. Edema Control Wound #1 Left,Midline Lower Leg o  Elevate legs to the level of the heart and pump ankles as often as possible Additional Orders / Instructions Wound #1 Left,Midline Lower Leg o Increase protein intake. o Other: - Please add vitamin A, vitamin C and zinc supplements to your diet Benjamin Soto, Benjamin Soto ([id number]) Electronic Signature(s) Signed: 02/23/2017 4:59:08 PM By: Montey Hora Signed: 02/23/2017 5:27:39 PM By: Linton Ham MD Entered By: Montey Hora on 02/23/2017 11:26:35 Benjamin Soto ([id number]) -------------------------------------------------------------------------------- Problem List Details Patient Name: Benjamin Soto Date of Service: 02/23/2017 11:00 AM Medical Record Number: [id number] Patient Account Number: [id number] Date of Birth/Sex: Aug 09, 1936 (81 y.o. 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 Male) Treating RN: Montey Hora Primary Care Provider: Elsie Stain Other Clinician: Referring Provider: Elsie Stain Treating Provider/Extender: Ricard Dillon Weeks in Treatment: 2 Subjective History of Present Illness (HPI) 02/09/17; 81 year old man with a hx of type 2 DM, smoker. He suffered a fall 17 days ago while working on the [employer] of his trailer. He didn't realized anything had happed until he returned home. Has been applying polysporin. Received Cephalexin finishes on Saturday. No prior wound history. ABI's 0.96 on the left. No known hx of PAD. Hx of Afib on Coumadin,chf,st 2 crf 02/16/17; patient arrives today with the condition of his wound improved he is on silver alginate. Culture of the divot medially last week grew 2 different types of enterococcus including Enterococcus faecalis. He is on Augmentin. Surface of the wound looks better. Still debridement required today 02/23/17; traumatic wound on the left anterior leg. He has finished Augmentin. He found debridement last week painful fortunately none was required today Objective Constitutional Sitting or standing Blood Pressure is within target range for patient.. Pulse regular and within target range for patient.Marland Kitchen Respirations regular, non-labored and within target range.. Temperature is normal and within the target range for the patient.Marland Kitchen appears in no distress. Vitals Time Taken: 11:08 AM, Height: 69 in, Weight: 185 lbs, BMI: 27.3, Temperature: 98.2 F, Pulse: 66 bpm, Respiratory Rate: 18 breaths/min, Blood Pressure: 138/63 mmHg. Eyes Conjunctivae clear. No discharge. Respiratory Respiratory effort is easy and symmetric bilaterally. Rate is normal at rest and on room air.. Cardiovascular Pedal pulses palpable and strong  bilaterally.. Edema is well controlled. Lymphatic None palpable in the popliteal or inguinal area. Benjamin Soto, Benjamin Soto ([id number]) Psychiatric No evidence of depression, anxiety, or agitation. Calm, cooperative, and communicative. Appropriate interactions and affect.. General Notes: Wound exam; the surface of the left leg looks better. Small divot medially at 0.3 cm remains. Still some surface eschar medially however I did not think anything needed debridement today. No evidence of surrounding infection Integumentary (Hair, Skin) Wound #1 status is Open. Original cause of wound was Trauma. The wound is located on the Left,Midline Lower Leg. The wound measures 4.2cm length x 2.1cm width x 0.3cm depth; 6.927cm^2 area and 2.078cm^3 volume. There is Fat Layer (Subcutaneous Tissue) Exposed exposed. There is  no tunneling or undermining noted. There is a medium amount of serosanguineous drainage noted. The wound margin is flat and intact. There is large (67-100%) red granulation within the wound bed. There is a small (1-33%) amount of necrotic tissue within the wound bed including Eschar and Adherent Slough. The periwound skin appearance exhibited: Ecchymosis. The periwound skin appearance did not exhibit: Callus, Crepitus, Excoriation, Induration, Rash, Scarring, Dry/Scaly, Maceration, Atrophie Blanche, Cyanosis, Hemosiderin Staining, Mottled, Pallor, Rubor, Erythema. Periwound temperature was noted as No Abnormality. The periwound has tenderness on palpation. 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 May Shower, gently pat wound dry prior to applying new dressing. Anesthetic: Wound #1 Left,Midline Lower Leg: Topical Lidocaine 4% cream applied to wound bed prior  to debridement Primary Wound Dressing: Wound #1 Left,Midline Lower Leg: Aquacel Ag Secondary Dressing: Benjamin Soto, Benjamin Soto ([id number]) Wound #1 Left,Midline Lower Leg: Dry Gauze Boardered Foam Dressing - or telfa island dressing Dressing Change Frequency: Wound #1 Left,Midline Lower Leg: Change dressing every day. Follow-up Appointments: Wound #1 Left,Midline Lower Leg: Return Appointment in 1 week. Edema Control: Wound #1 Left,Midline Lower Leg: Elevate legs to the level of the heart and pump ankles as often as possible Additional Orders / Instructions: Wound #1 Left,Midline Lower Leg: Increase protein intake. Other: - Please add vitamin A, vitamin C and zinc supplements to your diet #1 we are going to continue with a silver alginate/Aquacel Ag border foam with Telfa dressing. #2 he has completed his Augmentin I don't think anything else is necessary here, he had a previous course of Keflex Electronic Signature(s) Signed: 02/23/2017 12:17:48 PM By: Linton Ham MD Entered By: Linton Ham on 02/23/2017 12:17:48 Benjamin Soto ([id number]) -------------------------------------------------------------------------------- [place] Details Patient Name: Benjamin Soto Date of Service: 02/23/2017 Medical Record Number: [id number] Patient Account Number: [id number] Date of Birth/Sex: 11/10/1935 (81 y.o. 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

## 2017-02-24 NOTE — Progress Notes (Signed)
JYLES, SONTAG ([id number]) Visit Report for 02/23/2017 Arrival Information Details Patient Name: Benjamin Soto, Benjamin Soto Date of Service: 02/23/2017 11:00 AM Medical Record Number: [id number] Patient Account Number: [id number] Date of Birth/Sex: Sep 21, 1936 (81 y.o. Male) Treating RN: Montey Hora Primary Care Kiam Bransfield: Elsie Stain Other Clinician: Referring Addyson Traub: Elsie Stain Treating Gabriel Paulding/Extender: Tito Dine in Treatment: 2 Visit Information History Since Last Visit Added or deleted any medications: No Patient Arrived: Ambulatory Any new allergies or adverse reactions: No Arrival Time: 11:07 Had a fall or experienced change in No Accompanied By: spouse activities of daily living that may affect Transfer Assistance: None risk of falls: Patient Identification Verified: Yes Signs or symptoms of abuse/neglect since last No Secondary Verification Process Yes visito Completed: Hospitalized since last visit: No Patient Has Alerts: Yes Has Dressing in Place as Prescribed: Yes Patient Alerts: Patient on Blood Pain Present Now: Yes Thinner warfarin Type II Diabetic Electronic Signature(s) Signed: 02/23/2017 4:59:08 PM By: Montey Hora Entered By: Montey Hora on 02/23/2017 11:07:42 Benjamin Soto ([id number]) -------------------------------------------------------------------------------- Clinic Level of Care Assessment Details Patient Name: Benjamin Soto Date of Service: 02/23/2017 11:00 AM Medical Record Number: [id number] Patient Account Number: [id number] Date of Birth/Sex: June 13, 1936 (81 y.o. Male) Treating RN: Montey Hora Primary Care Barnaby Rippeon: Elsie Stain Other Clinician: Referring Saraann Enneking: Elsie Stain Treating Grabiel Schmutz/Extender: Tito Dine in Treatment: 2 Clinic Level of Care Assessment Items TOOL 4 Quantity Score []  - Use when only an EandM is performed on FOLLOW-UP visit 0 ASSESSMENTS - Nursing Assessment / Reassessment X  - Reassessment of Co-morbidities (includes updates in patient status) 1 10 X - Reassessment of Adherence to Treatment Plan 1 5 ASSESSMENTS - Wound and Skin Assessment / Reassessment X - Simple Wound Assessment / Reassessment - one wound 1 5 []  - Complex Wound Assessment / Reassessment - multiple wounds 0 []  - Dermatologic / Skin Assessment (not related to wound area) 0 ASSESSMENTS - Focused Assessment []  - Circumferential Edema Measurements - multi extremities 0 []  - Nutritional Assessment / Counseling / Intervention 0 X - Lower Extremity Assessment (monofilament, tuning fork, pulses) 1 5 []  - Peripheral Arterial Disease Assessment (using hand held doppler) 0 ASSESSMENTS - Ostomy and/or Continence Assessment and Care []  - Incontinence Assessment and Management 0 []  - Ostomy Care Assessment and Management (repouching, etc.) 0 PROCESS - Coordination of Care X - Simple Patient / Family Education for ongoing care 1 15 []  - Complex (extensive) Patient / Family Education for ongoing care 0 []  - Staff obtains Programmer, systems, Records, Test Results / Process Orders 0 []  - Staff telephones HHA, Nursing Homes / Clarify orders / etc 0 []  - Routine Transfer to another Facility (non-emergent condition) 0 Hollingworth, Amardeep ([id number]) []  - Routine Hospital Admission (non-emergent condition) 0 []  - New Admissions / Biomedical engineer / Ordering NPWT, Apligraf, etc. 0 []  - Emergency Hospital Admission (emergent condition) 0 X - Simple Discharge Coordination 1 10 []  - Complex (extensive) Discharge Coordination 0 PROCESS - Special Needs []  - Pediatric / Minor Patient Management 0 []  - Isolation Patient Management 0 []  - Hearing / Language / Visual special needs 0 []  - Assessment of Community assistance (transportation, D/C planning, etc.) 0 []  - Additional assistance / Altered mentation 0 []  - Support Surface(s) Assessment (bed, cushion, seat, etc.) 0 INTERVENTIONS - Wound Cleansing / Measurement X -  Simple Wound Cleansing - one wound 1 5 []  - Complex Wound Cleansing - multiple wounds 0 X - Wound Imaging (photographs - any number of wounds) 1 5 []  -  Wound Tracing (instead of photographs) 0 X - Simple Wound Measurement - one wound 1 5 []  - Complex Wound Measurement - multiple wounds 0 INTERVENTIONS - Wound Dressings X - Small Wound Dressing one or multiple wounds 1 10 []  - Medium Wound Dressing one or multiple wounds 0 []  - Large Wound Dressing one or multiple wounds 0 []  - Application of Medications - topical 0 []  - Application of Medications - injection 0 INTERVENTIONS - Miscellaneous []  - External ear exam 0 Blann, Nancy ([id number]) []  - Specimen Collection (cultures, biopsies, blood, body fluids, etc.) 0 []  - Specimen(s) / Culture(s) sent or taken to Lab for analysis 0 []  - Patient Transfer (multiple staff / Harrel Lemon Lift / Similar devices) 0 []  - Simple Staple / Suture removal (25 or less) 0 []  - Complex Staple / Suture removal (26 or more) 0 []  - Hypo / Hyperglycemic Management (close monitor of Blood Glucose) 0 []  - Ankle / Brachial Index (ABI) - do not check if billed separately 0 X - Vital Signs 1 5 Has the patient been seen at the hospital within the last three years: Yes Total Score: 80 Level Of Care: New/Established - Level 3 Electronic Signature(s) Signed: 02/23/2017 4:59:08 PM By: Montey Hora Entered By: Montey Hora on 02/23/2017 11:33:08 Benjamin Soto ([id number]) -------------------------------------------------------------------------------- Encounter Discharge Information Details Patient Name: Benjamin Soto Date of Service: 02/23/2017 11:00 AM Medical Record Number: [id number] Patient Account Number: [id number] Date of Birth/Sex: Jan 06, 1936 (81 y.o. Male) Treating RN: Montey Hora Primary Care Conway Fedora: Elsie Stain Other Clinician: Referring Virginio Isidore: Elsie Stain Treating Dashanique Brownstein/Extender: Tito Dine in Treatment: 2 Encounter  Discharge Information Items Discharge Pain Level: 0 Discharge Condition: Stable Ambulatory Status: Ambulatory Discharge Destination: Home Transportation: Private Auto Accompanied By: spouse Schedule Follow-up Appointment: Yes Medication Reconciliation completed and provided to Patient/Care No Tovah Slavick: Provided on Clinical Summary of Care: 02/23/2017 Form Type Recipient Paper Patient HD Electronic Signature(s) Signed: 02/23/2017 11:35:59 AM By: Sharon Mt Entered By: Sharon Mt on 02/23/2017 11:35:58 Benjamin Soto ([id number]) -------------------------------------------------------------------------------- Lower Extremity Assessment Details Patient Name: Benjamin Soto Date of Service: 02/23/2017 11:00 AM Medical Record Number: [id number] Patient Account Number: [id number] Date of Birth/Sex: 02/03/1936 (80 y.o. Male) Treating RN: Montey Hora Primary Care Kaelei Wheeler: Elsie Stain Other Clinician: Referring Lyrik Buresh: Elsie Stain Treating Felicitas Sine/Extender: Ricard Dillon Weeks in Treatment: 2 Vascular Assessment Pulses: Dorsalis Pedis Palpable: [Left:Yes] Posterior Tibial Extremity colors, hair growth, and conditions: Extremity Color: [Left:Normal] Hair Growth on Extremity: [Left:No] Temperature of Extremity: [Left:Warm] Capillary Refill: [Left:< 3 seconds] Electronic Signature(s) Signed: 02/23/2017 4:59:08 PM By: Montey Hora Entered By: Montey Hora on 02/23/2017 11:24:52 Benjamin Soto ([id number]) -------------------------------------------------------------------------------- Multi Wound Chart Details Patient Name: Benjamin Soto Date of Service: 02/23/2017 11:00 AM Medical Record Number: [id number] Patient Account Number: [id number] Date of Birth/Sex: [birth year] (81 y.o. Male) Treating RN: Montey Hora Primary Care Cathe Bilger: Elsie Stain Other Clinician: Referring Asael Pann: Elsie Stain Treating Lucillia Corson/Extender: Ricard Dillon Weeks in  Treatment: 2 Vital Signs Height(in): 69 Pulse(bpm): 66 Weight(lbs): 185 Blood Pressure 138/63 (mmHg): Body Mass Index(BMI): 27 Temperature(F): 98.2 Respiratory Rate 18 (breaths/min): Photos: [1:No Photos] [N/A:N/A] Wound Location: [1:Left Lower Leg - Midline] [N/A:N/A] Wounding Event: [1:Trauma] [N/A:N/A] Primary Etiology: [1:Trauma, Other] [N/A:N/A] Comorbid History: [1:Glaucoma, Chronic Obstructive Pulmonary Disease (COPD), Arrhythmia, Coronary Artery Disease, Hypertension, Type II Diabetes, Osteoarthritis, Neuropathy] [N/A:N/A] Date Acquired: [1:01/23/2017] [N/A:N/A] Weeks of Treatment: [1:2] [N/A:N/A] Wound Status: [1:Open] [N/A:N/A] Measurements L x W x D 4.2x2.1x0.3 [N/A:N/A] (cm) Area (cm) : [1:6.927] [N/A:N/A] Volume (cm) : [1:2.078] [N/A:N/A] %  Reduction in Area: [1:-37.80%] [N/A:N/A] % Reduction in Volume: -313.10% [N/A:N/A] Classification: [1:Full Thickness Without Exposed Support Structures] [N/A:N/A] HBO Classification: [1:Grade 1] [N/A:N/A] Exudate Amount: [1:Medium] [N/A:N/A] Exudate Type: [1:Serosanguineous] [N/A:N/A] Exudate Color: [1:red, brown] [N/A:N/A] Wound Margin: [1:Flat and Intact] [N/A:N/A] Granulation Amount: [1:Large (67-100%)] [N/A:N/A] Granulation Quality: Red N/A N/A Necrotic Amount: Small (1-33%) N/A N/A Necrotic Tissue: Eschar, Adherent Slough N/A N/A Exposed Structures: Fat Layer (Subcutaneous N/A N/A Tissue) Exposed: Yes Fascia: No Tendon: No Muscle: No Joint: No Bone: No Epithelialization: None N/A N/A Periwound Skin Texture: Excoriation: No N/A N/A Induration: No Callus: No Crepitus: No Rash: No Scarring: No Periwound Skin Maceration: No N/A N/A Moisture: Dry/Scaly: No Periwound Skin Color: Ecchymosis: Yes N/A N/A Atrophie Blanche: No Cyanosis: No Erythema: No Hemosiderin Staining: No Mottled: No Pallor: No Rubor: No Temperature: No Abnormality N/A N/A Tenderness on Yes N/A N/A Palpation: Wound  Preparation: Ulcer Cleansing: N/A N/A Rinsed/Irrigated with Saline Topical Anesthetic Applied: Other: lidociane 4% Treatment Notes Electronic Signature(s) Signed: 02/23/2017 4:59:08 PM By: Montey Hora Entered By: Montey Hora on 02/23/2017 11:25:29 Benjamin Soto ([id number]) -------------------------------------------------------------------------------- [place] Details Patient Name: Benjamin Soto Date of Service: 02/23/2017 11:00 AM Medical Record Number: [id number] Patient Account Number: [id number] Date of Birth/Sex: 1935/10/01 (81 y.o. Male) Treating RN: Montey Hora Primary Care Taquilla Downum: Elsie Stain Other Clinician: Referring Jashaun Penrose: Elsie Stain Treating Allice Garro/Extender: Tito Dine in Treatment: 2 Active Inactive ` Abuse / Safety / Falls / Self Care Management Nursing Diagnoses: Potential for falls Goals: Patient will not experience any injury related to falls Date Initiated: 02/09/2017 Target Resolution Date: 04/23/2017 Goal Status: Active Interventions: Assess fall risk on admission and as needed Notes: ` Orientation to the Wound Care Program Nursing Diagnoses: Knowledge deficit related to the wound healing center program Goals: Patient/caregiver will verbalize understanding of the [facility] Program Date Initiated: 02/09/2017 Target Resolution Date: 04/23/2017 Goal Status: Active Interventions: Provide education on orientation to the wound center Notes: ` Wound/Skin Impairment Nursing Diagnoses: Impaired tissue integrity KOSTON, HENNES ([id number]) Goals: Patient/caregiver will verbalize understanding of skin care regimen Date Initiated: 02/09/2017 Target Resolution Date: 04/23/2017 Goal Status: Active Ulcer/skin breakdown will have a volume reduction of 30% by week 4 Date Initiated: 02/09/2017 Target Resolution Date: 04/23/2017 Goal Status: Active Ulcer/skin breakdown will have a volume reduction of  50% by week 8 Date Initiated: 02/09/2017 Target Resolution Date: 04/23/2017 Goal Status: Active Ulcer/skin breakdown will have a volume reduction of 80% by week 12 Date Initiated: 02/09/2017 Target Resolution Date: 04/23/2017 Goal Status: Active Ulcer/skin breakdown will heal within 14 weeks Date Initiated: 02/09/2017 Target Resolution Date: 04/23/2017 Goal Status: Active Interventions: Assess patient/caregiver ability to obtain necessary supplies Assess patient/caregiver ability to perform ulcer/skin care regimen upon admission and as needed Assess ulceration(s) every visit Notes: Electronic Signature(s) Signed: 02/23/2017 4:59:08 PM By: Montey Hora Entered By: Montey Hora on 02/23/2017 11:24:58 Benjamin Soto ([id number]) -------------------------------------------------------------------------------- Pain Assessment Details Patient Name: Benjamin Soto Date of Service: 02/23/2017 11:00 AM Medical Record Number: [id number] Patient Account Number: [id number] Date of Birth/Sex: Oct 20, 1935 (81 y.o. Male) Treating RN: Montey Hora Primary Care Mukhtar Shams: Elsie Stain Other Clinician: Referring Aishah Teffeteller: Elsie Stain Treating Ailene Royal/Extender: Ricard Dillon Weeks in Treatment: 2 Active Problems Location of Pain Severity and Description of Pain Patient Has Paino Yes Site Locations Pain Location: Pain in Ulcers With Dressing Change: Yes Duration of the Pain. Constant / Intermittento Constant Pain Management and Medication Current Pain Management: Notes Topical or injectable lidocaine is offered to patient for acute  pain when surgical debridement is performed. If needed, Patient is instructed to use over the counter pain medication for the following 24-48 hours after debridement. Wound care MDs do not prescribed pain medications. Patient has chronic pain or uncontrolled pain. Patient has been instructed to make an appointment with their Primary Care Physician for pain  management. Electronic Signature(s) Signed: 02/23/2017 4:59:08 PM By: Montey Hora Entered By: Montey Hora on 02/23/2017 11:07:55 Benjamin Soto ([id number]) -------------------------------------------------------------------------------- Patient/Caregiver Education Details Patient Name: Benjamin Soto Date of Service: 02/23/2017 11:00 AM Medical Record Number: [id number] Patient Account Number: [id number] Date of Birth/Gender: May 08, 1936 (81 y.o. Male) Treating RN: Montey Hora Primary Care Physician: Elsie Stain Other Clinician: Referring Physician: Elsie Stain Treating Physician/Extender: Tito Dine in Treatment: 2 Education Assessment Education Provided To: Patient and Caregiver Education Topics Provided Wound/Skin Impairment: Handouts: Other: wound care as ordered Methods: Demonstration, Explain/Verbal Responses: State content correctly Electronic Signature(s) Signed: 02/23/2017 4:59:08 PM By: Montey Hora Entered By: Montey Hora on 02/23/2017 11:27:20 Benjamin Soto ([id number]) -------------------------------------------------------------------------------- Wound Assessment Details Patient Name: Benjamin Soto Date of Service: 02/23/2017 11:00 AM Medical Record Number: [id number] Patient Account Number: [id number] Date of Birth/Sex: Aug 06, 1936 (81 y.o. Male) Treating RN: Montey Hora Primary Care Murel Wigle: Elsie Stain Other Clinician: Referring Quartez Lagos: Elsie Stain Treating Fionna Merriott/Extender: Ricard Dillon Weeks in Treatment: 2 Wound Status Wound Number: 1 Primary Trauma, Other Etiology: Wound Location: Left Lower Leg - Midline Wound Open Wounding Event: Trauma Status: Date Acquired: 01/23/2017 Comorbid Glaucoma, Chronic Obstructive Weeks Of Treatment: 2 History: Pulmonary Disease (COPD), Arrhythmia, Clustered Wound: No Coronary Artery Disease, Hypertension, Type II Diabetes, Osteoarthritis, Neuropathy Photos Photo  Uploaded By: Montey Hora on 02/23/2017 11:51:15 Wound Measurements Length: (cm) 4.2 Width: (cm) 2.1 Depth: (cm) 0.3 Area: (cm) 6.927 Volume: (cm) 2.078 % Reduction in Area: -37.8% % Reduction in Volume: -313.1% Epithelialization: None Tunneling: No Undermining: No Wound Description Full Thickness Without Exposed Foul Odor A Classification: Support Structures Slough/Fibr Diabetic Severity Grade 1 (Wagner): Wound Margin: Flat and Intact Exudate Amount: Medium Exudate Type: Serosanguineous Exudate Color: red, brown RUSHTON, EARLY ([id number]) fter Cleansing: No ino Yes Wound Bed Granulation Amount: Large (67-100%) Exposed Structure Granulation Quality: Red Fascia Exposed: No Necrotic Amount: Small (1-33%) Fat Layer (Subcutaneous Tissue) Exposed: Yes Necrotic Quality: Eschar, Adherent Slough Tendon Exposed: No Muscle Exposed: No Joint Exposed: No Bone Exposed: No Periwound Skin Texture Texture Color No Abnormalities Noted: No No Abnormalities Noted: No Callus: No Atrophie Blanche: No Crepitus: No Cyanosis: No Excoriation: No Ecchymosis: Yes Induration: No Erythema: No Rash: No Hemosiderin Staining: No Scarring: No Mottled: No Pallor: No Moisture Rubor: No No Abnormalities Noted: No Dry / Scaly: No Temperature / Pain Maceration: No Temperature: No Abnormality Tenderness on Palpation: Yes Wound Preparation Ulcer Cleansing: Rinsed/Irrigated with Saline Topical Anesthetic Applied: Other: lidociane 4%, Treatment Notes Wound #1 (Left, Midline Lower Leg) 1. Cleansed with: Clean wound with Normal Saline 2. Anesthetic Topical Lidocaine 4% cream to wound bed prior to debridement 4. Dressing Applied: Aquacel Ag 5. Secondary Dressing Applied Dry [place] Signature(s) Signed: 02/23/2017 4:59:08 PM By: Montey Hora Entered By: Montey Hora on 02/23/2017 11:13:35 Benjamin Soto  ([id number]) -------------------------------------------------------------------------------- [place] Details Patient Name: Benjamin Soto Date of Service: 02/23/2017 11:00 AM Medical Record Number: [id number] Patient Account Number: [id number] Date of Birth/Sex: [birth year] (81 y.o. Male) Treating RN: Montey Hora Primary Care Hardin Hardenbrook: Elsie Stain Other Clinician: Referring Karinna Beadles: Elsie Stain Treating Allyah Heather/Extender: Ricard Dillon Weeks in Treatment: 2 Vital Signs Time Taken: 11:08 Temperature (F): 98.2  Height (in): 69 Pulse (bpm): 66 Weight (lbs): 185 Respiratory Rate (breaths/min): 18 Body Mass Index (BMI): 27.3 Blood Pressure (mmHg): 138/63 Reference Range: 80 - 120 mg / dl Electronic Signature(s) Signed: 02/23/2017 4:59:08 PM By: Montey Hora Entered By: Montey Hora on 02/23/2017 11:10:31

## 2017-03-02 ENCOUNTER — Encounter: Payer: Medicare Other | Admitting: Internal Medicine

## 2017-03-02 DIAGNOSIS — E1122 Type 2 diabetes mellitus with diabetic chronic kidney disease: Secondary | ICD-10-CM | POA: Diagnosis not present

## 2017-03-02 DIAGNOSIS — H409 Unspecified glaucoma: Secondary | ICD-10-CM | POA: Diagnosis not present

## 2017-03-02 DIAGNOSIS — N189 Chronic kidney disease, unspecified: Secondary | ICD-10-CM | POA: Diagnosis not present

## 2017-03-02 DIAGNOSIS — L97223 Non-pressure chronic ulcer of left calf with necrosis of muscle: Secondary | ICD-10-CM | POA: Diagnosis not present

## 2017-03-02 DIAGNOSIS — I130 Hypertensive heart and chronic kidney disease with heart failure and stage 1 through stage 4 chronic kidney disease, or unspecified chronic kidney disease: Secondary | ICD-10-CM | POA: Diagnosis not present

## 2017-03-02 DIAGNOSIS — S81812D Laceration without foreign body, left lower leg, subsequent encounter: Secondary | ICD-10-CM | POA: Diagnosis not present

## 2017-03-02 DIAGNOSIS — I4891 Unspecified atrial fibrillation: Secondary | ICD-10-CM | POA: Diagnosis not present

## 2017-03-02 DIAGNOSIS — I509 Heart failure, unspecified: Secondary | ICD-10-CM | POA: Diagnosis not present

## 2017-03-02 DIAGNOSIS — S81802A Unspecified open wound, left lower leg, initial encounter: Secondary | ICD-10-CM | POA: Diagnosis not present

## 2017-03-02 DIAGNOSIS — Z7901 Long term (current) use of anticoagulants: Secondary | ICD-10-CM | POA: Diagnosis not present

## 2017-03-02 DIAGNOSIS — J449 Chronic obstructive pulmonary disease, unspecified: Secondary | ICD-10-CM | POA: Diagnosis not present

## 2017-03-02 DIAGNOSIS — M1990 Unspecified osteoarthritis, unspecified site: Secondary | ICD-10-CM | POA: Diagnosis not present

## 2017-03-02 DIAGNOSIS — E11610 Type 2 diabetes mellitus with diabetic neuropathic arthropathy: Secondary | ICD-10-CM | POA: Diagnosis not present

## 2017-03-03 NOTE — Progress Notes (Signed)
UZZIAH, RIGG ([id number]) Visit Report for 03/02/2017 Arrival Information Details Patient Name: Benjamin Soto, Benjamin Soto Date of Service: 03/02/2017 1:30 PM Medical Record Number: [id number] Patient Account Number: [id number] Date of Birth/Sex: [birth year] (81 y.o. Male) Treating RN: Montey Hora Primary Care Lenzi Marmo: Elsie Stain Other Clinician: Referring Maccoy Haubner: Elsie Stain Treating Nana Vastine/Extender: Tito Dine in Treatment: 3 Visit Information History Since Last Visit Added or deleted any medications: No Patient Arrived: Ambulatory Any new allergies or adverse reactions: No Arrival Time: 13:32 Had a fall or experienced change in No Accompanied By: SPOUSE activities of daily living that may affect Transfer Assistance: None risk of falls: Patient Identification Verified: Yes Signs or symptoms of abuse/neglect since last No Secondary Verification Process Yes visito Completed: Hospitalized since last visit: No Patient Has Alerts: Yes Has Dressing in Place as Prescribed: Yes Patient Alerts: Patient on Blood Pain Present Now: No Thinner warfarin Type II Diabetic Electronic Signature(s) Signed: 03/02/2017 5:15:33 PM By: Montey Hora Entered By: Montey Hora on 03/02/2017 13:32:36 Benjamin Soto ([id number]) -------------------------------------------------------------------------------- Encounter Discharge Information Details Patient Name: Benjamin Soto Date of Service: 03/02/2017 1:30 PM Medical Record Number: [id number] Patient Account Number: [id number] Date of Birth/Sex: 1936/01/20 (81 y.o. Male) Treating RN: Montey Hora Primary Care Rica Heather: Elsie Stain Other Clinician: Referring Aleina Burgio: Elsie Stain Treating Kadeem Hyle/Extender: Tito Dine in Treatment: 3 Encounter Discharge Information Items Discharge Pain Level: 0 Discharge Condition: Stable Ambulatory Status: Ambulatory Discharge Destination: Home Transportation:  Private Auto Accompanied By: spouse Schedule Follow-up Appointment: Yes Medication Reconciliation completed and provided to Patient/Care No Jamiaya Bina: Provided on Clinical Summary of Care: 03/02/2017 Form Type Recipient Paper Patient HD Electronic Signature(s) Signed: 03/02/2017 2:17:04 PM By: Ruthine Dose Entered By: Ruthine Dose on 03/02/2017 14:17:03 Benjamin Soto ([id number]) -------------------------------------------------------------------------------- Lower Extremity Assessment Details Patient Name: Benjamin Soto Date of Service: 03/02/2017 1:30 PM Medical Record Number: [id number] Patient Account Number: [id number] Date of Birth/Sex: Jul 03, 1936 (81 y.o. Male) Treating RN: Montey Hora Primary Care Sherald Balbuena: Elsie Stain Other Clinician: Referring Syra Sirmons: Elsie Stain Treating Demyan Fugate/Extender: Ricard Dillon Weeks in Treatment: 3 Vascular Assessment Pulses: Dorsalis Pedis Palpable: [Left:Yes] Posterior Tibial Extremity colors, hair growth, and conditions: Extremity Color: [Left:Normal] Hair Growth on Extremity: [Left:Yes] Temperature of Extremity: [Left:Warm] Capillary Refill: [Left:< 3 seconds] Electronic Signature(s) Signed: 03/02/2017 5:15:33 PM By: Montey Hora Entered By: Montey Hora on 03/02/2017 14:04:35 Benjamin Soto ([id number]) -------------------------------------------------------------------------------- Multi Wound Chart Details Patient Name: Benjamin Soto Date of Service: 03/02/2017 1:30 PM Medical Record Number: [id number] Patient Account Number: [id number] Date of Birth/Sex: [birth year] (81 y.o. Male) Treating RN: Montey Hora Primary Care Davon Folta: Elsie Stain Other Clinician: Referring Dannon Perlow: Elsie Stain Treating Lashun Mccants/Extender: Ricard Dillon Weeks in Treatment: 3 Vital Signs Height(in): 69 Pulse(bpm): 71 Weight(lbs): 185 Blood Pressure 135/71 (mmHg): Body Mass Index(BMI): 27 Temperature(F):  98.4 Respiratory Rate 18 (breaths/min): Photos: [1:No Photos] [N/A:N/A] Wound Location: [1:Left Lower Leg - Midline] [N/A:N/A] Wounding Event: [1:Trauma] [N/A:N/A] Primary Etiology: [1:Trauma, Other] [N/A:N/A] Comorbid History: [1:Glaucoma, Chronic Obstructive Pulmonary Disease (COPD), Arrhythmia, Coronary Artery Disease, Hypertension, Type II Diabetes, Osteoarthritis, Neuropathy] [N/A:N/A] Date Acquired: [1:01/23/2017] [N/A:N/A] Weeks of Treatment: [1:3] [N/A:N/A] Wound Status: [1:Open] [N/A:N/A] Measurements L x W x D 4.4x2x0.2 [N/A:N/A] (cm) Area (cm) : [1:6.912] [N/A:N/A] Volume (cm) : [1:1.382] [N/A:N/A] % Reduction in Area: [1:-37.50%] [N/A:N/A] % Reduction in Volume: -174.80% [N/A:N/A] Classification: [1:Full Thickness Without Exposed Support Structures] [N/A:N/A] HBO Classification: [1:Grade 1] [N/A:N/A] Exudate Amount: [1:Large] [N/A:N/A] Exudate Type: [1:Serous] [N/A:N/A] Exudate Color: [1:amber] [N/A:N/A] Wound Margin: [1:Flat and Intact] [N/A:N/A] Granulation Amount: [1:Large (67-100%)] [N/A:N/A]  Granulation Quality: Red N/A N/A Necrotic Amount: Small (1-33%) N/A N/A Necrotic Tissue: Eschar, Adherent Slough N/A N/A Exposed Structures: Fat Layer (Subcutaneous N/A N/A Tissue) Exposed: Yes Fascia: No Tendon: No Muscle: No Joint: No Bone: No Epithelialization: None N/A N/A Debridement: Debridement (32440- N/A N/A 11047) Pre-procedure 14:02 N/A N/A Verification/Time Out Taken: Pain Control: Lidocaine 4% Topical N/A N/A Solution Tissue Debrided: Necrotic/Eschar, N/A N/A Fibrin/Slough, Subcutaneous Level: Skin/Subcutaneous N/A N/A Tissue Debridement Area (sq 8.8 N/A N/A cm): Instrument: Curette N/A N/A Bleeding: Moderate N/A N/A Hemostasis Achieved: Pressure N/A N/A Procedural Pain: 0 N/A N/A Post Procedural Pain: 0 N/A N/A Debridement Treatment Procedure was tolerated N/A N/A Response: well Post Debridement 4.4x2x0.3 N/A N/A Measurements L x W x  D (cm) Post Debridement 2.073 N/A N/A Volume: (cm) Periwound Skin Texture: Excoriation: No N/A N/A Induration: No Callus: No Crepitus: No Rash: No Scarring: No Periwound Skin Maceration: No N/A N/A Moisture: Dry/Scaly: No Periwound Skin Color: Ecchymosis: Yes N/A N/A Atrophie Blanche: No Cyanosis: No Erythema: No Hemosiderin Staining: No Benjamin Soto, Benjamin Soto ([id number]) Mottled: No Pallor: No Rubor: No Temperature: No Abnormality N/A N/A Tenderness on Yes N/A N/A Palpation: Wound Preparation: Ulcer Cleansing: N/A N/A Rinsed/Irrigated with Saline Topical Anesthetic Applied: Other: lidociane 4% Procedures Performed: Debridement N/A N/A Treatment Notes Wound #1 (Left, Midline Lower Leg) 1. Cleansed with: Clean wound with Normal Saline 2. Anesthetic Topical Lidocaine 4% cream to wound bed prior to debridement 4. Dressing Applied: Hydrafera Blue 5. Secondary Dressing Applied Dry [place] Signature(s) Signed: 03/02/2017 6:01:27 PM By: Linton Ham MD Entered By: Linton Ham on 03/02/2017 15:50:03 Benjamin Soto ([id number]) -------------------------------------------------------------------------------- Multi-Disciplinary Care Plan Details Patient Name: Benjamin Soto Date of Service: 03/02/2017 1:30 PM Medical Record Number: [id number] Patient Account Number: [id number] Date of Birth/Sex: August 06, 1936 (81 y.o. Male) Treating RN: Montey Hora Primary Care Christene Pounds: Elsie Stain Other Clinician: Referring Watt Geiler: Elsie Stain Treating Xzaiver Vayda/Extender: Tito Dine in Treatment: 3 Active Inactive ` Abuse / Safety / Falls / Self Care Management Nursing Diagnoses: Potential for falls Goals: Patient will not experience any injury related to falls Date Initiated: 02/09/2017 Target Resolution Date: 04/23/2017 Goal Status: Active Interventions: Assess fall risk on admission and as needed Notes: ` Orientation to the  Wound Care Program Nursing Diagnoses: Knowledge deficit related to the wound healing center program Goals: Patient/caregiver will verbalize understanding of the [place] Program Date Initiated: 02/09/2017 Target Resolution Date: 04/23/2017 Goal Status: Active Interventions: Provide education on orientation to the wound center Notes: ` Wound/Skin Impairment Nursing Diagnoses: Impaired tissue integrity Benjamin Soto, Benjamin Soto ([id number]) Goals: Patient/caregiver will verbalize understanding of skin care regimen Date Initiated: 02/09/2017 Target Resolution Date: 04/23/2017 Goal Status: Active Ulcer/skin breakdown will have a volume reduction of 30% by week 4 Date Initiated: 02/09/2017 Target Resolution Date: 04/23/2017 Goal Status: Active Ulcer/skin breakdown will have a volume reduction of 50% by week 8 Date Initiated: 02/09/2017 Target Resolution Date: 04/23/2017 Goal Status: Active Ulcer/skin breakdown will have a volume reduction of 80% by week 12 Date Initiated: 02/09/2017 Target Resolution Date: 04/23/2017 Goal Status: Active Ulcer/skin breakdown will heal within 14 weeks Date Initiated: 02/09/2017 Target Resolution Date: 04/23/2017 Goal Status: Active Interventions: Assess patient/caregiver ability to obtain necessary supplies Assess patient/caregiver ability to perform ulcer/skin care regimen upon admission and as needed Assess ulceration(s) every visit Notes: Electronic Signature(s) Signed: 03/02/2017 5:15:33 PM By: Montey Hora Entered By: Montey Hora on 03/02/2017 14:04:47 Benjamin Soto ([id number]) -------------------------------------------------------------------------------- Pain Assessment Details Patient Name: Benjamin Soto Date of Service: 03/02/2017  1:30 PM Medical Record Number: [id number] Patient Account Number: [id number] Date of Birth/Sex: 1936-03-08 (81 y.o. Male) Treating RN: Montey Hora Primary Care Izumi Mixon: Elsie Stain Other  Clinician: Referring Shloma Roggenkamp: Elsie Stain Treating Devontay Celaya/Extender: Tito Dine in Treatment: 3 Active Problems Location of Pain Severity and Description of Pain Patient Has Paino No Site Locations Pain Management and Medication Current Pain Management: Notes hurts only in the evening - Topical or injectable lidocaine is offered to patient for acute pain when surgical debridement is performed. If needed, Patient is instructed to use over the counter pain medication for the following 24-48 hours after debridement. Wound care MDs do not prescribed pain medications. Patient has chronic pain or uncontrolled pain. Patient has been instructed to make an appointment with their Primary Care Physician for pain management. Electronic Signature(s) Signed: 03/02/2017 5:15:33 PM By: Montey Hora Entered By: Montey Hora on 03/02/2017 13:34:45 Benjamin Soto ([id number]) -------------------------------------------------------------------------------- Patient/Caregiver Education Details Patient Name: Benjamin Soto Date of Service: 03/02/2017 1:30 PM Medical Record Number: [id number] Patient Account Number: [id number] Date of Birth/Gender: 1935-09-24 (81 y.o. Male) Treating RN: Montey Hora Primary Care Physician: Elsie Stain Other Clinician: Referring Physician: Elsie Stain Treating Physician/Extender: Tito Dine in Treatment: 3 Education Assessment Education Provided To: Patient and Caregiver Education Topics Provided Wound/Skin Impairment: Handouts: Other: wound care as ordered Methods: Demonstration, Explain/Verbal Responses: State content correctly Electronic Signature(s) Signed: 03/02/2017 5:15:33 PM By: Montey Hora Entered By: Montey Hora on 03/02/2017 14:08:22 Benjamin Soto ([id number]) -------------------------------------------------------------------------------- Wound Assessment Details Patient Name: Benjamin Soto Date of  Service: 03/02/2017 1:30 PM Medical Record Number: [id number] Patient Account Number: [id number] Date of Birth/Sex: 04-Oct-1935 (81 y.o. Male) Treating RN: Montey Hora Primary Care Austine Kelsay: Elsie Stain Other Clinician: Referring Archita Lomeli: Elsie Stain Treating Marcos Ruelas/Extender: Ricard Dillon Weeks in Treatment: 3 Wound Status Wound Number: 1 Primary Trauma, Other Etiology: Wound Location: Left Lower Leg - Midline Wound Open Wounding Event: Trauma Status: Date Acquired: 01/23/2017 Comorbid Glaucoma, Chronic Obstructive Weeks Of Treatment: 3 History: Pulmonary Disease (COPD), Arrhythmia, Clustered Wound: No Coronary Artery Disease, Hypertension, Type II Diabetes, Osteoarthritis, Neuropathy Wound Measurements Length: (cm) 4.4 Width: (cm) 2 Depth: (cm) 0.2 Area: (cm) 6.912 Volume: (cm) 1.382 % Reduction in Area: -37.5% % Reduction in Volume: -174.8% Epithelialization: None Tunneling: No Undermining: No Wound Description Full Thickness Without Foul Odor Classification: Exposed Support Structures Slough/Fib Diabetic Severity Grade 1 (Wagner): Wound Margin: Flat and Intact Exudate Amount: Large Exudate Type: Serous Exudate Color: amber After Cleansing: No rino Yes Wound Bed Granulation Amount: Large (67-100%) Exposed Structure Granulation Quality: Red Fascia Exposed: No Necrotic Amount: Small (1-33%) Fat Layer (Subcutaneous Tissue) Exposed: Yes Necrotic Quality: Eschar, Adherent Slough Tendon Exposed: No Muscle Exposed: No Joint Exposed: No Bone Exposed: No Periwound Skin Texture Texture Color No Abnormalities Noted: No No Abnormalities Noted: No Benjamin Soto, Benjamin Soto ([id number]) Callus: No Atrophie Blanche: No Crepitus: No Cyanosis: No Excoriation: No Ecchymosis: Yes Induration: No Erythema: No Rash: No Hemosiderin Staining: No Scarring: No Mottled: No Pallor: No Moisture Rubor: No No Abnormalities Noted: No Dry / Scaly: No Temperature /  Pain Maceration: No Temperature: No Abnormality Tenderness on Palpation: Yes Wound Preparation Ulcer Cleansing: Rinsed/Irrigated with Saline Topical Anesthetic Applied: Other: lidociane 4%, Treatment Notes Wound #1 (Left, Midline Lower Leg) 1. Cleansed with: Clean wound with Normal Saline 2. Anesthetic Topical Lidocaine 4% cream to wound bed prior to debridement 4. Dressing Applied: Hydrafera Blue 5. Secondary Dressing Applied Dry [place] Signature(s) Signed: 03/02/2017 5:15:33 PM By: Montey Hora  Entered By: Montey Hora on 03/02/2017 13:39:19 Benjamin Soto ([id number]) -------------------------------------------------------------------------------- [place] Details Patient Name: Benjamin Soto Date of Service: 03/02/2017 1:30 PM Medical Record Number: [id number] Patient Account Number: [id number] Date of Birth/Sex: 03/10/1936 (81 y.o. Male) Treating RN: Montey Hora Primary Care Teancum Brule: Elsie Stain Other Clinician: Referring Banks Chaikin: Elsie Stain Treating Nathanyel Defenbaugh/Extender: Tito Dine in Treatment: 3 Vital Signs Time Taken: 13:36 Temperature (F): 98.4 Height (in): 69 Pulse (bpm): 71 Weight (lbs): 185 Respiratory Rate (breaths/min): 18 Body Mass Index (BMI): 27.3 Blood Pressure (mmHg): 135/71 Reference Range: 80 - 120 mg / dl Electronic Signature(s) Signed: 03/02/2017 5:15:33 PM By: Montey Hora Entered By: Montey Hora on 03/02/2017 13:36:43

## 2017-03-03 NOTE — Progress Notes (Signed)
HILMAN, KISSLING ([id number]) Visit Report for 03/02/2017 Chief Complaint Document Details Patient Name: Benjamin Soto, Benjamin Soto Date of Service: 03/02/2017 1:30 PM Medical Record Number: [id number] Patient Account Number: [id number] Date of Birth/Sex: 12-Aug-1936 (81 y.o. Male) Treating RN: Montey Hora Primary Care Provider: Elsie Stain Other Clinician: Referring Provider: Elsie Stain Treating Provider/Extender: Tito Dine in Treatment: 3 Information Obtained from: Patient Chief Complaint 02/09/17; Patient is here for review of a traumatic wound on the left leg anteriory Electronic Signature(s) Signed: 03/02/2017 6:01:27 PM By: Linton Ham MD Entered By: Linton Ham on 03/02/2017 15:50:21 Benjamin Soto ([id number]) -------------------------------------------------------------------------------- Debridement Details Patient Name: Benjamin Soto Date of Service: 03/02/2017 1:30 PM Medical Record Number: [id number] Patient Account Number: [id number] Date of Birth/Sex: 12-10-1935 (81 y.o. Male) Treating RN: Montey Hora Primary Care Provider: Elsie Stain Other Clinician: Referring Provider: Elsie Stain Treating Provider/Extender: Tito Dine in Treatment: 3 Debridement Performed for Wound #1 Left,Midline Lower Leg Assessment: Performed By: Physician Ricard Dillon, MD Debridement: Debridement Pre-procedure Verification/Time Out Yes - 14:02 Taken: Start Time: 14:02 Pain Control: Lidocaine 4% Topical Solution Level: Skin/Subcutaneous Tissue Total Area Debrided (L x 4.4 (cm) x 2 (cm) = 8.8 (cm) W): Tissue and other Viable, Non-Viable, Eschar, Fibrin/Slough, Subcutaneous material debrided: Instrument: Curette Bleeding: Moderate Hemostasis Achieved: Pressure End Time: 14:06 Procedural Pain: 0 Post Procedural Pain: 0 Response to Treatment: Procedure was tolerated well Post Debridement Measurements of Total Wound Length: (cm)  4.4 Width: (cm) 2 Depth: (cm) 0.3 Volume: (cm) 2.073 Character of Wound/Ulcer Post Improved Debridement: Post Procedure Diagnosis Same as Pre-procedure Electronic Signature(s) Signed: 03/02/2017 5:15:33 PM By: Montey Hora Signed: 03/02/2017 6:01:27 PM By: Linton Ham MD Entered By: Linton Ham on 03/02/2017 15:50:12 Benjamin Soto ([id number]) -------------------------------------------------------------------------------- HPI Details Patient Name: Benjamin Soto Date of Service: 03/02/2017 1:30 PM Medical Record Number: [id number] Patient Account Number: [id number] Date of Birth/Sex: [birth year] (81 y.o. Male) Treating RN: Montey Hora Primary Care Provider: Elsie Stain Other Clinician: Referring Provider: Elsie Stain Treating Provider/Extender: Ricard Dillon Weeks in Treatment: 3 History of Present Illness HPI Description: 02/09/17; 81 year old man with a hx of type 2 DM, smoker. He suffered a fall 17 days ago while working on the [employer] of his trailer. He didn't realized anything had happed until he returned home. Has been applying polysporin. Received Cephalexin finishes on Saturday. No prior wound history. ABI's 0.96 on the left. No known hx of PAD. Hx of Afib on Coumadin,chf,st 2 crf 02/16/17; patient arrives today with the condition of his wound improved he is on silver alginate. Culture of the divot medially last week grew 2 different types of enterococcus including Enterococcus faecalis. He is on Augmentin. Surface of the wound looks better. Still debridement required today 02/23/17; traumatic wound on the left anterior leg. He has finished Augmentin. He found debridement last week painful fortunately none was required today 03/02/17; traumatic wound on the left anterior leg. Still 75% necrotic surface material requiring debridement. Most of the rest that it is stable Electronic Signature(s) Signed: 03/02/2017 6:01:27 PM By: Linton Ham MD Entered By:  Linton Ham on 03/02/2017 15:51:31 Benjamin Soto ([id number]) -------------------------------------------------------------------------------- Physical Exam Details Patient Name: Benjamin Soto Date of Service: 03/02/2017 1:30 PM Medical Record Number: [id number] Patient Account Number: [id number] Date of Birth/Sex: October 05, 1935 (81 y.o. Male) Treating RN: Montey Hora Primary Care Provider: Elsie Stain Other Clinician: Referring Provider: Elsie Stain Treating Provider/Extender: Ricard Dillon Weeks in Treatment: 3 Constitutional Sitting or standing Blood Pressure is within target range for patient.Marland Kitchen  Pulse regular and within target range for patient.Marland Kitchen Respirations regular, non-labored and within target range.. Temperature is normal and within the target range for the patient.Marland Kitchen appears in no distress. Notes Wound exam; using a #3 curet the bride meant of surface slough and nonviable subcutaneous tissue. The wound cleans up quite nicely. The medial Devitt is better. No evidence of surrounding infection Electronic Signature(s) Signed: 03/02/2017 6:01:27 PM By: Linton Ham MD Entered By: Linton Ham on 03/02/2017 15:52:13 Benjamin Soto ([id number]) -------------------------------------------------------------------------------- Physician Orders Details Patient Name: Benjamin Soto Date of Service: 03/02/2017 1:30 PM Medical Record Number: [id number] Patient Account Number: [id number] Date of Birth/Sex: 11/15/1935 (81 y.o. Male) Treating RN: Montey Hora Primary Care Provider: Elsie Stain Other Clinician: Referring Provider: Elsie Stain Treating Provider/Extender: Tito Dine in Treatment: 3 Verbal / Phone Orders: No Diagnosis Coding Wound Cleansing Wound #1 Left,Midline Lower Leg o Clean wound with Normal Saline. o May Shower, gently pat wound dry prior to applying new dressing. Anesthetic Wound #1 Left,Midline Lower Leg o Topical  Lidocaine 4% cream applied to wound bed prior to debridement Primary Wound Dressing Wound #1 Left,Midline Lower Leg o Hydrafera Blue Secondary Dressing Wound #1 Left,Midline Lower Leg o Dry Gauze o Boardered Foam Dressing - or telfa island dressing Dressing Change Frequency Wound #1 Left,Midline Lower Leg o Change dressing every other day. Follow-up Appointments Wound #1 Left,Midline Lower Leg o Return Appointment in 1 week. Edema Control Wound #1 Left,Midline Lower Leg o Elevate legs to the level of the heart and pump ankles as often as possible Additional Orders / Instructions Wound #1 Left,Midline Lower Leg o Increase protein intake. o Other: - Please add vitamin A, vitamin C and zinc supplements to your diet DEVEION, DENZ ([id number]) Electronic Signature(s) Signed: 03/02/2017 5:15:33 PM By: Montey Hora Signed: 03/02/2017 6:01:27 PM By: Linton Ham MD Entered By: Montey Hora on 03/02/2017 [place], [place] ([id number]) -------------------------------------------------------------------------------- Problem List Details Patient Name: Benjamin Soto Date of Service: 03/02/2017 1:30 PM Medical Record Number: [id number] Patient Account Number: [id number] Date of Birth/Sex: 1936/03/24 (81 y.o. 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 Male) Treating RN: Montey Hora Primary Care Provider: Elsie Stain Other Clinician: Referring Provider: Elsie Stain Treating Provider/Extender: Tito Dine in Treatment: 3 Subjective Chief Complaint Information obtained from Patient 02/09/17; Patient is here for review of a traumatic wound on the left leg anteriory History of Present Illness (HPI) 02/09/17; 81 year old man with a hx of type 2 DM, smoker. He suffered a fall 17 days ago while working on the [employer] of his trailer. He didn't realized anything had happed until he returned home. Has been applying polysporin. Received Cephalexin finishes on Saturday. No prior wound history. ABI's 0.96 on the left. No known hx of PAD. Hx of Afib on Coumadin,chf,st 2 crf 02/16/17; patient arrives today with the condition of his wound improved he is on silver  alginate. Culture of the divot medially last week grew 2 different types of enterococcus including Enterococcus faecalis. He is on Augmentin. Surface of the wound looks better. Still debridement required today 02/23/17; traumatic wound on the left anterior leg. He has finished Augmentin. He found debridement last week painful fortunately none was required today 03/02/17; traumatic wound on the left anterior leg. Still 75% necrotic surface material requiring debridement. Most of the rest that it is stable Objective Constitutional Sitting or standing Blood Pressure is within target range for patient.. Pulse regular and within target range for patient.Marland Kitchen Respirations regular, non-labored and within target range.. Temperature is normal and within the target range for the patient.Marland Kitchen appears in no  distress. Vitals Time Taken: 1:36 PM, Height: 69 in, Weight: 185 lbs, BMI: 27.3, Temperature: 98.4 F, Pulse: 71 bpm, Respiratory Rate: 18 breaths/min, Blood Pressure: 135/71 mmHg. General Notes: Wound exam; using a #3 curet the bride meant of surface slough and nonviable subcutaneous tissue. The wound cleans up quite nicely. The medial Devitt is better. No evidence of surrounding infection Malczewski, Seanpaul ([id number]) Integumentary (Hair, Skin) Wound #1 status is Open. Original cause of wound was Trauma. The wound is located on the Left,Midline Lower Leg. The wound measures 4.4cm length x 2cm width x 0.2cm depth; 6.912cm^2 area and 1.382cm^3 volume. There is Fat Layer (Subcutaneous Tissue) Exposed exposed. There is no tunneling or undermining noted. There is a large amount of serous drainage noted. The wound margin is flat and intact. There is large (67-100%) red granulation within the wound bed. There is a small (1-33%) amount of necrotic tissue within the wound bed including Eschar and Adherent Slough. The periwound skin appearance exhibited: Ecchymosis. The periwound skin appearance did not exhibit: Callus, Crepitus, Excoriation, Induration, Rash, Scarring, Dry/Scaly, Maceration, Atrophie Blanche, Cyanosis, Hemosiderin Staining, Mottled, Pallor, Rubor, Erythema. Periwound temperature was noted as No Abnormality. The periwound has tenderness on palpation. 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 There was a Skin/Subcutaneous Tissue Debridement ([id number]) debridement with total area of 8.8 sq cm performed by Ricard Dillon, MD. with the following instrument(s): Curette to remove Viable and Non-Viable tissue/material  including Fibrin/Slough, Eschar, and Subcutaneous after achieving pain control using Lidocaine 4% Topical Solution. A time out was conducted at 14:02, prior to the start of the procedure. A Moderate amount of bleeding was controlled with Pressure. The procedure was tolerated well with a pain level of 0 throughout and a pain level of 0 following the procedure. Post Debridement Measurements: 4.4cm length x 2cm width x 0.3cm depth; 2.073cm^3 volume. Character of Wound/Ulcer Post Debridement is improved. Post procedure Diagnosis Wound #1: Same as Pre-Procedure Plan SIPRIANO, FENDLEY ([id number]) Wound Cleansing: Wound #1 Left,Midline Lower Leg: Clean wound with Normal Saline. May Shower, gently pat wound dry prior to applying new dressing. Anesthetic: Wound #1 Left,Midline Lower Leg: Topical Lidocaine 4% cream applied to wound bed prior to debridement Primary Wound Dressing: Wound #1 Left,Midline Lower Leg: Hydrafera Blue Secondary Dressing: Wound #1 Left,Midline Lower Leg: Dry Gauze Boardered Foam Dressing - or telfa island dressing Dressing Change Frequency: Wound #1 Left,Midline Lower Leg: Change dressing every other day. Follow-up Appointments: Wound #1 Left,Midline Lower Leg: Return Appointment in 1 week. Edema Control: Wound #1 Left,Midline Lower Leg: Elevate legs to the level of  the heart and pump ankles as often as possible Additional Orders / Instructions: Wound #1 Left,Midline Lower Leg: Increase protein intake. Other: - Please add vitamin A, vitamin C and zinc supplements to your diet #1 I change the primary dressing to [facility] with border foam cover. Electronic Signature(s) Signed: 03/02/2017 6:01:27 PM By: Linton Ham MD Entered By: Linton Ham on 03/02/2017 15:52:48 Benjamin Soto ([id number]) -------------------------------------------------------------------------------- [place] Details Patient Name: Benjamin Soto Date of Service:  03/02/2017 Medical Record Number: [id number] Patient Account Number: [id number] Date of Birth/Sex: 1936-07-26 (81 y.o. 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 Facility Procedures CPT4 Code Description: [id number] 11042 - DEB SUBQ TISSUE 20 SQ CM/< ICD-10 Description Diagnosis S81.812D Laceration without foreign body, left lower leg, su L97.223 Non-pressure chronic ulcer of left calf with necros Modifier: bsequent enc is of muscle Quantity: 1 ounter Physician Procedures CPT4 Code Description: [id number] 11042 - WC PHYS SUBQ TISS 20 SQ CM ICD-10 Description Diagnosis S81.812D Laceration without foreign body, left lower leg, su L97.223 Non-pressure chronic ulcer of left calf with necros Modifier: bsequent enco is of muscle Quantity: 1 Personal assistant) Signed: 03/02/2017 6:01:27 PM By: Linton Ham MD Entered By: Linton Ham on 03/02/2017 15:53:11

## 2017-03-04 DIAGNOSIS — H04123 Dry eye syndrome of bilateral lacrimal glands: Secondary | ICD-10-CM | POA: Diagnosis not present

## 2017-03-04 DIAGNOSIS — H01001 Unspecified blepharitis right upper eyelid: Secondary | ICD-10-CM | POA: Diagnosis not present

## 2017-03-04 DIAGNOSIS — H401231 Low-tension glaucoma, bilateral, mild stage: Secondary | ICD-10-CM | POA: Diagnosis not present

## 2017-03-04 DIAGNOSIS — H11129 Conjunctival concretions, unspecified eye: Secondary | ICD-10-CM | POA: Diagnosis not present

## 2017-03-09 ENCOUNTER — Ambulatory Visit (INDEPENDENT_AMBULATORY_CARE_PROVIDER_SITE_OTHER): Payer: Medicare Other

## 2017-03-09 ENCOUNTER — Encounter: Payer: Medicare Other | Admitting: Internal Medicine

## 2017-03-09 DIAGNOSIS — M1990 Unspecified osteoarthritis, unspecified site: Secondary | ICD-10-CM | POA: Diagnosis not present

## 2017-03-09 DIAGNOSIS — S81802A Unspecified open wound, left lower leg, initial encounter: Secondary | ICD-10-CM | POA: Diagnosis not present

## 2017-03-09 DIAGNOSIS — I4891 Unspecified atrial fibrillation: Secondary | ICD-10-CM | POA: Diagnosis not present

## 2017-03-09 DIAGNOSIS — I130 Hypertensive heart and chronic kidney disease with heart failure and stage 1 through stage 4 chronic kidney disease, or unspecified chronic kidney disease: Secondary | ICD-10-CM | POA: Diagnosis not present

## 2017-03-09 DIAGNOSIS — E1122 Type 2 diabetes mellitus with diabetic chronic kidney disease: Secondary | ICD-10-CM | POA: Diagnosis not present

## 2017-03-09 DIAGNOSIS — Z5181 Encounter for therapeutic drug level monitoring: Secondary | ICD-10-CM | POA: Diagnosis not present

## 2017-03-09 DIAGNOSIS — E11610 Type 2 diabetes mellitus with diabetic neuropathic arthropathy: Secondary | ICD-10-CM | POA: Diagnosis not present

## 2017-03-09 DIAGNOSIS — L97223 Non-pressure chronic ulcer of left calf with necrosis of muscle: Secondary | ICD-10-CM | POA: Diagnosis not present

## 2017-03-09 DIAGNOSIS — H409 Unspecified glaucoma: Secondary | ICD-10-CM | POA: Diagnosis not present

## 2017-03-09 DIAGNOSIS — I509 Heart failure, unspecified: Secondary | ICD-10-CM | POA: Diagnosis not present

## 2017-03-09 DIAGNOSIS — J449 Chronic obstructive pulmonary disease, unspecified: Secondary | ICD-10-CM | POA: Diagnosis not present

## 2017-03-09 DIAGNOSIS — S81812D Laceration without foreign body, left lower leg, subsequent encounter: Secondary | ICD-10-CM | POA: Diagnosis not present

## 2017-03-09 DIAGNOSIS — N189 Chronic kidney disease, unspecified: Secondary | ICD-10-CM | POA: Diagnosis not present

## 2017-03-09 DIAGNOSIS — Z7901 Long term (current) use of anticoagulants: Secondary | ICD-10-CM | POA: Diagnosis not present

## 2017-03-09 LAB — POCT INR: INR: 3

## 2017-03-09 NOTE — Patient Instructions (Signed)
Pre visit review using our clinic review tool, if applicable. No additional management support is needed unless otherwise documented below in the visit note. 

## 2017-03-10 NOTE — Progress Notes (Signed)
SPENSER, [name] ([id number]) Visit Report for 03/09/2017 Chief Complaint Document Details Patient Name: Benjamin Soto, Benjamin Soto Date of Service: 03/09/2017 10:15 AM Medical Record Number: [id number] Patient Account Number: [id number] Date of Birth/Sex: 1936-08-01 (81 y.o. Male) Treating RN: Cornell Barman Primary Care Provider: Elsie Stain Other Clinician: Referring Provider: Elsie Stain Treating Provider/Extender: Tito Dine in Treatment: 4 Information Obtained from: Patient Chief Complaint 02/09/17; Patient is here for review of a traumatic wound on the left leg anteriory Electronic Signature(s) Signed: 03/09/2017 4:47:51 PM By: Linton Ham MD Entered By: Linton Ham on 03/09/2017 10:40:34 Benjamin Soto ([id number]) -------------------------------------------------------------------------------- HPI Details Patient Name: Benjamin Soto Date of Service: 03/09/2017 10:15 AM Medical Record Number: [id number] Patient Account Number: [id number] Date of Birth/Sex: 1936/01/15 (81 y.o. Male) Treating RN: Cornell Barman Primary Care Provider: Elsie Stain Other Clinician: Referring Provider: Elsie Stain Treating Provider/Extender: Tito Dine in Treatment: 4 History of Present Illness HPI Description: 02/09/17; 81 year old man with a hx of type 2 DM, smoker. He suffered a fall 17 days ago while working on the [employer] of his trailer. He didn't realized anything had happed until he returned home. Has been applying polysporin. Received Cephalexin finishes on Saturday. No prior wound history. ABI's 0.96 on the left. No known hx of PAD. Hx of Afib on Coumadin,chf,st 2 crf 02/16/17; patient arrives today with the condition of his wound improved he is on silver alginate. Culture of the divot medially last week grew 2 different types of enterococcus including Enterococcus faecalis. He is on Augmentin. Surface of the wound looks better. Still debridement required today 02/23/17;  traumatic wound on the left anterior leg. He has finished Augmentin. He found debridement last week painful fortunately none was required today 03/02/17; traumatic wound on the left anterior leg. Still 75% necrotic surface material requiring debridement. Most of the rest that it is stable 03/09/17; initially traumatic wound on his left leg. Arrives today with complaints of more pain and more erythema around the wound. States the pain is severe enough that it keeps him awake at night. The original wound surface itself looks good and the divot that was laterally has closed over. We switched him to [facility] last week. Wife shows me a lot of drainage coming out of the wound surface Electronic Signature(s) Signed: 03/09/2017 4:47:51 PM By: Linton Ham MD Entered By: Linton Ham on 03/09/2017 10:41:58 Benjamin Soto ([id number]) -------------------------------------------------------------------------------- Physical Exam Details Patient Name: Benjamin Soto Date of Service: 03/09/2017 10:15 AM Medical Record Number: [id number] Patient Account Number: [id number] Date of Birth/Sex: 02-13-1936 (81 y.o. Male) Treating RN: Cornell Barman Primary Care Provider: Elsie Stain Other Clinician: Referring Provider: Elsie Stain Treating Provider/Extender: Ricard Dillon Weeks in Treatment: 4 Constitutional Sitting or standing Blood Pressure is within target range for patient.. Pulse regular and within target range for patient.Marland Kitchen Respirations regular, non-labored and within target range.. Temperature is normal and within the target range for the patient.Marland Kitchen appears in no distress. Eyes Conjunctivae clear. No discharge. Cardiovascular Pedal pulses palpable and strong bilaterally.. Edema is well controlled.. Lymphatic Unpalpable in the popliteal or inguinal area. Psychiatric No evidence of depression, anxiety, or agitation. Calm, cooperative, and communicative. Appropriate interactions and  affect.. Notes Wound Exam; no debridement was required. The surface of this wound actually looks quite healthy. Surrounding the wound however is a regular areas of erythema that are quite tender. Although he complains of pain up and down his leg I see no good reason for this there is no erythema no swelling  except in a small area around the wound that I have marked. Electronic Signature(s) Signed: 03/09/2017 4:47:51 PM By: Linton Ham MD Entered By: Linton Ham on 03/09/2017 10:46:35 Benjamin Soto ([id number]) -------------------------------------------------------------------------------- Physician Orders Details Patient Name: Benjamin Soto Date of Service: 03/09/2017 10:15 AM Medical Record Number: [id number] Patient Account Number: [id number] Date of Birth/Sex: [birth year] (81 y.o. Male) Treating RN: Cornell Barman Primary Care Provider: Elsie Stain Other Clinician: Referring Provider: Elsie Stain Treating Provider/Extender: Tito Dine in Treatment: 4 Verbal / Phone Orders: Yes Clinician: Cornell Barman Read Back and Verified: Yes Diagnosis Coding Wound Cleansing Wound #1 Left,Midline Lower Leg o Clean wound with Normal Saline. o May Shower, gently pat wound dry prior to applying new dressing. Anesthetic Wound #1 Left,Midline Lower Leg o Topical Lidocaine 4% cream applied to wound bed prior to debridement Primary Wound Dressing Wound #1 Left,Midline Lower Leg o Aquacel Ag Secondary Dressing Wound #1 Left,Midline Lower Leg o Dry Gauze o Boardered Foam Dressing - or telfa island dressing Dressing Change Frequency Wound #1 Left,Midline Lower Leg o Change dressing every other day. Follow-up Appointments Wound #1 Left,Midline Lower Leg o Return Appointment in 1 week. Edema Control Wound #1 Left,Midline Lower Leg o Elevate legs to the level of the heart and pump ankles as often as possible Additional Orders / Instructions Wound #1  Left,Midline Lower Leg o Increase protein intake. o Other: - Please add vitamin A, vitamin C and zinc supplements to your diet GRAE, CANNATA ([id number]) Patient Medications Allergies: No Known Allergies Notifications Medication Indication Start End doxycycline monohydrate cellulitis 03/09/2017 DOSE oral 100 mg capsule - 1 capsule oral bid Electronic Signature(s) Signed: 03/09/2017 10:48:20 AM By: Linton Ham MD Entered By: Linton Ham on 03/09/2017 10:48:19 Benjamin Soto ([id number]) -------------------------------------------------------------------------------- Problem List Details Patient Name: Benjamin Soto Date of Service: 03/09/2017 10:15 AM Medical Record Number: [id number] Patient Account Number: [id number] Date of Birth/Sex: 1936-04-13 (81 y.o. 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 Male) Treating RN: Cornell Barman Primary Care Provider: Elsie Stain Other Clinician: Referring Provider: Elsie Stain Treating Provider/Extender: Ricard Dillon Weeks in Treatment: 4 Subjective Chief Complaint Information obtained from Patient 02/09/17; Patient is here for review of a traumatic wound on the left leg anteriory History of Present Illness (HPI) 02/09/17; 81 year old man with a hx of type 2 DM, smoker. He suffered a fall 17 days ago while working on the [employer] of his trailer. He didn't realized anything had happed until he returned home. Has been applying polysporin. Received Cephalexin finishes on Saturday. No prior wound history. ABI's 0.96 on the left. No known hx of PAD. Hx of Afib on Coumadin,chf,st 2 crf 02/16/17; patient arrives today with the condition of his wound improved he is on silver alginate. Culture of the divot medially last week grew 2 different types of enterococcus including Enterococcus faecalis.  He is on Augmentin. Surface of the wound looks better. Still debridement required today 02/23/17; traumatic wound on the left anterior leg. He has finished Augmentin. He found debridement last week painful fortunately none was required today 03/02/17; traumatic wound on the left anterior leg. Still 75% necrotic surface material requiring debridement. Most of the rest that it is stable 03/09/17; initially traumatic wound on his left leg. Arrives today with complaints of more pain and more erythema around the wound. States the pain is severe enough that it keeps him awake at night. The original wound surface itself looks good and the divot that was laterally has closed over. We switched him to [facility] last week. Wife shows me a lot of drainage coming out of the wound surface Objective Constitutional Sitting or standing Blood Pressure is within target range for patient.. Pulse regular and within target range for patient.Marland Kitchen Respirations regular, non-labored and within target range.. Temperature is normal and within the target range for the patient.Marland Kitchen appears in no distress. Vitals Time Taken: 10:06 AM, Height: 69 in, Weight:  185 lbs, BMI: 27.3, Pulse: 74 bpm, Respiratory Rate: 16 breaths/min, Blood Pressure: 127/72 mmHg. Eyes VINAYAK, BOBIER ([id number]) Conjunctivae clear. No discharge. Cardiovascular Pedal pulses palpable and strong bilaterally.. Edema is well controlled.. Lymphatic Unpalpable in the popliteal or inguinal area. Psychiatric No evidence of depression, anxiety, or agitation. Calm, cooperative, and communicative. Appropriate interactions and affect.. General Notes: Wound Exam; no debridement was required. The surface of this wound actually looks quite healthy. Surrounding the wound however is a regular areas of erythema that are quite tender. Although he complains of pain up and down his leg I see no good reason for this there is no erythema no swelling except in a small area around the wound that I have marked. Integumentary (Hair, Skin) Wound #1 status is Open. Original cause of wound was Trauma. The wound is located on the Left,Midline Lower Leg. The wound measures 5cm length x 2cm width x 0.3cm depth; 7.854cm^2 area and 2.356cm^3 volume. There is Fat Layer (Subcutaneous Tissue) Exposed exposed. There is no tunneling or undermining noted. There is a large amount of serous drainage noted. The wound margin is flat and intact. There is large (67-100%) red granulation within the wound bed. There is a small (1-33%) amount of necrotic tissue within the wound bed including Eschar and Adherent Slough. The periwound skin appearance exhibited: Excoriation, Ecchymosis. The periwound skin appearance did not exhibit: Callus, Crepitus, Induration, Rash, Scarring, Dry/Scaly, Maceration, Atrophie Blanche, Cyanosis, Hemosiderin Staining, Mottled, Pallor, Rubor, Erythema. Periwound temperature was noted as No Abnormality. The periwound has tenderness on palpation. Assessment Active Problems ICD-10 S81.812D - Laceration without foreign body, left lower leg, subsequent encounter L97.223 - Non-pressure chronic  ulcer of left calf with necrosis of muscle L03.116 - Cellulitis of left lower limb Plan Wound Cleansing: BHAVIK, CABINESS ([id number]) Wound #1 Left,Midline Lower Leg: Clean wound with Normal Saline. May Shower, gently pat wound dry prior to applying new dressing. Anesthetic: Wound #1 Left,Midline Lower Leg: Topical Lidocaine 4% cream applied to wound bed prior to debridement Primary Wound Dressing: Wound #1 Left,Midline Lower Leg: Aquacel Ag Secondary Dressing: Wound #1 Left,Midline Lower Leg: Dry Gauze Boardered Foam Dressing - or telfa island dressing Dressing Change Frequency: Wound #1 Left,Midline Lower Leg: Change dressing every other day. Follow-up Appointments: Wound #1 Left,Midline Lower Leg: Return Appointment in 1 week. Edema Control: Wound #1 Left,Midline Lower Leg: Elevate legs to the level of the heart and pump ankles as often  as possible Additional Orders / Instructions: Wound #1 Left,Midline Lower Leg: Increase protein intake. Other: - Please add vitamin A, vitamin C and zinc supplements to your diet The following medication(s) was prescribed: doxycycline monohydrate oral 100 mg capsule 1 capsule oral bid for cellulitis starting 03/09/2017 #1 primary dressing to silver alginate from Hydrofera Blue. #2 surrounding erythema and tenderness likely cellulitis but could be a reaction to the Hydrofera Blue on normal skin #3 empiric doxycycline 100 twice a day o7 days. Nothing looked like it needed culturing today #4 the surface of the wound actually looks very healthy and that they've it medially has closed in #5 I've asked them to check his INR on Friday at the [facility] clinic in [facility] #6 they will change the dressing we put on tomorrow and look at the marks that I have provided on the erythema just to make sure that there is no progression and then everything is stable they can change the dressing every 2 days MACALLAN, ORD  ([id number]) Electronic Signature(s) Signed: 03/09/2017 4:47:51 PM By: Linton Ham MD Entered By: Linton Ham on 03/09/2017 10:50:17 Benjamin Soto ([id number]) -------------------------------------------------------------------------------- [place] Details Patient Name: Benjamin Soto Date of Service: 03/09/2017 Medical Record Number: [id number] Patient Account Number: [id number] Date of Birth/Sex: 28-Sep-1935 (81 y.o. 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 L03.116 Cellulitis of left lower limb Facility Procedures CPT4 Code: [id number] Description: 99213 - WOUND CARE VISIT-LEV 3 EST PT Modifier: Quantity: 1 Physician Procedures CPT4 Code Description: [id number] 99213 - WC PHYS LEVEL 3 - EST PT ICD-10 Description Diagnosis S81.812D Laceration without foreign body, left lower leg, L97.223 Non-pressure chronic ulcer of left calf with necr Modifier: subsequent enco osis of muscle Quantity: 1 Environmental consultant Signature(s) Signed: 03/09/2017 4:47:51 PM By: Linton Ham MD Signed: 03/09/2017 5:11:11 PM By: Alric Quan Entered By: Alric Quan on 03/09/2017 12:36:34

## 2017-03-11 ENCOUNTER — Ambulatory Visit (INDEPENDENT_AMBULATORY_CARE_PROVIDER_SITE_OTHER): Payer: Medicare Other

## 2017-03-11 DIAGNOSIS — Z5181 Encounter for therapeutic drug level monitoring: Secondary | ICD-10-CM

## 2017-03-11 LAB — POCT INR: INR: 2.5

## 2017-03-11 NOTE — Patient Instructions (Signed)
Pre visit review using our clinic review tool, if applicable. No additional management support is needed unless otherwise documented below in the visit note. 

## 2017-03-11 NOTE — Progress Notes (Signed)
Benjamin Soto, Benjamin Soto ([id number]) Visit Report for 03/09/2017 Arrival Information Details Patient Name: Benjamin Soto, Benjamin Soto Date of Service: 03/09/2017 10:15 AM Medical Record Number: [id number] Patient Account Number: [id number] Date of Birth/Sex: March 29, 1936 (81 y.o. Male) Treating RN: Cornell Barman Primary Care Rosalio Catterton: Elsie Stain Other Clinician: Referring Gilford Lardizabal: Elsie Stain Treating Gladstone Rosas/Extender: Tito Dine in Treatment: 4 Visit Information History Since Last Visit Added or deleted any medications: No Patient Arrived: Ambulatory Any new allergies or adverse reactions: No Arrival Time: 10:03 Had a fall or experienced change in No Accompanied By: wife activities of daily living that may affect Transfer Assistance: None risk of falls: Patient Has Alerts: Yes Signs or symptoms of abuse/neglect since last No Patient Alerts: Patient on Blood Thinner visito warfarin Hospitalized since last visit: No Type II Diabetic Has Dressing in Place as Prescribed: Yes Pain Present Now: No Electronic Signature(s) Signed: 03/10/2017 8:26:41 AM By: Gretta Cool, BSN, RN, CWS, Kim RN, BSN Entered By: Gretta Cool, BSN, RN, CWS, Kim on 03/09/2017 10:05:53 Benjamin Soto ([id number]) -------------------------------------------------------------------------------- Clinic Level of Care Assessment Details Patient Name: Benjamin Soto Date of Service: 03/09/2017 10:15 AM Medical Record Number: [id number] Patient Account Number: [id number] Date of Birth/Sex: [birth year] (81 y.o. Male) Treating RN: Ahmed Prima Primary Care Braelin Costlow: Elsie Stain Other Clinician: Referring Jahzir Strohmeier: Elsie Stain Treating Tabari Volkert/Extender: Tito Dine in Treatment: 4 Clinic Level of Care Assessment Items TOOL 4 Quantity Score X - Use when only an EandM is performed on FOLLOW-UP visit 1 0 ASSESSMENTS - Nursing Assessment / Reassessment X - Reassessment of Co-morbidities (includes updates in  patient status) 1 10 X - Reassessment of Adherence to Treatment Plan 1 5 ASSESSMENTS - Wound and Skin Assessment / Reassessment X - Simple Wound Assessment / Reassessment - one wound 1 5 []  - Complex Wound Assessment / Reassessment - multiple wounds 0 []  - Dermatologic / Skin Assessment (not related to wound area) 0 ASSESSMENTS - Focused Assessment []  - Circumferential Edema Measurements - multi extremities 0 []  - Nutritional Assessment / Counseling / Intervention 0 []  - Lower Extremity Assessment (monofilament, tuning fork, pulses) 0 []  - Peripheral Arterial Disease Assessment (using hand held doppler) 0 ASSESSMENTS - Ostomy and/or Continence Assessment and Care []  - Incontinence Assessment and Management 0 []  - Ostomy Care Assessment and Management (repouching, etc.) 0 PROCESS - Coordination of Care []  - Simple Patient / Family Education for ongoing care 0 X - Complex (extensive) Patient / Family Education for ongoing care 1 20 X - Staff obtains Programmer, systems, Records, Test Results / Process Orders 1 10 []  - Staff telephones HHA, Nursing Homes / Clarify orders / etc 0 []  - Routine Transfer to another Facility (non-emergent condition) 0 Benjamin Soto, Benjamin Soto ([id number]) []  - Routine Hospital Admission (non-emergent condition) 0 []  - New Admissions / Biomedical engineer / Ordering NPWT, Apligraf, etc. 0 []  - Emergency Hospital Admission (emergent condition) 0 X - Simple Discharge Coordination 1 10 []  - Complex (extensive) Discharge Coordination 0 PROCESS - Special Needs []  - Pediatric / Minor Patient Management 0 []  - Isolation Patient Management 0 []  - Hearing / Language / Visual special needs 0 []  - Assessment of Community assistance (transportation, D/C planning, etc.) 0 []  - Additional assistance / Altered mentation 0 []  - Support Surface(s) Assessment (bed, cushion, seat, etc.) 0 INTERVENTIONS - Wound Cleansing / Measurement X - Simple Wound Cleansing - one wound 1 5 []  - Complex  Wound Cleansing - multiple wounds 0 X - Wound Imaging (photographs - any number of wounds) 1 5 []  -  Wound Tracing (instead of photographs) 0 []  - Simple Wound Measurement - one wound 0 []  - Complex Wound Measurement - multiple wounds 0 INTERVENTIONS - Wound Dressings []  - Small Wound Dressing one or multiple wounds 0 X - Medium Wound Dressing one or multiple wounds 1 15 []  - Large Wound Dressing one or multiple wounds 0 X - Application of Medications - topical 1 5 []  - Application of Medications - injection 0 INTERVENTIONS - Miscellaneous []  - External ear exam 0 Benjamin Soto, Benjamin Soto ([id number]) []  - Specimen Collection (cultures, biopsies, blood, body fluids, etc.) 0 []  - Specimen(s) / Culture(s) sent or taken to Lab for analysis 0 []  - Patient Transfer (multiple staff / Harrel Lemon Lift / Similar devices) 0 []  - Simple Staple / Suture removal (25 or less) 0 []  - Complex Staple / Suture removal (26 or more) 0 []  - Hypo / Hyperglycemic Management (close monitor of Blood Glucose) 0 []  - Ankle / Brachial Index (ABI) - do not check if billed separately 0 X - Vital Signs 1 5 Has the patient been seen at the hospital within the last three years: Yes Total Score: 95 Level Of Care: New/Established - Level 3 Electronic Signature(s) Signed: 03/09/2017 5:11:11 PM By: Alric Quan Entered By: Alric Quan on 03/09/2017 12:36:26 Benjamin Soto ([id number]) -------------------------------------------------------------------------------- Encounter Discharge Information Details Patient Name: Benjamin Soto Date of Service: 03/09/2017 10:15 AM Medical Record Number: [id number] Patient Account Number: [id number] Date of Birth/Sex: 1936/07/10 (81 y.o. Male) Treating RN: Cornell Barman Primary Care Sylus Stgermain: Elsie Stain Other Clinician: Referring Quantisha Marsicano: Elsie Stain Treating Rosalena Mccorry/Extender: Tito Dine in Treatment: 4 Encounter Discharge Information Items Discharge Pain Level:  0 Discharge Condition: Stable Ambulatory Status: Ambulatory Discharge Destination: Home Transportation: Private Auto Accompanied By: wife Schedule Follow-up Appointment: Yes Medication Reconciliation completed and provided to Patient/Care No Livi Mcgann: Provided on Clinical Summary of Care: 03/09/2017 Form Type Recipient Paper Patient HD Electronic Signature(s) Signed: 03/09/2017 10:32:34 AM By: Ruthine Dose Entered By: Ruthine Dose on 03/09/2017 10:32:34 Benjamin Soto ([id number]) -------------------------------------------------------------------------------- Lower Extremity Assessment Details Patient Name: Benjamin Soto Date of Service: 03/09/2017 10:15 AM Medical Record Number: [id number] Patient Account Number: [id number] Date of Birth/Sex: [birth year] (80 y.o. Male) Treating RN: Cornell Barman Primary Care Jazalynn Mireles: Elsie Stain Other Clinician: Referring Agam Davenport: Elsie Stain Treating Averi Cacioppo/Extender: Ricard Dillon Weeks in Treatment: 4 Vascular Assessment Claudication: Claudication Assessment [Left:Rest Pain] Pulses: Dorsalis Pedis Palpable: [Left:Yes] Posterior Tibial Extremity colors, hair growth, and conditions: Extremity Color: [Left:Normal] Hair Growth on Extremity: [Left:Yes] Temperature of Extremity: [Left:Warm] Capillary Refill: [Left:< 3 seconds] Toe Nail Assessment Left: Right: Thick: No Discolored: No Deformed: No Improper Length and Hygiene: No Electronic Signature(s) Signed: 03/10/2017 8:26:41 AM By: Gretta Cool, BSN, RN, CWS, Kim RN, BSN Entered By: Gretta Cool, BSN, RN, CWS, Kim on 03/09/2017 10:12:47 Benjamin Soto ([id number]) -------------------------------------------------------------------------------- Multi Wound Chart Details Patient Name: Benjamin Soto Date of Service: 03/09/2017 10:15 AM Medical Record Number: [id number] Patient Account Number: [id number] Date of Birth/Sex: 11-05-1935 (80 y.o. Male) Treating RN: Cornell Barman Primary  Care Zvi Duplantis: Elsie Stain Other Clinician: Referring Ginevra Tacker: Elsie Stain Treating Kehlani Vancamp/Extender: Ricard Dillon Weeks in Treatment: 4 Vital Signs Height(in): 69 Pulse(bpm): 74 Weight(lbs): 185 Blood Pressure 127/72 (mmHg): Body Mass Index(BMI): 27 Temperature(F): Respiratory Rate 16 (breaths/min): Photos: [N/A:N/A] Wound Location: Left, Midline Lower Leg N/A N/A Wounding Event: Trauma N/A N/A Primary Etiology: Trauma, Other N/A N/A Comorbid History: Glaucoma, Chronic N/A N/A Obstructive Pulmonary Disease (COPD), Arrhythmia, Coronary Artery Disease, Hypertension, Type II Diabetes, Osteoarthritis, Neuropathy Date Acquired: 01/23/2017  N/A N/A Weeks of Treatment: 4 N/A N/A Wound Status: Open N/A N/A Measurements L x W x D 5x2x0.3 N/A N/A (cm) Area (cm) : 7.854 N/A N/A Volume (cm) : 2.356 N/A N/A % Reduction in Area: -56.20% N/A N/A % Reduction in Volume: -368.40% N/A N/A Classification: Full Thickness Without N/A N/A Exposed Support Structures HBO Classification: Grade 1 N/A N/A Exudate Amount: Large N/A N/A Benjamin Soto, Benjamin Soto ([id number]) Exudate Type: Serous N/A N/A Exudate Color: amber N/A N/A Wound Margin: Flat and Intact N/A N/A Granulation Amount: Large (67-100%) N/A N/A Granulation Quality: Red N/A N/A Necrotic Amount: Small (1-33%) N/A N/A Necrotic Tissue: Eschar, Adherent Slough N/A N/A Exposed Structures: Fat Layer (Subcutaneous N/A N/A Tissue) Exposed: Yes Fascia: No Tendon: No Muscle: No Joint: No Bone: No Epithelialization: None N/A N/A Periwound Skin Texture: Excoriation: Yes N/A N/A Induration: No Callus: No Crepitus: No Rash: No Scarring: No Periwound Skin Maceration: No N/A N/A Moisture: Dry/Scaly: No Periwound Skin Color: Ecchymosis: Yes N/A N/A Atrophie Blanche: No Cyanosis: No Erythema: No Hemosiderin Staining: No Mottled: No Pallor: No Rubor: No Temperature: No Abnormality N/A N/A Tenderness on Yes N/A  N/A Palpation: Wound Preparation: Ulcer Cleansing: N/A N/A Rinsed/Irrigated with Saline Topical Anesthetic Applied: Other: lidociane 4% Treatment Notes Wound #1 (Left, Midline Lower Leg) 1. Cleansed with: Clean wound with Normal Saline 2. Anesthetic Topical Lidocaine 4% cream to wound bed prior to debridement 3. Peri-wound Care: Benjamin Soto, Benjamin Soto ([id number]) Skin Prep 4. Dressing Applied: Aquacel Ag 5. Secondary Dressing Applied Bordered Foam Dressing Dry Gauze Electronic Signature(s) Signed: 03/09/2017 4:47:51 PM By: Linton Ham MD Entered By: Linton Ham on 03/09/2017 10:40:23 Benjamin Soto ([id number]) -------------------------------------------------------------------------------- Multi-Disciplinary Care Plan Details Patient Name: Benjamin Soto Date of Service: 03/09/2017 10:15 AM Medical Record Number: [id number] Patient Account Number: [id number] Date of Birth/Sex: 10-13-1935 (81 y.o. Male) Treating RN: Cornell Barman Primary Care Ervan Heber: Elsie Stain Other Clinician: Referring Ildefonso Keaney: Elsie Stain Treating Favio Moder/Extender: Tito Dine in Treatment: 4 Active Inactive ` Abuse / Safety / Falls / Self Care Management Nursing Diagnoses: Potential for falls Goals: Patient will not experience any injury related to falls Date Initiated: 02/09/2017 Target Resolution Date: 04/23/2017 Goal Status: Active Interventions: Assess fall risk on admission and as needed Notes: ` Orientation to the Wound Care Program Nursing Diagnoses: Knowledge deficit related to the wound healing center program Goals: Patient/caregiver will verbalize understanding of the [facility] Program Date Initiated: 02/09/2017 Target Resolution Date: 04/23/2017 Goal Status: Active Interventions: Provide education on orientation to the wound center Notes: ` Wound/Skin Impairment Nursing Diagnoses: Impaired tissue integrity Benjamin Soto, Benjamin Soto  ([id number]) Goals: Patient/caregiver will verbalize understanding of skin care regimen Date Initiated: 02/09/2017 Target Resolution Date: 04/23/2017 Goal Status: Active Ulcer/skin breakdown will have a volume reduction of 30% by week 4 Date Initiated: 02/09/2017 Target Resolution Date: 04/23/2017 Goal Status: Active Ulcer/skin breakdown will have a volume reduction of 50% by week 8 Date Initiated: 02/09/2017 Target Resolution Date: 04/23/2017 Goal Status: Active Ulcer/skin breakdown will have a volume reduction of 80% by week 12 Date Initiated: 02/09/2017 Target Resolution Date: 04/23/2017 Goal Status: Active Ulcer/skin breakdown will heal within 14 weeks Date Initiated: 02/09/2017 Target Resolution Date: 04/23/2017 Goal Status: Active Interventions: Assess patient/caregiver ability to obtain necessary supplies Assess patient/caregiver ability to perform ulcer/skin care regimen upon admission and as needed Assess ulceration(s) every visit Notes: Electronic Signature(s) Signed: 03/10/2017 8:26:41 AM By: Gretta Cool, BSN, RN, CWS, Kim RN, BSN Entered By: Gretta Cool, BSN, RN, CWS, Kim on 03/09/2017 10:12:52 Benjamin Soto ([id number]) --------------------------------------------------------------------------------  Pain Assessment Details Patient Name: Benjamin Soto, Benjamin Soto Date of Service: 03/09/2017 10:15 AM Medical Record Number: [id number] Patient Account Number: [id number] Date of Birth/Sex: 01-Jan-1936 (81 y.o. Male) Treating RN: Cornell Barman Primary Care Beulah Matusek: Elsie Stain Other Clinician: Referring Jeannene Tschetter: Elsie Stain Treating Maanasa Aderhold/Extender: Tito Dine in Treatment: 4 Active Problems Location of Pain Severity and Description of Pain Patient Has Paino No Site Locations With Dressing Change: No Pain Management and Medication Current Pain Management: Goals for Pain Management Topical or injectable lidocaine is offered to patient for acute pain when surgical debridement is  performed. If needed, Patient is instructed to use over the counter pain medication for the following 24-48 hours after debridement. Wound care MDs do not prescribed pain medications. Patient has chronic pain or uncontrolled pain. Patient has been instructed to make an appointment with their Primary Care Physician for pain management. Electronic Signature(s) Signed: 03/10/2017 8:26:41 AM By: Gretta Cool, BSN, RN, CWS, Kim RN, BSN Entered By: Gretta Cool, BSN, RN, CWS, Kim on 03/09/2017 78:29:56 Benjamin Soto ([id number]) -------------------------------------------------------------------------------- Patient/Caregiver Education Details Patient Name: Benjamin Soto Date of Service: 03/09/2017 10:15 AM Medical Record Number: [id number] Patient Account Number: [id number] Date of Birth/Gender: 1936-02-04 (80 y.o. Male) Treating RN: Cornell Barman Primary Care Physician: Elsie Stain Other Clinician: Referring Physician: Elsie Stain Treating Physician/Extender: Tito Dine in Treatment: 4 Education Assessment Education Provided To: Patient Education Topics Provided Wound/Skin Impairment: Handouts: Other: change dressing as ordered Methods: Demonstration, Explain/Verbal Responses: State content correctly Electronic Signature(s) Signed: 03/10/2017 8:26:41 AM By: Gretta Cool, BSN, RN, CWS, Kim RN, BSN Entered By: Gretta Cool, BSN, RN, CWS, Kim on 03/09/2017 10:21:12 Benjamin Soto ([id number]) -------------------------------------------------------------------------------- Wound Assessment Details Patient Name: Benjamin Soto Date of Service: 03/09/2017 10:15 AM Medical Record Number: [id number] Patient Account Number: [id number] Date of Birth/Sex: [birth year] (80 y.o. Male) Treating RN: Cornell Barman Primary Care Juston Goheen: Elsie Stain Other Clinician: Referring Lander Eslick: Elsie Stain Treating Ofelia Podolski/Extender: Ricard Dillon Weeks in Treatment: 4 Wound Status Wound Number: 1 Primary  Trauma, Other Etiology: Wound Location: Left, Midline Lower Leg Wound Open Wounding Event: Trauma Status: Date Acquired: 01/23/2017 Comorbid Glaucoma, Chronic Obstructive Weeks Of Treatment: 4 History: Pulmonary Disease (COPD), Arrhythmia, Clustered Wound: No Coronary Artery Disease, Hypertension, Type II Diabetes, Osteoarthritis, Neuropathy Photos Wound Measurements Length: (cm) 5 % Reduction i Width: (cm) 2 % Reduction i Depth: (cm) 0.3 Epithelializa Area: (cm) 7.854 Tunneling: Volume: (cm) 2.356 Undermining: n Area: -56.2% n Volume: -368.4% tion: None No No Wound Description Full Thickness Without Foul Odor Af Classification: Exposed Support Structures Slough/Fibri Diabetic Severity Grade 1 (Wagner): Wound Margin: Flat and Intact Exudate Amount: Large Exudate Type: Serous Exudate Color: amber ter Cleansing: No no Yes Wound Bed Granulation Amount: Large (67-100%) Exposed Structure Granulation Quality: Red Fascia Exposed: No Benjamin Soto, Benjamin Soto ([id number]) Necrotic Amount: Small (1-33%) Fat Layer (Subcutaneous Tissue) Exposed: Yes Necrotic Quality: Eschar, Adherent Slough Tendon Exposed: No Muscle Exposed: No Joint Exposed: No Bone Exposed: No Periwound Skin Texture Texture Color No Abnormalities Noted: No No Abnormalities Noted: No Callus: No Atrophie Blanche: No Crepitus: No Cyanosis: No Excoriation: Yes Ecchymosis: Yes Induration: No Erythema: No Rash: No Hemosiderin Staining: No Scarring: No Mottled: No Pallor: No Moisture Rubor: No No Abnormalities Noted: No Dry / Scaly: No Temperature / Pain Maceration: No Temperature: No Abnormality Tenderness on Palpation: Yes Wound Preparation Ulcer Cleansing: Rinsed/Irrigated with Saline Topical Anesthetic Applied: Other: lidociane 4%, Treatment Notes Wound #1 (Left, Midline Lower Leg) 1. Cleansed with: Clean wound with Normal Saline 2. Anesthetic Topical Lidocaine 4%  cream to wound bed  prior to debridement 3. Peri-wound Care: Skin Prep 4. Dressing Applied: Aquacel Ag 5. Secondary Dressing Applied Bordered Foam Dressing Dry Gauze Electronic Signature(s) Signed: 03/10/2017 8:26:41 AM By: Gretta Cool, BSN, RN, CWS, Kim RN, BSN Entered By: Gretta Cool, BSN, RN, CWS, Kim on 03/09/2017 10:28:13 Benjamin Soto ([id number]) -------------------------------------------------------------------------------- [place] Details Patient Name: Benjamin Soto Date of Service: 03/09/2017 10:15 AM Medical Record Number: [id number] Patient Account Number: [id number] Date of Birth/Sex: [birth year] (80 y.o. Male) Treating RN: Cornell Barman Primary Care Rodriques Badie: Elsie Stain Other Clinician: Referring Maricela Schreur: Elsie Stain Treating Larken Urias/Extender: Ricard Dillon Weeks in Treatment: 4 Vital Signs Time Taken: 10:06 Pulse (bpm): 74 Height (in): 69 Respiratory Rate (breaths/min): 16 Weight (lbs): 185 Blood Pressure (mmHg): 127/72 Body Mass Index (BMI): 27.3 Reference Range: 80 - 120 mg / dl Electronic Signature(s) Signed: 03/10/2017 8:26:41 AM By: Gretta Cool, BSN, RN, CWS, Kim RN, BSN Entered By: Gretta Cool, BSN, RN, CWS, Kim on 03/09/2017 10:06:35

## 2017-03-14 NOTE — Progress Notes (Signed)
agree.  thanks.  

## 2017-03-16 ENCOUNTER — Other Ambulatory Visit (INDEPENDENT_AMBULATORY_CARE_PROVIDER_SITE_OTHER): Payer: Medicare Other

## 2017-03-16 ENCOUNTER — Encounter: Payer: Medicare Other | Admitting: Internal Medicine

## 2017-03-16 ENCOUNTER — Ambulatory Visit (INDEPENDENT_AMBULATORY_CARE_PROVIDER_SITE_OTHER): Payer: Medicare Other

## 2017-03-16 DIAGNOSIS — I509 Heart failure, unspecified: Secondary | ICD-10-CM | POA: Diagnosis not present

## 2017-03-16 DIAGNOSIS — Z5181 Encounter for therapeutic drug level monitoring: Secondary | ICD-10-CM | POA: Diagnosis not present

## 2017-03-16 DIAGNOSIS — Z7901 Long term (current) use of anticoagulants: Secondary | ICD-10-CM

## 2017-03-16 DIAGNOSIS — S81812D Laceration without foreign body, left lower leg, subsequent encounter: Secondary | ICD-10-CM | POA: Diagnosis not present

## 2017-03-16 DIAGNOSIS — S81802A Unspecified open wound, left lower leg, initial encounter: Secondary | ICD-10-CM | POA: Diagnosis not present

## 2017-03-16 DIAGNOSIS — L97223 Non-pressure chronic ulcer of left calf with necrosis of muscle: Secondary | ICD-10-CM | POA: Diagnosis not present

## 2017-03-16 DIAGNOSIS — E1122 Type 2 diabetes mellitus with diabetic chronic kidney disease: Secondary | ICD-10-CM | POA: Diagnosis not present

## 2017-03-16 DIAGNOSIS — I130 Hypertensive heart and chronic kidney disease with heart failure and stage 1 through stage 4 chronic kidney disease, or unspecified chronic kidney disease: Secondary | ICD-10-CM | POA: Diagnosis not present

## 2017-03-16 DIAGNOSIS — I4891 Unspecified atrial fibrillation: Secondary | ICD-10-CM

## 2017-03-16 DIAGNOSIS — E11610 Type 2 diabetes mellitus with diabetic neuropathic arthropathy: Secondary | ICD-10-CM | POA: Diagnosis not present

## 2017-03-16 DIAGNOSIS — M1990 Unspecified osteoarthritis, unspecified site: Secondary | ICD-10-CM | POA: Diagnosis not present

## 2017-03-16 DIAGNOSIS — J449 Chronic obstructive pulmonary disease, unspecified: Secondary | ICD-10-CM | POA: Diagnosis not present

## 2017-03-16 DIAGNOSIS — H409 Unspecified glaucoma: Secondary | ICD-10-CM | POA: Diagnosis not present

## 2017-03-16 DIAGNOSIS — N189 Chronic kidney disease, unspecified: Secondary | ICD-10-CM | POA: Diagnosis not present

## 2017-03-16 LAB — POCT INR: INR: 1.4

## 2017-03-16 NOTE — Patient Instructions (Signed)
Patient received education resource, including the self-management goal and tool. Patient verbalized understanding. 

## 2017-03-17 NOTE — Progress Notes (Signed)
Benjamin Soto, Benjamin Soto ([id number]) Visit Report for 03/16/2017 Arrival Information Details Patient Name: Benjamin Soto, Benjamin Soto Date of Service: 03/16/2017 1:30 PM Medical Record Number: [id number] Patient Account Number: [id number] Date of Birth/Sex: 1936-07-19 (81 y.o. Male) Treating RN: Montey Hora Primary Care Axel Meas: Elsie Stain Other Clinician: Referring Bernadene Garside: Elsie Stain Treating Lilyauna Miedema/Extender: Tito Dine in Treatment: 5 Visit Information History Since Last Visit Added or deleted any medications: No Patient Arrived: Ambulatory Any new allergies or adverse reactions: No Arrival Time: 13:46 Had a fall or experienced change in No Accompanied By: spouse activities of daily living that may affect Transfer Assistance: None risk of falls: Patient Identification Verified: Yes Signs or symptoms of abuse/neglect since last No Secondary Verification Process Yes visito Completed: Hospitalized since last visit: No Patient Has Alerts: Yes Has Dressing in Place as Prescribed: Yes Patient Alerts: Patient on Blood Pain Present Now: No Thinner warfarin Type II Diabetic Electronic Signature(s) Signed: 03/16/2017 4:48:20 PM By: Montey Hora Entered By: Montey Hora on 03/16/2017 13:47:10 Benjamin Soto ([id number]) -------------------------------------------------------------------------------- Encounter Discharge Information Details Patient Name: Benjamin Soto Date of Service: 03/16/2017 1:30 PM Medical Record Number: [id number] Patient Account Number: [id number] Date of Birth/Sex: 01/27/1936 (81 y.o. Male) Treating RN: Montey Hora Primary Care Jenilyn Magana: Elsie Stain Other Clinician: Referring Tip Atienza: Elsie Stain Treating Pearson Reasons/Extender: Tito Dine in Treatment: 5 Encounter Discharge Information Items Discharge Pain Level: 0 Discharge Condition: Stable Ambulatory Status: Ambulatory Discharge Destination: Home Transportation:  Private Auto Accompanied By: spouse Schedule Follow-up Appointment: Yes Medication Reconciliation completed and provided to Patient/Care No Mavin Dyke: Provided on Clinical Summary of Care: 03/16/2017 Form Type Recipient Paper Patient HD Electronic Signature(s) Signed: 03/16/2017 2:28:44 PM By: Ruthine Dose Entered By: Ruthine Dose on 03/16/2017 14:28:43 Benjamin Soto ([id number]) -------------------------------------------------------------------------------- Lower Extremity Assessment Details Patient Name: Benjamin Soto Date of Service: 03/16/2017 1:30 PM Medical Record Number: [id number] Patient Account Number: [id number] Date of Birth/Sex: 20-Apr-1936 (81 y.o. Male) Treating RN: Montey Hora Primary Care Nandika Stetzer: Elsie Stain Other Clinician: Referring Damiah Mcdonald: Elsie Stain Treating Meiko Ives/Extender: Ricard Dillon Weeks in Treatment: 5 Vascular Assessment Pulses: Dorsalis Pedis Palpable: [Left:Yes] Posterior Tibial Extremity colors, hair growth, and conditions: Extremity Color: [Left:Normal] Hair Growth on Extremity: [Left:Yes] Temperature of Extremity: [Left:Warm] Capillary Refill: [Left:< 3 seconds] Electronic Signature(s) Signed: 03/16/2017 4:48:20 PM By: Montey Hora Entered By: Montey Hora on 03/16/2017 13:55:10 Benjamin Soto ([id number]) -------------------------------------------------------------------------------- Multi Wound Chart Details Patient Name: Benjamin Soto Date of Service: 03/16/2017 1:30 PM Medical Record Number: [id number] Patient Account Number: [id number] Date of Birth/Sex: 13-Jan-1936 (81 y.o. Male) Treating RN: Montey Hora Primary Care Pansie Guggisberg: Elsie Stain Other Clinician: Referring Bernie Ransford: Elsie Stain Treating Dottie Vaquerano/Extender: Ricard Dillon Weeks in Treatment: 5 Vital Signs Height(in): 69 Pulse(bpm): 74 Weight(lbs): 185 Blood Pressure 123/53 (mmHg): Body Mass Index(BMI): 27 Temperature(F):  98.3 Respiratory Rate 16 (breaths/min): Photos: [1:No Photos] [N/A:N/A] Wound Location: [1:Left Lower Leg - Midline] [N/A:N/A] Wounding Event: [1:Trauma] [N/A:N/A] Primary Etiology: [1:Trauma, Other] [N/A:N/A] Comorbid History: [1:Glaucoma, Chronic Obstructive Pulmonary Disease (COPD), Arrhythmia, Coronary Artery Disease, Hypertension, Type II Diabetes, Osteoarthritis, Neuropathy] [N/A:N/A] Date Acquired: [1:01/23/2017] [N/A:N/A] Weeks of Treatment: [1:5] [N/A:N/A] Wound Status: [1:Open] [N/A:N/A] Measurements L x W x D 4.8x2.6x0.2 [N/A:N/A] (cm) Area (cm) : [1:9.802] [N/A:N/A] Volume (cm) : [1:1.96] [N/A:N/A] % Reduction in Area: [1:-95.00%] [N/A:N/A] % Reduction in Volume: -289.70% [N/A:N/A] Classification: [1:Full Thickness Without Exposed Support Structures] [N/A:N/A] HBO Classification: [1:Grade 1] [N/A:N/A] Exudate Amount: [1:Large] [N/A:N/A] Exudate Type: [1:Serous] [N/A:N/A] Exudate Color: [1:amber] [N/A:N/A] Wound Margin: [1:Flat and Intact] [N/A:N/A] Granulation Amount: [1:Large (67-100%)] [N/A:N/A]  Granulation Quality: Red N/A N/A Necrotic Amount: Small (1-33%) N/A N/A Necrotic Tissue: Eschar, Adherent Slough N/A N/A Exposed Structures: Fat Layer (Subcutaneous N/A N/A Tissue) Exposed: Yes Fascia: No Tendon: No Muscle: No Joint: No Bone: No Epithelialization: None N/A N/A Debridement: Debridement ([id number]- N/A N/A 11047) Pre-procedure 14:12 N/A N/A Verification/Time Out Taken: Pain Control: Lidocaine 4% Topical N/A N/A Solution Tissue Debrided: Fibrin/Slough, N/A N/A Subcutaneous Level: Skin/Subcutaneous N/A N/A Tissue Debridement Area (sq 12.48 N/A N/A cm): Instrument: Curette N/A N/A Bleeding: Minimum N/A N/A Hemostasis Achieved: Pressure N/A N/A Procedural Pain: 0 N/A N/A Post Procedural Pain: 0 N/A N/A Debridement Treatment Procedure was tolerated N/A N/A Response: well Post Debridement 4.8x2.6x0.3 N/A N/A Measurements L x W x D (cm) Post  Debridement 2.941 N/A N/A Volume: (cm) Periwound Skin Texture: Excoriation: Yes N/A N/A Induration: No Callus: No Crepitus: No Rash: No Scarring: No Periwound Skin Maceration: No N/A N/A Moisture: Dry/Scaly: No Periwound Skin Color: Ecchymosis: Yes N/A N/A Atrophie Blanche: No Cyanosis: No Erythema: No Hemosiderin Staining: No Mottled: No Little, Long ([id number]) Pallor: No Rubor: No Temperature: No Abnormality N/A N/A Tenderness on Yes N/A N/A Palpation: Wound Preparation: Ulcer Cleansing: N/A N/A Rinsed/Irrigated with Saline Topical Anesthetic Applied: Other: lidociane 4% Procedures Performed: Debridement N/A N/A Treatment Notes Wound #1 (Left, Midline Lower Leg) 1. Cleansed with: Clean wound with Normal Saline 2. Anesthetic Topical Lidocaine 4% cream to wound bed prior to debridement 3. Peri-wound Care: Skin Prep 4. Dressing Applied: Aquacel Ag Other dressing (specify in notes) 5. Secondary Dressing Applied Bordered Foam Dressing Dry Gauze Notes drawtex Electronic Signature(s) Signed: 03/16/2017 4:49:17 PM By: Linton Ham MD Entered By: Linton Ham on 03/16/2017 14:46:20 Benjamin Soto ([id number]) -------------------------------------------------------------------------------- Multi-Disciplinary Care Plan Details Patient Name: Benjamin Soto Date of Service: 03/16/2017 1:30 PM Medical Record Number: [id number] Patient Account Number: [id number] Date of Birth/Sex: 1936/03/28 (81 y.o. Male) Treating RN: Montey Hora Primary Care Montgomery Favor: Elsie Stain Other Clinician: Referring Azariya Freeman: Elsie Stain Treating Lucindia Lemley/Extender: Tito Dine in Treatment: 5 Active Inactive ` Abuse / Safety / Falls / Self Care Management Nursing Diagnoses: Potential for falls Goals: Patient will not experience any injury related to falls Date Initiated: 02/09/2017 Target Resolution Date: 04/23/2017 Goal Status:  Active Interventions: Assess fall risk on admission and as needed Notes: ` Orientation to the Wound Care Program Nursing Diagnoses: Knowledge deficit related to the wound healing center program Goals: Patient/caregiver will verbalize understanding of the [place] Program Date Initiated: 02/09/2017 Target Resolution Date: 04/23/2017 Goal Status: Active Interventions: Provide education on orientation to the wound center Notes: ` Wound/Skin Impairment Nursing Diagnoses: Impaired tissue integrity THEOPLIS, GARCIAGARCIA ([id number]) Goals: Patient/caregiver will verbalize understanding of skin care regimen Date Initiated: 02/09/2017 Target Resolution Date: 04/23/2017 Goal Status: Active Ulcer/skin breakdown will have a volume reduction of 30% by week 4 Date Initiated: 02/09/2017 Target Resolution Date: 04/23/2017 Goal Status: Active Ulcer/skin breakdown will have a volume reduction of 50% by week 8 Date Initiated: 02/09/2017 Target Resolution Date: 04/23/2017 Goal Status: Active Ulcer/skin breakdown will have a volume reduction of 80% by week 12 Date Initiated: 02/09/2017 Target Resolution Date: 04/23/2017 Goal Status: Active Ulcer/skin breakdown will heal within 14 weeks Date Initiated: 02/09/2017 Target Resolution Date: 04/23/2017 Goal Status: Active Interventions: Assess patient/caregiver ability to obtain necessary supplies Assess patient/caregiver ability to perform ulcer/skin care regimen upon admission and as needed Assess ulceration(s) every visit Notes: Electronic Signature(s) Signed: 03/16/2017 4:48:20 PM By: Montey Hora Entered By: Montey Hora on 03/16/2017 14:06:43 Benjamin Soto ([id number]) --------------------------------------------------------------------------------  Pain Assessment Details Patient Name: Benjamin Soto, Benjamin Soto Date of Service: 03/16/2017 1:30 PM Medical Record Number: [id number] Patient Account Number: [id number] Date of Birth/Sex: 06/18/1936 (81 y.o.  Male) Treating RN: Montey Hora Primary Care Melissa Pulido: Elsie Stain Other Clinician: Referring Addysyn Fern: Elsie Stain Treating Lycan Davee/Extender: Ricard Dillon Weeks in Treatment: 5 Active Problems Location of Pain Severity and Description of Pain Patient Has Paino Yes Site Locations Pain Location: Pain in Ulcers With Dressing Change: Yes Duration of the Pain. Constant / Intermittento Intermittent Pain Management and Medication Current Pain Management: Notes Topical or injectable lidocaine is offered to patient for acute pain when surgical debridement is performed. If needed, Patient is instructed to use over the counter pain medication for the following 24-48 hours after debridement. Wound care MDs do not prescribed pain medications. Patient has chronic pain or uncontrolled pain. Patient has been instructed to make an appointment with their Primary Care Physician for pain management. Electronic Signature(s) Signed: 03/16/2017 4:48:20 PM By: Montey Hora Entered By: Montey Hora on 03/16/2017 13:51:21 Benjamin Soto ([id number]) -------------------------------------------------------------------------------- Patient/Caregiver Education Details Patient Name: Benjamin Soto Date of Service: 03/16/2017 1:30 PM Medical Record Number: [id number] Patient Account Number: [id number] Date of Birth/Gender: February 24, 1936 (81 y.o. Male) Treating RN: Montey Hora Primary Care Physician: Elsie Stain Other Clinician: Referring Physician: Elsie Stain Treating Physician/Extender: Tito Dine in Treatment: 5 Education Assessment Education Provided To: Patient and Caregiver Education Topics Provided Wound/Skin Impairment: Handouts: Other: wound care as ordered Methods: Demonstration, Explain/Verbal Responses: State content correctly Electronic Signature(s) Signed: 03/16/2017 4:48:20 PM By: Montey Hora Entered By: Montey Hora on 03/16/2017  14:13:32 Benjamin Soto ([id number]) -------------------------------------------------------------------------------- Wound Assessment Details Patient Name: Benjamin Soto Date of Service: 03/16/2017 1:30 PM Medical Record Number: [id number] Patient Account Number: [id number] Date of Birth/Sex: September 11, 1936 (81 y.o. Male) Treating RN: Montey Hora Primary Care Mashal Slavick: Elsie Stain Other Clinician: Referring Briena Swingler: Elsie Stain Treating Christon Gallaway/Extender: Ricard Dillon Weeks in Treatment: 5 Wound Status Wound Number: 1 Primary Trauma, Other Etiology: Wound Location: Left Lower Leg - Midline Wound Open Wounding Event: Trauma Status: Date Acquired: 01/23/2017 Comorbid Glaucoma, Chronic Obstructive Weeks Of Treatment: 5 History: Pulmonary Disease (COPD), Arrhythmia, Clustered Wound: No Coronary Artery Disease, Hypertension, Type II Diabetes, Osteoarthritis, Neuropathy Photos Photo Uploaded By: Montey Hora on 03/16/2017 14:59:37 Wound Measurements Length: (cm) 4.8 Width: (cm) 2.6 Depth: (cm) 0.2 Area: (cm) 9.802 Volume: (cm) 1.96 % Reduction in Area: -95% % Reduction in Volume: -289.7% Epithelialization: None Tunneling: No Undermining: No Wound Description Full Thickness Without Classification: Exposed Support Structures Diabetic Severity Grade 1 (Wagner): Wound Margin: Flat and Intact Exudate Amount: Large Exudate Type: Serous Exudate Color: amber Benjamin Soto, Benjamin Soto ([id number]) Foul Odor After Cleansing: No Slough/Fibrino Yes Wound Bed Granulation Amount: Large (67-100%) Exposed Structure Granulation Quality: Red Fascia Exposed: No Necrotic Amount: Small (1-33%) Fat Layer (Subcutaneous Tissue) Exposed: Yes Necrotic Quality: Eschar, Adherent Slough Tendon Exposed: No Muscle Exposed: No Joint Exposed: No Bone Exposed: No Periwound Skin Texture Texture Color No Abnormalities Noted: No No Abnormalities Noted: No Callus: No Atrophie Blanche:  No Crepitus: No Cyanosis: No Excoriation: Yes Ecchymosis: Yes Induration: No Erythema: No Rash: No Hemosiderin Staining: No Scarring: No Mottled: No Pallor: No Moisture Rubor: No No Abnormalities Noted: No Dry / Scaly: No Temperature / Pain Maceration: No Temperature: No Abnormality Tenderness on Palpation: Yes Wound Preparation Ulcer Cleansing: Rinsed/Irrigated with Saline Topical Anesthetic Applied: Other: lidociane 4%, Treatment Notes Wound #1 (Left, Midline Lower Leg) 1. Cleansed with: Clean wound with Normal Saline 2. Anesthetic Topical Lidocaine  4% cream to wound bed prior to debridement 3. Peri-wound Care: Skin Prep 4. Dressing Applied: Aquacel Ag Other dressing (specify in notes) 5. Secondary Dressing Applied Bordered Foam Dressing Dry Gauze Notes drawtex Electronic Signature(s) Benjamin Soto, Benjamin Soto ([id number]) Signed: 03/16/2017 4:48:20 PM By: Montey Hora Entered By: Montey Hora on 03/16/2017 14:07:33 Benjamin Soto ([id number]) -------------------------------------------------------------------------------- [place] Details Patient Name: Benjamin Soto Date of Service: 03/16/2017 1:30 PM Medical Record Number: [id number] Patient Account Number: [id number] Date of Birth/Sex: 24-Mar-1936 (81 y.o. Male) Treating RN: Montey Hora Primary Care Norm Wray: Elsie Stain Other Clinician: Referring Mckinzi Eriksen: Elsie Stain Treating Halla Chopp/Extender: Tito Dine in Treatment: 5 Vital Signs Time Taken: 13:51 Temperature (F): 98.3 Height (in): 69 Pulse (bpm): 74 Weight (lbs): 185 Respiratory Rate (breaths/min): 16 Body Mass Index (BMI): 27.3 Blood Pressure (mmHg): 123/53 Reference Range: 80 - 120 mg / dl Electronic Signature(s) Signed: 03/16/2017 4:48:20 PM By: Montey Hora Entered By: Montey Hora on 03/16/2017 13:51:43

## 2017-03-17 NOTE — Progress Notes (Signed)
JAYCEON, TROY ([id number]) Visit Report for 03/16/2017 Chief Complaint Document Details Patient Name: Benjamin Soto, Benjamin Soto Date of Service: 03/16/2017 1:30 PM Medical Record Number: [id number] Patient Account Number: [id number] Date of Birth/Sex: 1936-09-13 (81 y.o. Male) Treating RN: Montey Hora Primary Care Provider: Elsie Stain Other Clinician: Referring Provider: Elsie Stain Treating Provider/Extender: Tito Dine in Treatment: 5 Information Obtained from: Patient Chief Complaint 02/09/17; Patient is here for review of a traumatic wound on the left leg anteriory Electronic Signature(s) Signed: 03/16/2017 4:49:17 PM By: Linton Ham MD Entered By: Linton Ham on 03/16/2017 14:46:41 Benjamin Soto ([id number]) -------------------------------------------------------------------------------- Debridement Details Patient Name: Benjamin Soto Date of Service: 03/16/2017 1:30 PM Medical Record Number: [id number] Patient Account Number: [id number] Date of Birth/Sex: 15-Dec-1935 (81 y.o. Male) Treating RN: Montey Hora Primary Care Provider: Elsie Stain Other Clinician: Referring Provider: Elsie Stain Treating Provider/Extender: Tito Dine in Treatment: 5 Debridement Performed for Wound #1 Left,Midline Lower Leg Assessment: Performed By: Physician Ricard Dillon, MD Debridement: Debridement Pre-procedure Verification/Time Out Yes - 14:12 Taken: Start Time: 14:12 Pain Control: Lidocaine 4% Topical Solution Level: Skin/Subcutaneous Tissue Total Area Debrided (L x 4.8 (cm) x 2.6 (cm) = 12.48 (cm) W): Tissue and other Viable, Non-Viable, Fibrin/Slough, Subcutaneous material debrided: Instrument: Curette Bleeding: Minimum Hemostasis Achieved: Pressure End Time: 14:14 Procedural Pain: 0 Post Procedural Pain: 0 Response to Treatment: Procedure was tolerated well Post Debridement Measurements of Total Wound Length: (cm) 4.8 Width:  (cm) 2.6 Depth: (cm) 0.3 Volume: (cm) 2.941 Character of Wound/Ulcer Post Improved Debridement: Post Procedure Diagnosis Same as Pre-procedure Electronic Signature(s) Signed: 03/16/2017 4:48:20 PM By: Montey Hora Signed: 03/16/2017 4:49:17 PM By: Linton Ham MD Entered By: Linton Ham on 03/16/2017 [place], [place] ([id number]) -------------------------------------------------------------------------------- HPI Details Patient Name: Benjamin Soto Date of Service: 03/16/2017 1:30 PM Medical Record Number: [id number] Patient Account Number: [id number] Date of Birth/Sex: 1936/06/05 (81 y.o. Male) Treating RN: Montey Hora Primary Care Provider: Elsie Stain Other Clinician: Referring Provider: Elsie Stain Treating Provider/Extender: Ricard Dillon Weeks in Treatment: 5 History of Present Illness HPI Description: 02/09/17; 81 year old man with a hx of type 2 DM, smoker. He suffered a fall 17 days ago while working on the [employer] of his trailer. He didn't realized anything had happed until he returned home. Has been applying polysporin. Received Cephalexin finishes on Saturday. No prior wound history. ABI's 0.96 on the left. No known hx of PAD. Hx of Afib on Coumadin,chf,st 2 crf 02/16/17; patient arrives today with the condition of his wound improved he is on silver alginate. Culture of the divot medially last week grew 2 different types of enterococcus including Enterococcus faecalis. He is on Augmentin. Surface of the wound looks better. Still debridement required today 02/23/17; traumatic wound on the left anterior leg. He has finished Augmentin. He found debridement last week painful fortunately none was required today 03/02/17; traumatic wound on the left anterior leg. Still 75% necrotic surface material requiring debridement. Most of the rest that it is stable 03/09/17; initially traumatic wound on his left leg. Arrives today with complaints of more pain and  more erythema around the wound. States the pain is severe enough that it keeps him awake at night. The original wound surface itself looks good and the divot that was laterally has closed over. We switched him to [facility] last week. Wife shows me a lot of drainage coming out of the wound surface 03/16/17; the patient comes in today with a better-looking wound circumference no erythema. He is  completed the doxycycline I gave him. He still complains of a lot of pain which he states radiates into the foot and keeps him awake at night. It seems that the original injury happened when he fell backwards and the anterior leg came up against part of a trailer hitch is the best I'm able to determine. He also hurt his lower coccyx. We have been using silver alginate since last week largely because of the infection Electronic Signature(s) Signed: 03/16/2017 4:49:17 PM By: Linton Ham MD Entered By: Linton Ham on 03/16/2017 14:48:03 Benjamin Soto ([id number]) -------------------------------------------------------------------------------- Physical Exam Details Patient Name: Benjamin Soto Date of Service: 03/16/2017 1:30 PM Medical Record Number: [id number] Patient Account Number: [id number] Date of Birth/Sex: 05/17/1936 (81 y.o. Male) Treating RN: Montey Hora Primary Care Provider: Elsie Stain Other Clinician: Referring Provider: Elsie Stain Treating Provider/Extender: Ricard Dillon Weeks in Treatment: 5 Constitutional Sitting or standing Blood Pressure is within target range for patient.. Pulse regular and within target range for patient.Marland Kitchen Respirations regular, non-labored and within target range.. Temperature is normal and within the target range for the patient.Marland Kitchen appears in no distress. Cardiovascular Pedal pulses palpable and strong bilaterally.. Lymphatic Nothing palpable on the left popliteal or inguinal area. Integumentary (Hair, Skin) No rashes  noted. Neurological Light touch is normal in the patient's foot. He has good range of motion the ankle and full power.. Psychiatric No evidence of depression, anxiety, or agitation. Calm, cooperative, and communicative. Appropriate interactions and affect.. Notes Wound exam; the wound is certainly not down in dimensions although the lateral divot has closed down. This was the site where there was originally infection. The erythema from last week appears to have resolved. Using an open curet I debrided the surface of this wound and there was some necrotic material on this/bioburden. Hopefully this will help. Electronic Signature(s) Signed: 03/16/2017 4:49:17 PM By: Linton Ham MD Entered By: Linton Ham on 03/16/2017 14:53:36 Benjamin Soto ([id number]) -------------------------------------------------------------------------------- Physician Orders Details Patient Name: Benjamin Soto Date of Service: 03/16/2017 1:30 PM Medical Record Number: [id number] Patient Account Number: [id number] Date of Birth/Sex: 1936/01/25 (81 y.o. Male) Treating RN: Montey Hora Primary Care Provider: Elsie Stain Other Clinician: Referring Provider: Elsie Stain Treating Provider/Extender: Tito Dine in Treatment: 5 Verbal / Phone Orders: No Diagnosis Coding Wound Cleansing Wound #1 Left,Midline Lower Leg o Clean wound with Normal Saline. o May Shower, gently pat wound dry prior to applying new dressing. Anesthetic Wound #1 Left,Midline Lower Leg o Topical Lidocaine 4% cream applied to wound bed prior to debridement Primary Wound Dressing Wound #1 Left,Midline Lower Leg o Aquacel Ag Secondary Dressing Wound #1 Left,Midline Lower Leg o Dry Gauze o Boardered Foam Dressing - or telfa island dressing o Drawtex Dressing Change Frequency Wound #1 Left,Midline Lower Leg o Change dressing every other day. Follow-up Appointments Wound #1 Left,Midline Lower  Leg o Return Appointment in 1 week. Edema Control Wound #1 Left,Midline Lower Leg o Elevate legs to the level of the heart and pump ankles as often as possible Additional Orders / Instructions Wound #1 Left,Midline Lower Leg o Increase protein intake. o Other: - Please add vitamin A, vitamin C and zinc supplements to your diet Benjamin Soto, Benjamin Soto ([id number]) Electronic Signature(s) Signed: 03/16/2017 4:48:20 PM By: Montey Hora Signed: 03/16/2017 4:49:17 PM By: Linton Ham MD Entered By: Montey Hora on 03/16/2017 14:19:28 KYZER, BLOWE ([id number]) -------------------------------------------------------------------------------- Problem List Details Patient Name: Benjamin Soto Date of Service: 03/16/2017 1:30 PM Medical Record Number: [id number] Patient Account Number: [id number] Date of Birth/Sex:  [birth year] (81 y.o. 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 Male) Treating RN: Montey Hora Primary Care Provider: Elsie Stain Other  Clinician: Referring Provider: Elsie Stain Treating Provider/Extender: Tito Dine in Treatment: 5 Subjective Chief Complaint Information obtained from Patient 02/09/17; Patient is here for review of a traumatic wound on the left leg anteriory History of Present Illness (HPI) 02/09/17; 81 year old man with a hx of type 2 DM, smoker. He suffered a fall 17 days ago while working on the [employer] of his trailer. He didn't realized anything had happed until he returned home. Has been applying polysporin. Received Cephalexin finishes on Saturday. No prior wound history. ABI's 0.96 on the left. No known hx of PAD. Hx of Afib on Coumadin,chf,st 2 crf 02/16/17; patient arrives today with the condition of his wound improved he is on silver alginate. Culture of the divot medially last week grew 2 different types of enterococcus including Enterococcus faecalis. He is on Augmentin. Surface of the wound looks better. Still debridement required today 02/23/17; traumatic wound on the left anterior leg. He has finished Augmentin. He found debridement last week painful fortunately none was required today 03/02/17; traumatic wound on the left anterior leg. Still 75% necrotic surface material requiring debridement. Most of the rest that it is stable 03/09/17; initially traumatic wound on his left leg. Arrives today with complaints of more pain and more erythema around the wound. States the pain is severe enough that it keeps him awake at night. The original wound surface itself looks good and the divot that was laterally has closed over. We switched him to [facility] last week. Wife shows me a lot of drainage coming out of the wound surface 03/16/17; the patient comes in today with a better-looking wound circumference no erythema. He is completed the doxycycline I gave him. He still complains of a lot of pain which he states radiates into the foot and keeps him awake at night. It seems that the  original injury happened when he fell backwards and the anterior leg came up against part of a trailer hitch is the best I'm able to determine. He also hurt his lower coccyx. We have been using silver alginate since last week largely because of the infection Objective Constitutional Sitting or standing Blood Pressure is within target range for patient.. Pulse regular and within target range Benjamin Soto, Benjamin Soto ([id number]) for patient.Marland Kitchen Respirations regular, non-labored and within target range.. Temperature is normal and within the target range for the patient.Marland Kitchen appears in no distress. Vitals Time Taken: 1:51 PM, Height: 69 in, Weight: 185 lbs, BMI: 27.3, Temperature: 98.3 F, Pulse: 74 bpm, Respiratory Rate: 16 breaths/min, Blood Pressure: 123/53 mmHg. Cardiovascular Pedal pulses palpable and strong bilaterally.. Lymphatic Nothing palpable on the left popliteal or inguinal area. Neurological Light touch is normal in the patient's foot. He has  good range of motion the ankle and full power.. Psychiatric No evidence of depression, anxiety, or agitation. Calm, cooperative, and communicative. Appropriate interactions and affect.. General Notes: Wound exam; the wound is certainly not down in dimensions although the lateral divot has closed down. This was the site where there was originally infection. The erythema from last week appears to have resolved. Using an open curet I debrided the surface of this wound and there was some necrotic material on this/bioburden. Hopefully this will help. Integumentary (Hair, Skin) No rashes noted. Wound #1 status is Open. Original cause of wound was Trauma. The wound is located on the Left,Midline Lower Leg. The wound measures 4.8cm length x 2.6cm width x 0.2cm depth; 9.802cm^2 area and 1.96cm^3 volume. There is Fat Layer (Subcutaneous Tissue) Exposed exposed. There is no tunneling or undermining noted. There is a large amount of serous drainage noted. The  wound margin is flat and intact. There is large (67-100%) red granulation within the wound bed. There is a small (1-33%) amount of necrotic tissue within the wound bed including Eschar and Adherent Slough. The periwound skin appearance exhibited: Excoriation, Ecchymosis. The periwound skin appearance did not exhibit: Callus, Crepitus, Induration, Rash, Scarring, Dry/Scaly, Maceration, Atrophie Blanche, Cyanosis, Hemosiderin Staining, Mottled, Pallor, Rubor, Erythema. Periwound temperature was noted as No Abnormality. The periwound has tenderness on palpation. 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 There was a Skin/Subcutaneous Tissue Debridement ([id number]) debridement with total area of 12.48 sq cm performed by Ricard Dillon, MD. with the following instrument(s): Curette to remove Viable and Non-Viable tissue/material including Fibrin/Slough and Subcutaneous after achieving pain control using Lidocaine 4% Topical Solution. A time out was conducted at 14:12, prior to the start of the procedure. A Minimum amount of bleeding was controlled with Pressure. The procedure was tolerated well with a pain level of 0 throughout and a pain level of 0 following the procedure. Post Debridement Measurements: 4.8cm length x 2.6cm width x 0.3cm depth; 2.941cm^3 volume. Character of Wound/Ulcer Post Debridement is improved. Post procedure Diagnosis Wound #1: Same as Pre-Procedure Plan Wound Cleansing: Wound #1 Left,Midline Lower Leg: Clean wound with Normal Saline. May Shower, gently pat wound dry prior to applying new dressing. Anesthetic: Wound #1 Left,Midline Lower  Leg: Topical Lidocaine 4% cream applied to wound bed prior to debridement Primary Wound Dressing: Wound #1 Left,Midline Lower Leg: Aquacel Ag Secondary Dressing: Wound #1 Left,Midline Lower Leg: Dry Gauze Boardered Foam Dressing - or telfa island dressing Drawtex Dressing Change Frequency: Wound #1 Left,Midline Lower Leg: Change dressing every other day. Follow-up Appointments: Wound #1 Left,Midline Lower Leg: Return Appointment in 1 week. Edema Control: Benjamin Soto, Benjamin Soto ([id number]) Wound #1 Left,Midline Lower Leg: Elevate legs to the level of the heart and pump ankles as often as possible Additional Orders / Instructions: Wound #1 Left,Midline Lower Leg: Increase protein intake. Other: - Please add vitamin A, vitamin C and zinc supplements to your diet Aquacel Ag to continue border foam I do not have a good idea why this patient is complaining so much about pain radiating into the foot. Although it would not be impossible for him to have superficial nerve injury that should not cause pain radiating into his foot. I wondered whether  there was an alternative diagnosis/radiculopathy although his neurologic exam and the foot seems normal The wound bed post debridement actually looks quite good. I continued the silver alginate to help with the drainage for another week. Consider PolyMem AG. If we can get the drainage under control then he might be a candidate for a skin substitute. Electronic Signature(s) Signed: 03/16/2017 4:49:17 PM By: Linton Ham MD Entered By: Linton Ham on 03/16/2017 14:56:18 Benjamin Soto ([id number]) -------------------------------------------------------------------------------- Barrett Details Patient Name: Benjamin Soto Date of Service: 03/16/2017 Medical Record Number: [id number] Patient Account Number: [id number] Date of Birth/Sex: [birth year] (81 y.o. 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 L03.116 Cellulitis of left lower limb Facility Procedures CPT4 Code Description: [id number] 11042 - DEB SUBQ TISSUE 20 SQ CM/< ICD-10 Description Diagnosis S81.812D Laceration without foreign body, left lower leg, s L97.223 Non-pressure chronic ulcer of left calf with necro Modifier: ubsequent enc sis of muscle Quantity: 1 ounter Physician Procedures CPT4 Code Description: [id number] 11042 - WC PHYS SUBQ TISS 20 SQ CM ICD-10 Description Diagnosis S81.812D Laceration without foreign body, left lower leg, s L97.223 Non-pressure chronic ulcer of left calf with necro Modifier: ubsequent enco sis of muscle Quantity: 1 unter Electronic Signature(s) Signed: 03/16/2017 4:49:17 PM By: Linton Ham MD Entered By: Linton Ham on 03/16/2017 14:56:39

## 2017-03-17 NOTE — Progress Notes (Signed)
Agree, thanks

## 2017-03-22 ENCOUNTER — Telehealth: Payer: Self-pay

## 2017-03-22 NOTE — Telephone Encounter (Signed)
Mrs Osmon said pt has been going to wound doctor that Dr Damita Dunnings referred him to and pt has appt with wound care doctor 03/23/17; Mrs Crookshanks said that the wound is not any better;actually wound is twice as big as when first saw the wound care doctor and pt wants to talk with Dr Damita Dunnings about what to do next; wonders if should see different wound doctor; Mrs Sax is afraid pt might lose foot.Please advise. Pt request cb today if possible; [phone].

## 2017-03-22 NOTE — Telephone Encounter (Signed)
Called pt.  He has wound clinic OV tomorrow.  He'll get through that and then update me. We'll go from there.  I thank all involved.

## 2017-03-23 ENCOUNTER — Encounter: Payer: Medicare Other | Attending: Internal Medicine | Admitting: Internal Medicine

## 2017-03-23 ENCOUNTER — Other Ambulatory Visit: Payer: Medicare Other

## 2017-03-23 DIAGNOSIS — W19XXXD Unspecified fall, subsequent encounter: Secondary | ICD-10-CM | POA: Diagnosis not present

## 2017-03-23 DIAGNOSIS — S81812D Laceration without foreign body, left lower leg, subsequent encounter: Secondary | ICD-10-CM | POA: Diagnosis not present

## 2017-03-23 DIAGNOSIS — E11610 Type 2 diabetes mellitus with diabetic neuropathic arthropathy: Secondary | ICD-10-CM | POA: Diagnosis not present

## 2017-03-23 DIAGNOSIS — E1122 Type 2 diabetes mellitus with diabetic chronic kidney disease: Secondary | ICD-10-CM | POA: Diagnosis not present

## 2017-03-23 DIAGNOSIS — S81802A Unspecified open wound, left lower leg, initial encounter: Secondary | ICD-10-CM | POA: Diagnosis not present

## 2017-03-23 DIAGNOSIS — F17200 Nicotine dependence, unspecified, uncomplicated: Secondary | ICD-10-CM | POA: Diagnosis not present

## 2017-03-23 DIAGNOSIS — J449 Chronic obstructive pulmonary disease, unspecified: Secondary | ICD-10-CM | POA: Insufficient documentation

## 2017-03-23 DIAGNOSIS — I509 Heart failure, unspecified: Secondary | ICD-10-CM | POA: Diagnosis not present

## 2017-03-23 DIAGNOSIS — L97223 Non-pressure chronic ulcer of left calf with necrosis of muscle: Secondary | ICD-10-CM | POA: Diagnosis not present

## 2017-03-23 DIAGNOSIS — I130 Hypertensive heart and chronic kidney disease with heart failure and stage 1 through stage 4 chronic kidney disease, or unspecified chronic kidney disease: Secondary | ICD-10-CM | POA: Insufficient documentation

## 2017-03-23 DIAGNOSIS — H409 Unspecified glaucoma: Secondary | ICD-10-CM | POA: Diagnosis not present

## 2017-03-23 DIAGNOSIS — N189 Chronic kidney disease, unspecified: Secondary | ICD-10-CM | POA: Insufficient documentation

## 2017-03-23 DIAGNOSIS — M1990 Unspecified osteoarthritis, unspecified site: Secondary | ICD-10-CM | POA: Diagnosis not present

## 2017-03-23 DIAGNOSIS — Z7901 Long term (current) use of anticoagulants: Secondary | ICD-10-CM | POA: Diagnosis not present

## 2017-03-23 DIAGNOSIS — I4891 Unspecified atrial fibrillation: Secondary | ICD-10-CM | POA: Insufficient documentation

## 2017-03-23 NOTE — Telephone Encounter (Signed)
Noted. Thanks.

## 2017-03-23 NOTE — Telephone Encounter (Signed)
Patient's wife,Irmgard,called.  Patient went to the wound Clinic today.  Patient saw Dr.Robson and he wrapped the wound tightly up to patient's knee to try and push the fluid out of the wound.  The nurse will unwrap the bandage and check it and wrap the bandage up again on Thursday. Patient will go back to Wound Clinic on Wednesday.  She said Dr.Robson said the wound looks good, but the fluid needs to be pushed out. Patient is happy with Dr.Robson because he changed the plan.

## 2017-03-24 NOTE — Progress Notes (Signed)
DORNELL, GRASMICK ([id number]) Visit Report for 03/23/2017 Chief Complaint Document Details Patient Name: Benjamin Soto, Benjamin Soto Date of Service: 03/23/2017 1:30 PM Medical Record Number: [id number] Patient Account Number: [id number] Date of Birth/Sex: [birth year] (81 y.o. Male) Treating RN: Cornell Barman Primary Care Provider: Elsie Stain Other Clinician: Referring Provider: Elsie Stain Treating Provider/Extender: Tito Dine in Treatment: 6 Information Obtained from: Patient Chief Complaint 02/09/17; Patient is here for review of a traumatic wound on the left leg anteriory Electronic Signature(s) Signed: 03/23/2017 5:35:48 PM By: Linton Ham MD Entered By: Linton Ham on 03/23/2017 16:54:00 Benjamin Soto ([id number]) -------------------------------------------------------------------------------- HPI Details Patient Name: Benjamin Soto Date of Service: 03/23/2017 1:30 PM Medical Record Number: [id number] Patient Account Number: [id number] Date of Birth/Sex: Feb 04, 1936 (81 y.o. Male) Treating RN: Cornell Barman Primary Care Provider: Elsie Stain Other Clinician: Referring Provider: Elsie Stain Treating Provider/Extender: Tito Dine in Treatment: 6 History of Present Illness HPI Description: 02/09/17; 81 year old man with a hx of type 2 DM, smoker. He suffered a fall 17 days ago while working on the [employer] of his trailer. He didn't realized anything had happed until he returned home. Has been applying polysporin. Received Cephalexin finishes on Saturday. No prior wound history. ABI's 0.96 on the left. No known hx of PAD. Hx of Afib on Coumadin,chf,st 2 crf 02/16/17; patient arrives today with the condition of his wound improved he is on silver alginate. Culture of the divot medially last week grew 2 different types of enterococcus including Enterococcus faecalis. He is on Augmentin. Surface of the wound looks better. Still debridement required today 02/23/17;  traumatic wound on the left anterior leg. He has finished Augmentin. He found debridement last week painful fortunately none was required today 03/02/17; traumatic wound on the left anterior leg. Still 75% necrotic surface material requiring debridement. Most of the rest that it is stable 03/09/17; initially traumatic wound on his left leg. Arrives today with complaints of more pain and more erythema around the wound. States the pain is severe enough that it keeps him awake at night. The original wound surface itself looks good and the divot that was laterally has closed over. We switched him to [facility] last week. Wife shows me a lot of drainage coming out of the wound surface 03/16/17; the patient comes in today with a better-looking wound circumference no erythema. He is completed the doxycycline I gave him. He still complains of a lot of pain which he states radiates into the foot and keeps him awake at night. It seems that the original injury happened when he fell backwards and the anterior leg came up against part of a trailer hitch is the best I'm able to determine. He also hurt his lower coccyx. We have been using silver alginate since last week largely because of the infection 03/23/17; no major change in this man's wounds. He has leaking edema fluid. Surface of the wound looks better we have been using silver alginate. Still complaining of a lot of discomfort Electronic Signature(s) Signed: 03/23/2017 5:35:48 PM By: Linton Ham MD Entered By: Linton Ham on 03/23/2017 16:54:31 Benjamin Soto ([id number]) -------------------------------------------------------------------------------- Physical Exam Details Patient Name: Benjamin Soto Date of Service: 03/23/2017 1:30 PM Medical Record Number: [id number] Patient Account Number: [id number] Date of Birth/Sex: May 14, 1936 (81 y.o. Male) Treating RN: Cornell Barman Primary Care Provider: Elsie Stain Other Clinician: Referring  Provider: Elsie Stain Treating Provider/Extender: Ricard Dillon Weeks in Treatment: 6 Constitutional Sitting or standing Blood Pressure is within target range for  patient.. Pulse regular and within target range for patient.Marland Kitchen Respirations regular, non-labored and within target range.. Temperature is normal and within the target range for the patient.Marland Kitchen appears in no distress. Respiratory Respiratory effort is easy and symmetric bilaterally. Rate is normal at rest and on room air.. Cardiovascular Pedal pulses palpable and strong bilaterally.. Edema present in both extremities.. Lymphatic None palpable in the popliteal or inguinal area. Psychiatric No evidence of depression, anxiety, or agitation. Calm, cooperative, and communicative. Appropriate interactions and affect.. Notes Wound exam; the wound is about the same size but the surface of this is better and the lateral divot that was the source of the infection initially is closed. The erythema from last week resolved with antibiotics. No debridement is required today. He does have surrounding edema and I think with compression this might help. Electronic Signature(s) Signed: 03/23/2017 5:35:48 PM By: Linton Ham MD Entered By: Linton Ham on 03/23/2017 16:56:09 Benjamin Soto ([id number]) -------------------------------------------------------------------------------- Physician Orders Details Patient Name: Benjamin Soto Date of Service: 03/23/2017 1:30 PM Medical Record Number: [id number] Patient Account Number: [id number] Date of Birth/Sex: [birth year] (81 y.o. Male) Treating RN: Cornell Barman Primary Care Provider: Elsie Stain Other Clinician: Referring Provider: Elsie Stain Treating Provider/Extender: Tito Dine in Treatment: 6 Verbal / Phone Orders: No Diagnosis Coding Wound Cleansing Wound #1 Left,Midline Lower Leg o Clean wound with Normal Saline. o May Shower, gently pat wound dry prior to  applying new dressing. Anesthetic Wound #1 Left,Midline Lower Leg o Topical Lidocaine 4% cream applied to wound bed prior to debridement Primary Wound Dressing Wound #1 Left,Midline Lower Leg o Aquacel Secondary Dressing Wound #1 Left,Midline Lower Leg o Other - Kerra Max Care Dressing Change Frequency Wound #1 Left,Midline Lower Leg o Change dressing every other day. o Other: - Thursday Nurse Visit Follow-up Appointments Wound #1 Left,Midline Lower Leg o Return Appointment in 1 week. o Nurse Visit as needed Edema Control Wound #1 Left,Midline Lower Leg o 3 Layer Compression System - Left Lower Extremity Additional Orders / Instructions Wound #1 Left,Midline Lower Leg o Increase protein intake. o Other: - Please add vitamin A, vitamin C and zinc supplements to your diet JUSIAH, AGUAYO ([id number]) Electronic Signature(s) Signed: 03/23/2017 3:55:06 PM By: Gretta Cool, BSN, RN, CWS, Kim RN, BSN Signed: 03/23/2017 5:35:48 PM By: Linton Ham MD Entered By: Gretta Cool, BSN, RN, CWS, Kim on 03/23/2017 14:24:57 Benjamin Soto ([id number]) -------------------------------------------------------------------------------- Problem List Details Patient Name: Benjamin Soto Date of Service: 03/23/2017 1:30 PM Medical Record Number: [id number] Patient Account Number: [id number] Date of Birth/Sex: [birth year] (81 y.o. 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 Male) Treating RN: Cornell Barman Primary Care Provider: Elsie Stain Other Clinician: Referring Provider: Elsie Stain Treating Provider/Extender: Ricard Dillon Weeks in Treatment: 6 Subjective Chief Complaint Information obtained from Patient 02/09/17; Patient is here for review of a traumatic wound on  the left leg anteriory History of Present Illness (HPI) 02/09/17; 81 year old man with a hx of type 2 DM, smoker. He suffered a fall 17 days ago while working on the [employer] of his trailer. He didn't realized anything had happed until he returned home. Has been applying polysporin. Received Cephalexin finishes on Saturday. No prior wound history. ABI's 0.96 on the left. No known hx of PAD. Hx of Afib on Coumadin,chf,st 2 crf 02/16/17; patient arrives today with the condition of his wound improved he is on silver alginate. Culture of the divot medially last week grew 2 different types of enterococcus including Enterococcus faecalis. He is on Augmentin. Surface of the wound looks better. Still debridement required today 02/23/17; traumatic wound on the left anterior leg. He has finished Augmentin. He found debridement last week painful fortunately none was required today 03/02/17; traumatic wound on the left anterior leg. Still 75% necrotic surface material requiring debridement. Most of the rest that it is stable 03/09/17; initially traumatic wound on his left leg. Arrives today with complaints of more pain and more erythema around the wound. States the pain is severe enough that it keeps him awake at  night. The original wound surface itself looks good and the divot that was laterally has closed over. We switched him to [facility] last week. Wife shows me a lot of drainage coming out of the wound surface 03/16/17; the patient comes in today with a better-looking wound circumference no erythema. He is completed the doxycycline I gave him. He still complains of a lot of pain which he states radiates into the foot and keeps him awake at night. It seems that the original injury happened when he fell backwards and the anterior leg came up against part of a trailer hitch is the best I'm able to determine. He also hurt his lower coccyx. We have been using silver alginate since last week largely because of the infection 03/23/17; no major change in this man's wounds. He has leaking edema fluid. Surface of the wound looks better we have been using silver alginate. Still complaining of a lot of discomfort Objective Heathcock, Omkar ([id number]) Constitutional Sitting or standing Blood Pressure is within target range for patient.. Pulse regular and within target range for patient.Marland Kitchen Respirations regular, non-labored and within target range.. Temperature is normal and within the target range for the patient.Marland Kitchen appears in no distress. Vitals Time Taken: 1:33 PM, Height: 69 in, Weight: 185 lbs, BMI: 27.3, Temperature: 98.2 F, Pulse: 79 bpm, Respiratory Rate: 16 breaths/min, Blood Pressure: 117/77 mmHg. Respiratory Respiratory effort is easy and symmetric bilaterally. Rate is normal at rest and on room air.. Cardiovascular Pedal pulses palpable and strong bilaterally.. Edema present in both extremities.. Lymphatic None palpable in the popliteal or inguinal area. Psychiatric No evidence of depression, anxiety, or agitation. Calm, cooperative, and communicative. Appropriate interactions and affect.. General Notes: Wound exam; the wound is about the same size but the surface of this is better and  the lateral divot that was the source of the infection initially is closed. The erythema from last week resolved with antibiotics. No debridement is required today. He does have surrounding edema and I think with compression this might help. Integumentary (Hair, Skin) Wound #1 status is Open. Original cause of wound was Trauma. The wound is located on the Left,Midline Lower Leg. The wound measures 4.8cm length x 3cm width x 0.2cm depth; 11.31cm^2 area and 2.262cm^3 volume. There is Fat Layer (Subcutaneous Tissue) Exposed exposed. There is  no tunneling or undermining noted. There is a large amount of serous drainage noted. The wound margin is flat and intact. There is large (67-100%) red granulation within the wound bed. There is a small (1-33%) amount of necrotic tissue within the wound bed including Adherent Slough. The periwound skin appearance exhibited: Excoriation, Maceration, Ecchymosis. The periwound skin appearance did not exhibit: Callus, Crepitus, Induration, Rash, Scarring, Dry/Scaly, Atrophie Blanche, Cyanosis, Hemosiderin Staining, Mottled, Pallor, Rubor, Erythema. Periwound temperature was noted as No Abnormality. The periwound has tenderness on palpation. 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 There was a Three Layer Compression Therapy Procedure with a pre-treatment ABI of 1 by Cornell Barman, RN. Post procedure Diagnosis Wound #1: Same as Pre-Procedure Plan Wound Cleansing: Wound #1 Left,Midline Lower Leg: Clean wound with Normal Saline. May Shower, gently pat wound dry prior to applying new dressing. Anesthetic: Wound #1 Left,Midline Lower  Leg: Topical Lidocaine 4% cream applied to wound bed prior to debridement Primary Wound Dressing: Wound #1 Left,Midline Lower Leg: Aquacel Secondary Dressing: Wound #1 Left,Midline Lower Leg: Other Lorenza Burton Max Care Dressing Change Frequency: Wound #1 Left,Midline Lower Leg: Change dressing every other day. Other: - Thursday Nurse Visit Follow-up Appointments: Wound #1 Left,Midline Lower Leg: Return Appointment in 1 week. Nurse Visit as needed Edema Control: Wound #1 Left,Midline Lower Leg: 3 Layer Compression System - Left Lower Extremity Additional Orders / Instructions: Wound #1 Left,Midline Lower Leg: Increase protein intake. Other: - Please add vitamin A, vitamin C and zinc supplements to your diet Orantes, Nikko ([id number]) #1 Aquacel without AG #2 Kera max #33 layer compression . Electronic Signature(s) Signed: 03/23/2017 5:35:48 PM By: Linton Ham MD Entered By: Linton Ham on 03/23/2017 16:57:10 Benjamin Soto ([id number]) -------------------------------------------------------------------------------- SuperBill Details Patient Name: Benjamin Soto Date of Service: 03/23/2017 Medical Record Number: [id number] Patient Account Number: [id number] Date of Birth/Sex: 10-25-1935 (80 y.o. 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 L03.116 Cellulitis of left lower limb Facility Procedures CPT4: Description Modifier Quantity Code [phone] (Facility Use Only) [phone] - [place] LT 1 LEG Physician Procedures CPT4 Code Description: [id number] - WC PHYS LEVEL 3 - EST PT ICD-10 Description Diagnosis S81.812D  Laceration without foreign body, left lower leg, L97.223 Non-pressure chronic ulcer of left calf with necr Modifier: subsequent enco osis of muscle Quantity: 1 Personal assistant) Signed: 03/23/2017 5:35:48 PM By: Linton Ham MD Previous Signature: 03/23/2017 3:55:06 PM Version By: Gretta Cool, BSN, RN, CWS, Kim RN, BSN Entered By: Linton Ham on 03/23/2017 16:57:30

## 2017-03-25 ENCOUNTER — Encounter: Payer: Self-pay | Admitting: Family Medicine

## 2017-03-25 ENCOUNTER — Ambulatory Visit (INDEPENDENT_AMBULATORY_CARE_PROVIDER_SITE_OTHER): Payer: Medicare Other | Admitting: Family Medicine

## 2017-03-25 VITALS — BP 118/62 | HR 78 | Temp 98.2°F | Wt 185.2 lb

## 2017-03-25 DIAGNOSIS — S81812D Laceration without foreign body, left lower leg, subsequent encounter: Secondary | ICD-10-CM | POA: Diagnosis not present

## 2017-03-25 DIAGNOSIS — S51819A Laceration without foreign body of unspecified forearm, initial encounter: Secondary | ICD-10-CM | POA: Insufficient documentation

## 2017-03-25 DIAGNOSIS — Z5181 Encounter for therapeutic drug level monitoring: Secondary | ICD-10-CM

## 2017-03-25 DIAGNOSIS — N189 Chronic kidney disease, unspecified: Secondary | ICD-10-CM | POA: Diagnosis not present

## 2017-03-25 DIAGNOSIS — I509 Heart failure, unspecified: Secondary | ICD-10-CM | POA: Diagnosis not present

## 2017-03-25 DIAGNOSIS — Z7901 Long term (current) use of anticoagulants: Secondary | ICD-10-CM

## 2017-03-25 DIAGNOSIS — I130 Hypertensive heart and chronic kidney disease with heart failure and stage 1 through stage 4 chronic kidney disease, or unspecified chronic kidney disease: Secondary | ICD-10-CM | POA: Diagnosis not present

## 2017-03-25 DIAGNOSIS — S51019A Laceration without foreign body of unspecified elbow, initial encounter: Secondary | ICD-10-CM | POA: Diagnosis not present

## 2017-03-25 DIAGNOSIS — I482 Chronic atrial fibrillation, unspecified: Secondary | ICD-10-CM

## 2017-03-25 DIAGNOSIS — I4891 Unspecified atrial fibrillation: Secondary | ICD-10-CM | POA: Diagnosis not present

## 2017-03-25 DIAGNOSIS — E1122 Type 2 diabetes mellitus with diabetic chronic kidney disease: Secondary | ICD-10-CM | POA: Diagnosis not present

## 2017-03-25 DIAGNOSIS — M1990 Unspecified osteoarthritis, unspecified site: Secondary | ICD-10-CM | POA: Diagnosis not present

## 2017-03-25 DIAGNOSIS — L97223 Non-pressure chronic ulcer of left calf with necrosis of muscle: Secondary | ICD-10-CM | POA: Diagnosis not present

## 2017-03-25 DIAGNOSIS — H409 Unspecified glaucoma: Secondary | ICD-10-CM | POA: Diagnosis not present

## 2017-03-25 DIAGNOSIS — E11610 Type 2 diabetes mellitus with diabetic neuropathic arthropathy: Secondary | ICD-10-CM | POA: Diagnosis not present

## 2017-03-25 DIAGNOSIS — J449 Chronic obstructive pulmonary disease, unspecified: Secondary | ICD-10-CM | POA: Diagnosis not present

## 2017-03-25 LAB — POCT INR: INR: 2.7

## 2017-03-25 NOTE — Assessment & Plan Note (Signed)
L elbow - dressed with triple abx and bandage. Home care reviewed. Keep wound clinic f/u for L leg wound.

## 2017-03-25 NOTE — Progress Notes (Signed)
HASKELL, RIHN ([id number]) Visit Report for 03/25/2017 Arrival Information Details Patient Name: Benjamin Soto Date of Service: 03/25/2017 3:00 PM Medical Record Number: [id number] Patient Account Number: [id number] Date of Birth/Sex: [birth year] (81 y.o. Male) Treating Soto: Benjamin Soto Primary Care Benjamin Soto: Benjamin Soto Other Clinician: Referring Benjamin Soto: Benjamin Soto Treating Benjamin Soto/Extender: Benjamin Soto in Treatment: 6 Visit Information History Since Last Visit Added or deleted any medications: No Patient Arrived: Ambulatory Any new allergies or adverse reactions: No Arrival Time: 15:00 Had a fall or experienced change in No Accompanied By: wife activities of daily living that may affect Transfer Assistance: None risk of falls: Patient Identification Verified: Yes Signs or symptoms of abuse/neglect since last No Secondary Verification Process Yes visito Completed: Hospitalized since last visit: No Patient Has Alerts: Yes Has Dressing in Place as Prescribed: Yes Patient Alerts: Patient on Blood Has Compression in Place as Prescribed: Yes Thinner Pain Present Now: No warfarin Type II Diabetic Electronic Signature(s) Signed: 03/25/2017 3:33:51 PM By: Benjamin Soto, BSN, Soto, CWS, Benjamin Soto, BSN Entered By: Benjamin Soto, BSN, Soto, CWS, Benjamin on 03/25/2017 15:02:59 Benjamin Soto ([id number]) -------------------------------------------------------------------------------- Compression Therapy Details Patient Name: Benjamin Soto Date of Service: 03/25/2017 3:00 PM Medical Record Number: [id number] Patient Account Number: [id number] Date of Birth/Sex: 1935-12-24 (80 y.o. Male) Treating Soto: Benjamin Soto Primary Care Sydnei Ohaver: Benjamin Soto Other Clinician: Referring Benjamin Soto: Benjamin Soto Treating Benjamin Soto/Extender: Benjamin Soto in Treatment: 6 Compression Therapy Performed for Wound Wound #1 Left,Midline Lower Leg Assessment: Performed By: Clinician Benjamin Barman,  Soto Compression Type: Three Layer Pre Treatment ABI: 1 Electronic Signature(s) Signed: 03/25/2017 3:23:16 PM By: Benjamin Soto, BSN, Soto, CWS, Benjamin Soto, BSN Entered By: Benjamin Soto, BSN, Soto, CWS, Benjamin on 03/25/2017 15:23:16 Benjamin Soto ([id number]) -------------------------------------------------------------------------------- Encounter Discharge Information Details Patient Name: Benjamin Soto Date of Service: 03/25/2017 3:00 PM Medical Record Number: [id number] Patient Account Number: [id number] Date of Birth/Sex: [birth year] (80 y.o. Male) Treating Soto: Benjamin Soto Primary Care Burnette Valenti: Benjamin Soto Other Clinician: Referring Benjamin Soto: Benjamin Soto Treating Benjamin Soto/Extender: Benjamin Soto in Treatment: 6 Encounter Discharge Information Items Discharge Pain Level: 0 Discharge Condition: Stable Ambulatory Status: Ambulatory Discharge Destination: Home Private Transportation: Auto Accompanied By: wife Schedule Follow-up Appointment: Yes Medication Reconciliation completed and Yes provided to Patient/Care Benjamin Soto: Clinical Summary of Care: Electronic Signature(s) Signed: 03/25/2017 3:24:53 PM By: Benjamin Soto, BSN, Soto, CWS, Benjamin Soto, BSN Entered By: Benjamin Soto, BSN, Soto, CWS, Benjamin on 03/25/2017 15:24:52 Benjamin Soto ([id number]) -------------------------------------------------------------------------------- Patient/Caregiver Education Details Patient Name: Benjamin Soto Date of Service: 03/25/2017 3:00 PM Medical Record Number: [id number] Patient Account Number: [id number] Date of Birth/Gender: 12-26-1935 (80 y.o. Male) Treating Soto: Benjamin Soto Primary Care Physician: Benjamin Soto Other Clinician: Referring Physician: Elsie Soto Treating Physician/Extender: Benjamin Soto in Treatment: 6 Education Assessment Education Provided To: Patient Education Topics Provided Venous: Handouts: Controlling Swelling with Multilayered Compression Wraps, Other: do not get dressing wet Methods:  Explain/Verbal Responses: State content correctly Electronic Signature(s) Signed: 03/25/2017 3:33:51 PM By: Benjamin Soto, BSN, Soto, CWS, Benjamin Soto, BSN Entered By: Benjamin Soto, BSN, Soto, CWS, Benjamin on 03/25/2017 15:24:28 Benjamin Soto ([id number]) -------------------------------------------------------------------------------- Wound Assessment Details Patient Name: Benjamin Soto Date of Service: 03/25/2017 3:00 PM Medical Record Number: [id number] Patient Account Number: [id number] Date of Birth/Sex: [birth year] (80 y.o. Male) Treating Soto: Benjamin Soto Primary Care Nelson Julson: Benjamin Soto Other Clinician: Referring Benjamin Soto: Benjamin Soto Treating Benjamin Soto/Extender: Benjamin Soto in Treatment: 6 Wound Status Wound Number: 1 Primary Trauma, Other Etiology: Wound Location: Left Lower Leg - Midline Wound Open Wounding Event: Trauma Status: Date Acquired: 01/23/2017  Comorbid Glaucoma, Chronic Obstructive Weeks Of Treatment: 6 History: Pulmonary Disease (COPD), Clustered Wound: No Arrhythmia, Coronary Artery Disease, Hypertension, Type II Diabetes, Osteoarthritis, Neuropathy Photos Wound Measurements Length: (cm) 4.5 % Reduction i Width: (cm) 2.5 % Reduction i Depth: (cm) 0.2 Epithelializa Area: (cm) 8.836 Tunneling: Volume: (cm) 1.767 Undermining: n Area: -75.8% n Volume: -251.3% tion: Small (1-33%) No No Wound Description Full Thickness Without Foul Odor Aft Classification: Exposed Support Structures Slough/Fibrin Diabetic Severity Grade 1 (Wagner): Wound Margin: Flat and Intact Exudate Amount: Large Exudate Type: Serous Exudate Color: amber er Cleansing: No o Yes Wound Bed Granulation Amount: Large (67-100%) Exposed Structure Granulation Quality: Red, Hyper-granulation Fascia Exposed: No Benjamin Soto ([id number]) Necrotic Amount: Small (1-33%) Fat Layer (Subcutaneous Tissue) Exposed: Yes Necrotic Quality: Adherent Slough Tendon Exposed: No Muscle Exposed:  No Joint Exposed: No Bone Exposed: No Periwound Skin Texture Texture Color No Abnormalities Noted: No No Abnormalities Noted: No Callus: No Atrophie Blanche: No Crepitus: No Cyanosis: No Excoriation: Yes Ecchymosis: Yes Induration: Yes Erythema: No Rash: No Hemosiderin Staining: No Scarring: No Mottled: No Pallor: No Moisture Rubor: No No Abnormalities Noted: No Dry / Scaly: No Temperature / Pain Maceration: No Temperature: No Abnormality Tenderness on Palpation: Yes Wound Preparation Ulcer Cleansing: Rinsed/Irrigated with Saline Topical Anesthetic Applied: Other: lidociane 4%, Treatment Notes Wound #1 (Left, Midline Lower Leg) 1. Cleansed with: Cleanse wound with antibacterial soap and water 3. Peri-wound Care: Barrier cream 4. Dressing Applied: Aquacel 6. Footwear/Offloading device applied Surgical shoe 7. Secured with 3 Layer Compression System - Left Lower Extremity Notes [place] Signature(s) Signed: 03/25/2017 3:33:51 PM By: Benjamin Soto, BSN, Soto, CWS, Benjamin Soto, BSN Entered By: Benjamin Soto, BSN, Soto, CWS, Benjamin on 03/25/2017 15:05:01

## 2017-03-25 NOTE — Progress Notes (Signed)
BP 118/62 (BP Location: Right Arm, Patient Position: Sitting, Cuff Size: Normal)   Pulse 78   Temp 98.2 F (36.8 C) (Oral)   Wt 185 lb 4 oz (84 kg)   SpO2 98%   BMI 27.36 kg/m    CC: fall on coumadin Subjective:    Patient ID: Benjamin Soto, male    DOB: 1936-02-21, 81 y.o.   MRN: [id number]  HPI: Auther Lyerly is a 81 y.o. male presenting on 03/25/2017 for Fall (wants to come off of coumdin)   Dr Josefine Class patient new to me comes in after having another fall yesterday - tripped while walking in from garage while carrying soda and tomatoes - tripped on post-op shoe he wears after bunion surgery. L forearm abrasions.   Had fall 2 months ago and injured L lower leg - followed by wound clinic.  Denies dizziness, lightheadedness, unsteady or imbalance. Both falls have been mechanical.  He may be interested in coming off coumadin.   He did finish antibiotics last week Tuesday.   Known permanent atrial fibrillation since 2004, anticoagulated on coumadin.  Lab Results  Component Value Date   INR 2.7 03/25/2017   INR 1.4 03/16/2017   INR 2.5 03/11/2017  Last INR was subtherapeutic.  Lab Results  Component Value Date   CREATININE 1.31 05/05/2016  GFR 56.23   Relevant past medical, surgical, family and social history reviewed and updated as indicated. Interim medical history since our last visit reviewed. Allergies and medications reviewed and updated. Outpatient Medications Prior to Visit  Medication Sig Dispense Refill  . albuterol (PROVENTIL HFA;VENTOLIN HFA) 108 (90 Base) MCG/ACT inhaler Inhale 2 puffs into the lungs every 6 (six) hours as needed for wheezing. 1 Inhaler 1  . allopurinol (ZYLOPRIM) 100 MG tablet TAKE 1 TABLET BY MOUTH EVERY DAY 90 tablet 3  . allopurinol (ZYLOPRIM) 300 MG tablet TAKE 1 TABLET BY MOUTH EVERY DAY 90 tablet 3  . brimonidine (ALPHAGAN) 0.2 % ophthalmic solution Place into both eyes 2 (two) times daily.    . budesonide-formoterol (SYMBICORT)  160-4.5 MCG/ACT inhaler Inhale 2 puffs into the lungs 2 (two) times daily. Rinse after use. 1 Inhaler 3  . carvedilol (COREG) 12.5 MG tablet TAKE 1 TABLET (12.5 MG TOTAL) BY MOUTH 2 (TWO) TIMES DAILY WITH A MEAL. 180 tablet 3  . cephALEXin (KEFLEX) 500 MG capsule Take 1 capsule (500 mg total) by mouth 3 (three) times daily. (Patient taking differently: Take 500 mg by mouth daily. ) 21 capsule 0  . furosemide (LASIX) 40 MG tablet TAKE 1/2 TABLET BY MOUTH EVERY DAY AS NEEDED. 90 tablet 0  . glucose blood (ONE TOUCH TEST STRIPS) test strip Use once daily 100 each 3  . ipratropium (ATROVENT) 0.06 % nasal spray INHALE 2 SPRAYS IN EACH NOSTRIL DAILY 15 mL 5  . latanoprost (XALATAN) 0.005 % ophthalmic solution Place 1 drop into both eyes at bedtime.    Marland Kitchen losartan (COZAAR) 25 MG tablet TAKE 1 TABLET BY MOUTH ONCE DAILY 90 tablet 2  . metFORMIN (GLUCOPHAGE) 500 MG tablet TAKE 1 TABLET BY MOUTH ONCE DAILY WITH BREAKFAST 90 tablet 0  . simvastatin (ZOCOR) 20 MG tablet TAKE 1 TABLET BY MOUTH ONCE DAILY 90 tablet 1  . warfarin (COUMADIN) 5 MG tablet TAKE 1/2-1 TABLET BY MOUTH DAILY AS DIRECTED 100 tablet 0   No facility-administered medications prior to visit.      Per HPI unless specifically indicated in ROS section below Review of Systems  Objective:    BP 118/62 (BP Location: Right Arm, Patient Position: Sitting, Cuff Size: Normal)   Pulse 78   Temp 98.2 F (36.8 C) (Oral)   Wt 185 lb 4 oz (84 kg)   SpO2 98%   BMI 27.36 kg/m   Wt Readings from Last 3 Encounters:  03/25/17 185 lb 4 oz (84 kg)  02/05/17 189 lb 8 oz (86 kg)  10/14/16 189 lb 8 oz (86 kg)    Physical Exam  Constitutional: He appears well-developed and well-nourished. No distress.  Musculoskeletal: He exhibits no edema.  Skin: Skin is warm and dry. No rash noted. No erythema.  L lateral elbow fresh skin tear - dressed with abx ointment and bandage L distal forearm healing scabs from abrasions  Nursing note and vitals  reviewed.  Results for orders placed or performed in visit on 03/25/17  POCT INR  Result Value Ref Range   INR 2.7       Assessment & Plan:   Problem List Items Addressed This Visit    Atrial fibrillation, chronic (HCC)    Pt weary of continued anticoagulant in setting of recent falls, however no premonitory sxs, both have been mechanical. Discussed stroke risk with chronic afib - rec continue anticoagulant for now, may further discuss options with PCP at next visit.  INR checked, therapeutic, rec continue current regimen, recheck at next scheduled coumadin clinic 04/06/17.       Skin tear of elbow without complication, initial encounter - Primary    L elbow - dressed with triple abx and bandage. Home care reviewed. Keep wound clinic f/u for L leg wound.        Other Visit Diagnoses    Long term current use of anticoagulant therapy       Encounter for therapeutic drug monitoring           Follow up plan: Return in about 2 weeks (around 04/08/2017) for coumadin clinic.  Ria Bush, MD

## 2017-03-25 NOTE — Progress Notes (Signed)
Benjamin, Soto ([id number]) Visit Report for 03/23/2017 Arrival Information Details Patient Name: Benjamin Soto, Benjamin Soto Date of Service: 03/23/2017 1:30 PM Medical Record Number: [id number] Patient Account Number: [id number] Date of Birth/Sex: [birth year] (81 y.o. Male) Treating RN: Benjamin Soto Primary Care Benjamin Soto: Benjamin Soto Other Clinician: Referring Benjamin Soto: Benjamin Soto Treating Benjamin Soto/Extender: Benjamin Soto in Treatment: 6 Visit Information History Since Last Visit Added or deleted any medications: No Patient Arrived: Ambulatory Any new allergies or adverse reactions: No Arrival Time: 13:28 Had a fall or experienced change in No Accompanied By: wife activities of daily living that may affect Transfer Assistance: None risk of falls: Patient Identification Verified: Yes Signs or symptoms of abuse/neglect since last No Secondary Verification Process Yes visito Completed: Hospitalized since last visit: No Patient Has Alerts: Yes Has Dressing in Place as Prescribed: Yes Patient Alerts: Patient on Blood Pain Present Now: Yes Thinner warfarin Type II Diabetic Electronic Signature(s) Signed: 03/23/2017 3:55:06 PM By: Benjamin Soto, BSN, RN, CWS, Kim RN, BSN Entered By: Benjamin Soto, BSN, RN, CWS, Benjamin Soto on 03/23/2017 13:32:12 Benjamin Soto ([id number]) -------------------------------------------------------------------------------- Compression Therapy Details Patient Name: Benjamin Soto Date of Service: 03/23/2017 1:30 PM Medical Record Number: [id number] Patient Account Number: [id number] Date of Birth/Sex: 09-01-1936 (80 y.o. Male) Treating RN: Benjamin Soto Primary Care Kynadi Dragos: Benjamin Soto Other Clinician: Referring Benjamin Soto: Benjamin Soto Treating Benjamin Soto/Extender: Benjamin Soto Weeks in Treatment: 6 Compression Therapy Performed for Wound Wound #1 Left,Midline Lower Leg Assessment: Performed By: Clinician Benjamin Barman, RN Compression Type: Three Layer Pre Treatment  ABI: 1 Post Procedure Diagnosis Same as Pre-procedure Electronic Signature(s) Signed: 03/23/2017 3:55:06 PM By: Benjamin Soto, BSN, RN, CWS, Kim RN, BSN Entered By: Benjamin Soto, BSN, RN, CWS, Benjamin Soto on 03/23/2017 14:22:52 Benjamin Soto ([id number]) -------------------------------------------------------------------------------- Encounter Discharge Information Details Patient Name: Benjamin Soto Date of Service: 03/23/2017 1:30 PM Medical Record Number: [id number] Patient Account Number: [id number] Date of Birth/Sex: March 13, 1936 (80 y.o. Male) Treating RN: Benjamin Soto Primary Care Benjamin Soto: Benjamin Soto Other Clinician: Referring Benjamin Soto: Benjamin Soto Treating Benjamin Soto/Extender: Benjamin Soto in Treatment: 6 Encounter Discharge Information Items Discharge Pain Level: 0 Discharge Condition: Stable Ambulatory Status: Ambulatory Discharge Destination: Home Transportation: Private Auto Accompanied By: wife Schedule Follow-up Appointment: Yes Medication Reconciliation completed and provided to Patient/Care Yes Benjamin Soto: Provided on Clinical Summary of Care: 03/23/2017 Form Type Recipient Paper Patient HD Electronic Signature(s) Signed: 03/23/2017 2:27:22 PM By: Benjamin Soto Entered By: Benjamin Soto on 03/23/2017 14:27:22 Benjamin Soto ([id number]) -------------------------------------------------------------------------------- Lower Extremity Assessment Details Patient Name: Benjamin Soto Date of Service: 03/23/2017 1:30 PM Medical Record Number: [id number] Patient Account Number: [id number] Date of Birth/Sex: Feb 16, 1936 (80 y.o. Male) Treating RN: Benjamin Soto Primary Care Benjamin Soto: Benjamin Soto Other Clinician: Referring Micheline Markes: Benjamin Soto Treating Benjamin Soto/Extender: Benjamin Soto Weeks in Treatment: 6 Edema Assessment Assessed: [Left: No] [Right: No] Edema: [Left: Ye] [Right: s] Calf Left: Right: Point of Measurement: 36 cm From Medial Instep 37.5 cm  cm Ankle Left: Right: Point of Measurement: 10 cm From Medial Instep 26 cm cm Vascular Assessment Pulses: Dorsalis Pedis Palpable: [Left:Yes] Posterior Tibial Extremity colors, hair growth, and conditions: Extremity Color: [Left:Hyperpigmented] Hair Growth on Extremity: [Left:Yes] Temperature of Extremity: [Left:Warm] Capillary Refill: [Left:< 3 seconds] Dependent Rubor: [Left:No] Blanched when Elevated: [Left:No] Lipodermatosclerosis: [Left:No] Toe Nail Assessment Left: Right: Thick: No Discolored: No Deformed: No Improper Length and Hygiene: No Electronic Signature(s) Signed: 03/23/2017 3:55:06 PM By: Benjamin Soto, BSN, RN, CWS, Kim RN, BSN Benjamin Soto, Benjamin Soto ([id number]) Entered By: Benjamin Soto, BSN, RN, CWS, Benjamin Soto on 03/23/2017 13:38:37 Benjamin Soto ([id number]) --------------------------------------------------------------------------------  Multi Wound Chart Details Patient Name: Benjamin, Soto Date of Service: 03/23/2017 1:30 PM Medical Record Number: [id number] Patient Account Number: [id number] Date of Birth/Sex: 01-Dec-1935 (81 y.o. Male) Treating RN: Benjamin Soto Primary Care Benjamin Soto: Benjamin Soto Other Clinician: Referring Benjamin Soto: Benjamin Soto Treating Benjamin Soto/Extender: Benjamin Soto Weeks in Treatment: 6 Vital Signs Height(in): 69 Pulse(bpm): 79 Weight(lbs): 185 Blood Pressure 117/77 (mmHg): Body Mass Index(BMI): 27 Temperature(F): 98.2 Respiratory Rate 16 (breaths/min): Photos: [N/A:N/A] Wound Location: Left Lower Leg - Midline N/A N/A Wounding Event: Trauma N/A N/A Primary Etiology: Trauma, Other N/A N/A Comorbid History: Glaucoma, Chronic N/A N/A Obstructive Pulmonary Disease (COPD), Arrhythmia, Coronary Artery Disease, Hypertension, Type II Diabetes, Osteoarthritis, Neuropathy Date Acquired: 01/23/2017 N/A N/A Weeks of Treatment: 6 N/A N/A Wound Status: Open N/A N/A Measurements L x W x D 4.8x3x0.2 N/A N/A (cm) Area (cm) : 11.31 N/A N/A Volume  (cm) : 2.262 N/A N/A % Reduction in Area: -125.00% N/A N/A % Reduction in Volume: -349.70% N/A N/A Classification: Full Thickness Without N/A N/A Exposed Support Structures HBO Classification: Grade 1 N/A N/A Exudate Amount: Large N/A N/A Benjamin Soto, Benjamin Soto ([id number]) Exudate Type: Serous N/A N/A Exudate Color: amber N/A N/A Wound Margin: Flat and Intact N/A N/A Granulation Amount: Large (67-100%) N/A N/A Granulation Quality: Red, Hyper-granulation N/A N/A Necrotic Amount: Small (1-33%) N/A N/A Exposed Structures: Fat Layer (Subcutaneous N/A N/A Tissue) Exposed: Yes Fascia: No Tendon: No Muscle: No Joint: No Bone: No Epithelialization: Small (1-33%) N/A N/A Periwound Skin Texture: Excoriation: Yes N/A N/A Induration: No Callus: No Crepitus: No Rash: No Scarring: No Periwound Skin Maceration: Yes N/A N/A Moisture: Dry/Scaly: No Periwound Skin Color: Ecchymosis: Yes N/A N/A Atrophie Blanche: No Cyanosis: No Erythema: No Hemosiderin Staining: No Mottled: No Pallor: No Rubor: No Temperature: No Abnormality N/A N/A Tenderness on Yes N/A N/A Palpation: Wound Preparation: Ulcer Cleansing: N/A N/A Rinsed/Irrigated with Saline Topical Anesthetic Applied: Other: lidociane 4% Procedures Performed: Compression Therapy N/A N/A Treatment Notes Wound #1 (Left, Midline Lower Leg) 1. Cleansed with: Clean wound with Normal Saline 2. Anesthetic Liquid 4% Topical Lidocaine Solution 3. Peri-wound Care: Benjamin Soto, Benjamin Soto ([id number]) Barrier cream 4. Dressing Applied: Aquacel 7. Secured with 3 Layer Compression System - Left Lower Extremity Notes [place] Signature(s) Signed: 03/23/2017 5:35:48 PM By: Linton Ham MD Previous Signature: 03/23/2017 3:55:06 PM Version By: Benjamin Soto, BSN, RN, CWS, Kim RN, BSN Entered By: Linton Ham on 03/23/2017 16:53:43 Benjamin Soto  ([id number]) -------------------------------------------------------------------------------- Multi-Disciplinary Care Plan Details Patient Name: Benjamin Soto Date of Service: 03/23/2017 1:30 PM Medical Record Number: [id number] Patient Account Number: [id number] Date of Birth/Sex: [birth year] (81 y.o. Male) Treating RN: Benjamin Soto Primary Care Donovon Micheletti: Benjamin Soto Other Clinician: Referring Nathalya Wolanski: Benjamin Soto Treating Shmiel Morton/Extender: Benjamin Soto in Treatment: 6 Active Inactive ` Abuse / Safety / Falls / Self Care Management Nursing Diagnoses: Potential for falls Goals: Patient will not experience any injury related to falls Date Initiated: 02/09/2017 Target Resolution Date: 04/23/2017 Goal Status: Active Interventions: Assess fall risk on admission and as needed Notes: ` Orientation to the Wound Care Program Nursing Diagnoses: Knowledge deficit related to the wound healing center program Goals: Patient/caregiver will verbalize understanding of the [place] Program Date Initiated: 02/09/2017 Target Resolution Date: 04/23/2017 Goal Status: Active Interventions: Provide education on orientation to the wound center Notes: ` Wound/Skin Impairment Nursing Diagnoses: Impaired tissue integrity Benjamin Soto, Benjamin Soto ([id number]) Goals: Patient/caregiver will verbalize understanding of skin care regimen Date Initiated: 02/09/2017 Target Resolution Date: 04/23/2017 Goal Status: Active Ulcer/skin  breakdown will have a volume reduction of 30% by week 4 Date Initiated: 02/09/2017 Target Resolution Date: 04/23/2017 Goal Status: Active Ulcer/skin breakdown will have a volume reduction of 50% by week 8 Date Initiated: 02/09/2017 Target Resolution Date: 04/23/2017 Goal Status: Active Ulcer/skin breakdown will have a volume reduction of 80% by week 12 Date Initiated: 02/09/2017 Target Resolution Date: 04/23/2017 Goal Status: Active Ulcer/skin breakdown will heal  within 14 weeks Date Initiated: 02/09/2017 Target Resolution Date: 04/23/2017 Goal Status: Active Interventions: Assess patient/caregiver ability to obtain necessary supplies Assess patient/caregiver ability to perform ulcer/skin care regimen upon admission and as needed Assess ulceration(s) every visit Notes: Electronic Signature(s) Signed: 03/23/2017 3:55:06 PM By: Benjamin Soto, BSN, RN, CWS, Kim RN, BSN Entered By: Benjamin Soto, BSN, RN, CWS, Benjamin Soto on 03/23/2017 13:39:48 Benjamin Soto ([id number]) -------------------------------------------------------------------------------- Pain Assessment Details Patient Name: Benjamin Soto Date of Service: 03/23/2017 1:30 PM Medical Record Number: [id number] Patient Account Number: [id number] Date of Birth/Sex: 03/22/1936 (80 y.o. Male) Treating RN: Benjamin Soto Primary Care Jacorey Donaway: Benjamin Soto Other Clinician: Referring Keyna Blizard: Benjamin Soto Treating Jayesh Marbach/Extender: Benjamin Soto Weeks in Treatment: 6 Active Problems Location of Pain Severity and Description of Pain Patient Has Paino Yes Site Locations Pain Location: Pain in Ulcers With Dressing Change: Yes Rate the pain. Current Pain Level: 4 Worst Pain Level: 8 Character of Pain Describe the Pain: Exhausting, Throbbing Pain Management and Medication Current Pain Management: Goals for Pain Management Topical or injectable lidocaine is offered to patient for acute pain when surgical debridement is performed. If needed, Patient is instructed to use over the counter pain medication for the following 24-48 hours after debridement. Wound care MDs do not prescribed pain medications. Patient has chronic pain or uncontrolled pain. Patient has been instructed to make an appointment with their Primary Care Physician for pain management. Electronic Signature(s) Signed: 03/23/2017 3:55:06 PM By: Benjamin Soto, BSN, RN, CWS, Kim RN, BSN Entered By: Benjamin Soto, BSN, RN, CWS, Benjamin Soto on 03/23/2017 13:33:23 Benjamin Soto ([id number]) -------------------------------------------------------------------------------- Patient/Caregiver Education Details Patient Name: Benjamin Soto Date of Service: 03/23/2017 1:30 PM Medical Record Number: [id number] Patient Account Number: [id number] Date of Birth/Gender: Dec 27, 1935 (80 y.o. Male) Treating RN: Benjamin Soto Primary Care Physician: Benjamin Soto Other Clinician: Referring Physician: Elsie Soto Treating Physician/Extender: Benjamin Soto in Treatment: 6 Education Assessment Education Provided To: Patient Education Topics Provided Venous: Handouts: Controlling Swelling with Multilayered Compression Wraps Methods: Demonstration, Explain/Verbal Responses: State content correctly Electronic Signature(s) Signed: 03/23/2017 3:55:06 PM By: Benjamin Soto, BSN, RN, CWS, Kim RN, BSN Entered By: Benjamin Soto, BSN, RN, CWS, Benjamin Soto on 03/23/2017 14:26:30 Benjamin Soto ([id number]) -------------------------------------------------------------------------------- Wound Assessment Details Patient Name: Benjamin Soto Date of Service: 03/23/2017 1:30 PM Medical Record Number: [id number] Patient Account Number: [id number] Date of Birth/Sex: 1936-01-29 (80 y.o. Male) Treating RN: Benjamin Soto Primary Care Zainah Steven: Benjamin Soto Other Clinician: Referring Kelin Borum: Benjamin Soto Treating Arnez Stoneking/Extender: Benjamin Soto Weeks in Treatment: 6 Wound Status Wound Number: 1 Primary Trauma, Other Etiology: Wound Location: Left Lower Leg - Midline Wound Open Wounding Event: Trauma Status: Date Acquired: 01/23/2017 Comorbid Glaucoma, Chronic Obstructive Weeks Of Treatment: 6 History: Pulmonary Disease (COPD), Arrhythmia, Clustered Wound: No Coronary Artery Disease, Hypertension, Type II Diabetes, Osteoarthritis, Neuropathy Photos Wound Measurements Length: (cm) 4.8 % Reduction i Width: (cm) 3 % Reduction i Depth: (cm) 0.2 Epithelializa Area: (cm)  11.31 Tunneling: Volume: (cm) 2.262 Undermining: n Area: -125% n Volume: -349.7% tion: Small (1-33%) No No Wound Description Full Thickness Without Foul Odor Af Classification: Exposed Support Structures Slough/Fibri Diabetic Severity Grade  1 (Wagner): Wound Margin: Flat and Intact Exudate Amount: Large Exudate Type: Serous Exudate Color: amber ter Cleansing: No no Yes Wound Bed Granulation Amount: Large (67-100%) Exposed Structure Granulation Quality: Red, Hyper-granulation Fascia Exposed: No Benjamin Soto, Benjamin Soto ([id number]) Necrotic Amount: Small (1-33%) Fat Layer (Subcutaneous Tissue) Exposed: Yes Necrotic Quality: Adherent Slough Tendon Exposed: No Muscle Exposed: No Joint Exposed: No Bone Exposed: No Periwound Skin Texture Texture Color No Abnormalities Noted: No No Abnormalities Noted: No Callus: No Atrophie Blanche: No Crepitus: No Cyanosis: No Excoriation: Yes Ecchymosis: Yes Induration: No Erythema: No Rash: No Hemosiderin Staining: No Scarring: No Mottled: No Pallor: No Moisture Rubor: No No Abnormalities Noted: No Dry / Scaly: No Temperature / Pain Maceration: Yes Temperature: No Abnormality Tenderness on Palpation: Yes Wound Preparation Ulcer Cleansing: Rinsed/Irrigated with Saline Topical Anesthetic Applied: Other: lidociane 4%, Treatment Notes Wound #1 (Left, Midline Lower Leg) 1. Cleansed with: Clean wound with Normal Saline 2. Anesthetic Liquid 4% Topical Lidocaine Solution 3. Peri-wound Care: Barrier cream 4. Dressing Applied: Aquacel 7. Secured with 3 Layer Compression System - Left Lower Extremity Notes [place] Signature(s) Signed: 03/23/2017 3:55:06 PM By: Benjamin Soto, BSN, RN, CWS, Kim RN, BSN Entered By: Benjamin Soto, BSN, RN, CWS, Benjamin Soto on 03/23/2017 13:36:12 Benjamin Soto ([id number]) -------------------------------------------------------------------------------- Vitals Details Patient Name: Benjamin Soto Date of Service: 03/23/2017 1:30 PM Medical Record Number: [id number] Patient Account Number: [id number] Date of Birth/Sex: October 15, 1935 (80 y.o. Male) Treating RN: Benjamin Soto Primary Care Morrison Mcbryar: Benjamin Soto Other Clinician: Referring Deavion Strider: Benjamin Soto Treating Juliette Standre/Extender: Benjamin Soto Weeks in Treatment: 6 Vital Signs Time Taken: 13:33 Temperature (F): 98.2 Height (in): 69 Pulse (bpm): 79 Weight (lbs): 185 Respiratory Rate (breaths/min): 16 Body Mass Index (BMI): 27.3 Blood Pressure (mmHg): 117/77 Reference Range: 80 - 120 mg / dl Electronic Signature(s) Signed: 03/23/2017 3:55:06 PM By: Benjamin Soto, BSN, RN, CWS, Kim RN, BSN Entered By: Benjamin Soto, BSN, RN, CWS, Benjamin Soto on 03/23/2017 13:33:48

## 2017-03-25 NOTE — Assessment & Plan Note (Addendum)
Pt weary of continued anticoagulant in setting of recent falls, however no premonitory sxs, both have been mechanical. Discussed stroke risk with chronic afib - rec continue anticoagulant for now, may further discuss options with PCP at next visit.  INR checked, therapeutic, rec continue current regimen, recheck at next scheduled coumadin clinic 04/06/17.

## 2017-03-25 NOTE — Patient Instructions (Addendum)
Continue current dose of coumadin 5mg  daily, 2.5mg  on saturdays.  Recheck levels in 2 weeks.  I will let Dr Damita Dunnings know of the latest fall.  Continue seeing coumadin clinic. Let us know if any further falls.

## 2017-03-28 ENCOUNTER — Other Ambulatory Visit: Payer: Self-pay | Admitting: Family Medicine

## 2017-03-29 ENCOUNTER — Ambulatory Visit: Payer: Medicare Other | Admitting: Family Medicine

## 2017-03-30 DIAGNOSIS — H2513 Age-related nuclear cataract, bilateral: Secondary | ICD-10-CM | POA: Diagnosis not present

## 2017-03-30 DIAGNOSIS — H2511 Age-related nuclear cataract, right eye: Secondary | ICD-10-CM | POA: Diagnosis not present

## 2017-03-31 ENCOUNTER — Other Ambulatory Visit
Admission: RE | Admit: 2017-03-31 | Discharge: 2017-03-31 | Disposition: A | Discharge status: Home / Self Care | Payer: Medicare Other | Source: Ambulatory Visit | Attending: Physician Assistant | Admitting: Physician Assistant

## 2017-03-31 ENCOUNTER — Encounter: Payer: Medicare Other | Admitting: Physician Assistant

## 2017-03-31 DIAGNOSIS — I130 Hypertensive heart and chronic kidney disease with heart failure and stage 1 through stage 4 chronic kidney disease, or unspecified chronic kidney disease: Secondary | ICD-10-CM | POA: Diagnosis not present

## 2017-03-31 DIAGNOSIS — B999 Unspecified infectious disease: Secondary | ICD-10-CM | POA: Diagnosis not present

## 2017-03-31 DIAGNOSIS — Z7901 Long term (current) use of anticoagulants: Secondary | ICD-10-CM | POA: Diagnosis not present

## 2017-03-31 DIAGNOSIS — H409 Unspecified glaucoma: Secondary | ICD-10-CM | POA: Diagnosis not present

## 2017-03-31 DIAGNOSIS — S81812D Laceration without foreign body, left lower leg, subsequent encounter: Secondary | ICD-10-CM | POA: Diagnosis not present

## 2017-03-31 DIAGNOSIS — I4891 Unspecified atrial fibrillation: Secondary | ICD-10-CM | POA: Diagnosis not present

## 2017-03-31 DIAGNOSIS — M1990 Unspecified osteoarthritis, unspecified site: Secondary | ICD-10-CM | POA: Diagnosis not present

## 2017-03-31 DIAGNOSIS — E1122 Type 2 diabetes mellitus with diabetic chronic kidney disease: Secondary | ICD-10-CM | POA: Diagnosis not present

## 2017-03-31 DIAGNOSIS — J449 Chronic obstructive pulmonary disease, unspecified: Secondary | ICD-10-CM | POA: Diagnosis not present

## 2017-03-31 DIAGNOSIS — E11610 Type 2 diabetes mellitus with diabetic neuropathic arthropathy: Secondary | ICD-10-CM | POA: Diagnosis not present

## 2017-03-31 DIAGNOSIS — N189 Chronic kidney disease, unspecified: Secondary | ICD-10-CM | POA: Diagnosis not present

## 2017-03-31 DIAGNOSIS — L97223 Non-pressure chronic ulcer of left calf with necrosis of muscle: Secondary | ICD-10-CM | POA: Diagnosis not present

## 2017-03-31 DIAGNOSIS — S81802A Unspecified open wound, left lower leg, initial encounter: Secondary | ICD-10-CM | POA: Diagnosis not present

## 2017-03-31 DIAGNOSIS — I509 Heart failure, unspecified: Secondary | ICD-10-CM | POA: Diagnosis not present

## 2017-04-01 ENCOUNTER — Telehealth: Payer: Self-pay | Admitting: Cardiovascular Disease

## 2017-04-01 ENCOUNTER — Ambulatory Visit (INDEPENDENT_AMBULATORY_CARE_PROVIDER_SITE_OTHER): Payer: Medicare Other | Admitting: Family Medicine

## 2017-04-01 ENCOUNTER — Encounter: Payer: Self-pay | Admitting: Family Medicine

## 2017-04-01 VITALS — BP 102/56 | HR 79 | Temp 97.6°F | Wt 185.5 lb

## 2017-04-01 DIAGNOSIS — E118 Type 2 diabetes mellitus with unspecified complications: Secondary | ICD-10-CM

## 2017-04-01 DIAGNOSIS — I482 Chronic atrial fibrillation, unspecified: Secondary | ICD-10-CM

## 2017-04-01 DIAGNOSIS — Z7901 Long term (current) use of anticoagulants: Secondary | ICD-10-CM | POA: Diagnosis not present

## 2017-04-01 DIAGNOSIS — E119 Type 2 diabetes mellitus without complications: Secondary | ICD-10-CM | POA: Diagnosis not present

## 2017-04-01 LAB — COMPREHENSIVE METABOLIC PANEL
ALT: 13 U/L (ref 0–53)
AST: 16 U/L (ref 0–37)
Albumin: 3.7 g/dL (ref 3.5–5.2)
Alkaline Phosphatase: 72 U/L (ref 39–117)
BUN: 22 mg/dL (ref 6–23)
CO2: 32 mEq/L (ref 19–32)
Calcium: 9.2 mg/dL (ref 8.4–10.5)
Chloride: 93 mEq/L — ABNORMAL LOW (ref 96–112)
Creatinine, Ser: 1.42 mg/dL (ref 0.40–1.50)
GFR: 51.12 mL/min — ABNORMAL LOW (ref >60.00)
Glucose, Bld: 173 mg/dL — ABNORMAL HIGH (ref 70–99)
Potassium: 4.8 mEq/L (ref 3.5–5.1)
Sodium: 130 mEq/L — ABNORMAL LOW (ref 135–145)
Total Bilirubin: 0.5 mg/dL (ref 0.2–1.2)
Total Protein: 6.1 g/dL (ref 6.0–8.3)

## 2017-04-01 LAB — CBC WITH DIFFERENTIAL/PLATELET
Basophils Absolute: 0 10*3/uL (ref 0.0–0.1)
Basophils Relative: 0.4 % (ref 0.0–3.0)
Eosinophils Absolute: 0.1 10*3/uL (ref 0.0–0.7)
Eosinophils Relative: 1.5 % (ref 0.0–5.0)
HCT: 40 % (ref 39.0–52.0)
Hemoglobin: 13.1 g/dL (ref 13.0–17.0)
Lymphocytes Relative: 18.5 % (ref 12.0–46.0)
Lymphs Abs: 0.9 10*3/uL (ref 0.7–4.0)
MCHC: 32.7 g/dL (ref 30.0–36.0)
MCV: 95.7 fl (ref 78.0–100.0)
Monocytes Absolute: 0.4 10*3/uL (ref 0.1–1.0)
Monocytes Relative: 8.8 % (ref 3.0–12.0)
Neutro Abs: 3.6 10*3/uL (ref 1.4–7.7)
Neutrophils Relative %: 70.8 % (ref 43.0–77.0)
Platelets: 180 10*3/uL (ref 150.0–400.0)
RBC: 4.18 Mil/uL — ABNORMAL LOW (ref 4.22–5.81)
RDW: 15.3 % (ref 11.5–15.5)
WBC: 5.1 10*3/uL (ref 4.0–10.5)

## 2017-04-01 LAB — HEMOGLOBIN A1C: Hgb A1c MFr Bld: 5.7 % (ref 4.6–6.5)

## 2017-04-01 LAB — PROTIME-INR
INR: 2.3 ratio — ABNORMAL HIGH (ref 0.8–1.0)
Prothrombin Time: 24.6 s — ABNORMAL HIGH (ref 9.6–13.1)

## 2017-04-01 LAB — POCT INR: INR: 2.3

## 2017-04-01 NOTE — Telephone Encounter (Signed)
Acceptable risk Would see if they can do surgery on warfarin If not, will need to hold the warfarin

## 2017-04-01 NOTE — Patient Instructions (Signed)
Go to the lab on the way out.  We'll contact you with your lab report. Take care.  Glad to see you.  

## 2017-04-01 NOTE — Telephone Encounter (Signed)
Received cardiac clearance request for pt to proceed w/ cataract extraction w/ intraocular lens implantation of the right eye followed by the left eye.  DOS has not been scheduled yet pending this clearance.  Please route to [phone], attn: Sharyn Lull.

## 2017-04-01 NOTE — Progress Notes (Signed)
Diabetes:  Using medications without difficulties: yes Hypoglycemic episodes:no Hyperglycemic episodes:no Feet problems: L foot dressed with wound noted.   Blood Sugars averaging: ~118 on last check  eye exam within last year: done this week, 03/30/17.  No retinopathy.  His wife was at the appointment and she had a lot of questions about diabetes, his condition. Discussed. He asked type 2 diabetes, controlled by last A1c, on low dose of single oral medicine.  On doxy for leg wound with coumadin use concurrently.  D/w pt about adding on vit C.  Leg wound is dressed.  He is seeing the wound clinic. Coumadin dose is 5 mg on 6 days a week and 2.5 mg on Saturday. He has bruising as expected on the extensor surface of his arms.  PMH and SH reviewed  Meds, vitals, and allergies reviewed.   ROS: Per HPI unless specifically indicated in ROS section   GEN: nad, alert and oriented HEENT: mucous membranes moist NECK: supple w/o LA CV: IRR, not tachy PULM: ctab, no inc wob ABD: soft, +bs EXT: no edema SKIN: no acute rash but bruising noted on the arms, on extensor surface. L leg wrapped.

## 2017-04-02 ENCOUNTER — Other Ambulatory Visit: Payer: Self-pay | Admitting: Family Medicine

## 2017-04-02 ENCOUNTER — Ambulatory Visit (INDEPENDENT_AMBULATORY_CARE_PROVIDER_SITE_OTHER): Payer: Medicare Other

## 2017-04-02 ENCOUNTER — Encounter: Payer: Self-pay | Admitting: Family Medicine

## 2017-04-02 DIAGNOSIS — E118 Type 2 diabetes mellitus with unspecified complications: Secondary | ICD-10-CM

## 2017-04-02 NOTE — Progress Notes (Signed)
KARAM, DUNSON ([id number]) Visit Report for 03/31/2017 Chief Complaint Document Details Patient Name: Benjamin Soto, Benjamin Soto Date of Service: 03/31/2017 11:00 AM Medical Record Number: [id number] Patient Account Number: [id number] Date of Birth/Sex: Sep 09, 1936 (81 y.o. Male) Treating RN: Montey Hora Primary Care Provider: Elsie Stain Other Clinician: Referring Provider: Elsie Stain Treating Provider/Extender: Melburn Hake, HOYT Weeks in Treatment: 7 Information Obtained from: Patient Chief Complaint 02/09/17; Patient is here for review of a traumatic wound on the left leg anterior Electronic Signature(s) Signed: 04/01/2017 10:10:39 AM By: Worthy Keeler PA-C Entered By: Worthy Keeler on 03/31/2017 17:51:56 Benjamin Soto ([id number]) -------------------------------------------------------------------------------- HPI Details Patient Name: Benjamin Soto Date of Service: 03/31/2017 11:00 AM Medical Record Number: [id number] Patient Account Number: [id number] Date of Birth/Sex: 03-Mar-1936 (81 y.o. Male) Treating RN: Montey Hora Primary Care Provider: Elsie Stain Other Clinician: Referring Provider: Elsie Stain Treating Provider/Extender: Melburn Hake, HOYT Weeks in Treatment: 7 History of Present Illness HPI Description: 02/09/17; 81 year old man with a hx of type 2 DM, smoker. He suffered a fall 17 days ago while working on the [employer] of his trailer. He didn't realized anything had happed until he returned home. Has been applying polysporin. Received Cephalexin finishes on Saturday. No prior wound history. ABI's 0.96 on the left. No known hx of PAD. Hx of Afib on Coumadin,chf,st 2 crf 02/16/17; patient arrives today with the condition of his wound improved he is on silver alginate. Culture of the divot medially last week grew 2 different types of enterococcus including Enterococcus faecalis. He is on Augmentin. Surface of the wound looks better. Still debridement required  today 02/23/17; traumatic wound on the left anterior leg. He has finished Augmentin. He found debridement last week painful fortunately none was required today 03/02/17; traumatic wound on the left anterior leg. Still 75% necrotic surface material requiring debridement. Most of the rest that it is stable 03/09/17; initially traumatic wound on his left leg. Arrives today with complaints of more pain and more erythema around the wound. States the pain is severe enough that it keeps him awake at night. The original wound surface itself looks good and the divot that was laterally has closed over. We switched him to [facility] last week. Wife shows me a lot of drainage coming out of the wound surface 03/16/17; the patient comes in today with a better-looking wound circumference no erythema. He is completed the doxycycline I gave him. He still complains of a lot of pain which he states radiates into the foot and keeps him awake at night. It seems that the original injury happened when he fell backwards and the anterior leg came up against part of a trailer hitch is the best I'm able to determine. He also hurt his lower coccyx. We have been using silver alginate since last week largely because of the infection 03/23/17; no major change in this man's wounds. He has leaking edema fluid. Surface of the wound looks better we have been using silver alginate. Still complaining of a lot of discomfort 03/31/17 on evaluation today patient appears to be doing worse unfortunately. His wound appears to be infected and is more tender to palpation. Nothing has changed other than he has discontinued his antibiotic at this point. Electronic Signature(s) Signed: 04/01/2017 10:10:39 AM By: Worthy Keeler PA-C Entered By: Worthy Keeler on 03/31/2017 17:52:13 Benjamin Soto, Benjamin Soto ([id number]) -------------------------------------------------------------------------------- Physical Exam Details Patient Name: Benjamin Soto Date of Service: 03/31/2017 11:00 AM Medical Record Number: [id number] Patient Account Number: [id number] Date of  Birth/Sex: 1935/12/13 (81 y.o. Male) Treating RN: Montey Hora Primary Care Provider: Elsie Stain Other Clinician: Referring Provider: Elsie Stain Treating Provider/Extender: STONE III, HOYT Weeks in Treatment: 7 Constitutional Well-nourished and well-hydrated in no acute distress. Respiratory normal breathing without difficulty. clear to auscultation bilaterally. Cardiovascular regular rate and rhythm with normal S1, S2. Psychiatric this patient is able to make decisions and demonstrates good insight into disease process. Alert and Oriented x 3. pleasant and cooperative. Notes Patient has some Slough covering his wounds but more importantly there's a significant amount of erythema surrounding the wound. For that reason I did not perform any debridement at this point. Electronic Signature(s) Signed: 04/01/2017 10:10:39 AM By: Worthy Keeler PA-C Entered By: Worthy Keeler on 03/31/2017 17:52:49 Benjamin Soto ([id number]) -------------------------------------------------------------------------------- Physician Orders Details Patient Name: Benjamin Soto Date of Service: 03/31/2017 11:00 AM Medical Record Number: [id number] Patient Account Number: [id number] Date of Birth/Sex: August 25, 1936 (81 y.o. 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 o Cleanse wound with mild soap and water o Other: - May shower (DO NOT GET WRAP WET) wash with Hibiclens head to toe Anesthetic Wound #1 Left,Midline Lower Leg o Topical Lidocaine 4% cream applied to wound bed prior to debridement Skin Barriers/Peri-Wound Care Wound #1 Left,Midline Lower Leg o Barrier cream Primary Wound Dressing Wound #1 Left,Midline Lower Leg o Aquacel Ag Secondary Dressing Wound #1 Left,Midline Lower Leg o Other - Kerra Max Care Dressing Change Frequency Wound #1 Left,Midline Lower Leg o Change dressing every other day. o Other: - Thursday Nurse Visit Follow-up Appointments Benjamin Soto, Benjamin Soto ([id number]) Wound #1 Left,Midline Lower Leg o Return Appointment in 1 week. o Nurse Visit as needed Edema Control Wound #1 Left,Midline Lower Leg o 3 Layer Compression System - Left Lower Extremity Additional Orders / Instructions Wound #1 Left,Midline Lower Leg o Increase protein intake. o Other: - Please add vitamin A, vitamin C and zinc supplements to your diet Patient Medications Allergies: No Known Allergies Notifications Medication Indication Start End doxycycline hyclate 03/31/2017 DOSE 1 - oral 100 mg capsule - 1 capsule oral every 12 hours x 14 days Notes I am going to initiate therapy regarding medications with doxycycline for the next 14 days. Otherwise I am gonna switching back to the silver alginate dressing per the above wound care orders. We will see were things stand in one week. If anything worsens in the interim he will contact our office for additional recommendations. Electronic Signature(s) Signed: 04/01/2017 10:10:39 AM By: Worthy Keeler PA-C Previous Signature: 03/31/2017 11:21:29 AM Version By: Worthy Keeler PA-C Entered By: Worthy Keeler on 03/31/2017 17:53:42 Benjamin Soto  ([id number]) -------------------------------------------------------------------------------- Problem List Details Patient Name: Benjamin Soto Date of Service: 03/31/2017 11:00 AM Medical Record Number: [id number] Patient Account Number: [id number] Date of Birth/Sex: [birth year] (81 y.o. Male) Treating RN: Montey Hora Primary Care Provider: Elsie Stain Other Clinician: Referring Provider: Elsie Stain Treating Provider/Extender: Melburn Hake, HOYT Weeks in Treatment: 7 Active Problems ICD-10 Encounter Code Description Active Date Diagnosis S81.812D Laceration without foreign body, left lower leg, subsequent 02/09/2017  Yes encounter [phone] Non-pressure chronic ulcer of left calf with necrosis of 02/09/2017 Yes muscle L03.116 Cellulitis of left lower limb 03/09/2017 Yes Inactive Problems Resolved Problems Electronic Signature(s) Signed: 04/01/2017 10:10:39 AM By: Worthy Keeler PA-C Entered By: Worthy Keeler on 03/31/2017 17:51:35 Benjamin Soto ([id number]) -------------------------------------------------------------------------------- Progress Note Details Patient Name: Benjamin Soto Date of Service: 03/31/2017 11:00 AM Medical Record Number: [id number] Patient Account Number: [id number] Date of Birth/Sex: [birth year] (81 y.o. Male) Treating RN: Montey Hora Primary Care Provider: Elsie Stain Other Clinician: Referring Provider: Elsie Stain Treating Provider/Extender: Melburn Hake, HOYT Weeks in Treatment: 7 Subjective Chief Complaint Information obtained from Patient 02/09/17; Patient is here for review of a traumatic wound on the left leg anterior History of Present Illness (HPI) 02/09/17; 81 year old man with a hx of type 2 DM, smoker. He suffered a fall 17 days ago while working on the [employer] of his trailer. He didn't realized anything had happed until he returned home. Has been applying polysporin. Received Cephalexin finishes on Saturday. No prior wound  history. ABI's 0.96 on the left. No known hx of PAD. Hx of Afib on Coumadin,chf,st 2 crf 02/16/17; patient arrives today with the condition of his wound improved he is on silver alginate. Culture of the divot medially last week grew 2 different types of enterococcus including Enterococcus faecalis. He is on Augmentin. Surface of the wound looks better. Still debridement required today 02/23/17; traumatic wound on the left anterior leg. He has finished Augmentin. He found debridement last week painful fortunately none was required today 03/02/17; traumatic wound on the left anterior leg. Still 75% necrotic surface material requiring debridement. Most of the rest that it is stable 03/09/17; initially traumatic wound on his left leg. Arrives today with complaints of more pain and more erythema around the wound. States the pain is severe enough that it keeps him awake at night. The original wound surface itself looks good and the divot that was laterally has closed over. We switched him to [facility] last week. Wife shows me a lot of drainage coming out of the wound surface 03/16/17; the patient comes in today with a better-looking wound circumference no erythema. He is completed the doxycycline I gave him. He still complains of a lot of pain which he states radiates into the foot and keeps him awake at night. It seems that the original injury happened when he fell backwards and the anterior leg came up against part of a trailer hitch is the best I'm able to determine. He also hurt his lower coccyx. We have been using silver alginate since last week largely because of the infection 03/23/17; no major change in this man's wounds. He has leaking edema fluid. Surface of the wound looks better we have been using silver alginate. Still complaining of a lot of discomfort 03/31/17 on evaluation today patient appears to be doing worse unfortunately. His wound appears to be infected and is more tender to  palpation. Nothing has changed other than he has discontinued his antibiotic at this point. Benjamin Soto, Benjamin Soto ([id number]) Objective Constitutional Well-nourished and well-hydrated in no acute distress. Vitals Time Taken: 10:57 AM, Height: 69 in, Weight: 185 lbs, BMI: 27.3, Temperature: 98.0 F, Pulse: 75 bpm, Respiratory Rate: 16 breaths/min, Blood Pressure: 104/60 mmHg. Respiratory normal breathing without difficulty. clear to auscultation bilaterally. Cardiovascular regular rate and rhythm with normal S1, S2. Psychiatric this patient is able to make decisions and demonstrates good insight into disease process. Alert and Oriented x 3. pleasant  and cooperative. General Notes: Patient has some Slough covering his wounds but more importantly there's a significant amount of erythema surrounding the wound. For that reason I did not perform any debridement at this point. Integumentary (Hair, Skin) Wound #1 status is Open. Original cause of wound was Trauma. The wound is located on the Left,Midline Lower Leg. The wound measures 4.9cm length x 2.5cm width x 0.2cm depth; 9.621cm^2 area and 1.924cm^3 volume. There is Fat Layer (Subcutaneous Tissue) Exposed exposed. There is no tunneling or undermining noted. There is a large amount of serosanguineous drainage noted. The wound margin is flat and intact. There is large (67-100%) red granulation within the wound bed. There is a small (1-33%) amount of necrotic tissue within the wound bed including Adherent Slough. The periwound skin appearance exhibited: Excoriation, Induration, Ecchymosis, Erythema. The periwound skin appearance did not exhibit: Callus, Crepitus, Rash, Scarring, Dry/Scaly, Maceration, Atrophie Blanche, Cyanosis, Hemosiderin Staining, Mottled, Pallor, Rubor. The surrounding wound skin color is noted with erythema which is circumferential. Periwound temperature was noted as No Abnormality. The periwound has tenderness  on palpation. 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 Cleanse wound with mild soap and water Other: - May shower (DO NOT GET WRAP WET) wash with Hibiclens head to toe Anesthetic: Wound #1 Left,Midline Lower Leg: Topical Lidocaine 4% cream applied to wound bed prior to debridement Skin Barriers/Peri-Wound Care: Wound #1 Left,Midline Lower Leg: Barrier cream Primary Wound Dressing: Wound #1 Left,Midline Lower Leg: Aquacel Ag Secondary Dressing: Wound #1 Left,Midline Lower Leg: Other - Kerra Max Care Dressing Change Frequency: Wound #1 Left,Midline Lower Leg: Change dressing every other day. Other: - Thursday Nurse Visit Follow-up Appointments: Wound #1 Left,Midline Lower Leg: Return Appointment in 1 week. Nurse Visit as needed Edema Control: Wound #1 Left,Midline Lower Leg: 3 Layer Compression System - Left Lower Extremity Additional Orders / Instructions: Wound #1 Left,Midline Lower Leg: Increase protein intake. Other: - Please add vitamin A, vitamin C and zinc supplements to your diet The following medication(s) was prescribed: doxycycline hyclate oral 100 mg capsule 1 1 capsule oral every 12 hours x 14 days starting 03/31/2017 General Notes: I am going to initiate therapy regarding medications with doxycycline for the next 14 days. Otherwise I am gonna switching back to the silver alginate dressing per the above wound care orders. We will see were things stand in one week. If anything worsens in the interim he will contact our office for additional recommendations. Benjamin Soto, Benjamin Soto ([id number]) Electronic Signature(s) Signed: 04/01/2017 10:10:39 AM By: Worthy Keeler  PA-C Entered By: Worthy Keeler on 03/31/2017 17:53:53 Benjamin Soto ([id number]) -------------------------------------------------------------------------------- SuperBill Details Patient Name: Benjamin Soto Date of Service: 03/31/2017 Medical Record Number: [id number] Patient Account Number: [id number] Date of Birth/Sex: 1936/01/11 (81 y.o. 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 L03.116 Cellulitis of left lower limb Facility Procedures CPT4: Description Modifier Quantity Code [id number] (Facility Use Only) [phone] - [place] [facility] 1 LEG Physician Procedures CPT4 Code Description: [id number] -  WC PHYS LEVEL 3 - EST PT ICD-10 Description Diagnosis S81.812D Laceration without foreign body, left lower leg, L97.223 Non-pressure chronic ulcer of left calf with necr L03.116 Cellulitis of left lower limb Modifier: subsequent enco osis of muscle Quantity: 1 unter Electronic Signature(s) Signed: 04/01/2017 10:10:39 AM By: Worthy Keeler PA-C Entered By: Worthy Keeler on 03/31/2017 17:54:12

## 2017-04-02 NOTE — Patient Instructions (Signed)
Pre visit review using our clinic review tool, if applicable. No additional management support is needed unless otherwise documented below in the visit note. 

## 2017-04-02 NOTE — Assessment & Plan Note (Signed)
Has been controlled, recheck A1c today. Discussed pathophysiology of diabetes with patient and his wife. No change in medications at this point however if his A1c is significantly lower he can likely stop his metformin. >25 minutes spent in face to face time with patient, >50% spent in counselling or coordination of care.

## 2017-04-02 NOTE — Assessment & Plan Note (Signed)
Currently taking 32.5 mg of Coumadin per week.  Recently started on doxycycline. Will notify anticoagulation clinic for them to advise patient about Coumadin dosing and follow-up. Recent INR at goal. I appreciate the help of all involved.

## 2017-04-02 NOTE — Progress Notes (Signed)
Benjamin Soto ([id number]) Visit Report for 03/31/2017 Arrival Information Details Patient Name: Benjamin Soto, Benjamin Soto Date of Service: 03/31/2017 11:00 AM Medical Record Number: [id number] Patient Account Number: [id number] Date of Birth/Sex: 08-Oct-1935 (81 y.o. Male) Treating RN: Ahmed Prima Primary Care Bush Murdoch: Elsie Stain Other Clinician: Referring Romari Gasparro: Elsie Stain Treating Laraya Pestka/Extender: Melburn Hake, HOYT Weeks in Treatment: 7 Visit Information History Since Last Visit All ordered tests and consults were completed: No Patient Arrived: Ambulatory Added or deleted any medications: No Arrival Time: 10:55 Any new allergies or adverse reactions: No Accompanied By: wife Had a fall or experienced change in No Transfer Assistance: None activities of daily living that may affect Patient Identification Verified: Yes risk of falls: Secondary Verification Process Yes Signs or symptoms of abuse/neglect since last No Completed: visito Patient Requires Transmission- No Hospitalized since last visit: No Based Precautions: Has Dressing in Place as Prescribed: Yes Patient Has Alerts: Yes Has Compression in Place as Prescribed: Yes Patient Alerts: Patient on Blood Pain Present Now: Yes Thinner warfarin Type II Diabetic Electronic Signature(s) Signed: 04/01/2017 7:50:03 AM By: Alric Quan Entered By: Alric Quan on 03/31/2017 10:56:06 Benjamin Soto ([id number]) -------------------------------------------------------------------------------- Encounter Discharge Information Details Patient Name: Benjamin Soto Date of Service: 03/31/2017 11:00 AM Medical Record Number: [id number] Patient Account Number: [id number] Date of Birth/Sex: Nov 29, 1935 (81 y.o. Male) Treating RN: Ahmed Prima Primary Care Milea Klink: Elsie Stain Other Clinician: Referring Daquann Merriott: Elsie Stain Treating Jamison Soward/Extender: Melburn Hake, HOYT Weeks in Treatment: 7 Encounter Discharge  Information Items Discharge Pain Level: 8 Discharge Condition: Stable Ambulatory Status: Ambulatory Discharge Destination: Home Transportation: Private Auto Accompanied By: wife Schedule Follow-up Appointment: Yes Medication Reconciliation completed and provided to Patient/Care No Yanelle Sousa: Provided on Clinical Summary of Care: 03/31/2017 Form Type Recipient Paper Patient HD Electronic Signature(s) Signed: 03/31/2017 11:28:53 AM By: Ruthine Dose Entered By: Ruthine Dose on 03/31/2017 11:28:53 Benjamin Soto ([id number]) -------------------------------------------------------------------------------- Lower Extremity Assessment Details Patient Name: Benjamin Soto Date of Service: 03/31/2017 11:00 AM Medical Record Number: [id number] Patient Account Number: [id number] Date of Birth/Sex: Nov 02, 1935 (81 y.o. Male) Treating RN: Ahmed Prima Primary Care Kawehi Hostetter: Elsie Stain Other Clinician: Referring Kawanna Christley: Elsie Stain Treating Tayvon Culley/Extender: Melburn Hake, HOYT Weeks in Treatment: 7 Edema Assessment Assessed: [Left: No] [Right: No] E[Left: dema] [Right: :] Calf Left: Right: Point of Measurement: 36 cm From Medial Instep 35.4 cm cm Ankle Left: Right: Point of Measurement: 10 cm From Medial Instep 23.2 cm cm Vascular Assessment Pulses: Dorsalis Pedis Palpable: [Left:Yes] Posterior Tibial Extremity colors, hair growth, and conditions: Extremity Color: [Left:Normal] Temperature of Extremity: [Left:Warm] Capillary Refill: [Left:< 3 seconds] Toe Nail Assessment Left: Right: Thick: No Discolored: No Deformed: No Improper Length and Hygiene: No Electronic Signature(s) Signed: 04/01/2017 7:50:03 AM By: Alric Quan Entered By: Alric Quan on 03/31/2017 11:01:06 Benjamin Soto ([id number]) -------------------------------------------------------------------------------- Multi Wound Chart Details Patient Name: Benjamin Soto Date of Service:  03/31/2017 11:00 AM Medical Record Number: [id number] Patient Account Number: [id number] Date of Birth/Sex: 1936/09/09 (81 y.o. Male) Treating RN: Ahmed Prima Primary Care Fey Coghill: Elsie Stain Other Clinician: Referring Yarelie Hams: Elsie Stain Treating Harl Wiechmann/Extender: STONE III, HOYT Weeks in Treatment: 7 Vital Signs Height(in): 69 Pulse(bpm): 75 Weight(lbs): 185 Blood Pressure 104/60 (mmHg): Body Mass Index(BMI): 27 Temperature(F): 98.0 Respiratory Rate 16 (breaths/min): Photos: [1:No Photos] [N/A:N/A] Wound Location: [1:Left Lower Leg - Midline] [N/A:N/A] Wounding Event: [1:Trauma] [N/A:N/A] Primary Etiology: [1:Trauma, Other] [N/A:N/A] Comorbid History: [1:Glaucoma, Chronic Obstructive Pulmonary Disease (COPD), Arrhythmia, Coronary Artery Disease, Hypertension, Type II Diabetes, Osteoarthritis, Neuropathy] [N/A:N/A] Date Acquired: [1:01/23/2017] [N/A:N/A] Weeks of Treatment: [1:7] [N/A:N/A]  Wound Status: [1:Open] [N/A:N/A] Measurements L x W x D 4.9x2.5x0.2 [N/A:N/A] (cm) Area (cm) : [1:9.621] [N/A:N/A] Volume (cm) : [1:1.924] [N/A:N/A] % Reduction in Area: [1:-91.40%] [N/A:N/A] % Reduction in Volume: -282.50% [N/A:N/A] Classification: [1:Full Thickness Without Exposed Support Structures] [N/A:N/A] HBO Classification: [1:Grade 1] [N/A:N/A] Exudate Amount: [1:Large] [N/A:N/A] Exudate Type: [1:Serosanguineous] [N/A:N/A] Exudate Color: [1:red, brown] [N/A:N/A] Wound Margin: [1:Flat and Intact] [N/A:N/A] Granulation Amount: [1:Large (67-100%)] [N/A:N/A] Granulation Quality: Red, Hyper-granulation N/A N/A Necrotic Amount: Small (1-33%) N/A N/A Exposed Structures: Fat Layer (Subcutaneous N/A N/A Tissue) Exposed: Yes Fascia: No Tendon: No Muscle: No Joint: No Bone: No Epithelialization: Small (1-33%) N/A N/A Periwound Skin Texture: Excoriation: Yes N/A N/A Induration: Yes Callus: No Crepitus: No Rash: No Scarring: No Periwound Skin Maceration: No N/A  N/A Moisture: Dry/Scaly: No Periwound Skin Color: Ecchymosis: Yes N/A N/A Erythema: Yes Atrophie Blanche: No Cyanosis: No Hemosiderin Staining: No Mottled: No Pallor: No Rubor: No Erythema Location: Circumferential N/A N/A Temperature: No Abnormality N/A N/A Tenderness on Yes N/A N/A Palpation: Wound Preparation: Ulcer Cleansing: N/A N/A Rinsed/Irrigated with Saline Topical Anesthetic Applied: Other: lidociane 4% Treatment Notes Electronic Signature(s) Signed: 04/01/2017 7:50:03 AM By: Alric Quan Entered By: Alric Quan on 03/31/2017 11:01:26 Benjamin Soto ([id number]) -------------------------------------------------------------------------------- Multi-Disciplinary Care Plan Details Patient Name: Benjamin Soto Date of Service: 03/31/2017 11:00 AM Medical Record Number: [id number] Patient Account Number: [id number] Date of Birth/Sex: 12/10/1935 (81 y.o. Male) Treating RN: Ahmed Prima Primary Care Navpreet Szczygiel: Elsie Stain Other Clinician: Referring Leray Garverick: Elsie Stain Treating Jiali Linney/Extender: Melburn Hake, HOYT Weeks in Treatment: 7 Active Inactive ` Abuse / Safety / Falls / Self Care Management Nursing Diagnoses: Potential for falls Goals: Patient will not experience any injury related to falls Date Initiated: 02/09/2017 Target Resolution Date: 04/23/2017 Goal Status: Active Interventions: Assess fall risk on admission and as needed Notes: ` Orientation to the Wound Care Program Nursing Diagnoses: Knowledge deficit related to the wound healing center program Goals: Patient/caregiver will verbalize understanding of the [facility] Date Initiated: 02/09/2017 Target Resolution Date: 04/23/2017 Goal Status: Active Interventions: Provide education on orientation to the wound center Notes: ` Wound/Skin Impairment Nursing Diagnoses: Impaired tissue integrity Benjamin Soto, Benjamin Soto ([id number]) Goals: Patient/caregiver will  verbalize understanding of skin care regimen Date Initiated: 02/09/2017 Target Resolution Date: 04/23/2017 Goal Status: Active Ulcer/skin breakdown will have a volume reduction of 30% by week 4 Date Initiated: 02/09/2017 Target Resolution Date: 04/23/2017 Goal Status: Active Ulcer/skin breakdown will have a volume reduction of 50% by week 8 Date Initiated: 02/09/2017 Target Resolution Date: 04/23/2017 Goal Status: Active Ulcer/skin breakdown will have a volume reduction of 80% by week 12 Date Initiated: 02/09/2017 Target Resolution Date: 04/23/2017 Goal Status: Active Ulcer/skin breakdown will heal within 14 weeks Date Initiated: 02/09/2017 Target Resolution Date: 04/23/2017 Goal Status: Active Interventions: Assess patient/caregiver ability to obtain necessary supplies Assess patient/caregiver ability to perform ulcer/skin care regimen upon admission and as needed Assess ulceration(s) every visit Notes: Electronic Signature(s) Signed: 04/01/2017 7:50:03 AM By: Alric Quan Entered By: Alric Quan on 03/31/2017 11:01:13 Benjamin Soto ([id number]) -------------------------------------------------------------------------------- Pain Assessment Details Patient Name: Benjamin Soto Date of Service: 03/31/2017 11:00 AM Medical Record Number: [id number] Patient Account Number: [id number] Date of Birth/Sex: August 27, 1936 (81 y.o. Male) Treating RN: Ahmed Prima Primary Care Urijah Arko: Elsie Stain Other Clinician: Referring Ahtziry Saathoff: Elsie Stain Treating Ebunoluwa Gernert/Extender: Melburn Hake, HOYT Weeks in Treatment: 7 Active Problems Location of Pain Severity and Description of Pain Patient Has Paino Yes Site Locations Pain Location: Pain in Ulcers With Dressing Change: Yes Rate the  pain. Current Pain Level: 8 Character of Pain Describe the Pain: Stabbing Pain Management and Medication Current Pain Management: Electronic Signature(s) Signed: 04/01/2017 7:50:03 AM By: Alric Quan Entered By: Alric Quan on 03/31/2017 10:56:37 Benjamin Soto ([id number]) -------------------------------------------------------------------------------- Patient/Caregiver Education Details Patient Name: Benjamin Soto Date of Service: 03/31/2017 11:00 AM Medical Record Number: [id number] Patient Account Number: [id number] Date of Birth/Gender: 03-12-1936 (81 y.o. Male) Treating RN: Ahmed Prima Primary Care Physician: Elsie Stain Other Clinician: Referring Physician: Elsie Stain Treating Physician/Extender: Sharalyn Ink in Treatment: 7 Education Assessment Education Provided To: Patient and Caregiver Education Topics Provided Wound/Skin Impairment: Handouts: Other: do not get wrap wet Methods: Demonstration, Explain/Verbal Responses: State content correctly Electronic Signature(s) Signed: 04/01/2017 7:50:03 AM By: Alric Quan Entered By: Alric Quan on 03/31/2017 11:17:45 Benjamin Soto ([id number]) -------------------------------------------------------------------------------- Wound Assessment Details Patient Name: Benjamin Soto Date of Service: 03/31/2017 11:00 AM Medical Record Number: [id number] Patient Account Number: [id number] Date of Birth/Sex: Jan 20, 1936 (81 y.o. Male) Treating RN: Ahmed Prima Primary Care Devi Hopman: Elsie Stain Other Clinician: Referring Janelli Welling: Elsie Stain Treating Tylee Newby/Extender: Melburn Hake, HOYT Weeks in Treatment: 7 Wound Status Wound Number: 1 Primary Trauma, Other Etiology: Wound Location: Left Lower Leg - Midline Wound Open Wounding Event: Trauma Status: Date Acquired: 01/23/2017 Comorbid Glaucoma, Chronic Obstructive Weeks Of Treatment: 7 History: Pulmonary Disease (COPD), Arrhythmia, Clustered Wound: No Coronary Artery Disease, Hypertension, Type II Diabetes, Osteoarthritis, Neuropathy Photos Photo Uploaded By: Alric Quan on 03/31/2017 11:47:05 Wound  Measurements Length: (cm) 4.9 Width: (cm) 2.5 Depth: (cm) 0.2 Area: (cm) 9.621 Volume: (cm) 1.924 % Reduction in Area: -91.4% % Reduction in Volume: -282.5% Epithelialization: Small (1-33%) Tunneling: No Undermining: No Wound Description Full Thickness Without Exposed Foul Odor Af Classification: Support Structures Slough/Fibri Diabetic Severity Grade 1 (Wagner): Wound Margin: Flat and Intact Exudate Amount: Large Exudate Type: Serosanguineous Exudate Color: red, brown Benjamin Soto, Benjamin Soto ([id number]) ter Cleansing: No no Yes Wound Bed Granulation Amount: Large (67-100%) Exposed Structure Granulation Quality: Red, Hyper-granulation Fascia Exposed: No Necrotic Amount: Small (1-33%) Fat Layer (Subcutaneous Tissue) Exposed: Yes Necrotic Quality: Adherent Slough Tendon Exposed: No Muscle Exposed: No Joint Exposed: No Bone Exposed: No Periwound Skin Texture Texture Color No Abnormalities Noted: No No Abnormalities Noted: No Callus: No Atrophie Blanche: No Crepitus: No Cyanosis: No Excoriation: Yes Ecchymosis: Yes Induration: Yes Erythema: Yes Rash: No Erythema Location: Circumferential Scarring: No Hemosiderin Staining: No Mottled: No Moisture Pallor: No No Abnormalities Noted: No Rubor: No Dry / Scaly: No Maceration: No Temperature / Pain Temperature: No Abnormality Tenderness on Palpation: Yes Wound Preparation Ulcer Cleansing: Rinsed/Irrigated with Saline Topical Anesthetic Applied: Other: lidociane 4%, Treatment Notes Wound #1 (Left, Midline Lower Leg) 1. Cleansed with: Clean wound with Normal Saline Cleanse wound with antibacterial soap and water 2. Anesthetic Topical Lidocaine 4% cream to wound bed prior to debridement 3. Peri-wound Care: Barrier cream 4. Dressing Applied: Aquacel Ag 7. Secured with Tape 3 Layer Compression System - Left Lower Extremity Notes [facility] Benjamin Soto, Benjamin Soto ([id number]) Electronic Signature(s) Signed:  04/01/2017 7:50:03 AM By: Alric Quan Entered By: Alric Quan on 03/31/2017 11:00:15 Benjamin Soto ([id number]) -------------------------------------------------------------------------------- [place] Details Patient Name: Benjamin Soto Date of Service: 03/31/2017 11:00 AM Medical Record Number: [id number] Patient Account Number: [id number] Date of Birth/Sex: May 09, 1936 (81 y.o. Male) Treating RN: Ahmed Prima Primary Care Valeska Haislip: Elsie Stain Other Clinician: Referring Latavia Goga: Elsie Stain Treating Hameed Kolar/Extender: Melburn Hake, HOYT Weeks in Treatment: 7 Vital Signs Time Taken: 10:57 Temperature (F): 98.0 Height (in): 69 Pulse (bpm): 75 Weight (lbs): 185  Respiratory Rate (breaths/min): 16 Body Mass Index (BMI): 27.3 Blood Pressure (mmHg): 104/60 Reference Range: 80 - 120 mg / dl Electronic Signature(s) Signed: 04/01/2017 7:50:03 AM By: Alric Quan Entered By: Alric Quan on 03/31/2017 10:58:16

## 2017-04-02 NOTE — Telephone Encounter (Signed)
Received fax from [place] [facility] that they received our documentation and that pt does not need to stop warfarin prior to procedure and may continue on this med.

## 2017-04-02 NOTE — Telephone Encounter (Signed)
Routed to fax # provided. 

## 2017-04-03 LAB — AEROBIC CULTURE W GRAM STAIN (SUPERFICIAL SPECIMEN)

## 2017-04-03 LAB — AEROBIC CULTURE  (SUPERFICIAL SPECIMEN)

## 2017-04-04 NOTE — Progress Notes (Signed)
Agree, thanks

## 2017-04-06 ENCOUNTER — Ambulatory Visit: Payer: Medicare Other

## 2017-04-07 ENCOUNTER — Ambulatory Visit
Admission: RE | Admit: 2017-04-07 | Discharge: 2017-04-07 | Disposition: A | Discharge status: Home / Self Care | Payer: Medicare Other | Source: Ambulatory Visit | Attending: Internal Medicine | Admitting: Internal Medicine

## 2017-04-07 ENCOUNTER — Other Ambulatory Visit
Admission: RE | Admit: 2017-04-07 | Discharge: 2017-04-07 | Disposition: A | Discharge status: Home / Self Care | Payer: Medicare Other | Source: Ambulatory Visit | Attending: Family Medicine | Admitting: Family Medicine

## 2017-04-07 ENCOUNTER — Encounter: Payer: Medicare Other | Admitting: Internal Medicine

## 2017-04-07 ENCOUNTER — Ambulatory Visit (INDEPENDENT_AMBULATORY_CARE_PROVIDER_SITE_OTHER): Payer: Medicare Other

## 2017-04-07 ENCOUNTER — Other Ambulatory Visit: Payer: Self-pay | Admitting: Internal Medicine

## 2017-04-07 ENCOUNTER — Telehealth: Payer: Self-pay | Admitting: Family Medicine

## 2017-04-07 DIAGNOSIS — J449 Chronic obstructive pulmonary disease, unspecified: Secondary | ICD-10-CM | POA: Diagnosis not present

## 2017-04-07 DIAGNOSIS — I130 Hypertensive heart and chronic kidney disease with heart failure and stage 1 through stage 4 chronic kidney disease, or unspecified chronic kidney disease: Secondary | ICD-10-CM | POA: Diagnosis not present

## 2017-04-07 DIAGNOSIS — N189 Chronic kidney disease, unspecified: Secondary | ICD-10-CM | POA: Diagnosis not present

## 2017-04-07 DIAGNOSIS — Z5181 Encounter for therapeutic drug level monitoring: Secondary | ICD-10-CM | POA: Diagnosis not present

## 2017-04-07 DIAGNOSIS — S81802A Unspecified open wound, left lower leg, initial encounter: Secondary | ICD-10-CM | POA: Diagnosis not present

## 2017-04-07 DIAGNOSIS — L97223 Non-pressure chronic ulcer of left calf with necrosis of muscle: Secondary | ICD-10-CM | POA: Insufficient documentation

## 2017-04-07 DIAGNOSIS — L03116 Cellulitis of left lower limb: Secondary | ICD-10-CM | POA: Diagnosis not present

## 2017-04-07 DIAGNOSIS — E11610 Type 2 diabetes mellitus with diabetic neuropathic arthropathy: Secondary | ICD-10-CM | POA: Diagnosis not present

## 2017-04-07 DIAGNOSIS — T84623A Infection and inflammatory reaction due to internal fixation device of left tibia, initial encounter: Secondary | ICD-10-CM | POA: Insufficient documentation

## 2017-04-07 DIAGNOSIS — Z7901 Long term (current) use of anticoagulants: Secondary | ICD-10-CM | POA: Diagnosis not present

## 2017-04-07 DIAGNOSIS — I4891 Unspecified atrial fibrillation: Secondary | ICD-10-CM | POA: Diagnosis not present

## 2017-04-07 DIAGNOSIS — I509 Heart failure, unspecified: Secondary | ICD-10-CM | POA: Diagnosis not present

## 2017-04-07 DIAGNOSIS — S8992XA Unspecified injury of left lower leg, initial encounter: Secondary | ICD-10-CM | POA: Diagnosis not present

## 2017-04-07 DIAGNOSIS — M1990 Unspecified osteoarthritis, unspecified site: Secondary | ICD-10-CM | POA: Diagnosis not present

## 2017-04-07 DIAGNOSIS — H409 Unspecified glaucoma: Secondary | ICD-10-CM | POA: Diagnosis not present

## 2017-04-07 DIAGNOSIS — I739 Peripheral vascular disease, unspecified: Secondary | ICD-10-CM | POA: Diagnosis not present

## 2017-04-07 DIAGNOSIS — M8580 Other specified disorders of bone density and structure, unspecified site: Secondary | ICD-10-CM | POA: Insufficient documentation

## 2017-04-07 DIAGNOSIS — S81812D Laceration without foreign body, left lower leg, subsequent encounter: Secondary | ICD-10-CM | POA: Diagnosis not present

## 2017-04-07 DIAGNOSIS — X58XXXA Exposure to other specified factors, initial encounter: Secondary | ICD-10-CM | POA: Diagnosis not present

## 2017-04-07 DIAGNOSIS — E1122 Type 2 diabetes mellitus with diabetic chronic kidney disease: Secondary | ICD-10-CM | POA: Diagnosis not present

## 2017-04-07 DIAGNOSIS — B9689 Other specified bacterial agents as the cause of diseases classified elsewhere: Secondary | ICD-10-CM | POA: Diagnosis not present

## 2017-04-07 LAB — POCT INR: INR: 1.7

## 2017-04-07 NOTE — Telephone Encounter (Signed)
Call from 1 clinic. Patient is on doxycycline. His leg is not worse but is not better. One clinic M.D. placed patient on cefdinir near in addition to Doxy. We please see about getting patient's INR rechecked on Monday instead of 1 week? Many thanks.

## 2017-04-07 NOTE — Telephone Encounter (Signed)
Spoke with patient and wife.  Appointment moved up to Monday 04/12/17 with me in coumadin clinic.  Patient verbalizes understanding and agreement with change.  Thanks.

## 2017-04-07 NOTE — Progress Notes (Signed)
Agree. Thanks

## 2017-04-07 NOTE — Patient Instructions (Signed)
Pre visit review using our clinic review tool, if applicable. No additional management support is needed unless otherwise documented below in the visit note. 

## 2017-04-08 NOTE — Progress Notes (Signed)
Benjamin Soto, Benjamin Soto ([id number]) Visit Report for 04/07/2017 Arrival Information Details Patient Name: Benjamin Soto, Benjamin Soto Date of Service: 04/07/2017 10:15 AM Medical Record Number: [id number] Patient Account Number: [id number] Date of Birth/Sex: 10/01/1935 (81 y.o. Male) Treating RN: Montey Hora Primary Care Keltie Labell: Elsie Stain Other Clinician: Referring Thyra Yinger: Elsie Stain Treating Marili Vader/Extender: Tito Dine in Treatment: 8 Visit Information History Since Last Visit Added or deleted any medications: No Patient Arrived: Kasandra Knudsen Any new allergies or adverse reactions: No Arrival Time: 10:09 Had a fall or experienced change in No Accompanied By: spouse activities of daily living that may affect Transfer Assistance: None risk of falls: Patient Identification Verified: Yes Signs or symptoms of abuse/neglect since last No Secondary Verification Process Yes visito Completed: Hospitalized since last visit: No Patient Requires Transmission- No Has Dressing in Place as Prescribed: Yes Based Precautions: Has Compression in Place as Prescribed: Yes Patient Has Alerts: Yes Pain Present Now: Yes Patient Alerts: Patient on Blood Thinner warfarin Type II Diabetic Electronic Signature(s) Signed: 04/07/2017 5:06:31 PM By: Montey Hora Entered By: Montey Hora on 04/07/2017 10:09:59 Benjamin Soto ([id number]) -------------------------------------------------------------------------------- Clinic Level of Care Assessment Details Patient Name: Benjamin Soto Date of Service: 04/07/2017 10:15 AM Medical Record Number: [id number] Patient Account Number: [id number] Date of Birth/Sex: 1936-05-02 (80 y.o. Male) Treating RN: Montey Hora Primary Care Jaleiah Asay: Elsie Stain Other Clinician: Referring Kinnedy Mongiello: Elsie Stain Treating Allisyn Kunz/Extender: Tito Dine in Treatment: 8 Clinic Level of Care Assessment Items TOOL 4 Quantity Score []  - Use when  only an EandM is performed on FOLLOW-UP visit 0 ASSESSMENTS - Nursing Assessment / Reassessment X - Reassessment of Co-morbidities (includes updates in patient status) 1 10 X - Reassessment of Adherence to Treatment Plan 1 5 ASSESSMENTS - Wound and Skin Assessment / Reassessment X - Simple Wound Assessment / Reassessment - one wound 1 5 []  - Complex Wound Assessment / Reassessment - multiple wounds 0 []  - Dermatologic / Skin Assessment (not related to wound area) 0 ASSESSMENTS - Focused Assessment []  - Circumferential Edema Measurements - multi extremities 0 []  - Nutritional Assessment / Counseling / Intervention 0 X - Lower Extremity Assessment (monofilament, tuning fork, pulses) 1 5 []  - Peripheral Arterial Disease Assessment (using hand held doppler) 0 ASSESSMENTS - Ostomy and/or Continence Assessment and Care []  - Incontinence Assessment and Management 0 []  - Ostomy Care Assessment and Management (repouching, etc.) 0 PROCESS - Coordination of Care X - Simple Patient / Family Education for ongoing care 1 15 []  - Complex (extensive) Patient / Family Education for ongoing care 0 []  - Staff obtains Programmer, systems, Records, Test Results / Process Orders 0 []  - Staff telephones HHA, Nursing Homes / Clarify orders / etc 0 []  - Routine Transfer to another Facility (non-emergent condition) 0 Benjamin Soto, Benjamin Soto ([id number]) []  - Routine Hospital Admission (non-emergent condition) 0 []  - New Admissions / Biomedical engineer / Ordering NPWT, Apligraf, etc. 0 []  - Emergency Hospital Admission (emergent condition) 0 X - Simple Discharge Coordination 1 10 []  - Complex (extensive) Discharge Coordination 0 PROCESS - Special Needs []  - Pediatric / Minor Patient Management 0 []  - Isolation Patient Management 0 []  - Hearing / Language / Visual special needs 0 []  - Assessment of Community assistance (transportation, D/C planning, etc.) 0 []  - Additional assistance / Altered mentation 0 []  - Support  Surface(s) Assessment (bed, cushion, seat, etc.) 0 INTERVENTIONS - Wound Cleansing / Measurement X - Simple Wound Cleansing - one wound 1 5 []  - Complex Wound Cleansing - multiple wounds  0 X - Wound Imaging (photographs - any number of wounds) 1 5 []  - Wound Tracing (instead of photographs) 0 X - Simple Wound Measurement - one wound 1 5 []  - Complex Wound Measurement - multiple wounds 0 INTERVENTIONS - Wound Dressings []  - Small Wound Dressing one or multiple wounds 0 X - Medium Wound Dressing one or multiple wounds 1 15 []  - Large Wound Dressing one or multiple wounds 0 []  - Application of Medications - topical 0 []  - Application of Medications - injection 0 INTERVENTIONS - Miscellaneous []  - External ear exam 0 Benjamin Soto, Benjamin Soto ([id number]) X - Specimen Collection (cultures, biopsies, blood, body fluids, etc.) 1 5 X - Specimen(s) / Culture(s) sent or taken to Lab for analysis 1 5 []  - Patient Transfer (multiple staff / Harrel Lemon Lift / Similar devices) 0 []  - Simple Staple / Suture removal (25 or less) 0 []  - Complex Staple / Suture removal (26 or more) 0 []  - Hypo / Hyperglycemic Management (close monitor of Blood Glucose) 0 []  - Ankle / Brachial Index (ABI) - do not check if billed separately 0 X - Vital Signs 1 5 Has the patient been seen at the hospital within the last three years: Yes Total Score: 95 Level Of Care: New/Established - Level 3 Electronic Signature(s) Signed: 04/07/2017 5:06:31 PM By: Montey Hora Entered By: Montey Hora on 04/07/2017 12:58:28 Benjamin Soto ([id number]) -------------------------------------------------------------------------------- Encounter Discharge Information Details Patient Name: Benjamin Soto Date of Service: 04/07/2017 10:15 AM Medical Record Number: [id number] Patient Account Number: [id number] Date of Birth/Sex: 1935/10/07 (81 y.o. Male) Treating RN: Montey Hora Primary Care Cyncere Sontag: Elsie Stain Other Clinician: Referring  Cato Liburd: Elsie Stain Treating Braelyn Jenson/Extender: Tito Dine in Treatment: 8 Encounter Discharge Information Items Discharge Pain Level: 0 Discharge Condition: Stable Ambulatory Status: Cane Discharge Destination: Home Transportation: Private Auto Accompanied By: spouse Schedule Follow-up Appointment: Yes Medication Reconciliation completed No and provided to Patient/Care Dawnyel Leven: Provided on Clinical Summary of Care: 04/07/2017 Form Type Recipient Paper Patient HD Electronic Signature(s) Signed: 04/07/2017 10:59:43 AM By: Ruthine Dose Entered By: Ruthine Dose on 04/07/2017 10:59:43 Benjamin Soto ([id number]) -------------------------------------------------------------------------------- Lower Extremity Assessment Details Patient Name: Benjamin Soto Date of Service: 04/07/2017 10:15 AM Medical Record Number: [id number] Patient Account Number: [id number] Date of Birth/Sex: 29-Dec-1935 (80 y.o. Male) Treating RN: Montey Hora Primary Care Theodus Ran: Elsie Stain Other Clinician: Referring Nikash Mortensen: Elsie Stain Treating Kalilah Barua/Extender: Ricard Dillon Weeks in Treatment: 8 Edema Assessment Assessed: [Left: No] [Right: No] Edema: [Left: N] [Right: o] Calf Left: Right: Point of Measurement: 36 cm From Medial Instep 35.7 cm cm Ankle Left: Right: Point of Measurement: 10 cm From Medial Instep 23.4 cm cm Vascular Assessment Pulses: Dorsalis Pedis Palpable: [Left:Yes] Posterior Tibial Extremity colors, hair growth, and conditions: Extremity Color: [Left:Normal] Hair Growth on Extremity: [Left:No] Temperature of Extremity: [Left:Warm] Capillary Refill: [Left:< 3 seconds] Electronic Signature(s) Signed: 04/07/2017 5:06:31 PM By: Montey Hora Entered By: Montey Hora on 04/07/2017 10:19:35 Benjamin Soto ([id number]) -------------------------------------------------------------------------------- Multi Wound Chart Details Patient Name:  Benjamin Soto Date of Service: 04/07/2017 10:15 AM Medical Record Number: [id number] Patient Account Number: [id number] Date of Birth/Sex: February 22, 1936 (81 y.o. Male) Treating RN: Montey Hora Primary Care Ivalee Strauser: Elsie Stain Other Clinician: Referring Leiyah Maultsby: Elsie Stain Treating Esaul Dorwart/Extender: Ricard Dillon Weeks in Treatment: 8 Vital Signs Height(in): 69 Pulse(bpm): 72 Weight(lbs): 185 Blood Pressure 125/58 (mmHg): Body Mass Index(BMI): 27 Temperature(F): 98.3 Respiratory Rate 18 (breaths/min): Photos: [1:No Photos] [N/A:N/A] Wound Location: [1:Left Lower Leg - Midline] [N/A:N/A] Wounding Event: [1:Trauma] [  N/A:N/A] Primary Etiology: [1:Trauma, Other] [N/A:N/A] Comorbid History: [1:Glaucoma, Chronic Obstructive Pulmonary Disease (COPD), Arrhythmia, Coronary Artery Disease, Hypertension, Type II Diabetes, Osteoarthritis, Neuropathy] [N/A:N/A] Date Acquired: [1:01/23/2017] [N/A:N/A] Weeks of Treatment: [1:8] [N/A:N/A] Wound Status: [1:Open] [N/A:N/A] Measurements L x W x D 4.7x2.6x0.2 [N/A:N/A] (cm) Area (cm) : [1:9.598] [N/A:N/A] Volume (cm) : [1:1.92] [N/A:N/A] % Reduction in Area: [1:-90.90%] [N/A:N/A] % Reduction in Volume: -281.70% [N/A:N/A] Classification: [1:Full Thickness Without Exposed Support Structures] [N/A:N/A] HBO Classification: [1:Grade 1] [N/A:N/A] Exudate Amount: [1:Large] [N/A:N/A] Exudate Type: [1:Serosanguineous] [N/A:N/A] Exudate Color: [1:red, brown] [N/A:N/A] Wound Margin: [1:Flat and Intact] [N/A:N/A] Granulation Amount: [1:Medium (34-66%)] [N/A:N/A] Granulation Quality: Red, Hyper-granulation N/A N/A Necrotic Amount: Medium (34-66%) N/A N/A Exposed Structures: Fat Layer (Subcutaneous N/A N/A Tissue) Exposed: Yes Fascia: No Tendon: No Muscle: No Joint: No Bone: No Epithelialization: Small (1-33%) N/A N/A Periwound Skin Texture: Excoriation: Yes N/A N/A Induration: Yes Callus: No Crepitus: No Rash: No Scarring:  No Periwound Skin Maceration: No N/A N/A Moisture: Dry/Scaly: No Periwound Skin Color: Ecchymosis: Yes N/A N/A Erythema: Yes Atrophie Blanche: No Cyanosis: No Hemosiderin Staining: No Mottled: No Pallor: No Rubor: No Erythema Location: Circumferential N/A N/A Temperature: No Abnormality N/A N/A Tenderness on Yes N/A N/A Palpation: Wound Preparation: Ulcer Cleansing: Other: N/A N/A soap and water Topical Anesthetic Applied: Other: lidociane 4% Treatment Notes Electronic Signature(s) Signed: 04/07/2017 4:47:30 PM By: Linton Ham MD Entered By: Linton Ham on 04/07/2017 10:49:09 Benjamin Soto ([id number]) -------------------------------------------------------------------------------- Multi-Disciplinary Care Plan Details Patient Name: Benjamin Soto Date of Service: 04/07/2017 10:15 AM Medical Record Number: [id number] Patient Account Number: [id number] Date of Birth/Sex: August 28, 1936 (81 y.o. Male) Treating RN: Montey Hora Primary Care Jemel Ono: Elsie Stain Other Clinician: Referring Stalin Gruenberg: Elsie Stain Treating Genise Strack/Extender: Tito Dine in Treatment: 8 Active Inactive ` Abuse / Safety / Falls / Self Care Management Nursing Diagnoses: Potential for falls Goals: Patient will not experience any injury related to falls Date Initiated: 02/09/2017 Target Resolution Date: 04/23/2017 Goal Status: Active Interventions: Assess fall risk on admission and as needed Notes: ` Orientation to the Wound Care Program Nursing Diagnoses: Knowledge deficit related to the wound healing center program Goals: Patient/caregiver will verbalize understanding of the [facility] Date Initiated: 02/09/2017 Target Resolution Date: 04/23/2017 Goal Status: Active Interventions: Provide education on orientation to the wound center Notes: ` Wound/Skin Impairment Nursing Diagnoses: Impaired tissue integrity Benjamin Soto, Benjamin Soto  ([id number]) Goals: Patient/caregiver will verbalize understanding of skin care regimen Date Initiated: 02/09/2017 Target Resolution Date: 04/23/2017 Goal Status: Active Ulcer/skin breakdown will have a volume reduction of 30% by week 4 Date Initiated: 02/09/2017 Target Resolution Date: 04/23/2017 Goal Status: Active Ulcer/skin breakdown will have a volume reduction of 50% by week 8 Date Initiated: 02/09/2017 Target Resolution Date: 04/23/2017 Goal Status: Active Ulcer/skin breakdown will have a volume reduction of 80% by week 12 Date Initiated: 02/09/2017 Target Resolution Date: 04/23/2017 Goal Status: Active Ulcer/skin breakdown will heal within 14 weeks Date Initiated: 02/09/2017 Target Resolution Date: 04/23/2017 Goal Status: Active Interventions: Assess patient/caregiver ability to obtain necessary supplies Assess patient/caregiver ability to perform ulcer/skin care regimen upon admission and as needed Assess ulceration(s) every visit Notes: Electronic Signature(s) Signed: 04/07/2017 5:06:31 PM By: Montey Hora Entered By: Montey Hora on 04/07/2017 10:30:58 Benjamin Soto ([id number]) -------------------------------------------------------------------------------- Pain Assessment Details Patient Name: Benjamin Soto Date of Service: 04/07/2017 10:15 AM Medical Record Number: [id number] Patient Account Number: [id number] Date of Birth/Sex: 07-02-1936 (81 y.o. Male) Treating RN: Montey Hora Primary Care Marvie Brevik: Elsie Stain Other Clinician: Referring Bettina Warn: Elsie Stain  Treating Mazzie Brodrick/Extender: Ricard Dillon Weeks in Treatment: 8 Active Problems Location of Pain Severity and Description of Pain Patient Has Paino Yes Site Locations Pain Location: Pain in Ulcers With Dressing Change: Yes Duration of the Pain. Constant / Intermittento Constant Pain Management and Medication Current Pain Management: Notes Topical or injectable lidocaine is offered to patient  for acute pain when surgical debridement is performed. If needed, Patient is instructed to use over the counter pain medication for the following 24-48 hours after debridement. Wound care MDs do not prescribed pain medications. Patient has chronic pain or uncontrolled pain. Patient has been instructed to make an appointment with their Primary Care Physician for pain management. Electronic Signature(s) Signed: 04/07/2017 5:06:31 PM By: Montey Hora Entered By: Montey Hora on 04/07/2017 10:10:20 Benjamin Soto ([id number]) -------------------------------------------------------------------------------- Patient/Caregiver Education Details Patient Name: Benjamin Soto Date of Service: 04/07/2017 10:15 AM Medical Record Number: [id number] Patient Account Number: [id number] Date of Birth/Gender: 02-20-1936 (81 y.o. Male) Treating RN: Montey Hora Primary Care Physician: Elsie Stain Other Clinician: Referring Physician: Elsie Stain Treating Physician/Extender: Tito Dine in Treatment: 8 Education Assessment Education Provided To: Patient and Caregiver Education Topics Provided Venous: Handouts: Other: wound care as ordered Methods: Demonstration, Explain/Verbal Responses: State content correctly Electronic Signature(s) Signed: 04/07/2017 5:06:31 PM By: Montey Hora Entered By: Montey Hora on 04/07/2017 10:32:54 Benjamin Soto ([id number]) -------------------------------------------------------------------------------- Wound Assessment Details Patient Name: Benjamin Soto Date of Service: 04/07/2017 10:15 AM Medical Record Number: [id number] Patient Account Number: [id number] Date of Birth/Sex: [birth year] (81 y.o. Male) Treating RN: Montey Hora Primary Care Abdalrahman Clementson: Elsie Stain Other Clinician: Referring Quenisha Lovins: Elsie Stain Treating Cella Cappello/Extender: Ricard Dillon Weeks in Treatment: 8 Wound Status Wound Number: 1 Primary Trauma,  Other Etiology: Wound Location: Left Lower Leg - Midline Wound Open Wounding Event: Trauma Status: Date Acquired: 01/23/2017 Comorbid Glaucoma, Chronic Obstructive Weeks Of Treatment: 8 History: Pulmonary Disease (COPD), Arrhythmia, Clustered Wound: No Coronary Artery Disease, Hypertension, Type II Diabetes, Osteoarthritis, Neuropathy Photos Photo Uploaded By: Montey Hora on 04/07/2017 14:04:53 Wound Measurements Length: (cm) 4.7 Width: (cm) 2.6 Depth: (cm) 0.2 Area: (cm) 9.598 Volume: (cm) 1.92 % Reduction in Area: -90.9% % Reduction in Volume: -281.7% Epithelialization: Small (1-33%) Tunneling: No Undermining: No Wound Description Full Thickness Without Exposed Foul Odor A Classification: Support Structures Slough/Fibr Diabetic Severity Grade 1 (Wagner): Wound Margin: Flat and Intact Exudate Amount: Large Exudate Type: Serosanguineous Exudate Color: red, brown Benjamin Soto, Benjamin Soto ([id number]) fter Cleansing: No ino Yes Wound Bed Granulation Amount: Medium (34-66%) Exposed Structure Granulation Quality: Red, Hyper-granulation Fascia Exposed: No Necrotic Amount: Medium (34-66%) Fat Layer (Subcutaneous Tissue) Exposed: Yes Necrotic Quality: Adherent Slough Tendon Exposed: No Muscle Exposed: No Joint Exposed: No Bone Exposed: No Periwound Skin Texture Texture Color No Abnormalities Noted: No No Abnormalities Noted: No Callus: No Atrophie Blanche: No Crepitus: No Cyanosis: No Excoriation: Yes Ecchymosis: Yes Induration: Yes Erythema: Yes Rash: No Erythema Location: Circumferential Scarring: No Hemosiderin Staining: No Mottled: No Moisture Pallor: No No Abnormalities Noted: No Rubor: No Dry / Scaly: No Maceration: No Temperature / Pain Temperature: No Abnormality Tenderness on Palpation: Yes Wound Preparation Ulcer Cleansing: Other: soap and water, Topical Anesthetic Applied: Other: lidociane 4%, Treatment Notes Wound #1 (Left, Midline  Lower Leg) 1. Cleansed with: Clean wound with Normal Saline 2. Anesthetic Topical Lidocaine 4% cream to wound bed prior to debridement 3. Peri-wound Care: Barrier cream 4. Dressing Applied: Aquacel Ag Other dressing (specify in notes) 5. Secondary Dressing Applied Kerlix/Conform 7. Secured with Tape Notes xtrasorb Benjamin Soto, Benjamin Soto (  [id number]) Electronic Signature(s) Signed: 04/07/2017 5:06:31 PM By: Montey Hora Entered By: Montey Hora on 04/07/2017 10:30:33 Benjamin Soto ([id number]) -------------------------------------------------------------------------------- [place] Details Patient Name: Benjamin Soto Date of Service: 04/07/2017 10:15 AM Medical Record Number: [id number] Patient Account Number: [id number] Date of Birth/Sex: Mar 21, 1936 (81 y.o. Male) Treating RN: Montey Hora Primary Care Katriel Cutsforth: Elsie Stain Other Clinician: Referring Elya Tarquinio: Elsie Stain Treating Lurene Robley/Extender: Tito Dine in Treatment: 8 Vital Signs Time Taken: 10:12 Temperature (F): 98.3 Height (in): 69 Pulse (bpm): 72 Weight (lbs): 185 Respiratory Rate (breaths/min): 18 Body Mass Index (BMI): 27.3 Blood Pressure (mmHg): 125/58 Reference Range: 80 - 120 mg / dl Electronic Signature(s) Signed: 04/07/2017 5:06:31 PM By: Montey Hora Entered By: Montey Hora on 04/07/2017 10:12:55

## 2017-04-08 NOTE — Progress Notes (Addendum)
KEL, SENN ([id number]) Visit Report for 04/07/2017 HPI Details Patient Name: Benjamin Soto, Benjamin Soto Date of Service: 04/07/2017 10:15 AM Medical Record Number: [id number] Patient Account Number: [id number] Date of Birth/Sex: 01-22-1936 (81 y.o. Male) Treating RN: Montey Hora Primary Care Provider: Elsie Stain Other Clinician: Referring Provider: Elsie Stain Treating Provider/Extender: Ricard Dillon Weeks in Treatment: 8 History of Present Illness HPI Description: 02/09/17; 81 year old man with a hx of type 2 DM, smoker. He suffered a fall 17 days ago while working on the [employer] of his trailer. He didn't realized anything had happed until he returned home. Has been applying polysporin. Received Cephalexin finishes on Saturday. No prior wound history. ABI's 0.96 on the left. No known hx of PAD. Hx of Afib on Coumadin,chf,st 2 crf 02/16/17; patient arrives today with the condition of his wound improved he is on silver alginate. Culture of the divot medially last week grew 2 different types of enterococcus including Enterococcus faecalis. He is on Augmentin. Surface of the wound looks better. Still debridement required today 02/23/17; traumatic wound on the left anterior leg. He has finished Augmentin. He found debridement last week painful fortunately none was required today 03/02/17; traumatic wound on the left anterior leg. Still 75% necrotic surface material requiring debridement. Most of the rest that it is stable 03/09/17; initially traumatic wound on his left leg. Arrives today with complaints of more pain and more erythema around the wound. States the pain is severe enough that it keeps him awake at night. The original wound surface itself looks good and the divot that was laterally has closed over. We switched him to [facility] last week. Wife shows me a lot of drainage coming out of the wound surface 03/16/17; the patient comes in today with a better-looking wound circumference  no erythema. He is completed the doxycycline I gave him. He still complains of a lot of pain which he states radiates into the foot and keeps him awake at night. It seems that the original injury happened when he fell backwards and the anterior leg came up against part of a trailer hitch is the best I'm able to determine. He also hurt his lower coccyx. We have been using silver alginate since last week largely because of the infection 03/23/17; no major change in this man's wounds. He has leaking edema fluid. Surface of the wound looks better we have been using silver alginate. Still complaining of a lot of discomfort 03/31/17 on evaluation today patient appears to be doing worse unfortunately. His wound appears to be infected and is more tender to palpation. Nothing has changed other than he has discontinued his antibiotic at this point. 04/07/17; patient continues to decline with erythema around the wound. Culture last week did not significantly culture and organism. There was no staph aureus no group A strep although the Gram stain showed abundant gram-negative rods. The only culture we did when he initially came in here showed both enterococcus and Enterobacter. I gave him a course of Augmentin and things really seemed to settle down although I don't know that the Enterobacter would've been treated well. He still complains of a lot of pain. He a lot of complaints about the wrap Electronic Signature(s) Signed: 04/07/2017 4:47:30 PM By: Linton Ham MD Ozora, Rosalee Kaufman ([id number]) Entered By: Linton Ham on 04/07/2017 10:50:33 Benjamin Soto ([id number]) -------------------------------------------------------------------------------- Physical Exam Details Patient Name: Benjamin Soto Date of Service: 04/07/2017 10:15 AM Medical Record Number: [id number] Patient Account Number: [id number] Date of Birth/Sex: [birth year] (80 y.o.  Male) Treating RN: Montey Hora Primary Care Provider:  Elsie Stain Other Clinician: Referring Provider: Elsie Stain Treating Provider/Extender: Tito Dine in Treatment: 8 Constitutional Sitting or standing Blood Pressure is within target range for patient.. Pulse regular and within target range for patient.Marland Kitchen Respirations regular, non-labored and within target range.Marland Kitchen appears in no distress. Eyes Conjunctivae clear. No discharge. Respiratory Respiratory effort is easy and symmetric bilaterally. Rate is normal at rest and on room air.. Cardiovascular . Pedal pulses palpable and strong bilaterally.. Lymphatic None palpable in the popliteal or inguinal area. Integumentary (Hair, Skin) Skin and subcutaneous tissue without rashes, lesions, discoloration or ulcers. No evidence of fungal disease. Hair and skin color texture normal and healthy in appearance.Marland Kitchen Psychiatric No evidence of depression, anxiety, or agitation. Calm, cooperative, and communicative. Appropriate interactions and affect.. Notes Wound exam; the surface of the wound does not look too bad however there is a lot of drainage. Our intake nurse stated it looked purulent on arrival. There is much more surrounding erythema here and tenderness than I remember from 2 weeks ago. This would suggest coexistent infection the area looks very angry, warm and erythematous Electronic Signature(s) Signed: 04/07/2017 4:47:30 PM By: Linton Ham MD Entered By: Linton Ham on 04/07/2017 10:52:29 Benjamin Soto ([id number]) -------------------------------------------------------------------------------- Physician Orders Details Patient Name: Benjamin Soto Date of Service: 04/07/2017 10:15 AM Medical Record Number: [id number] Patient Account Number: [id number] Date of Birth/Sex: 1935/10/13 (81 y.o. Male) Treating RN: Montey Hora Primary Care Provider: Elsie Stain Other Clinician: Referring Provider: Elsie Stain Treating Provider/Extender: Tito Dine in Treatment: 8 Verbal / Phone Orders: No Diagnosis Coding Wound Cleansing Wound #1 Left,Midline Lower Leg o Clean wound with Normal Saline. o Cleanse wound with mild soap and water o Other: - May shower (DO NOT GET WRAP WET) wash with Hibiclens head to toe Anesthetic Wound #1 Left,Midline Lower Leg o Topical Lidocaine 4% cream applied to wound bed prior to debridement Skin Barriers/Peri-Wound Care Wound #1 Left,Midline Lower Leg o Barrier cream Primary Wound Dressing Wound #1 Left,Midline Lower Leg o Aquacel Ag Secondary Dressing Wound #1 Left,Midline Lower Leg o Conform/Kerlix o XtraSorb Dressing Change Frequency Wound #1 Left,Midline Lower Leg o Change dressing every other day. Follow-up Appointments Wound #1 Left,Midline Lower Leg o Return Appointment in 1 week. Edema Control Wound #1 Left,Midline Lower Leg o Elevate legs to the level of the heart and pump ankles as often as possible Scalici, Asa ([id number]) Additional Orders / Instructions Wound #1 Left,Midline Lower Leg o Increase protein intake. o Other: - Please add vitamin A, vitamin C and zinc supplements to your diet Laboratory o Bacteria identified in Wound by Culture (MICRO) oooo LOINC Code: 5009-3 oooo Convenience Name: Wound culture routine Radiology o X-ray, lower leg Patient Medications Allergies: No Known Allergies Notifications Medication Indication Start End cefdinir cellulitis left leg 04/07/2017 in addition to doxycycline DOSE oral 300 mg capsule - 1 capsule oral bid Electronic Signature(s) Signed: 04/12/2017 1:51:12 PM By: Montey Hora Signed: 04/13/2017 5:23:04 PM By: Linton Ham MD Previous Signature: 04/07/2017 10:56:48 AM Version By: Linton Ham MD Entered By: Montey Hora on 04/12/2017 08:44:37 Benjamin Soto ([id number]) -------------------------------------------------------------------------------- Problem List Details Patient  Name: Benjamin Soto Date of Service: 04/07/2017 10:15 AM Medical Record Number: [id number] Patient Account Number: [id number] Date of Birth/Sex: May 30, 1936 (81 y.o. 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 Male) Treating RN: Montey Hora Primary Care Provider: Elsie Stain Other Clinician: Referring Provider: Elsie Stain Treating Provider/Extender: Ricard Dillon Weeks in Treatment: 8 Subjective History of Present Illness (HPI) 02/09/17; 81 year old man with a hx of type 2 DM, smoker. He suffered a fall 17 days ago while working on the [employer] of his trailer. He didn't realized anything had happed until he returned home. Has been applying polysporin. Received Cephalexin finishes on Saturday. No prior wound history. ABI's 0.96 on the left. No known hx of PAD. Hx of Afib on Coumadin,chf,st 2 crf 02/16/17; patient arrives today with the condition of his wound improved he is on silver alginate. Culture of the divot medially last week grew 2 different types of  enterococcus including Enterococcus faecalis. He is on Augmentin. Surface of the wound looks better. Still debridement required today 02/23/17; traumatic wound on the left anterior leg. He has finished Augmentin. He found debridement last week painful fortunately none was required today 03/02/17; traumatic wound on the left anterior leg. Still 75% necrotic surface material requiring debridement. Most of the rest that it is stable 03/09/17; initially traumatic wound on his left leg. Arrives today with complaints of more pain and more erythema around the wound. States the pain is severe enough that it keeps him awake at night. The original wound surface itself looks good and the divot that was laterally has closed over. We switched him to [facility] last week. Wife shows me a lot of drainage coming out of the wound surface 03/16/17; the patient comes in today with a better-looking wound circumference no erythema. He is completed the doxycycline I gave him. He still complains of a lot of pain which he states radiates into the foot and keeps him awake at night. It seems that the original injury happened when he fell backwards and the anterior leg came up against part of a trailer hitch is the best I'm able to determine. He also hurt his lower coccyx. We have been using silver alginate since last week largely because of the infection 03/23/17; no major change in this man's wounds. He has leaking edema fluid. Surface of the wound looks better we have been using silver alginate. Still complaining of a lot of discomfort 03/31/17 on evaluation today patient appears to be doing worse unfortunately. His wound appears to be infected and is more tender to palpation. Nothing has changed other than he has discontinued his antibiotic at this point. 04/07/17; patient continues to decline with erythema around the wound. Culture last week did not significantly culture and organism. There was no staph aureus no group A  strep although the Gram stain showed abundant gram-negative rods. The only culture we did when he initially came in here showed both enterococcus and Enterobacter. I gave him a course of Augmentin and things really seemed to settle down although I don't know that the Enterobacter would've been treated well. He still complains of a lot of pain. He a lot of  complaints about the wrap Reece, Hanzel ([id number]) Objective Constitutional Sitting or standing Blood Pressure is within target range for patient.. Pulse regular and within target range for patient.Marland Kitchen Respirations regular, non-labored and within target range.Marland Kitchen appears in no distress. Vitals Time Taken: 10:12 AM, Height: 69 in, Weight: 185 lbs, BMI: 27.3, Temperature: 98.3 F, Pulse: 72 bpm, Respiratory Rate: 18 breaths/min, Blood Pressure: 125/58 mmHg. Eyes Conjunctivae clear. No discharge. Respiratory Respiratory effort is easy and symmetric bilaterally. Rate is normal at rest and on room air.. Cardiovascular Pedal pulses palpable and strong bilaterally.. Lymphatic None palpable in the popliteal or inguinal area. Psychiatric No evidence of depression, anxiety, or agitation. Calm, cooperative, and communicative. Appropriate interactions and affect.. General Notes: Wound exam; the surface of the wound does not look too bad however there is a lot of drainage. Our intake nurse stated it looked purulent on arrival. There is much more surrounding erythema here and tenderness than I remember from 2 weeks ago. This would suggest coexistent infection the area looks very angry, warm and erythematous Integumentary (Hair, Skin) Skin and subcutaneous tissue without rashes, lesions, discoloration or ulcers. No evidence of fungal disease. Hair and skin color texture normal and healthy in appearance.. Wound #1 status is Open. Original cause of wound was Trauma. The wound is located on the Left,Midline Lower Leg. The wound measures 4.7cm length x  2.6cm width x 0.2cm depth; 9.598cm^2 area and 1.92cm^3 volume. There is Fat Layer (Subcutaneous Tissue) Exposed exposed. There is no tunneling or undermining noted. There is a large amount of serosanguineous drainage noted. The wound margin is flat and intact. There is medium (34-66%) red granulation within the wound bed. There is a medium (34-66%) amount of necrotic tissue within the wound bed including Adherent Slough. The periwound skin appearance exhibited: Excoriation, Induration, Ecchymosis, Erythema. The periwound skin appearance did not exhibit: Callus, Crepitus, Rash, Scarring, Dry/Scaly, Maceration, Atrophie Blanche, Cyanosis, Hemosiderin Staining, Mottled, Pallor, Rubor. The surrounding wound skin color is noted with erythema which is circumferential. Periwound temperature was noted as No Abnormality. The periwound has tenderness on Krupka, Tremel ([id number]) palpation. 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 Cleanse wound with mild soap and water Other: - May shower (DO NOT GET WRAP WET) wash with Hibiclens head to toe Anesthetic: Wound #1 Left,Midline Lower Leg: Topical Lidocaine 4% cream applied to wound bed prior to debridement Skin Barriers/Peri-Wound Care: Wound #1 Left,Midline Lower Leg: Barrier cream Primary Wound Dressing: Wound #1 Left,Midline Lower Leg: Aquacel Ag Secondary Dressing: Wound #1 Left,Midline Lower Leg: Conform/Kerlix XtraSorb Dressing Change Frequency: Wound #1 Left,Midline Lower Leg: Change dressing every other day. Follow-up Appointments: Wound #1 Left,Midline Lower Leg: Return Appointment in 1 week. Edema Control: Wound #1 Left,Midline Lower Leg: Elevate legs to  the level of the heart and pump ankles as often as possible Additional Orders / Instructions: Wound #1 Left,Midline Lower Leg: MOHMMAD, SALEEBY ([id number]) Increase protein intake. Other: - Please add vitamin A, vitamin C and zinc supplements to your diet Radiology ordered were: X-ray, lower leg The following medication(s) was prescribed: cefdinir oral 300 mg capsule 1 capsule oral bid for cellulitis left leg in addition to doxycycline starting 04/07/2017 1 add cefdinir 300 bid for 7 days 2continue doxy 3 silver alginate foam change q 2d 4 xray  of left tib fib 5 call primary wrt coumadin Electronic Signature(s) Signed: 04/07/2017 4:47:30 PM By: Linton Ham MD Entered By: Linton Ham on 04/07/2017 10:59:05 Benjamin Soto ([id number]) -------------------------------------------------------------------------------- [place] Details Patient Name: Benjamin Soto Date of Service: 04/07/2017 Medical Record Number: [id number] Patient Account Number: [id number] Date of Birth/Sex: [birth year] (81 y.o. 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

## 2017-04-10 LAB — AEROBIC CULTURE  (SUPERFICIAL SPECIMEN)

## 2017-04-10 LAB — AEROBIC CULTURE W GRAM STAIN (SUPERFICIAL SPECIMEN)

## 2017-04-12 ENCOUNTER — Ambulatory Visit (INDEPENDENT_AMBULATORY_CARE_PROVIDER_SITE_OTHER): Payer: Medicare Other

## 2017-04-12 DIAGNOSIS — Z5181 Encounter for therapeutic drug level monitoring: Secondary | ICD-10-CM

## 2017-04-12 LAB — POCT INR: INR: 1.3

## 2017-04-12 NOTE — Patient Instructions (Signed)
Pre visit review using our clinic review tool, if applicable. No additional management support is needed unless otherwise documented below in the visit note. 

## 2017-04-14 ENCOUNTER — Encounter: Payer: Medicare Other | Admitting: Internal Medicine

## 2017-04-14 DIAGNOSIS — E1122 Type 2 diabetes mellitus with diabetic chronic kidney disease: Secondary | ICD-10-CM | POA: Diagnosis not present

## 2017-04-14 DIAGNOSIS — N189 Chronic kidney disease, unspecified: Secondary | ICD-10-CM | POA: Diagnosis not present

## 2017-04-14 DIAGNOSIS — J449 Chronic obstructive pulmonary disease, unspecified: Secondary | ICD-10-CM | POA: Diagnosis not present

## 2017-04-14 DIAGNOSIS — I509 Heart failure, unspecified: Secondary | ICD-10-CM | POA: Diagnosis not present

## 2017-04-14 DIAGNOSIS — Z7901 Long term (current) use of anticoagulants: Secondary | ICD-10-CM | POA: Diagnosis not present

## 2017-04-14 DIAGNOSIS — E11610 Type 2 diabetes mellitus with diabetic neuropathic arthropathy: Secondary | ICD-10-CM | POA: Diagnosis not present

## 2017-04-14 DIAGNOSIS — I130 Hypertensive heart and chronic kidney disease with heart failure and stage 1 through stage 4 chronic kidney disease, or unspecified chronic kidney disease: Secondary | ICD-10-CM | POA: Diagnosis not present

## 2017-04-14 DIAGNOSIS — L97223 Non-pressure chronic ulcer of left calf with necrosis of muscle: Secondary | ICD-10-CM | POA: Diagnosis not present

## 2017-04-14 DIAGNOSIS — S81802A Unspecified open wound, left lower leg, initial encounter: Secondary | ICD-10-CM | POA: Diagnosis not present

## 2017-04-14 DIAGNOSIS — M1990 Unspecified osteoarthritis, unspecified site: Secondary | ICD-10-CM | POA: Diagnosis not present

## 2017-04-14 DIAGNOSIS — H409 Unspecified glaucoma: Secondary | ICD-10-CM | POA: Diagnosis not present

## 2017-04-14 DIAGNOSIS — S81812D Laceration without foreign body, left lower leg, subsequent encounter: Secondary | ICD-10-CM | POA: Diagnosis not present

## 2017-04-14 DIAGNOSIS — I4891 Unspecified atrial fibrillation: Secondary | ICD-10-CM | POA: Diagnosis not present

## 2017-04-14 NOTE — Progress Notes (Signed)
Agree, thanks

## 2017-04-15 ENCOUNTER — Ambulatory Visit: Payer: Medicare Other

## 2017-04-16 ENCOUNTER — Ambulatory Visit (INDEPENDENT_AMBULATORY_CARE_PROVIDER_SITE_OTHER): Payer: Medicare Other

## 2017-04-16 DIAGNOSIS — Z5181 Encounter for therapeutic drug level monitoring: Secondary | ICD-10-CM

## 2017-04-16 LAB — POCT INR: INR: 1.3

## 2017-04-16 NOTE — Patient Instructions (Signed)
Pre visit review using our clinic review tool, if applicable. No additional management support is needed unless otherwise documented below in the visit note. 

## 2017-04-16 NOTE — Progress Notes (Signed)
CHASYN, CINQUE ([id number]) Visit Report for 04/14/2017 Chief Complaint Document Details Patient Name: Benjamin Soto, Benjamin Soto Date of Service: 04/14/2017 11:00 AM Medical Record Number: [id number] Patient Account Number: [id number] Date of Birth/Sex: 1936-04-12 (81 y.o. Male) Treating RN: Cornell Barman Primary Care Provider: Elsie Stain Other Clinician: Referring Provider: Elsie Stain Treating Provider/Extender: Tito Dine in Treatment: 9 Information Obtained from: Patient Chief Complaint 02/09/17; Patient is here for review of a traumatic wound on the left leg anterior Electronic Signature(s) Signed: 04/14/2017 6:25:53 PM By: Linton Ham MD Entered By: Linton Ham on 04/14/2017 12:16:48 Benjamin Soto ([id number]) -------------------------------------------------------------------------------- Debridement Details Patient Name: Benjamin Soto Date of Service: 04/14/2017 11:00 AM Medical Record Number: [id number] Patient Account Number: [id number] Date of Birth/Sex: Aug 04, 1936 (81 y.o. Male) Treating RN: Cornell Barman Primary Care Provider: Elsie Stain Other Clinician: Referring Provider: Elsie Stain Treating Provider/Extender: Tito Dine in Treatment: 9 Debridement Performed for Wound #1 Left,Midline Lower Leg Assessment: Performed By: Physician Ricard Dillon, MD Debridement: Debridement Pre-procedure Verification/Time Out Yes - 11:11 Taken: Start Time: 11:12 Pain Control: Other : lidoicaine 4% Level: Skin/Subcutaneous Tissue Total Area Debrided (L x 4.5 (cm) x 2.2 (cm) = 9.9 (cm) W): Tissue and other Viable, Fibrin/Slough, Skin, Subcutaneous material debrided: Instrument: Curette Bleeding: Moderate Hemostasis Achieved: Pressure End Time: 11:15 Procedural Pain: 1 Post Procedural Pain: 1 Response to Treatment: Procedure was tolerated well Post Debridement Measurements of Total Wound Length: (cm) 4.5 Width: (cm) 2.2 Depth: (cm)  0.2 Volume: (cm) 1.555 Character of Wound/Ulcer Post Improved Debridement: Post Procedure Diagnosis Same as Pre-procedure Electronic Signature(s) Signed: 04/14/2017 6:25:53 PM By: Linton Ham MD Signed: 04/14/2017 6:30:52 PM By: Gretta Cool, BSN, RN, CWS, Kim RN, BSN Entered By: Linton Ham on 04/14/2017 12:16:40 Benjamin Soto ([id number]) -------------------------------------------------------------------------------- HPI Details Patient Name: Benjamin Soto Date of Service: 04/14/2017 11:00 AM Medical Record Number: [id number] Patient Account Number: [id number] Date of Birth/Sex: Apr 07, 1936 (81 y.o. Male) Treating RN: Cornell Barman Primary Care Provider: Elsie Stain Other Clinician: Referring Provider: Elsie Stain Treating Provider/Extender: Ricard Dillon Weeks in Treatment: 9 History of Present Illness HPI Description: 02/09/17; 81 year old man with a hx of type 2 DM, smoker. He suffered a fall 17 days ago while working on the [employer] of his trailer. He didn't realized anything had happed until he returned home. Has been applying polysporin. Received Cephalexin finishes on Saturday. No prior wound history. ABI's 0.96 on the left. No known hx of PAD. Hx of Afib on Coumadin,chf,st 2 crf 02/16/17; patient arrives today with the condition of his wound improved he is on silver alginate. Culture of the divot medially last week grew 2 different types of enterococcus including Enterococcus faecalis. He is on Augmentin. Surface of the wound looks better. Still debridement required today 02/23/17; traumatic wound on the left anterior leg. He has finished Augmentin. He found debridement last week painful fortunately none was required today 03/02/17; traumatic wound on the left anterior leg. Still 75% necrotic surface material requiring debridement. Most of the rest that it is stable 03/09/17; initially traumatic wound on his left leg. Arrives today with complaints of more pain and  more erythema around the wound. States the pain is severe enough that it keeps him awake at night. The original wound surface itself looks good and the divot that was laterally has closed over. We switched him to [facility] last week. Wife shows me a lot of drainage coming out of the wound surface 03/16/17; the patient comes in today with a better-looking wound  circumference no erythema. He is completed the doxycycline I gave him. He still complains of a lot of pain which he states radiates into the foot and keeps him awake at night. It seems that the original injury happened when he fell backwards and the anterior leg came up against part of a trailer hitch is the best I'm able to determine. He also hurt his lower coccyx. We have been using silver alginate since last week largely because of the infection 03/23/17; no major change in this man's wounds. He has leaking edema fluid. Surface of the wound looks better we have been using silver alginate. Still complaining of a lot of discomfort 03/31/17 on evaluation today patient appears to be doing worse unfortunately. His wound appears to be infected and is more tender to palpation. Nothing has changed other than he has discontinued his antibiotic at this point. 04/07/17; patient continues to decline with erythema around the wound. Culture last week did not significantly culture and organism. There was no staph aureus no group A strep although the Gram stain showed abundant gram-negative rods. The only culture we did when he initially came in here showed both enterococcus and Enterobacter. I gave him a course of Augmentin and things really seemed to settle down although I don't know that the Enterobacter would've been treated well. He still complains of a lot of pain. He a lot of complaints about the wrap 04/14/17; the patient has a better-looking wound today however he has less edema control now that we are not putting him under compression. X-ray I  did last week was negative. Culture and sensitivity negative. No need for further antibiotics Electronic Signature(s) Signed: 04/14/2017 6:25:53 PM By: Linton Ham MD Entered By: Linton Ham on 04/14/2017 12:18:29 Benjamin Soto, Benjamin Soto ([id number]) Benjamin Soto, Benjamin Soto ([id number]) -------------------------------------------------------------------------------- Physical Exam Details Patient Name: Benjamin Soto Date of Service: 04/14/2017 11:00 AM Medical Record Number: [id number] Patient Account Number: [id number] Date of Birth/Sex: May 05, 1936 (81 y.o. Male) Treating RN: Cornell Barman Primary Care Provider: Elsie Stain Other Clinician: Referring Provider: Elsie Stain Treating Provider/Extender: Ricard Dillon Weeks in Treatment: 9 Constitutional Patient is hypotensive.. Pulse regular and within target range for patient.Marland Kitchen Respirations regular, non-labored and within target range.. Temperature is normal and within the target range for the patient.Marland Kitchen appears in no distress. Eyes Conjunctivae clear. No discharge. Respiratory Respiratory effort is easy and symmetric bilaterally. Rate is normal at rest and on room air.. Cardiovascular Pedal pulses palpable and strong bilaterally.. Edema present in both extremities. 2+. No evidence of cellulitis or DVT. Lymphatic None palpable in the popliteal or inguinal area. Integumentary (Hair, Skin) There is absolutely no erythema or evidence of infection. Psychiatric No evidence of depression, anxiety, or agitation. Calm, cooperative, and communicative. Appropriate interactions and affect.. Notes Wound exam; the surface of the wound looks satisfactory. There was still a considerable amount of nonviable material which required debridement with a #3 curet. Hemostasis with direct pressure. There is a rim of epithelialization laterally which lends some degree of optimism. There is no surrounding infection no crepitus Electronic  Signature(s) Signed: 04/14/2017 6:25:53 PM By: Linton Ham MD Entered By: Linton Ham on 04/14/2017 12:24:01 Benjamin Soto ([id number]) -------------------------------------------------------------------------------- Physician Orders Details Patient Name: Benjamin Soto Date of Service: 04/14/2017 11:00 AM Medical Record Number: [id number] Patient Account Number: [id number] Date of Birth/Sex: 1936/05/30 (81 y.o. Male) Treating RN: Cornell Barman Primary Care Provider: Elsie Stain Other Clinician: Referring Provider: Elsie Stain Treating Provider/Extender: Tito Dine in Treatment: 9 Verbal / Phone Orders: No  Diagnosis Coding Wound Cleansing Wound #1 Left,Midline Lower Leg o Clean wound with Normal Saline. o Cleanse wound with mild soap and water Anesthetic Wound #1 Left,Midline Lower Leg o Topical Lidocaine 4% cream applied to wound bed prior to debridement Skin Barriers/Peri-Wound Care Wound #1 Left,Midline Lower Leg o Barrier cream Primary Wound Dressing Wound #1 Left,Midline Lower Leg o Other: - Endoform Secondary Dressing Wound #1 Left,Midline Lower Leg o ABD and Kerlix/Conform - Coban Dressing Change Frequency Wound #1 Left,Midline Lower Leg o Other: - Friday and Monday Follow-up Appointments Wound #1 Left,Midline Lower Leg o Return Appointment in 1 week. Edema Control Wound #1 Left,Midline Lower Leg o Elevate legs to the level of the heart and pump ankles as often as possible Additional Orders / Instructions Benjamin Soto, Benjamin Soto ([id number]) Wound #1 Left,Midline Lower Leg o Increase protein intake. o Other: - Please add vitamin A, vitamin C and zinc supplements to your diet Electronic Signature(s) Signed: 04/14/2017 6:25:53 PM By: Linton Ham MD Signed: 04/14/2017 6:30:52 PM By: Gretta Cool, BSN, RN, CWS, Kim RN, BSN Entered By: Gretta Cool, BSN, RN, CWS, Kim on 04/14/2017 12:01:47 Benjamin Soto  ([id number]) -------------------------------------------------------------------------------- Problem List Details Patient Name: Benjamin Soto Date of Service: 04/14/2017 11:00 AM Medical Record Number: [id number] Patient Account Number: [id number] Date of Birth/Sex: 1935/10/02 (81 y.o. 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 Male) Treating RN: Cornell Barman Primary Care Provider: Elsie Stain Other Clinician: Referring Provider: Elsie Stain Treating Provider/Extender: Ricard Dillon Weeks in Treatment: 9 Subjective Chief Complaint Information obtained from Patient 02/09/17; Patient is here for review of a traumatic wound on the left leg anterior History of Present Illness (HPI) 02/09/17; 81 year old man with a hx of type 2 DM, smoker. He suffered a fall 17 days ago while working on the [employer] of his trailer. He didn't realized anything had happed until he returned home. Has been applying polysporin. Received Cephalexin finishes on Saturday. No prior wound  history. ABI's 0.96 on the left. No known hx of PAD. Hx of Afib on Coumadin,chf,st 2 crf 02/16/17; patient arrives today with the condition of his wound improved he is on silver alginate. Culture of the divot medially last week grew 2 different types of enterococcus including Enterococcus faecalis. He is on Augmentin. Surface of the wound looks better. Still debridement required today 02/23/17; traumatic wound on the left anterior leg. He has finished Augmentin. He found debridement last week painful fortunately none was required today 03/02/17; traumatic wound on the left anterior leg. Still 75% necrotic surface material requiring debridement. Most of the rest that it is stable 03/09/17; initially traumatic wound on his left leg. Arrives today with complaints of more pain and more erythema around the wound. States the pain is severe enough that it keeps him awake at night. The original wound surface itself looks good and the divot that was  laterally has closed over. We switched him to [facility] last week. Wife shows me a lot of drainage coming out of the wound surface 03/16/17; the patient comes in today with a better-looking wound circumference no erythema. He is completed the doxycycline I gave him. He still complains of a lot of pain which he states radiates into the foot and keeps him awake at night. It seems that the original injury happened when he fell backwards and the anterior leg came up against part of a trailer hitch is the best I'm able to determine. He also hurt his lower coccyx. We have been using silver alginate since last week largely because of the infection 03/23/17; no major change in this man's wounds. He has leaking edema fluid. Surface of the wound looks better we have been using silver alginate. Still complaining of a lot of discomfort 03/31/17 on evaluation today patient appears to be doing worse unfortunately. His wound appears to be infected and is more tender to  palpation. Nothing has changed other than he has discontinued his antibiotic at this point. 04/07/17; patient continues to decline with erythema around the wound. Culture last week did not significantly culture and organism. There was no staph aureus no group A strep although the Gram stain showed abundant gram-negative rods. The only culture we did when he initially came in here showed both enterococcus and Enterobacter. I gave him a course of Augmentin and things really seemed to settle down although I don't know that the Enterobacter would've been treated well. He still complains of a lot of pain. He a lot of complaints about the wrap 04/14/17; the patient has a better-looking wound today however he has less edema control now that we are not putting him under compression. X-ray I did last week was negative. Culture and sensitivity negative. No CEPHAS, Benjamin Soto ([id number]) need for further antibiotics Objective Constitutional Patient is hypotensive.. Pulse regular and within target range for patient.Marland Kitchen Respirations regular, non-labored and within target range.. Temperature is normal and within the target range for the patient.Marland Kitchen appears in no distress. Vitals Time Taken: 11:05 AM, Height: 69 in, Weight: 185 lbs, BMI: 27.3, Temperature: 97.9 F, Pulse: 68 bpm, Respiratory Rate: 16 breaths/min, Blood Pressure: 93/74 mmHg. Eyes Conjunctivae clear. No discharge. Respiratory Respiratory effort is easy and symmetric bilaterally. Rate is normal at rest and on room air.. Cardiovascular Pedal pulses palpable and strong bilaterally.. Edema present in both extremities. 2+. No evidence of cellulitis or DVT. Lymphatic None palpable in the popliteal or inguinal area. Psychiatric No evidence of depression, anxiety, or agitation. Calm, cooperative, and communicative. Appropriate interactions and affect.. General Notes: Wound exam; the surface of the wound looks satisfactory. There was still a  considerable amount of nonviable material which required debridement with a #3 curet. Hemostasis with direct pressure. There is a rim of epithelialization laterally which lends some degree of optimism. There is no surrounding infection no crepitus Integumentary (Hair, Skin) There is absolutely no erythema or evidence of infection. Wound #1 status is Open. Original cause of wound was Trauma. The wound is located on the Left,Midline Lower Leg. The wound measures 4.5cm length x 2.2cm width x 0.1cm depth; 7.775cm^2 area and 0.778cm^3 volume. There is Fat Layer (Subcutaneous Tissue) Exposed exposed. There is no tunneling or Spike, Jerrik ([id number]) undermining noted. There is a medium amount of serosanguineous drainage noted. The wound margin is flat and intact. There is medium (34-66%) red granulation within the wound bed. There is a medium (34- 66%) amount  of necrotic tissue within the wound bed including Adherent Slough. The periwound skin appearance exhibited: Excoriation, Induration, Ecchymosis, Erythema. The periwound skin appearance did not exhibit: Callus, Crepitus, Rash, Scarring, Dry/Scaly, Maceration, Atrophie Blanche, Cyanosis, Hemosiderin Staining, Mottled, Pallor, Rubor. The surrounding wound skin color is noted with erythema which is circumferential. Periwound temperature was noted as No Abnormality. The periwound has tenderness on palpation. 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 There was a Skin/Subcutaneous Tissue Debridement ([id number]) debridement with total area of 9.9 sq cm performed by Ricard Dillon, MD. with the following instrument(s): Curette to remove  Viable tissue/material including Fibrin/Slough, Skin, and Subcutaneous after achieving pain control using Other (lidoicaine 4%). A time out was conducted at 11:11, prior to the start of the procedure. A Moderate amount of bleeding was controlled with Pressure. The procedure was tolerated well with a pain level of 1 throughout and a pain level of 1 following the procedure. Post Debridement Measurements: 4.5cm length x 2.2cm width x 0.2cm depth; 1.555cm^3 volume. Character of Wound/Ulcer Post Debridement is improved. Post procedure Diagnosis Wound #1: Same as Pre-Procedure Plan Wound Cleansing: Wound #1 Left,Midline Lower Leg: STAN, CANTAVE ([id number]) Clean wound with Normal Saline. Cleanse wound with mild soap and water Anesthetic: Wound #1 Left,Midline Lower Leg: Topical Lidocaine 4% cream applied to wound bed prior to debridement Skin Barriers/Peri-Wound Care: Wound #1 Left,Midline Lower Leg: Barrier cream Primary Wound Dressing: Wound #1 Left,Midline Lower Leg: Other: - Endoform Secondary Dressing: Wound #1 Left,Midline Lower Leg: ABD and Kerlix/Conform - Coban Dressing Change Frequency: Wound #1 Left,Midline Lower Leg: Other: - Friday and Monday Follow-up Appointments: Wound #1 Left,Midline Lower Leg: Return Appointment in 1 week. Edema Control: Wound #1 Left,Midline Lower Leg: Elevate legs to the level of the heart and pump ankles as often as possible Additional Orders / Instructions: Wound #1 Left,Midline Lower Leg: Increase protein intake. Other: - Please add vitamin A, vitamin C and zinc supplements to your diet #1 the wound surface underwent debridement is noted #2 he complains of pain with the silver alginate therefore I've discontinued this and put him on Endoform #3 poor edema control, he does not tolerate compression well. #4 I see no need for additional antibiotics Electronic Signature(s) Signed: 04/14/2017 6:25:53 PM By: Linton Ham MD Entered By:  Linton Ham on 04/14/2017 12:27:09 Benjamin Soto ([id number]) -------------------------------------------------------------------------------- [place] Details Patient Name: Benjamin Soto Date of Service: 04/14/2017 Medical Record Number: [id number] Patient Account Number: [id number] Date of Birth/Sex: Oct 19, 1935 (81 y.o. 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 L03.116 Cellulitis of left lower limb Facility Procedures CPT4 Code Description: [id number] 11042 - DEB SUBQ TISSUE 20 SQ CM/< ICD-10 Description Diagnosis S81.812D Laceration without foreign body, left lower leg, s L97.223 Non-pressure chronic ulcer of left calf with necro Modifier: ubsequent enc sis of muscle Quantity: 1 ounter Physician Procedures CPT4 Code Description: [id number] 11042 - WC PHYS SUBQ TISS 20 SQ CM ICD-10 Description Diagnosis S81.812D Laceration without foreign body, left lower leg, s W73.710  Non-pressure chronic ulcer of left calf with necro Modifier: ubsequent enco sis of muscle Quantity: 1 Personal assistant) Signed: 04/14/2017 6:25:53 PM By: Linton Ham MD Entered By: Linton Ham on 04/14/2017 12:27:51

## 2017-04-16 NOTE — Progress Notes (Signed)
LEONTAE, BOSTOCK ([id number]) Visit Report for 04/14/2017 Arrival Information Details Patient Name: Benjamin Soto, Benjamin Soto Date of Service: 04/14/2017 11:00 AM Medical Record Number: [id number] Patient Account Number: [id number] Date of Birth/Sex: Dec 28, 1935 (81 y.o. Male) Treating RN: Cornell Barman Primary Care Aubre Quincy: Elsie Stain Other Clinician: Referring Rory Xiang: Elsie Stain Treating Karthikeya Funke/Extender: Tito Dine in Treatment: 9 Visit Information History Since Last Visit Added or deleted any medications: No Patient Arrived: Ambulatory Any new allergies or adverse reactions: No Arrival Time: 11:04 Had a fall or experienced change in No Accompanied By: wife activities of daily living that may affect Transfer Assistance: None risk of falls: Patient Identification Verified: Yes Signs or symptoms of abuse/neglect since last No Secondary Verification Process Yes visito Completed: Hospitalized since last visit: No Patient Requires Transmission- No Has Dressing in Place as Prescribed: Yes Based Precautions: Pain Present Now: No Patient Has Alerts: Yes Patient Alerts: Patient on Blood Thinner warfarin Type II Diabetic Electronic Signature(s) Signed: 04/14/2017 6:30:52 PM By: Gretta Cool, BSN, RN, CWS, Kim RN, BSN Entered By: Gretta Cool, BSN, RN, CWS, Kim on 04/14/2017 11:04:41 Benjamin Soto ([id number]) -------------------------------------------------------------------------------- Encounter Discharge Information Details Patient Name: Benjamin Soto Date of Service: 04/14/2017 11:00 AM Medical Record Number: [id number] Patient Account Number: [id number] Date of Birth/Sex: Nov 16, 1935 (81 y.o. Male) Treating RN: Cornell Barman Primary Care Acy Orsak: Elsie Stain Other Clinician: Referring Khaliah Barnick: Elsie Stain Treating Alyona Romack/Extender: Tito Dine in Treatment: 9 Encounter Discharge Information Items Discharge Pain Level: 1 Discharge Condition:  Stable Ambulatory Status: Ambulatory Discharge Destination: Home Transportation: Private Auto Accompanied By: wife Schedule Follow-up Appointment: Yes Medication Reconciliation completed and provided to Patient/Care Yes Jashae Wiggs: Provided on Clinical Summary of Care: 04/14/2017 Form Type Recipient Paper Patient HD Electronic Signature(s) Signed: 04/14/2017 6:30:52 PM By: Gretta Cool, BSN, RN, CWS, Kim RN, BSN Previous Signature: 04/14/2017 11:57:09 AM Version By: Ruthine Dose Entered By: Gretta Cool, BSN, RN, CWS, Kim on 04/14/2017 12:15:02 Benjamin Soto ([id number]) -------------------------------------------------------------------------------- Lower Extremity Assessment Details Patient Name: Benjamin Soto Date of Service: 04/14/2017 11:00 AM Medical Record Number: [id number] Patient Account Number: [id number] Date of Birth/Sex: May 09, 1936 (81 y.o. Male) Treating RN: Cornell Barman Primary Care Ainsleigh Kakos: Elsie Stain Other Clinician: Referring Leviticus Harton: Elsie Stain Treating Jennilee Demarco/Extender: Ricard Dillon Weeks in Treatment: 9 Edema Assessment Assessed: [Left: No] [Right: No] E[Left: dema] [Right: :] Calf Left: Right: Point of Measurement: 36 cm From Medial Instep 38.5 cm cm Ankle Left: Right: Point of Measurement: 10 cm From Medial Instep 25.9 cm cm Vascular Assessment Pulses: Dorsalis Pedis Palpable: [Left:Yes] [Right:Yes] Posterior Tibial Extremity colors, hair growth, and conditions: Extremity Color: [Left:Normal] [Right:Normal] Hair Growth on Extremity: [Left:Yes] [Right:Yes] Temperature of Extremity: [Left:Warm] [Right:Warm] Capillary Refill: [Left:< 3 seconds] [Right:< 3 seconds] Dependent Rubor: [Left:No] [Right:No] Blanched when Elevated: [Left:No] [Right:No] Lipodermatosclerosis: [Left:No] [Right:No] Toe Nail Assessment Left: Right: Thick: No No Discolored: No No Deformed: No No Improper Length and Hygiene: No No Electronic Signature(s) Signed:  04/14/2017 6:30:52 PM By: Gretta Cool, BSN, RN, CWS, Kim RN, BSN [place], [place]wyn ([id number]) Entered By: Gretta Cool, BSN, RN, CWS, Kim on 04/14/2017 11:37:42 Benjamin Soto ([id number]) -------------------------------------------------------------------------------- Multi Wound Chart Details Patient Name: Benjamin Soto Date of Service: 04/14/2017 11:00 AM Medical Record Number: [id number] Patient Account Number: [id number] Date of Birth/Sex: 09-04-1936 (81 y.o. Male) Treating RN: Cornell Barman Primary Care Encarnacion Scioneaux: Elsie Stain Other Clinician: Referring Byanka Landrus: Elsie Stain Treating Valisa Karpel/Extender: Ricard Dillon Weeks in Treatment: 9 Vital Signs Height(in): 69 Pulse(bpm): 68 Weight(lbs): 185 Blood Pressure 93/74 (mmHg): Body Mass Index(BMI): 27 Temperature(F): 97.9 Respiratory Rate 16 (breaths/min):  Photos: [N/A:N/A] Wound Location: Left Lower Leg - Midline N/A N/A Wounding Event: Trauma N/A N/A Primary Etiology: Trauma, Other N/A N/A Comorbid History: Glaucoma, Chronic N/A N/A Obstructive Pulmonary Disease (COPD), Arrhythmia, Coronary Artery Disease, Hypertension, Type II Diabetes, Osteoarthritis, Neuropathy Date Acquired: 01/23/2017 N/A N/A Weeks of Treatment: 9 N/A N/A Wound Status: Open N/A N/A Measurements L x W x D 4.5x2.2x0.1 N/A N/A (cm) Area (cm) : 7.775 N/A N/A Volume (cm) : 0.778 N/A N/A % Reduction in Area: -54.70% N/A N/A % Reduction in Volume: -54.70% N/A N/A Classification: Full Thickness Without N/A N/A Exposed Support Structures Osborne, Rosalee Kaufman ([id number]) HBO Classification: Grade 1 N/A N/A Exudate Amount: Medium N/A N/A Exudate Type: Serosanguineous N/A N/A Exudate Color: red, brown N/A N/A Wound Margin: Flat and Intact N/A N/A Granulation Amount: Medium (34-66%) N/A N/A Granulation Quality: Red, Hyper-granulation N/A N/A Necrotic Amount: Medium (34-66%) N/A N/A Exposed Structures: Fat Layer (Subcutaneous N/A N/A Tissue) Exposed:  Yes Fascia: No Tendon: No Muscle: No Joint: No Bone: No Epithelialization: Small (1-33%) N/A N/A Debridement: Debridement ([id number]- N/A N/A 11047) Pre-procedure 11:11 N/A N/A Verification/Time Out Taken: Pain Control: Other N/A N/A Tissue Debrided: Fibrin/Slough, Skin, N/A N/A Subcutaneous Level: Skin/Subcutaneous N/A N/A Tissue Debridement Area (sq 9.9 N/A N/A cm): Instrument: Curette N/A N/A Bleeding: Moderate N/A N/A Hemostasis Achieved: Pressure N/A N/A Procedural Pain: 1 N/A N/A Post Procedural Pain: 1 N/A N/A Debridement Treatment Procedure was tolerated N/A N/A Response: well Post Debridement 4.5x2.2x0.2 N/A N/A Measurements L x W x D (cm) Post Debridement 1.555 N/A N/A Volume: (cm) Periwound Skin Texture: Excoriation: Yes N/A N/A Induration: Yes Callus: No Crepitus: No Rash: No Scarring: No Periwound Skin Maceration: No N/A N/A Moisture: Dry/Scaly: No Periwound Skin Color: N/A N/A Benjamin Soto, Benjamin Soto ([id number]) Ecchymosis: Yes Erythema: Yes Atrophie Blanche: No Cyanosis: No Hemosiderin Staining: No Mottled: No Pallor: No Rubor: No Erythema Location: Circumferential N/A N/A Temperature: No Abnormality N/A N/A Tenderness on Yes N/A N/A Palpation: Wound Preparation: Ulcer Cleansing: Other: N/A N/A soap and water Topical Anesthetic Applied: Other: lidociane 4% Procedures Performed: Debridement N/A N/A Treatment Notes Wound #1 (Left, Midline Lower Leg) 1. Cleansed with: Clean wound with Normal Saline 2. Anesthetic Topical Lidocaine 4% cream to wound bed prior to debridement 3. Peri-wound Care: Barrier cream 4. Dressing Applied: Other dressing (specify in notes) 5. Secondary Dressing Applied ABD and Kerlix/Conform Notes Endoform Electronic Signature(s) Signed: 04/14/2017 6:25:53 PM By: Linton Ham Benjamin Soto Entered By: Linton Ham on 04/14/2017 12:16:31 Benjamin Soto  ([id number]) -------------------------------------------------------------------------------- Multi-Disciplinary Care Plan Details Patient Name: Benjamin Soto Date of Service: 04/14/2017 11:00 AM Medical Record Number: [id number] Patient Account Number: [id number] Date of Birth/Sex: Apr 23, 1936 (81 y.o. Male) Treating RN: Cornell Barman Primary Care Adolphus Hanf: Elsie Stain Other Clinician: Referring Zion Lint: Elsie Stain Treating Julliana Whitmyer/Extender: Tito Dine in Treatment: 9 Active Inactive ` Abuse / Safety / Falls / Self Care Management Nursing Diagnoses: Potential for falls Goals: Patient will not experience any injury related to falls Date Initiated: 02/09/2017 Target Resolution Date: 04/23/2017 Goal Status: Active Interventions: Assess fall risk on admission and as needed Notes: ` Orientation to the Wound Care Program Nursing Diagnoses: Knowledge deficit related to the wound healing center program Goals: Patient/caregiver will verbalize understanding of the [facility] Program Date Initiated: 02/09/2017 Target Resolution Date: 04/23/2017 Goal Status: Active Interventions: Provide education on orientation to the wound center Notes: ` Wound/Skin Impairment Nursing Diagnoses: Impaired tissue integrity Benjamin Soto, Benjamin Soto ([id number]) Goals: Patient/caregiver will verbalize understanding of skin care regimen Date Initiated:  02/09/2017 Target Resolution Date: 04/23/2017 Goal Status: Active Ulcer/skin breakdown will have a volume reduction of 30% by week 4 Date Initiated: 02/09/2017 Target Resolution Date: 04/23/2017 Goal Status: Active Ulcer/skin breakdown will have a volume reduction of 50% by week 8 Date Initiated: 02/09/2017 Target Resolution Date: 04/23/2017 Goal Status: Active Ulcer/skin breakdown will have a volume reduction of 80% by week 12 Date Initiated: 02/09/2017 Target Resolution Date: 04/23/2017 Goal Status: Active Ulcer/skin breakdown will heal  within 14 weeks Date Initiated: 02/09/2017 Target Resolution Date: 04/23/2017 Goal Status: Active Interventions: Assess patient/caregiver ability to obtain necessary supplies Assess patient/caregiver ability to perform ulcer/skin care regimen upon admission and as needed Assess ulceration(s) every visit Notes: Electronic Signature(s) Signed: 04/14/2017 6:30:52 PM By: Gretta Cool, BSN, RN, CWS, Kim RN, BSN Entered By: Gretta Cool, BSN, RN, CWS, Kim on 04/14/2017 11:37:49 Benjamin Soto ([id number]) -------------------------------------------------------------------------------- Pain Assessment Details Patient Name: Benjamin Soto Date of Service: 04/14/2017 11:00 AM Medical Record Number: [id number] Patient Account Number: [id number] Date of Birth/Sex: Jul 21, 1936 (81 y.o. Male) Treating RN: Cornell Barman Primary Care Kyre Jeffries: Elsie Stain Other Clinician: Referring Elad Macphail: Elsie Stain Treating Jalayah Gutridge/Extender: Ricard Dillon Weeks in Treatment: 9 Active Problems Location of Pain Severity and Description of Pain Patient Has Paino No Site Locations With Dressing Change: No Pain Management and Medication Current Pain Management: Goals for Pain Management Topical or injectable lidocaine is offered to patient for acute pain when surgical debridement is performed. If needed, Patient is instructed to use over the counter pain medication for the following 24-48 hours after debridement. Wound care MDs do not prescribed pain medications. Patient has chronic pain or uncontrolled pain. Patient has been instructed to make an appointment with their Primary Care Physician for pain management Electronic Signature(s) Signed: 04/14/2017 6:30:52 PM By: Gretta Cool, BSN, RN, CWS, Kim RN, BSN Entered By: Gretta Cool, BSN, RN, CWS, Kim on 04/14/2017 11:05:30 Benjamin Soto ([id number]) -------------------------------------------------------------------------------- Patient/Caregiver Education Details Patient Name:  Benjamin Soto Date of Service: 04/14/2017 11:00 AM Medical Record Number: [id number] Patient Account Number: [id number] Date of Birth/Gender: [birth year] (81 y.o. Male) Treating RN: Cornell Barman Primary Care Physician: Elsie Stain Other Clinician: Referring Physician: Elsie Stain Treating Physician/Extender: Tito Dine in Treatment: 9 Education Assessment Education Provided To: Patient Education Topics Provided Wound/Skin Impairment: Handouts: Caring for Your Ulcer, Other: dressing changes as prescribed Methods: Demonstration Responses: State content correctly Electronic Signature(s) Signed: 04/14/2017 6:30:52 PM By: Gretta Cool, BSN, RN, CWS, Kim RN, BSN Entered By: Gretta Cool, BSN, RN, CWS, Kim on 04/14/2017 12:15:26 Benjamin Soto ([id number]) -------------------------------------------------------------------------------- Wound Assessment Details Patient Name: Benjamin Soto Date of Service: 04/14/2017 11:00 AM Medical Record Number: [id number] Patient Account Number: [id number] Date of Birth/Sex: May 06, 1936 (81 y.o. Male) Treating RN: Cornell Barman Primary Care Laden Fieldhouse: Elsie Stain Other Clinician: Referring Caleyah Jr: Elsie Stain Treating Kadasia Kassing/Extender: Ricard Dillon Weeks in Treatment: 9 Wound Status Wound Number: 1 Primary Trauma, Other Etiology: Wound Location: Left Lower Leg - Midline Wound Open Wounding Event: Trauma Status: Date Acquired: 01/23/2017 Comorbid Glaucoma, Chronic Obstructive Weeks Of Treatment: 9 History: Pulmonary Disease (COPD), Arrhythmia, Clustered Wound: No Coronary Artery Disease, Hypertension, Type II Diabetes, Osteoarthritis, Neuropathy Photos Wound Measurements Length: (cm) 4.5 Width: (cm) 2.2 Depth: (cm) 0.1 Area: (cm) 7.775 Volume: (cm) 0.778 % Reduction in Area: -54.7% % Reduction in Volume: -54.7% Epithelialization: Small (1-33%) Tunneling: No Undermining: No Wound Description Full Thickness Without  Exposed Foul Odor A Classification: Support Structures Slough/Fibr Diabetic Severity Grade 1 (Wagner): Wound Margin: Flat and Intact Exudate Amount: Medium Exudate Type: Serosanguineous Exudate Color:  red, brown fter Cleansing: No ino Yes Wound Bed Benjamin Soto, Benjamin Soto ([id number]) Granulation Amount: Medium (34-66%) Exposed Structure Granulation Quality: Red, Hyper-granulation Fascia Exposed: No Necrotic Amount: Medium (34-66%) Fat Layer (Subcutaneous Tissue) Exposed: Yes Necrotic Quality: Adherent Slough Tendon Exposed: No Muscle Exposed: No Joint Exposed: No Bone Exposed: No Periwound Skin Texture Texture Color No Abnormalities Noted: No No Abnormalities Noted: No Callus: No Atrophie Blanche: No Crepitus: No Cyanosis: No Excoriation: Yes Ecchymosis: Yes Induration: Yes Erythema: Yes Rash: No Erythema Location: Circumferential Scarring: No Hemosiderin Staining: No Mottled: No Moisture Pallor: No No Abnormalities Noted: No Rubor: No Dry / Scaly: No Maceration: No Temperature / Pain Temperature: No Abnormality Tenderness on Palpation: Yes Wound Preparation Ulcer Cleansing: Other: soap and water, Topical Anesthetic Applied: Other: lidociane 4%, Treatment Notes Wound #1 (Left, Midline Lower Leg) 1. Cleansed with: Clean wound with Normal Saline 2. Anesthetic Topical Lidocaine 4% cream to wound bed prior to debridement 3. Peri-wound Care: Barrier cream 4. Dressing Applied: Other dressing (specify in notes) 5. Secondary Dressing Applied ABD and Kerlix/Conform Notes Endoform Electronic Signature(s) Signed: 04/14/2017 6:30:52 PM By: Gretta Cool, BSN, RN, CWS, Kim RN, BSN Entered By: Gretta Cool, BSN, RN, CWS, Kim on 04/14/2017 12:10:03 Benjamin Soto ([id number]) Benjamin Soto, Benjamin Soto ([id number]) -------------------------------------------------------------------------------- [place] Details Patient Name: Benjamin Soto Date of Service: 04/14/2017 11:00 AM Medical Record  Number: [id number] Patient Account Number: [id number] Date of Birth/Sex: 10-Sep-1936 (81 y.o. Male) Treating RN: Cornell Barman Primary Care Calayah Guadarrama: Elsie Stain Other Clinician: Referring Karan Inclan: Elsie Stain Treating Ruslan Mccabe/Extender: Ricard Dillon Weeks in Treatment: 9 Vital Signs Time Taken: 11:05 Temperature (F): 97.9 Height (in): 69 Pulse (bpm): 68 Weight (lbs): 185 Respiratory Rate (breaths/min): 16 Body Mass Index (BMI): 27.3 Blood Pressure (mmHg): 93/74 Reference Range: 80 - 120 mg / dl Electronic Signature(s) Signed: 04/14/2017 6:30:52 PM By: Gretta Cool, BSN, RN, CWS, Kim RN, BSN Entered By: Gretta Cool, BSN, RN, CWS, Kim on 04/14/2017 11:05:51

## 2017-04-18 ENCOUNTER — Other Ambulatory Visit: Payer: Self-pay | Admitting: Family Medicine

## 2017-04-18 NOTE — Progress Notes (Signed)
Agree, thanks

## 2017-04-19 NOTE — Telephone Encounter (Signed)
Patient is compliant with coumadin management will refill X 6 months.   

## 2017-04-21 ENCOUNTER — Encounter: Payer: Medicare Other | Attending: Internal Medicine | Admitting: Internal Medicine

## 2017-04-21 DIAGNOSIS — E11622 Type 2 diabetes mellitus with other skin ulcer: Secondary | ICD-10-CM | POA: Insufficient documentation

## 2017-04-21 DIAGNOSIS — I509 Heart failure, unspecified: Secondary | ICD-10-CM | POA: Insufficient documentation

## 2017-04-21 DIAGNOSIS — N182 Chronic kidney disease, stage 2 (mild): Secondary | ICD-10-CM | POA: Diagnosis not present

## 2017-04-21 DIAGNOSIS — J449 Chronic obstructive pulmonary disease, unspecified: Secondary | ICD-10-CM | POA: Diagnosis not present

## 2017-04-21 DIAGNOSIS — I110 Hypertensive heart disease with heart failure: Secondary | ICD-10-CM | POA: Insufficient documentation

## 2017-04-21 DIAGNOSIS — I2510 Atherosclerotic heart disease of native coronary artery without angina pectoris: Secondary | ICD-10-CM | POA: Insufficient documentation

## 2017-04-21 DIAGNOSIS — F17200 Nicotine dependence, unspecified, uncomplicated: Secondary | ICD-10-CM | POA: Diagnosis not present

## 2017-04-21 DIAGNOSIS — I4891 Unspecified atrial fibrillation: Secondary | ICD-10-CM | POA: Insufficient documentation

## 2017-04-21 DIAGNOSIS — E1140 Type 2 diabetes mellitus with diabetic neuropathy, unspecified: Secondary | ICD-10-CM | POA: Insufficient documentation

## 2017-04-21 DIAGNOSIS — L97223 Non-pressure chronic ulcer of left calf with necrosis of muscle: Secondary | ICD-10-CM | POA: Diagnosis not present

## 2017-04-21 DIAGNOSIS — L97823 Non-pressure chronic ulcer of other part of left lower leg with necrosis of muscle: Secondary | ICD-10-CM | POA: Diagnosis not present

## 2017-04-21 DIAGNOSIS — E1122 Type 2 diabetes mellitus with diabetic chronic kidney disease: Secondary | ICD-10-CM | POA: Insufficient documentation

## 2017-04-21 DIAGNOSIS — Z7901 Long term (current) use of anticoagulants: Secondary | ICD-10-CM | POA: Insufficient documentation

## 2017-04-21 DIAGNOSIS — W19XXXA Unspecified fall, initial encounter: Secondary | ICD-10-CM | POA: Diagnosis not present

## 2017-04-21 DIAGNOSIS — S81802A Unspecified open wound, left lower leg, initial encounter: Secondary | ICD-10-CM | POA: Diagnosis not present

## 2017-04-21 DIAGNOSIS — I130 Hypertensive heart and chronic kidney disease with heart failure and stage 1 through stage 4 chronic kidney disease, or unspecified chronic kidney disease: Secondary | ICD-10-CM | POA: Insufficient documentation

## 2017-04-21 DIAGNOSIS — S81812A Laceration without foreign body, left lower leg, initial encounter: Secondary | ICD-10-CM | POA: Diagnosis not present

## 2017-04-21 DIAGNOSIS — L03116 Cellulitis of left lower limb: Secondary | ICD-10-CM | POA: Diagnosis not present

## 2017-04-23 NOTE — Progress Notes (Signed)
[name], [name] ([id number]) Visit Report for 04/21/2017 Arrival Information Details Patient Name: Benjamin Soto, Benjamin Soto Date of Service: 04/21/2017 9:15 AM Medical Record Number: [id number] Patient Account Number: [id number] Date of Birth/Sex: 1936/01/01 (81 y.o. Male) Treating RN: Cornell Barman Primary Care Hamzah Savoca: Elsie Stain Other Clinician: Referring Isayah Ignasiak: Elsie Stain Treating Tanaisha Pittman/Extender: Tito Dine in Treatment: 10 Visit Information History Since Last Visit Added or deleted any medications: No Patient Arrived: Ambulatory Any new allergies or adverse reactions: No Arrival Time: 09:09 Had a fall or experienced change in No Accompanied By: wife activities of daily living that may affect Transfer Assistance: None risk of falls: Patient Identification Verified: Yes Signs or symptoms of abuse/neglect since last No Secondary Verification Process Yes visito Completed: Hospitalized since last visit: No Patient Requires Transmission- No Has Dressing in Place as Prescribed: Yes Based Precautions: Pain Present Now: No Patient Has Alerts: Yes Patient Alerts: Patient on Blood Thinner warfarin Type II Diabetic Electronic Signature(s) Signed: 04/22/2017 10:47:32 AM By: Gretta Cool, BSN, RN, CWS, Kim RN, BSN Entered By: Gretta Cool, BSN, RN, CWS, Kim on 04/21/2017 09:10:02 Benjamin Soto ([id number]) -------------------------------------------------------------------------------- Clinic Level of Care Assessment Details Patient Name: Benjamin Soto Date of Service: 04/21/2017 9:15 AM Medical Record Number: [id number] Patient Account Number: [id number] Date of Birth/Sex: Sep 18, 1936 (81 y.o. Male) Treating RN: Cornell Barman Primary Care Alaiyah Bollman: Elsie Stain Other Clinician: Referring Jhovanny Guinta: Elsie Stain Treating Melbourne Jakubiak/Extender: Ricard Dillon Weeks in Treatment: 10 Clinic Level of Care Assessment Items TOOL 4 Quantity Score []  - Use when only an EandM is performed  on FOLLOW-UP visit 0 ASSESSMENTS - Nursing Assessment / Reassessment []  - Reassessment of Co-morbidities (includes updates in patient status) 0 X - Reassessment of Adherence to Treatment Plan 1 5 ASSESSMENTS - Wound and Skin Assessment / Reassessment X - Simple Wound Assessment / Reassessment - one wound 1 5 []  - Complex Wound Assessment / Reassessment - multiple wounds 0 []  - Dermatologic / Skin Assessment (not related to wound area) 0 ASSESSMENTS - Focused Assessment []  - Circumferential Edema Measurements - multi extremities 0 []  - Nutritional Assessment / Counseling / Intervention 0 []  - Lower Extremity Assessment (monofilament, tuning fork, pulses) 0 []  - Peripheral Arterial Disease Assessment (using hand held doppler) 0 ASSESSMENTS - Ostomy and/or Continence Assessment and Care []  - Incontinence Assessment and Management 0 []  - Ostomy Care Assessment and Management (repouching, etc.) 0 PROCESS - Coordination of Care X - Simple Patient / Family Education for ongoing care 1 15 []  - Complex (extensive) Patient / Family Education for ongoing care 0 X - Staff obtains Programmer, systems, Records, Test Results / Process Orders 1 10 []  - Staff telephones HHA, Nursing Homes / Clarify orders / etc 0 []  - Routine Transfer to another Facility (non-emergent condition) 0 Rodell, Herson ([id number]) []  - Routine Hospital Admission (non-emergent condition) 0 []  - New Admissions / Biomedical engineer / Ordering NPWT, Apligraf, etc. 0 []  - Emergency Hospital Admission (emergent condition) 0 X - Simple Discharge Coordination 1 10 []  - Complex (extensive) Discharge Coordination 0 PROCESS - Special Needs []  - Pediatric / Minor Patient Management 0 []  - Isolation Patient Management 0 []  - Hearing / Language / Visual special needs 0 []  - Assessment of Community assistance (transportation, D/C planning, etc.) 0 []  - Additional assistance / Altered mentation 0 []  - Support Surface(s) Assessment (bed,  cushion, seat, etc.) 0 INTERVENTIONS - Wound Cleansing / Measurement X - Simple Wound Cleansing - one wound 1 5 []  - Complex Wound Cleansing - multiple wounds  0 X - Wound Imaging (photographs - any number of wounds) 1 5 []  - Wound Tracing (instead of photographs) 0 X - Simple Wound Measurement - one wound 1 5 []  - Complex Wound Measurement - multiple wounds 0 INTERVENTIONS - Wound Dressings []  - Small Wound Dressing one or multiple wounds 0 X - Medium Wound Dressing one or multiple wounds 1 15 []  - Large Wound Dressing one or multiple wounds 0 []  - Application of Medications - topical 0 []  - Application of Medications - injection 0 INTERVENTIONS - Miscellaneous []  - External ear exam 0 Branch, Jeren ([id number]) []  - Specimen Collection (cultures, biopsies, blood, body fluids, etc.) 0 []  - Specimen(s) / Culture(s) sent or taken to Lab for analysis 0 []  - Patient Transfer (multiple staff / Harrel Lemon Lift / Similar devices) 0 []  - Simple Staple / Suture removal (25 or less) 0 []  - Complex Staple / Suture removal (26 or more) 0 []  - Hypo / Hyperglycemic Management (close monitor of Blood Glucose) 0 []  - Ankle / Brachial Index (ABI) - do not check if billed separately 0 X - Vital Signs 1 5 Has the patient been seen at the hospital within the last three years: Yes Total Score: 80 Level Of Care: New/Established - Level 3 Electronic Signature(s) Signed: 04/22/2017 10:47:32 AM By: Gretta Cool, BSN, RN, CWS, Kim RN, BSN Entered By: Gretta Cool, BSN, RN, CWS, Kim on 04/21/2017 09:36:00 Benjamin Soto ([id number]) -------------------------------------------------------------------------------- Encounter Discharge Information Details Patient Name: Benjamin Soto Date of Service: 04/21/2017 9:15 AM Medical Record Number: [id number] Patient Account Number: [id number] Date of Birth/Sex: 12/08/1935 (81 y.o. Male) Treating RN: Cornell Barman Primary Care Chavez Rosol: Elsie Stain Other Clinician: Referring Floy Angert:  Elsie Stain Treating Johnavon Mcclafferty/Extender: Tito Dine in Treatment: 10 Encounter Discharge Information Items Discharge Pain Level: 0 Discharge Condition: Stable Ambulatory Status: Ambulatory Discharge Destination: Home Private Transportation: Auto Accompanied By: wife Schedule Follow-up Appointment: Yes Medication Reconciliation completed and Yes provided to Patient/Care Cordarrius Coad: Clinical Summary of Care: Electronic Signature(s) Signed: 04/22/2017 10:47:32 AM By: Gretta Cool, BSN, RN, CWS, Kim RN, BSN Previous Signature: 04/21/2017 9:38:02 AM Version By: Ruthine Dose Entered By: Gretta Cool, BSN, RN, CWS, Kim on 04/21/2017 09:38:14 Benjamin Soto ([id number]) -------------------------------------------------------------------------------- Lower Extremity Assessment Details Patient Name: Benjamin Soto Date of Service: 04/21/2017 9:15 AM Medical Record Number: [id number] Patient Account Number: [id number] Date of Birth/Sex: 1936/04/14 (81 y.o. Male) Treating RN: Cornell Barman Primary Care Shivank Pinedo: Elsie Stain Other Clinician: Referring Yona Stansbury: Elsie Stain Treating Aamari Strawderman/Extender: Ricard Dillon Weeks in Treatment: 10 Edema Assessment Assessed: [Left: No] [Right: No] E[Left: dema] [Right: :] Calf Left: Right: Point of Measurement: 36 cm From Medial Instep 38 cm cm Ankle Left: Right: Point of Measurement: 10 cm From Medial Instep 23.5 cm cm Vascular Assessment Pulses: Dorsalis Pedis Palpable: [Left:Yes] Posterior Tibial Extremity colors, hair growth, and conditions: Extremity Color: [Left:Normal] Hair Growth on Extremity: [Left:Yes] Temperature of Extremity: [Left:Warm] Capillary Refill: [Left:> 3 seconds] Dependent Rubor: [Left:No] Blanched when Elevated: [Left:No] Lipodermatosclerosis: [Left:No] Toe Nail Assessment Left: Right: Thick: No Discolored: No Deformed: No Improper Length and Hygiene: No Electronic Signature(s) Signed: 04/22/2017 10:47:32  AM By: Gretta Cool, BSN, RN, CWS, Kim RN, BSN Reedley, Shields ([id number]) Entered By: Gretta Cool, BSN, RN, CWS, Kim on 04/21/2017 09:16:55 Benjamin Soto ([id number]) -------------------------------------------------------------------------------- Multi Wound Chart Details Patient Name: Benjamin Soto Date of Service: 04/21/2017 9:15 AM Medical Record Number: [id number] Patient Account Number: [id number] Date of Birth/Sex: 1936-07-03 (81 y.o. Male) Treating RN: Cornell Barman Primary Care Valor Quaintance: Elsie Stain Other Clinician: Referring  Timiyah Romito: Elsie Stain Treating Brode Sculley/Extender: Tito Dine in Treatment: 10 Vital Signs Height(in): 69 Pulse(bpm): 74 Weight(lbs): 185 Blood Pressure 102/61 (mmHg): Body Mass Index(BMI): 27 Temperature(F): 97.5 Respiratory Rate 16 (breaths/min): Photos: [N/A:N/A] Wound Location: Left Lower Leg - Midline N/A N/A Wounding Event: Trauma N/A N/A Primary Etiology: Trauma, Other N/A N/A Comorbid History: Glaucoma, Chronic N/A N/A Obstructive Pulmonary Disease (COPD), Arrhythmia, Coronary Artery Disease, Hypertension, Type II Diabetes, Osteoarthritis, Neuropathy Date Acquired: 01/23/2017 N/A N/A Weeks of Treatment: 10 N/A N/A Wound Status: Open N/A N/A Measurements L x W x D 4.2x1.5x0.1 N/A N/A (cm) Area (cm) : 4.948 N/A N/A Volume (cm) : 0.495 N/A N/A % Reduction in Area: 1.60% N/A N/A % Reduction in Volume: 1.60% N/A N/A Classification: Full Thickness Without N/A N/A Exposed Support Structures HBO Classification: Grade 1 N/A N/A Exudate Amount: Medium N/A N/A SERAPIO, EDELSON ([id number]) Exudate Type: Serosanguineous N/A N/A Exudate Color: red, brown N/A N/A Wound Margin: Flat and Intact N/A N/A Granulation Amount: Large (67-100%) N/A N/A Granulation Quality: Red, Hyper-granulation N/A N/A Necrotic Amount: Small (1-33%) N/A N/A Exposed Structures: Fat Layer (Subcutaneous N/A N/A Tissue) Exposed: Yes Fascia: No Tendon:  No Muscle: No Joint: No Bone: No Epithelialization: Small (1-33%) N/A N/A Periwound Skin Texture: Excoriation: Yes N/A N/A Induration: Yes Callus: No Crepitus: No Rash: No Scarring: No Periwound Skin Maceration: No N/A N/A Moisture: Dry/Scaly: No Periwound Skin Color: Ecchymosis: Yes N/A N/A Atrophie Blanche: No Cyanosis: No Erythema: No Hemosiderin Staining: No Mottled: No Pallor: No Rubor: No Temperature: No Abnormality N/A N/A Tenderness on Yes N/A N/A Palpation: Wound Preparation: Ulcer Cleansing: Other: N/A N/A soap and water Topical Anesthetic Applied: Other: lidociane 4% Treatment Notes Electronic Signature(s) Signed: 04/22/2017 10:47:32 AM By: Gretta Cool, BSN, RN, CWS, Kim RN, BSN Entered By: Gretta Cool, BSN, RN, CWS, Kim on 04/21/2017 09:18:58 Benjamin Soto ([id number]) -------------------------------------------------------------------------------- Multi-Disciplinary Care Plan Details Patient Name: Benjamin Soto Date of Service: 04/21/2017 9:15 AM Medical Record Number: [id number] Patient Account Number: [id number] Date of Birth/Sex: 1936/08/02 (81 y.o. Male) Treating RN: Cornell Barman Primary Care Latrece Nitta: Elsie Stain Other Clinician: Referring Montoya Brandel: Elsie Stain Treating Mamie Diiorio/Extender: Tito Dine in Treatment: 10 Active Inactive ` Abuse / Safety / Falls / Self Care Management Nursing Diagnoses: Potential for falls Goals: Patient will not experience any injury related to falls Date Initiated: 02/09/2017 Target Resolution Date: 04/23/2017 Goal Status: Active Interventions: Assess fall risk on admission and as needed Notes: ` Orientation to the Wound Care Program Nursing Diagnoses: Knowledge deficit related to the wound healing center program Goals: Patient/caregiver will verbalize understanding of the [facility] Program Date Initiated: 02/09/2017 Target Resolution Date: 04/23/2017 Goal Status:  Active Interventions: Provide education on orientation to the wound center Notes: ` Wound/Skin Impairment Nursing Diagnoses: Impaired tissue integrity JOSEALFREDO, ADKINS ([id number]) Goals: Patient/caregiver will verbalize understanding of skin care regimen Date Initiated: 02/09/2017 Target Resolution Date: 04/23/2017 Goal Status: Active Ulcer/skin breakdown will have a volume reduction of 30% by week 4 Date Initiated: 02/09/2017 Target Resolution Date: 04/23/2017 Goal Status: Active Ulcer/skin breakdown will have a volume reduction of 50% by week 8 Date Initiated: 02/09/2017 Target Resolution Date: 04/23/2017 Goal Status: Active Ulcer/skin breakdown will have a volume reduction of 80% by week 12 Date Initiated: 02/09/2017 Target Resolution Date: 04/23/2017 Goal Status: Active Ulcer/skin breakdown will heal within 14 weeks Date Initiated: 02/09/2017 Target Resolution Date: 04/23/2017 Goal Status: Active Interventions: Assess patient/caregiver ability to obtain necessary supplies Assess patient/caregiver ability to perform ulcer/skin care regimen upon admission  and as needed Assess ulceration(s) every visit Notes: Electronic Signature(s) Signed: 04/22/2017 10:47:32 AM By: Gretta Cool, BSN, RN, CWS, Kim RN, BSN Entered By: Gretta Cool, BSN, RN, CWS, Kim on 04/21/2017 09:18:50 Benjamin Soto ([id number]) -------------------------------------------------------------------------------- Pain Assessment Details Patient Name: Benjamin Soto Date of Service: 04/21/2017 9:15 AM Medical Record Number: [id number] Patient Account Number: [id number] Date of Birth/Sex: 03/07/1936 (81 y.o. Male) Treating RN: Cornell Barman Primary Care Lynnwood Beckford: Elsie Stain Other Clinician: Referring Oluwatamilore Starnes: Elsie Stain Treating Patti Shorb/Extender: Tito Dine in Treatment: 10 Active Problems Location of Pain Severity and Description of Pain Patient Has Paino No Site Locations With Dressing Change: No Pain  Management and Medication Current Pain Management: Electronic Signature(s) Signed: 04/22/2017 10:47:32 AM By: Gretta Cool, BSN, RN, CWS, Kim RN, BSN Entered By: Gretta Cool, BSN, RN, CWS, Kim on 04/21/2017 09:10:11 Benjamin Soto ([id number]) -------------------------------------------------------------------------------- Patient/Caregiver Education Details Patient Name: Benjamin Soto Date of Service: 04/21/2017 9:15 AM Medical Record Number: [id number] Patient Account Number: [id number] Date of Birth/Gender: 09-02-1936 (81 y.o. Male) Treating RN: Cornell Barman Primary Care Physician: Elsie Stain Other Clinician: Referring Physician: Elsie Stain Treating Physician/Extender: Tito Dine in Treatment: 10 Education Assessment Education Provided To: Patient Education Topics Provided Wound/Skin Impairment: Handouts: Caring for Your Ulcer, Other: wound care as prescribed Methods: Demonstration Responses: State content correctly Electronic Signature(s) Signed: 04/22/2017 10:47:32 AM By: Gretta Cool, BSN, RN, CWS, Kim RN, BSN Entered By: Gretta Cool, BSN, RN, CWS, Kim on 04/21/2017 09:38:33 Benjamin Soto ([id number]) -------------------------------------------------------------------------------- Wound Assessment Details Patient Name: Benjamin Soto Date of Service: 04/21/2017 9:15 AM Medical Record Number: [id number] Patient Account Number: [id number] Date of Birth/Sex: [birth year] (81 y.o. Male) Treating RN: Cornell Barman Primary Care Dawn Convery: Elsie Stain Other Clinician: Referring Knoah Nedeau: Elsie Stain Treating Ithiel Liebler/Extender: Ricard Dillon Weeks in Treatment: 10 Wound Status Wound Number: 1 Primary Trauma, Other Etiology: Wound Location: Left Lower Leg - Midline Wound Open Wounding Event: Trauma Status: Date Acquired: 01/23/2017 Comorbid Glaucoma, Chronic Obstructive Weeks Of Treatment: 10 History: Pulmonary Disease (COPD), Arrhythmia, Clustered Wound: No Coronary Artery  Disease, Hypertension, Type II Diabetes, Osteoarthritis, Neuropathy Photos Wound Measurements Length: (cm) 4.2 Width: (cm) 1.5 Depth: (cm) 0.1 Area: (cm) 4.948 Volume: (cm) 0.495 % Reduction in Area: 1.6% % Reduction in Volume: 1.6% Epithelialization: Small (1-33%) Tunneling: No Undermining: No Wound Description Full Thickness Without Exposed Foul Odor A Classification: Support Structures Slough/Fibr Diabetic Severity Grade 1 (Wagner): Wound Margin: Flat and Intact Exudate Amount: Medium Exudate Type: Serosanguineous Exudate Color: red, brown fter Cleansing: No ino Yes Wound Bed Granulation Amount: Large (67-100%) Exposed Structure Granulation Quality: Red, Hyper-granulation Fascia Exposed: No Woolum, Broderick ([id number]) Necrotic Amount: Small (1-33%) Fat Layer (Subcutaneous Tissue) Exposed: Yes Necrotic Quality: Adherent Slough Tendon Exposed: No Muscle Exposed: No Joint Exposed: No Bone Exposed: No Periwound Skin Texture Texture Color No Abnormalities Noted: No No Abnormalities Noted: No Callus: No Atrophie Blanche: No Crepitus: No Cyanosis: No Excoriation: Yes Ecchymosis: Yes Induration: Yes Erythema: No Rash: No Hemosiderin Staining: No Scarring: No Mottled: No Pallor: No Moisture Rubor: No No Abnormalities Noted: No Dry / Scaly: No Temperature / Pain Maceration: No Temperature: No Abnormality Tenderness on Palpation: Yes Wound Preparation Ulcer Cleansing: Other: soap and water, Topical Anesthetic Applied: Other: lidociane 4%, Treatment Notes Wound #1 (Left, Midline Lower Leg) 1. Cleansed with: Clean wound with Normal Saline 2. Anesthetic Topical Lidocaine 4% cream to wound bed prior to debridement 3. Peri-wound Care: Barrier cream 4. Dressing Applied: Other dressing (specify in notes) 5. Secondary Dressing Applied Kerlix/Conform Non-Adherent pad 7. Secured with  Tape Notes Endoform Electronic Signature(s) Signed: 04/22/2017  10:47:32 AM By: Gretta Cool, BSN, RN, CWS, Kim RN, BSN Entered By: Gretta Cool, BSN, RN, CWS, Kim on 04/21/2017 09:15:15 Benjamin Soto ([id number]) MINGO, SIEGERT ([id number]) -------------------------------------------------------------------------------- Vitals Details Patient Name: Benjamin Soto Date of Service: 04/21/2017 9:15 AM Medical Record Number: [id number] Patient Account Number: [id number] Date of Birth/Sex: 1936/01/12 (81 y.o. Male) Treating RN: Cornell Barman Primary Care [facility]: Elsie Stain Other Clinician: Referring Jakyrah Holladay: Elsie Stain Treating Jenna Routzahn/Extender: Ricard Dillon Weeks in Treatment: 10 Vital Signs Time Taken: 09:10 Temperature (F): 97.5 Height (in): 69 Pulse (bpm): 74 Weight (lbs): 185 Respiratory Rate (breaths/min): 16 Body Mass Index (BMI): 27.3 Blood Pressure (mmHg): 102/61 Reference Range: 80 - 120 mg / dl Electronic Signature(s) Signed: 04/22/2017 10:47:32 AM By: Gretta Cool, BSN, RN, CWS, Kim RN, BSN Entered By: Gretta Cool, BSN, RN, CWS, Kim on 04/21/2017 09:10:31

## 2017-04-26 NOTE — Progress Notes (Signed)
VASHON, RIORDAN ([id number]) Visit Report for 04/21/2017 HPI Details Patient Name: Benjamin Soto, Benjamin Soto Date of Service: 04/21/2017 9:15 AM Medical Record Number: [id number] Patient Account Number: [id number] Date of Birth/Sex: January 09, 1936 (81 y.o. Male) Treating RN: Cornell Barman Primary Care Provider: Elsie Stain Other Clinician: Referring Provider: Elsie Stain Treating Provider/Extender: Ricard Dillon Weeks in Treatment: 10 History of Present Illness HPI Description: 02/09/17; 81 year old man with a hx of type 2 DM, smoker. He suffered a fall 17 days ago while working on the [employer] of his trailer. He didn't realized anything had happed until he returned home. Has been applying polysporin. Received Cephalexin finishes on Saturday. No prior wound history. ABI's 0.96 on the left. No known hx of PAD. Hx of Afib on Coumadin,chf,st 2 crf 02/16/17; patient arrives today with the condition of his wound improved he is on silver alginate. Culture of the divot medially last week grew 2 different types of enterococcus including Enterococcus faecalis. He is on Augmentin. Surface of the wound looks better. Still debridement required today 02/23/17; traumatic wound on the left anterior leg. He has finished Augmentin. He found debridement last week painful fortunately none was required today 03/02/17; traumatic wound on the left anterior leg. Still 75% necrotic surface material requiring debridement. Most of the rest that it is stable 03/09/17; initially traumatic wound on his left leg. Arrives today with complaints of more pain and more erythema around the wound. States the pain is severe enough that it keeps him awake at night. The original wound surface itself looks good and the divot that was laterally has closed over. We switched him to [facility] last week. Wife shows me a lot of drainage coming out of the wound surface 03/16/17; the patient comes in today with a better-looking wound circumference no  erythema. He is completed the doxycycline I gave him. He still complains of a lot of pain which he states radiates into the foot and keeps him awake at night. It seems that the original injury happened when he fell backwards and the anterior leg came up against part of a trailer hitch is the best I'm able to determine. He also hurt his lower coccyx. We have been using silver alginate since last week largely because of the infection 03/23/17; no major change in this man's wounds. He has leaking edema fluid. Surface of the wound looks better we have been using silver alginate. Still complaining of a lot of discomfort 03/31/17 on evaluation today patient appears to be doing worse unfortunately. His wound appears to be infected and is more tender to palpation. Nothing has changed other than he has discontinued his antibiotic at this point. 04/07/17; patient continues to decline with erythema around the wound. Culture last week did not significantly culture and organism. There was no staph aureus no group A strep although the Gram stain showed abundant gram-negative rods. The only culture we did when he initially came in here showed both enterococcus and Enterobacter. I gave him a course of Augmentin and things really seemed to settle down although I don't know that the Enterobacter would've been treated well. He still complains of a lot of pain. He a lot of complaints about the wrap 04/14/17; the patient has a better-looking wound today however he has less edema control now that we are not putting him under compression. X-ray I did last week was negative. Culture and sensitivity negative. No need for further antibiotics 04/21/17; the patient arrives today with a better-looking wound. Healthy granulation and  reduction in the width of the wound. He did not do well with [facility] we have been using Endo form Benjamin Soto, Benjamin Soto ([id number]) Electronic Signature(s) Signed: 04/22/2017 3:50:17 PM By: Linton Ham MD Entered By: Linton Ham on 04/21/2017 09:33:23 Benjamin Soto, Benjamin Soto ([id number]) -------------------------------------------------------------------------------- Physical Exam Details Patient Name: Benjamin Soto Date of Service: 04/21/2017 9:15 AM Medical Record Number: [id number] Patient Account Number: [id number] Date of Birth/Sex: March 27, 1936 (81 y.o. Male) Treating RN: Cornell Barman Primary Care Provider: Elsie Stain Other Clinician: Referring Provider: Elsie Stain Treating Provider/Extender: Ricard Dillon Weeks in Treatment: 10 Constitutional Sitting or standing Blood Pressure is within target range for patient.. Pulse regular and within target range for patient.Marland Kitchen Respirations regular, non-labored and within target range.. Temperature is normal and within the target range for the patient.Marland Kitchen appears in no distress. Eyes Conjunctivae clear. No discharge. Respiratory Respiratory effort is easy and symmetric bilaterally. Rate is normal at rest and on room air.. Cardiovascular Pedal pulses palpable and strong bilaterally.. Lymphatic None palpable in the popliteal or inguinal area. Psychiatric No evidence of depression, anxiety, or agitation. Calm, cooperative, and communicative. Appropriate interactions and affect.. Notes Wound exam; the surface of the wound looks excellent with healthy granulation. Wound with this better. There is no evidence of surrounding infection his edema is well controlled Electronic Signature(s) Signed: 04/22/2017 3:50:17 PM By: Linton Ham MD Entered By: Linton Ham on 04/21/2017 09:34:45 Benjamin Soto ([id number]) -------------------------------------------------------------------------------- Physician Orders Details Patient Name: Benjamin Soto Date of Service: 04/21/2017 9:15 AM Medical Record Number: [id number] Patient Account Number: [id number] Date of Birth/Sex: [birth year] (81 y.o. Male) Treating RN: Cornell Barman Primary Care  Provider: Elsie Stain Other Clinician: Referring Provider: Elsie Stain Treating Provider/Extender: Tito Dine in Treatment: 10 Verbal / Phone Orders: No Diagnosis Coding Wound Cleansing Wound #1 Left,Midline Lower Leg o Clean wound with Normal Saline. o Cleanse wound with mild soap and water Anesthetic Wound #1 Left,Midline Lower Leg o Topical Lidocaine 4% cream applied to wound bed prior to debridement Skin Barriers/Peri-Wound Care Wound #1 Left,Midline Lower Leg o Barrier cream Primary Wound Dressing Wound #1 Left,Midline Lower Leg o Other: - Endoform Secondary Dressing Wound #1 Left,Midline Lower Leg o ABD and Kerlix/Conform Dressing Change Frequency Wound #1 Left,Midline Lower Leg o Other: - Friday and Monday Follow-up Appointments Wound #1 Left,Midline Lower Leg o Return Appointment in 1 week. Edema Control Wound #1 Left,Midline Lower Leg o Elevate legs to the level of the heart and pump ankles as often as possible Additional Orders / Instructions Benjamin Soto, Benjamin Soto ([id number]) Wound #1 Left,Midline Lower Leg o Increase protein intake. o Other: - Please add vitamin A, vitamin C and zinc supplements to your diet Electronic Signature(s) Signed: 04/22/2017 10:47:32 AM By: Gretta Cool, BSN, RN, CWS, Kim RN, BSN Signed: 04/22/2017 3:50:17 PM By: Linton Ham MD Entered By: Gretta Cool, BSN, RN, CWS, Kim on 04/21/2017 09:27:10 Benjamin Soto, Benjamin Soto ([id number]) -------------------------------------------------------------------------------- Problem List Details Patient Name: Benjamin Soto Date of Service: 04/21/2017 9:15 AM Medical Record Number: [id number] Patient Account Number: [id number] Date of Birth/Sex: [birth year] (81 y.o. 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 Male) Treating RN: Cornell Barman Primary Care Provider: Elsie Stain Other Clinician: Referring Provider: Elsie Stain Treating Provider/Extender: Ricard Dillon Weeks in Treatment: 10 Subjective History of Present Illness (HPI) 02/09/17; 81 year old man with a hx of type 2 DM, smoker. He suffered a fall 17 days ago while working on the [employer] of his trailer. He didn't realized anything had happed until he returned home. Has been applying polysporin. Received Cephalexin finishes on Saturday. No prior wound history. ABI's 0.96 on the left. No known hx of PAD. Hx of Afib on Coumadin,chf,st 2 crf 02/16/17; patient arrives today with the condition of his wound improved he is on silver alginate. Culture of the divot medially last week grew 2 different types of enterococcus including Enterococcus faecalis. He is on Augmentin. Surface of the wound looks better. Still debridement required today 02/23/17; traumatic wound on the left anterior leg. He has finished Augmentin. He found debridement last week painful fortunately none was required today 03/02/17; traumatic wound on the left anterior leg. Still 75% necrotic surface material requiring  debridement. Most of the rest that it is stable 03/09/17; initially traumatic wound on his left leg. Arrives today with complaints of more pain and more erythema around the wound. States the pain is severe enough that it keeps him awake at night. The original wound surface itself looks good and the divot that was laterally has closed over. We switched him to [facility] last week. Wife shows me a lot of drainage coming out of the wound surface 03/16/17; the patient comes in today with a better-looking wound circumference no erythema. He is completed the doxycycline I gave him. He still complains of a lot of pain which he states radiates into the foot and keeps him awake at night. It seems that the original injury happened when he fell backwards and the anterior leg came up against part of a trailer hitch is the best I'm able to determine. He also hurt his lower coccyx. We have been using silver alginate since last week largely because of the infection 03/23/17; no major change in this man's wounds. He has leaking edema fluid. Surface of the wound looks better we have been using silver alginate. Still complaining of a lot of discomfort 03/31/17 on evaluation today patient appears to be doing worse unfortunately. His wound appears to be infected and is more tender to palpation. Nothing has changed other than he has discontinued his antibiotic at this point. 04/07/17; patient continues to decline with erythema around the wound. Culture last week did not significantly culture and organism. There was no staph aureus no group A strep although the Gram stain showed abundant gram-negative rods. The only culture we did when he initially came in here showed both enterococcus and Enterobacter. I gave him a course of Augmentin and things really seemed to settle down although I don't know that the Enterobacter would've been treated well. He still complains of a lot of pain. He a lot of complaints about the  wrap 04/14/17; the patient has a better-looking wound today however he has less edema control now that we are not putting him under compression. X-ray I did last week was negative. Culture and sensitivity negative. No need for further antibiotics 04/21/17; the patient  arrives today with a better-looking wound. Healthy granulation and reduction in the width of the wound. He did not do well with [facility] we have been using Endo form Benjamin Soto, Benjamin Soto ([id number]) Objective Constitutional Sitting or standing Blood Pressure is within target range for patient.. Pulse regular and within target range for patient.Marland Kitchen Respirations regular, non-labored and within target range.. Temperature is normal and within the target range for the patient.Marland Kitchen appears in no distress. Vitals Time Taken: 9:10 AM, Height: 69 in, Weight: 185 lbs, BMI: 27.3, Temperature: 97.5 F, Pulse: 74 bpm, Respiratory Rate: 16 breaths/min, Blood Pressure: 102/61 mmHg. Eyes Conjunctivae clear. No discharge. Respiratory Respiratory effort is easy and symmetric bilaterally. Rate is normal at rest and on room air.. Cardiovascular Pedal pulses palpable and strong bilaterally.. Lymphatic None palpable in the popliteal or inguinal area. Psychiatric No evidence of depression, anxiety, or agitation. Calm, cooperative, and communicative. Appropriate interactions and affect.. General Notes: Wound exam; the surface of the wound looks excellent with healthy granulation. Wound with this better. There is no evidence of surrounding infection his edema is well controlled Integumentary (Hair, Skin) Wound #1 status is Open. Original cause of wound was Trauma. The wound is located on the Left,Midline Lower Leg. The wound measures 4.2cm length x 1.5cm width x 0.1cm depth; 4.948cm^2 area and 0.495cm^3 volume. There is Fat Layer (Subcutaneous Tissue) Exposed exposed. There is no tunneling or undermining noted. There is a medium amount of  serosanguineous drainage noted. The wound margin is flat and intact. There is large (67-100%) red granulation within the wound bed. There is a small (1-33%) amount of necrotic tissue within the wound bed including Adherent Slough. The periwound skin appearance exhibited: Excoriation, Induration, Ecchymosis. The periwound skin appearance did not exhibit: Callus, Crepitus, Rash, Scarring, Dry/Scaly, Maceration, Atrophie Blanche, Cyanosis, Hemosiderin Staining, Mottled, Pallor, Rubor, Erythema. Periwound temperature was noted as No Abnormality. The periwound has tenderness on palpation. 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 Cleanse wound with mild soap and water Anesthetic: Wound #1 Left,Midline Lower Leg: Topical Lidocaine 4% cream applied to wound bed prior to debridement Skin Barriers/Peri-Wound Care: Wound #1 Left,Midline Lower Leg: Barrier cream Primary Wound Dressing: Wound #1 Left,Midline Lower Leg: Other: - Endoform Secondary Dressing: Wound #1 Left,Midline Lower Leg: ABD and Kerlix/Conform Dressing Change Frequency: Wound #1 Left,Midline Lower Leg: Other: - Friday and Monday Follow-up Appointments: Wound #1 Left,Midline Lower Leg: Return Appointment in 1 week. Edema Control: Wound #1 Left,Midline Lower Leg: Elevate legs to the level of the heart and pump ankles as often as possible Additional Orders / Instructions: Wound #1 Left,Midline Lower Leg: Increase protein intake. Other: - Please add vitamin A, vitamin C and zinc supplements to your diet Benjamin Soto, Benjamin Soto ([id number]) #1 continue with endo-form, ABDs, Kerlix and conform #2 we have finally managed to turn the  corner here with this difficult laceration type injury that initially had underlying infection and a very difficult necrotic surface #3 my plan would be to continue with the Endo form as long as the surface of this wound continues to look at its greatest that is the day and at the overall wound dimensions are improving Electronic Signature(s) Signed: 04/22/2017 3:50:17 PM By: Linton Ham MD Entered By: Linton Ham on 04/21/2017 09:36:41 Benjamin Soto, Benjamin Soto (  [id number]) -------------------------------------------------------------------------------- SuperBill Details Patient Name: Benjamin Soto, Benjamin Soto Date of Service: 04/21/2017 Medical Record Number: [id number] Patient Account Number: [id number] Date of Birth/Sex: 10-04-1935 (81 y.o. 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

## 2017-04-27 ENCOUNTER — Ambulatory Visit (INDEPENDENT_AMBULATORY_CARE_PROVIDER_SITE_OTHER): Payer: Medicare Other

## 2017-04-27 DIAGNOSIS — Z5181 Encounter for therapeutic drug level monitoring: Secondary | ICD-10-CM

## 2017-04-27 LAB — POCT INR: INR: 2.1

## 2017-04-27 NOTE — Patient Instructions (Signed)
Pre visit review using our clinic review tool, if applicable. No additional management support is needed unless otherwise documented below in the visit note. 

## 2017-04-28 ENCOUNTER — Encounter: Payer: Medicare Other | Admitting: Physician Assistant

## 2017-04-28 DIAGNOSIS — J449 Chronic obstructive pulmonary disease, unspecified: Secondary | ICD-10-CM | POA: Diagnosis not present

## 2017-04-28 DIAGNOSIS — Z7901 Long term (current) use of anticoagulants: Secondary | ICD-10-CM | POA: Diagnosis not present

## 2017-04-28 DIAGNOSIS — E11622 Type 2 diabetes mellitus with other skin ulcer: Secondary | ICD-10-CM | POA: Diagnosis not present

## 2017-04-28 DIAGNOSIS — I2510 Atherosclerotic heart disease of native coronary artery without angina pectoris: Secondary | ICD-10-CM | POA: Diagnosis not present

## 2017-04-28 DIAGNOSIS — L03116 Cellulitis of left lower limb: Secondary | ICD-10-CM | POA: Diagnosis not present

## 2017-04-28 DIAGNOSIS — I130 Hypertensive heart and chronic kidney disease with heart failure and stage 1 through stage 4 chronic kidney disease, or unspecified chronic kidney disease: Secondary | ICD-10-CM | POA: Diagnosis not present

## 2017-04-28 DIAGNOSIS — E1122 Type 2 diabetes mellitus with diabetic chronic kidney disease: Secondary | ICD-10-CM | POA: Diagnosis not present

## 2017-04-28 DIAGNOSIS — L97823 Non-pressure chronic ulcer of other part of left lower leg with necrosis of muscle: Secondary | ICD-10-CM | POA: Diagnosis not present

## 2017-04-28 DIAGNOSIS — N182 Chronic kidney disease, stage 2 (mild): Secondary | ICD-10-CM | POA: Diagnosis not present

## 2017-04-28 DIAGNOSIS — S81802A Unspecified open wound, left lower leg, initial encounter: Secondary | ICD-10-CM | POA: Diagnosis not present

## 2017-04-28 DIAGNOSIS — L97223 Non-pressure chronic ulcer of left calf with necrosis of muscle: Secondary | ICD-10-CM | POA: Diagnosis not present

## 2017-04-28 DIAGNOSIS — I110 Hypertensive heart disease with heart failure: Secondary | ICD-10-CM | POA: Diagnosis not present

## 2017-04-28 DIAGNOSIS — I509 Heart failure, unspecified: Secondary | ICD-10-CM | POA: Diagnosis not present

## 2017-04-28 DIAGNOSIS — S81812A Laceration without foreign body, left lower leg, initial encounter: Secondary | ICD-10-CM | POA: Diagnosis not present

## 2017-04-28 DIAGNOSIS — I4891 Unspecified atrial fibrillation: Secondary | ICD-10-CM | POA: Diagnosis not present

## 2017-04-28 DIAGNOSIS — E1140 Type 2 diabetes mellitus with diabetic neuropathy, unspecified: Secondary | ICD-10-CM | POA: Diagnosis not present

## 2017-04-28 NOTE — Progress Notes (Signed)
Agree. Thanks

## 2017-04-30 NOTE — Progress Notes (Signed)
Benjamin, Soto ([id number]) Visit Report for 04/28/2017 Chief Complaint Document Details Patient Name: Benjamin Soto, Benjamin Soto Date of Service: 04/28/2017 9:15 AM Medical Record Number: [id number] Patient Account Number: [id number] Date of Birth/Sex: March 13, 1936 (81 y.o. Male) Treating RN: Cornell Barman Primary Care Provider: Elsie Stain Other Clinician: Referring Provider: Elsie Stain Treating Provider/Extender: Melburn Hake,  Weeks in Treatment: 11 Information Obtained from: Patient Chief Complaint 02/09/17; Patient is here for review of a traumatic wound on the left leg anterior Electronic Signature(s) Signed: 04/28/2017 6:07:57 PM By: Worthy Keeler PA-C Entered By: Worthy Keeler on 04/28/2017 09:37:45 Benjamin Soto ([id number]) -------------------------------------------------------------------------------- Debridement Details Patient Name: Benjamin Soto Date of Service: 04/28/2017 9:15 AM Medical Record Number: [id number] Patient Account Number: [id number] Date of Birth/Sex: 02-06-1936 (81 y.o. Male) Treating RN: Cornell Barman Primary Care Provider: Elsie Stain Other Clinician: Referring Provider: Elsie Stain Treating Provider/Extender: Melburn Hake,  Weeks in Treatment: 11 Debridement Performed for Wound #1 Left,Midline Lower Leg Assessment: Performed By: Physician STONE III,  E., PA-C Debridement: Debridement Pre-procedure Verification/Time Out Yes - 09:40 Taken: Start Time: 09:40 Pain Control: Other : lidocaine 4% Level: Skin/Subcutaneous Tissue Total Area Debrided (L x 2 (cm) x 1 (cm) = 2 (cm) W): Tissue and other Viable, Non-Viable, Fibrin/Slough, Subcutaneous material debrided: Instrument: Curette Bleeding: Minimum Hemostasis Achieved: Pressure End Time: 09:42 Procedural Pain: 0 Post Procedural Pain: 0 Response to Treatment: Procedure was tolerated well Post Debridement Measurements of Total Wound Length: (cm) 3.5 Width: (cm) 1.5 Depth: (cm)  0.2 Volume: (cm) 0.825 Character of Wound/Ulcer Post Requires Further Debridement Debridement: Post Procedure Diagnosis Same as Pre-procedure Electronic Signature(s) Signed: 04/28/2017 5:17:17 PM By: Gretta Cool, BSN, RN, CWS, Kim RN, BSN Signed: 04/28/2017 6:07:57 PM By: Worthy Keeler PA-C Entered By: Gretta Cool, BSN, RN, CWS, Kim on 04/28/2017 09:41:34 Benjamin Soto ([id number]) -------------------------------------------------------------------------------- HPI Details Patient Name: Benjamin Soto Date of Service: 04/28/2017 9:15 AM Medical Record Number: [id number] Patient Account Number: [id number] Date of Birth/Sex: 13-Apr-1936 (81 y.o. Male) Treating RN: Cornell Barman Primary Care Provider: Elsie Stain Other Clinician: Referring Provider: Elsie Stain Treating Provider/Extender: Melburn Hake,  Weeks in Treatment: 11 History of Present Illness HPI Description: 02/09/17; 81 year old man with a hx of type 2 DM, smoker. He suffered a fall 17 days ago while working on the [employer] of his trailer. He didn't realized anything had happed until he returned home. Has been applying polysporin. Received Cephalexin finishes on Saturday. No prior wound history. ABI's 0.96 on the left. No known hx of PAD. Hx of Afib on Coumadin,chf,st 2 crf 02/16/17; patient arrives today with the condition of his wound improved he is on silver alginate. Culture of the divot medially last week grew 2 different types of enterococcus including Enterococcus faecalis. He is on Augmentin. Surface of the wound looks better. Still debridement required today 02/23/17; traumatic wound on the left anterior leg. He has finished Augmentin. He found debridement last week painful fortunately none was required today 03/02/17; traumatic wound on the left anterior leg. Still 75% necrotic surface material requiring debridement. Most of the rest that it is stable 03/09/17; initially traumatic wound on his left leg. Arrives today with  complaints of more pain and more erythema around the wound. States the pain is severe enough that it keeps him awake at night. The original wound surface itself looks good and the divot that was laterally has closed over. We switched him to [facility] last week. Wife shows me a lot of drainage coming out of the wound surface 03/16/17;  the patient comes in today with a better-looking wound circumference no erythema. He is completed the doxycycline I gave him. He still complains of a lot of pain which he states radiates into the foot and keeps him awake at night. It seems that the original injury happened when he fell backwards and the anterior leg came up against part of a trailer hitch is the best I'm able to determine. He also hurt his lower coccyx. We have been using silver alginate since last week largely because of the infection 03/23/17; no major change in this man's wounds. He has leaking edema fluid. Surface of the wound looks better we have been using silver alginate. Still complaining of a lot of discomfort 04/28/17 on evaluation today patient's left lower extremity wound on the interior shin appears to be doing extremely well. There is slight hyper granulation but overall he has been tolerating the dressing changes without complication. There is no report of fevers, chills, nausea, vomiting, or diarrhea. Patient has minimal discomfort at most a one out of 10 and that's mainly with cleansing and debridement week by week that he knows this. 03/31/17 on evaluation today patient appears to be doing worse unfortunately. His wound appears to be infected and is more tender to palpation. Nothing has changed other than he has discontinued his antibiotic at this point. 04/07/17; patient continues to decline with erythema around the wound. Culture last week did not significantly culture and organism. There was no staph aureus no group A strep although the Gram stain showed abundant gram-negative  rods. The only culture we did when he initially came in here showed both enterococcus and Enterobacter. I gave him a course of Augmentin and things really seemed to settle down although I don't know that the Enterobacter would've been treated well. He still complains of a lot of pain. He a lot of complaints about the wrap 04/14/17; the patient has a better-looking wound today however he has less edema control now that we are not putting him under compression. X-ray I did last week was negative. Culture and sensitivity negative. No need for further antibiotics NURI, LARMER ([id number]) 04/21/17; the patient arrives today with a better-looking wound. Healthy granulation and reduction in the width of the wound. He did not do well with [facility] we have been using Endo form Electronic Signature(s) Signed: 04/28/2017 6:07:57 PM By: Worthy Keeler PA-C Entered By: Worthy Keeler on 04/28/2017 09:46:43 ROBBEN, JAGIELLO ([id number]) -------------------------------------------------------------------------------- Physical Exam Details Patient Name: Benjamin Soto Date of Service: 04/28/2017 9:15 AM Medical Record Number: [id number] Patient Account Number: [id number] Date of Birth/Sex: Feb 26, 1936 (81 y.o. Male) Treating RN: Cornell Barman Primary Care Provider: Elsie Stain Other Clinician: Referring Provider: Elsie Stain Treating Provider/Extender: Melburn Hake,  Weeks in Treatment: 25 Constitutional Well-nourished and well-hydrated in no acute distress. Respiratory normal breathing without difficulty. Psychiatric this patient is able to make decisions and demonstrates good insight into disease process. Alert and Oriented x 3. pleasant and cooperative. Notes Patient's wound has no evidence of erythema surrounding although he does have a slight amount of slough noted over the wound surface. This did require sharp debridement which he tolerated without any significant discomfort at all the  wound bed look excellent post debridement. Electronic Signature(s) Signed: 04/28/2017 6:07:57 PM By: Worthy Keeler PA-C Entered By: Worthy Keeler on 04/28/2017 09:47:27 DEARIS, DANIS ([id number]) -------------------------------------------------------------------------------- Physician Orders Details Patient Name: Benjamin Soto Date of Service: 04/28/2017 9:15 AM Medical Record Number: [id number] Patient Account Number: [id number] Date  of Birth/Sex: August 23, 1936 (81 y.o. 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 o Cleanse wound with mild soap and water Anesthetic Wound #1 Left,Midline Lower Leg o Topical Lidocaine 4% cream applied to wound bed prior to debridement Skin Barriers/Peri-Wound Care Wound #1 Left,Midline Lower Leg o Barrier cream Primary Wound Dressing Wound #1 Left,Midline Lower Leg o Other: - Endoform Secondary Dressing Wound #1 Left,Midline Lower Leg o ABD and Kerlix/Conform Dressing Change Frequency Wound #1 Left,Midline Lower Leg o Other: - Friday and Monday Follow-up Appointments Wound #1 Left,Midline Lower Leg o Return Appointment in 1 week. BRAELYN, BORDONARO ([id number]) Edema Control Wound #1 Left,Midline Lower Leg o Elevate legs to the level of the heart and pump ankles as often as possible Additional Orders / Instructions Wound #1 Left,Midline Lower Leg o Increase protein intake. o Other: - Please add vitamin A, vitamin C and zinc supplements  to your diet Electronic Signature(s) Signed: 04/28/2017 5:17:17 PM By: Gretta Cool, BSN, RN, CWS, Kim RN, BSN Signed: 04/28/2017 6:07:57 PM By: Worthy Keeler PA-C Entered By: Gretta Cool, BSN, RN, CWS, Kim on 04/28/2017 09:47:59 Benjamin Soto ([id number]) -------------------------------------------------------------------------------- Problem List Details Patient Name: Benjamin Soto Date of Service: 04/28/2017 9:15 AM Medical Record Number: [id number] Patient Account Number: [id number] Date of Birth/Sex: May 13, 1936 (81 y.o. 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 Male) Treating RN: Cornell Barman Primary Care Provider: Elsie Stain Other Clinician: Referring Provider: Elsie Stain Treating Provider/Extender: Melburn Hake,  Weeks in Treatment: 11 Subjective Chief Complaint Information obtained from Patient 02/09/17; Patient is here for review of a traumatic wound on the left leg anterior History of Present Illness (HPI) 02/09/17; 81 year old man with a hx of type 2 DM, smoker. He  suffered a fall 17 days ago while working on the [employer] of his trailer. He didn't realized anything had happed until he returned home. Has been applying polysporin. Received Cephalexin finishes on Saturday. No prior wound history. ABI's 0.96 on the left. No known hx of PAD. Hx of Afib on Coumadin,chf,st 2 crf 02/16/17; patient arrives today with the condition of his wound improved he is on silver alginate. Culture of the divot medially last week grew 2 different types of enterococcus including Enterococcus faecalis. He is on Augmentin. Surface of the wound looks better. Still debridement required today 02/23/17; traumatic wound on the left anterior leg. He has finished Augmentin. He found debridement last week painful fortunately none was required today 03/02/17; traumatic  wound on the left anterior leg. Still 75% necrotic surface material requiring debridement. Most of the rest that it is stable 03/09/17; initially traumatic wound on his left leg. Arrives today with complaints of more pain and more erythema around the wound. States the pain is severe enough that it keeps him awake at night. The original wound surface itself looks good and the divot that was laterally has closed over. We switched him to [facility] last week. Wife shows me a lot of drainage coming out of the wound surface 03/16/17; the patient comes in today with a better-looking wound circumference no erythema. He is completed the doxycycline I gave him. He still complains of a lot of pain which he states radiates into the foot and keeps him awake at night. It seems that the original injury happened when he fell backwards and the anterior leg came up against part of a trailer hitch is the best I'm able to determine. He also hurt his lower coccyx. We have been using silver alginate since last week largely because of the infection 03/23/17; no major change in this man's wounds. He has leaking edema fluid. Surface of the wound  looks better we have been using silver alginate. Still complaining of a lot of discomfort 04/28/17 on evaluation today patient's left lower extremity wound on the interior shin appears to be doing extremely well. There is slight hyper granulation but overall he has been tolerating the dressing changes without complication. There is no report of fevers, chills, nausea, vomiting, or diarrhea. Patient has minimal discomfort at most a one out of 10 and that's mainly with cleansing and debridement week by week that he knows this. 03/31/17 on evaluation today patient appears to be doing worse unfortunately. His wound appears to be infected and is more tender to palpation. Nothing has changed other than he has discontinued his antibiotic at this point. 04/07/17; patient continues to decline with erythema around the wound. Culture last week did not significantly culture and organism. There was no staph aureus no group A strep although the Gram stain showed abundant gram-negative rods. The only culture we did when he initially came in here showed both Schwartzkopf, Doris ([id number]) enterococcus and Enterobacter. I gave him a course of Augmentin and things really seemed to settle down although I don't know that the Enterobacter would've been treated well. He still complains of a lot of pain. He a lot of complaints about the wrap 04/14/17; the patient has a better-looking wound today however he has less edema control now that we are not putting him under compression. X-ray I did last week was negative. Culture and sensitivity negative. No need for further antibiotics 04/21/17; the patient arrives today with a better-looking wound. Healthy granulation and reduction in the width of the wound. He did not do well with Hydrofera Blue we have been using Endo form Objective Constitutional Well-nourished and well-hydrated in no acute distress. Vitals Time Taken: 9:27 AM, Height: 69 in, Weight: 185 lbs, BMI: 27.3,  Temperature: 97.6 F, Pulse: 72 bpm, Respiratory Rate: 16 breaths/min, Blood Pressure: 106/63 mmHg. Respiratory normal breathing without difficulty. Psychiatric this patient is able to make decisions and demonstrates good insight into disease process. Alert and Oriented x 3. pleasant and cooperative. General Notes: Patient's wound has no evidence of erythema surrounding although he does have a slight amount of slough noted over the wound surface. This did require sharp debridement which he tolerated without any significant discomfort at all the wound bed look excellent  post debridement. Integumentary (Hair, Skin) Wound #1 status is Open. Original cause of wound was Trauma. The wound is located on the Left,Midline Lower Leg. The wound measures 3.5cm length x 1.5cm width x 0.1cm depth; 4.123cm^2 area and 0.412cm^3 volume. There is Fat Layer (Subcutaneous Tissue) Exposed exposed. There is no tunneling or undermining noted. There is a medium amount of serosanguineous drainage noted. The wound margin is flat and intact. There is large (67-100%) red granulation within the wound bed. There is a small (1-33%) amount of necrotic tissue within the wound bed including Adherent Slough. The periwound skin appearance exhibited: Excoriation, Induration, Ecchymosis. The periwound skin appearance did not exhibit: Callus, Crepitus, Rash, Scarring, Dry/Scaly, Maceration, Atrophie Blanche, Cyanosis, Hemosiderin Staining, Mottled, Pallor, Rubor, Erythema. Periwound temperature was noted as No Abnormality. The periwound has tenderness on palpation. 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 There was a Skin/Subcutaneous Tissue Debridement ([id number]) debridement with total area of 2 sq cm performed by STONE III,  E., PA-C. with the following instrument(s): Curette to remove Viable and Non-Viable tissue/material including Fibrin/Slough and Subcutaneous after achieving pain control using Other (lidocaine 4%). A time out was conducted at 09:40, prior to the start of the procedure. A Minimum amount of bleeding was controlled with Pressure. The procedure was tolerated well with a pain level of 0 throughout and a pain level of 0 following the procedure. Post Debridement Measurements: 3.5cm length x 1.5cm width x 0.2cm depth; 0.825cm^3 volume. Character of Wound/Ulcer Post Debridement requires further debridement. Post procedure Diagnosis Wound #1: Same as Pre-Procedure Plan At this point I'm gonna recommend that we continue with the Current wound care measures for the next week. We will see him for reevaluation at that point to see were things stand. If anything worsened significantly in the meantime he will contact our office for additional recommendations. Please see above for specific wound care orders. Electronic Signature(s) Signed: 04/28/2017 6:07:57 PM By: Worthy Keeler PA-C Entered By: Worthy Keeler on 04/28/2017 09:48:05 KERVIN, BONES ([id number]) KOSEI, RHODES ([id number]) -------------------------------------------------------------------------------- SuperBill Details Patient Name: Benjamin Soto Date of Service: 04/28/2017 Medical Record Number: [id number] Patient Account Number: [id number] Date of Birth/Sex: [birth year] (81 y.o. Male) Treating RN: Cornell Barman Primary Care Provider: Elsie Stain Other Clinician: Referring Provider: Elsie Stain Treating Provider/Extender: Melburn Hake,  Weeks in Treatment: 11 Diagnosis Coding ICD-10 Codes Code Description S81.812D Laceration without foreign body, left lower leg, subsequent encounter L97.223  Non-pressure chronic ulcer of left calf with necrosis of muscle L03.116 Cellulitis of left lower limb Facility Procedures CPT4 Code: 69678938 Description: 10175 - DEB SUBQ TISSUE 20 SQ CM/< ICD-10 Description Diagnosis L97.223 Non-pressure chronic ulcer of left calf with necro Modifier: sis of muscle Quantity: 1 Physician Procedures CPT4 Code: [id number] Description: 77824 - WC PHYS SUBQ TISS 20 SQ CM ICD-10 Description Diagnosis L97.223 Non-pressure chronic ulcer of left calf with necro Modifier: sis of muscle Quantity: 1 Electronic Signature(s) Signed: 04/28/2017 6:07:57 PM By: Worthy Keeler PA-C Entered By: Worthy Keeler on 04/28/2017 09:48:17

## 2017-04-30 NOTE — Progress Notes (Signed)
[facility], SPECIALE ([id number]) Visit Report for 04/28/2017 Arrival Information Details Patient Name: Benjamin Soto Date of Service: 04/28/2017 9:15 AM Medical Record Number: [id number] Patient Account Number: [id number] Date of Birth/Sex: Jan 23, 1936 (81 y.o. Male) Treating RN: Benjamin Soto Primary Care Benjamin Soto: Benjamin Soto Other Clinician: Referring Benjamin Soto: Benjamin Soto Treating Eula Mazzola/Extender: Benjamin Soto Weeks in Treatment: 11 Visit Information History Since Last Visit Added or deleted any medications: No Patient Arrived: Ambulatory Any new allergies or adverse reactions: No Arrival Time: 09:26 Had a fall or experienced change in No Accompanied By: wife activities of daily living that may affect Transfer Assistance: None risk of falls: Patient Identification Verified: Yes Signs or symptoms of abuse/neglect since last No Secondary Verification Process Yes visito Completed: Hospitalized since last visit: No Patient Requires Transmission- No Has Dressing in Place as Prescribed: Yes Based Precautions: Pain Present Now: No Patient Has Alerts: Yes Patient Alerts: Patient on Blood Thinner warfarin Type II Diabetic Electronic Signature(s) Signed: 04/28/2017 5:17:17 PM By: Benjamin Soto Entered By: Benjamin Cool, BSN, RN, CWS, Kim on 04/28/2017 09:26:37 Benjamin Soto ([id number]) -------------------------------------------------------------------------------- Clinic Level of Care Assessment Details Patient Name: Benjamin Soto Date of Service: 04/28/2017 9:15 AM Medical Record Number: [id number] Patient Account Number: [id number] Date of Birth/Sex: [birth year] (81 y.o. Male) Treating RN: Benjamin Soto Primary Care Benjamin Soto: Benjamin Soto Other Clinician: Referring Benjamin Soto: Benjamin Soto Treating Benjamin Soto/Extender: Benjamin Soto Weeks in Treatment: 11 Clinic Level of Care Assessment Items TOOL 4 Quantity Score []  - Use when only an EandM is performed on  FOLLOW-UP visit 0 ASSESSMENTS - Nursing Assessment / Reassessment []  - Reassessment of Co-morbidities (includes updates in patient status) 0 X - Reassessment of Adherence to Treatment Plan 1 5 ASSESSMENTS - Wound and Skin Assessment / Reassessment X - Simple Wound Assessment / Reassessment - one wound 1 5 []  - Complex Wound Assessment / Reassessment - multiple wounds 0 []  - Dermatologic / Skin Assessment (not related to wound area) 0 ASSESSMENTS - Focused Assessment []  - Circumferential Edema Measurements - multi extremities 0 []  - Nutritional Assessment / Counseling / Intervention 0 []  - Lower Extremity Assessment (monofilament, tuning fork, pulses) 0 []  - Peripheral Arterial Disease Assessment (using hand held doppler) 0 ASSESSMENTS - Ostomy and/or Continence Assessment and Care []  - Incontinence Assessment and Management 0 []  - Ostomy Care Assessment and Management (repouching, etc.) 0 PROCESS - Coordination of Care X - Simple Patient / Family Education for ongoing care 1 15 []  - Complex (extensive) Patient / Family Education for ongoing care 0 []  - Staff obtains Programmer, systems, Records, Test Results / Process Orders 0 []  - Staff telephones HHA, Nursing Homes / Clarify orders / etc 0 []  - Routine Transfer to another Facility (non-emergent condition) 0 Benjamin Soto, Benjamin Soto ([id number]) []  - Routine Hospital Admission (non-emergent condition) 0 []  - New Admissions / Biomedical engineer / Ordering NPWT, Apligraf, etc. 0 []  - Emergency Hospital Admission (emergent condition) 0 X - Simple Discharge Coordination 1 10 []  - Complex (extensive) Discharge Coordination 0 PROCESS - Special Needs []  - Pediatric / Minor Patient Management 0 []  - Isolation Patient Management 0 []  - Hearing / Language / Visual special needs 0 []  - Assessment of Community assistance (transportation, D/C planning, etc.) 0 []  - Additional assistance / Altered mentation 0 []  - Support Surface(s) Assessment (bed, cushion,  seat, etc.) 0 INTERVENTIONS - Wound Cleansing / Measurement X - Simple Wound Cleansing - one wound 1 5 []  - Complex Wound Cleansing - multiple wounds 0  X - Wound Imaging (photographs - any number of wounds) 1 5 []  - Wound Tracing (instead of photographs) 0 X - Simple Wound Measurement - one wound 1 5 []  - Complex Wound Measurement - multiple wounds 0 INTERVENTIONS - Wound Dressings []  - Small Wound Dressing one or multiple wounds 0 X - Medium Wound Dressing one or multiple wounds 1 15 []  - Large Wound Dressing one or multiple wounds 0 []  - Application of Medications - topical 0 []  - Application of Medications - injection 0 INTERVENTIONS - Miscellaneous []  - External ear exam 0 Benjamin Soto, Benjamin Soto ([id number]) []  - Specimen Collection (cultures, biopsies, blood, body fluids, etc.) 0 []  - Specimen(s) / Culture(s) sent or taken to Lab for analysis 0 []  - Patient Transfer (multiple staff / Harrel Lemon Lift / Similar devices) 0 []  - Simple Staple / Suture removal (25 or less) 0 []  - Complex Staple / Suture removal (26 or more) 0 []  - Hypo / Hyperglycemic Management (close monitor of Blood Glucose) 0 []  - Ankle / Brachial Index (ABI) - do not check if billed separately 0 X - Vital Signs 1 5 Has the patient been seen at the hospital within the last three years: Yes Total Score: 70 Level Of Care: New/Established - Level 2 Electronic Signature(s) Signed: 04/28/2017 5:17:17 PM By: Benjamin Soto Entered By: Benjamin Cool, BSN, RN, CWS, Kim on 04/28/2017 09:48:43 Benjamin Soto ([id number]) -------------------------------------------------------------------------------- Encounter Discharge Information Details Patient Name: Benjamin Soto Date of Service: 04/28/2017 9:15 AM Medical Record Number: [id number] Patient Account Number: [id number] Date of Birth/Sex: [birth year] (81 y.o. Male) Treating RN: Benjamin Soto Primary Care Benjamin Soto: Benjamin Soto Other Clinician: Referring Jennika Ringgold: Benjamin Soto Treating Benjamin Soto/Extender: Benjamin Soto Weeks in Treatment: 11 Encounter Discharge Information Items Discharge Pain Level: 0 Discharge Condition: Stable Ambulatory Status: Ambulatory Discharge Destination: Home Transportation: Private Auto Accompanied By: wife Schedule Follow-up Appointment: Yes Medication Reconciliation completed and provided to Patient/Care Yes Maico Mulvehill: Provided on Clinical Summary of Care: 04/28/2017 Form Type Recipient Paper Patient HD Electronic Signature(s) Signed: 04/28/2017 5:17:17 PM By: Benjamin Soto Entered By: Benjamin Cool, BSN, RN, CWS, Kim on 04/28/2017 09:49:57 Benjamin Soto ([id number]) -------------------------------------------------------------------------------- Lower Extremity Assessment Details Patient Name: Benjamin Soto Date of Service: 04/28/2017 9:15 AM Medical Record Number: [id number] Patient Account Number: [id number] Date of Birth/Sex: [birth year] (81 y.o. Male) Treating RN: Benjamin Soto Primary Care Zoriana Oats: Benjamin Soto Other Clinician: Referring Dajuana Palen: Benjamin Soto Treating Sadiyah Kangas/Extender: Benjamin Soto Weeks in Treatment: 11 Edema Assessment Assessed: [Left: No] [Right: No] E[Left: dema] [Right: :] Calf Left: Right: Point of Measurement: 36 cm From Medial Instep 37 cm cm Ankle Left: Right: Point of Measurement: 10 cm From Medial Instep 23.5 cm cm Vascular Assessment Claudication: Claudication Assessment [Left:None] Pulses: Dorsalis Pedis Palpable: [Left:Yes] Posterior Tibial Palpable: [Left:Yes] Extremity colors, hair growth, and conditions: Extremity Color: [Left:Normal] Hair Growth on Extremity: [Left:Yes] Temperature of Extremity: [Left:Warm] Capillary Refill: [Left:< 3 seconds] Dependent Rubor: [Left:No] Blanched when Elevated: [Left:No] Lipodermatosclerosis: [Left:No] Toe Nail Assessment Left: Right: Thick: No Discolored: No Deformed: No Improper Length and Hygiene:  No Benjamin Soto, Benjamin Soto ([id number]) Electronic Signature(s) Signed: 04/28/2017 5:17:17 PM By: Benjamin Soto Entered By: Benjamin Cool, BSN, RN, CWS, Kim on 04/28/2017 09:34:37 Benjamin Soto ([id number]) -------------------------------------------------------------------------------- Multi Wound Chart Details Patient Name: Benjamin Soto Date of Service: 04/28/2017 9:15 AM Medical Record Number: [id number] Patient Account Number: [id number] Date of Birth/Sex: July 23, 1936 (81 y.o. Male) Treating RN: Benjamin Soto Primary Care Rehmat Murtagh:  Benjamin Soto Other Clinician: Referring Rosabelle Jupin: Benjamin Soto Treating Yenifer Saccente/Extender: Benjamin Soto Weeks in Treatment: 11 Vital Signs Height(in): 69 Pulse(bpm): 72 Weight(lbs): 185 Blood Pressure 106/63 (mmHg): Body Mass Index(BMI): 27 Temperature(F): 97.6 Respiratory Rate 16 (breaths/min): Photos: [N/A:N/A] Wound Location: Left Lower Leg - Midline N/A N/A Wounding Event: Trauma N/A N/A Primary Etiology: Trauma, Other N/A N/A Comorbid History: Glaucoma, Chronic N/A N/A Obstructive Pulmonary Disease (COPD), Arrhythmia, Coronary Artery Disease, Hypertension, Type II Diabetes, Osteoarthritis, Neuropathy Date Acquired: 01/23/2017 N/A N/A Weeks of Treatment: 11 N/A N/A Wound Status: Open N/A N/A Measurements L x W x D 3.5x1.5x0.1 N/A N/A (cm) Area (cm) : 4.123 N/A N/A Volume (cm) : 0.412 N/A N/A % Reduction in Area: 18.00% N/A N/A % Reduction in Volume: 18.10% N/A N/A Classification: Full Thickness Without N/A N/A Exposed Support Structures HBO Classification: Grade 1 N/A N/A Exudate Amount: Medium N/A N/A Benjamin Soto, Benjamin Soto ([id number]) Exudate Type: Serosanguineous N/A N/A Exudate Color: red, brown N/A N/A Wound Margin: Flat and Intact N/A N/A Granulation Amount: Large (67-100%) N/A N/A Granulation Quality: Red, Hyper-granulation N/A N/A Necrotic Amount: Small (1-33%) N/A N/A Exposed Structures: Fat Layer (Subcutaneous  N/A N/A Tissue) Exposed: Yes Fascia: No Tendon: No Muscle: No Joint: No Bone: No Epithelialization: Small (1-33%) N/A N/A Periwound Skin Texture: Excoriation: Yes N/A N/A Induration: Yes Callus: No Crepitus: No Rash: No Scarring: No Periwound Skin Maceration: No N/A N/A Moisture: Dry/Scaly: No Periwound Skin Color: Ecchymosis: Yes N/A N/A Atrophie Blanche: No Cyanosis: No Erythema: No Hemosiderin Staining: No Mottled: No Pallor: No Rubor: No Temperature: No Abnormality N/A N/A Tenderness on Yes N/A N/A Palpation: Wound Preparation: Ulcer Cleansing: Other: N/A N/A soap and water Topical Anesthetic Applied: Other: lidociane 4% Treatment Notes Electronic Signature(s) Signed: 04/28/2017 5:17:17 PM By: Benjamin Soto Entered By: Benjamin Cool, BSN, RN, CWS, Kim on 04/28/2017 09:34:53 Benjamin Soto ([id number]) -------------------------------------------------------------------------------- Multi-Disciplinary Care Plan Details Patient Name: Benjamin Soto Date of Service: 04/28/2017 9:15 AM Medical Record Number: [id number] Patient Account Number: [id number] Date of Birth/Sex: April 28, 1936 (81 y.o. Male) Treating RN: Benjamin Soto Primary Care Amamda Curbow: Benjamin Soto Other Clinician: Referring Ayren Zumbro: Benjamin Soto Treating Lakisha Peyser/Extender: Benjamin Soto Weeks in Treatment: 11 Active Inactive ` Abuse / Safety / Falls / Self Care Management Nursing Diagnoses: Potential for falls Goals: Patient will not experience any injury related to falls Date Initiated: 02/09/2017 Target Resolution Date: 04/23/2017 Goal Status: Active Interventions: Assess fall risk on admission and as needed Notes: ` Orientation to the Wound Care Program Nursing Diagnoses: Knowledge deficit related to the wound healing center program Goals: Patient/caregiver will verbalize understanding of the [facility] Date Initiated: 02/09/2017 Target Resolution Date:  04/23/2017 Goal Status: Active Interventions: Provide education on orientation to the wound center Notes: ` Wound/Skin Impairment Nursing Diagnoses: Impaired tissue integrity Benjamin Soto, Benjamin Soto ([id number]) Goals: Patient/caregiver will verbalize understanding of skin care regimen Date Initiated: 02/09/2017 Target Resolution Date: 04/23/2017 Goal Status: Active Ulcer/skin breakdown will have a volume reduction of 30% by week 4 Date Initiated: 02/09/2017 Target Resolution Date: 04/23/2017 Goal Status: Active Ulcer/skin breakdown will have a volume reduction of 50% by week 8 Date Initiated: 02/09/2017 Target Resolution Date: 04/23/2017 Goal Status: Active Ulcer/skin breakdown will have a volume reduction of 80% by week 12 Date Initiated: 02/09/2017 Target Resolution Date: 04/23/2017 Goal Status: Active Ulcer/skin breakdown will heal within 14 weeks Date Initiated: 02/09/2017 Target Resolution Date: 04/23/2017 Goal Status: Active Interventions: Assess patient/caregiver ability to obtain necessary supplies Assess patient/caregiver ability to perform  ulcer/skin care regimen upon admission and as needed Assess ulceration(s) every visit Notes: Electronic Signature(s) Signed: 04/28/2017 5:17:17 PM By: Benjamin Soto Entered By: Benjamin Cool, BSN, RN, CWS, Kim on 04/28/2017 09:34:43 Benjamin Soto ([id number]) -------------------------------------------------------------------------------- Pain Assessment Details Patient Name: Benjamin Soto Date of Service: 04/28/2017 9:15 AM Medical Record Number: [id number] Patient Account Number: [id number] Date of Birth/Sex: [birth year] (81 y.o. Male) Treating RN: Benjamin Soto Primary Care Ericson Nafziger: Benjamin Soto Other Clinician: Referring Kelleen Stolze: Benjamin Soto Treating Rene Gonsoulin/Extender: Benjamin Soto Weeks in Treatment: 11 Active Problems Location of Pain Severity and Description of Pain Patient Has Paino No Site Locations With Dressing  Change: No Pain Management and Medication Current Pain Management: Goals for Pain Management Topical or injectable lidocaine is offered to patient for acute pain when surgical debridement is performed. If needed, Patient is instructed to use over the counter pain medication for the following 24-48 hours after debridement. Wound care MDs do not prescribed pain medications. Patient has chronic pain or uncontrolled pain. Patient has been instructed to make an appointment with their Primary Care Physician for pain management. Electronic Signature(s) Signed: 04/28/2017 5:17:17 PM By: Benjamin Soto Entered By: Benjamin Cool, BSN, RN, CWS, Kim on 04/28/2017 09:27:02 Benjamin Soto ([id number]) -------------------------------------------------------------------------------- Patient/Caregiver Education Details Patient Name: Benjamin Soto Date of Service: 04/28/2017 9:15 AM Medical Record Number: [id number] Patient Account Number: [id number] Date of Birth/Gender: 01/23/1936 (81 y.o. Male) Treating RN: Benjamin Soto Primary Care Physician: Benjamin Soto Other Clinician: Referring Physician: Elsie Soto Treating Physician/Extender: Sharalyn Ink in Treatment: 11 Education Assessment Education Provided To: Patient Education Topics Provided Welcome To The [facility]: Wound/Skin Impairment: Handouts: Caring for Your Ulcer Methods: Demonstration Responses: State content correctly Electronic Signature(s) Signed: 04/28/2017 5:17:17 PM By: Benjamin Soto Entered By: Benjamin Cool, BSN, RN, CWS, Kim on 04/28/2017 09:50:15 Benjamin Soto ([id number]) -------------------------------------------------------------------------------- Wound Assessment Details Patient Name: Benjamin Soto Date of Service: 04/28/2017 9:15 AM Medical Record Number: [id number] Patient Account Number: [id number] Date of Birth/Sex: [birth year] (81 y.o. Male) Treating RN: Benjamin Soto Primary  Care Laisa Larrick: Benjamin Soto Other Clinician: Referring Lailanie Hasley: Benjamin Soto Treating Niguel Moure/Extender: Benjamin Soto Weeks in Treatment: 11 Wound Status Wound Number: 1 Primary Trauma, Other Etiology: Wound Location: Left Lower Leg - Midline Wound Open Wounding Event: Trauma Status: Date Acquired: 01/23/2017 Comorbid Glaucoma, Chronic Obstructive Weeks Of Treatment: 11 History: Pulmonary Disease (COPD), Arrhythmia, Clustered Wound: No Coronary Artery Disease, Hypertension, Type II Diabetes, Osteoarthritis, Neuropathy Photos Wound Measurements Length: (cm) 3.5 Width: (cm) 1.5 Depth: (cm) 0.1 Area: (cm) 4.123 Volume: (cm) 0.412 % Reduction in Area: 18% % Reduction in Volume: 18.1% Epithelialization: Small (1-33%) Tunneling: No Undermining: No Wound Description Full Thickness Without Exposed Foul Odor Af Classification: Support Structures Slough/Fibri Diabetic Severity Grade 1 (Wagner): Wound Margin: Flat and Intact Exudate Amount: Medium Exudate Type: Serosanguineous Exudate Color: red, brown ter Cleansing: No no Yes Wound Bed Granulation Amount: Large (67-100%) Exposed Structure Granulation Quality: Red, Hyper-granulation Fascia Exposed: No Benjamin Soto, Benjamin Soto ([id number]) Necrotic Amount: Small (1-33%) Fat Layer (Subcutaneous Tissue) Exposed: Yes Necrotic Quality: Adherent Slough Tendon Exposed: No Muscle Exposed: No Joint Exposed: No Bone Exposed: No Periwound Skin Texture Texture Color No Abnormalities Noted: No No Abnormalities Noted: No Callus: No Atrophie Blanche: No Crepitus: No Cyanosis: No Excoriation: Yes Ecchymosis: Yes Induration: Yes Erythema: No Rash: No Hemosiderin Staining: No Scarring: No Mottled: No Pallor: No Moisture Rubor: No No Abnormalities Noted: No Dry / Scaly: No Temperature /  Pain Maceration: No Temperature: No Abnormality Tenderness on Palpation: Yes Wound Preparation Ulcer Cleansing: Other: soap and  water, Topical Anesthetic Applied: Other: lidociane 4%, Treatment Notes Wound #1 (Left, Midline Lower Leg) 1. Cleansed with: Clean wound with Normal Saline 2. Anesthetic Topical Lidocaine 4% cream to wound bed prior to debridement 4. Dressing Applied: Other dressing (specify in notes) 5. Secondary Dressing Applied ABD and Kerlix/Conform 7. Secured with Tape Notes Environmental education officer) Signed: 04/28/2017 5:17:17 PM By: Benjamin Soto Entered By: Benjamin Cool, BSN, RN, CWS, Kim on 04/28/2017 09:32:18 Benjamin Soto ([id number]) -------------------------------------------------------------------------------- Elliott Details Patient Name: Benjamin Soto Date of Service: 04/28/2017 9:15 AM Medical Record Number: [id number] Patient Account Number: [id number] Date of Birth/Sex: 1936/07/23 (81 y.o. Male) Treating RN: Benjamin Soto Primary Care Prudy Candy: Benjamin Soto Other Clinician: Referring Ronie Fleeger: Benjamin Soto Treating Rakeisha Nyce/Extender: Benjamin Soto Weeks in Treatment: 11 Vital Signs Time Taken: 09:27 Temperature (F): 97.6 Height (in): 69 Pulse (bpm): 72 Weight (lbs): 185 Respiratory Rate (breaths/min): 16 Body Mass Index (BMI): 27.3 Blood Pressure (mmHg): 106/63 Reference Range: 80 - 120 mg / dl Electronic Signature(s) Signed: 04/28/2017 5:17:17 PM By: Benjamin Soto Entered By: Benjamin Cool, BSN, RN, CWS, Kim on 04/28/2017 [phone]

## 2017-05-05 ENCOUNTER — Encounter: Payer: Medicare Other | Admitting: Physician Assistant

## 2017-05-05 DIAGNOSIS — Z7901 Long term (current) use of anticoagulants: Secondary | ICD-10-CM | POA: Diagnosis not present

## 2017-05-05 DIAGNOSIS — E1122 Type 2 diabetes mellitus with diabetic chronic kidney disease: Secondary | ICD-10-CM | POA: Diagnosis not present

## 2017-05-05 DIAGNOSIS — I130 Hypertensive heart and chronic kidney disease with heart failure and stage 1 through stage 4 chronic kidney disease, or unspecified chronic kidney disease: Secondary | ICD-10-CM | POA: Diagnosis not present

## 2017-05-05 DIAGNOSIS — S81802A Unspecified open wound, left lower leg, initial encounter: Secondary | ICD-10-CM | POA: Diagnosis not present

## 2017-05-05 DIAGNOSIS — L97223 Non-pressure chronic ulcer of left calf with necrosis of muscle: Secondary | ICD-10-CM | POA: Diagnosis not present

## 2017-05-05 DIAGNOSIS — E11622 Type 2 diabetes mellitus with other skin ulcer: Secondary | ICD-10-CM | POA: Diagnosis not present

## 2017-05-05 DIAGNOSIS — I110 Hypertensive heart disease with heart failure: Secondary | ICD-10-CM | POA: Diagnosis not present

## 2017-05-05 DIAGNOSIS — I2510 Atherosclerotic heart disease of native coronary artery without angina pectoris: Secondary | ICD-10-CM | POA: Diagnosis not present

## 2017-05-05 DIAGNOSIS — I4891 Unspecified atrial fibrillation: Secondary | ICD-10-CM | POA: Diagnosis not present

## 2017-05-05 DIAGNOSIS — E1140 Type 2 diabetes mellitus with diabetic neuropathy, unspecified: Secondary | ICD-10-CM | POA: Diagnosis not present

## 2017-05-05 DIAGNOSIS — L97823 Non-pressure chronic ulcer of other part of left lower leg with necrosis of muscle: Secondary | ICD-10-CM | POA: Diagnosis not present

## 2017-05-05 DIAGNOSIS — S81812A Laceration without foreign body, left lower leg, initial encounter: Secondary | ICD-10-CM | POA: Diagnosis not present

## 2017-05-05 DIAGNOSIS — L03116 Cellulitis of left lower limb: Secondary | ICD-10-CM | POA: Diagnosis not present

## 2017-05-05 DIAGNOSIS — I509 Heart failure, unspecified: Secondary | ICD-10-CM | POA: Diagnosis not present

## 2017-05-05 DIAGNOSIS — N182 Chronic kidney disease, stage 2 (mild): Secondary | ICD-10-CM | POA: Diagnosis not present

## 2017-05-05 DIAGNOSIS — J449 Chronic obstructive pulmonary disease, unspecified: Secondary | ICD-10-CM | POA: Diagnosis not present

## 2017-05-07 NOTE — Progress Notes (Signed)
KIPPER, BUCH ([id number]) Visit Report for 05/05/2017 Arrival Information Details Patient Name: NOLEN, LINDAMOOD Date of Service: 05/05/2017 9:45 AM Medical Record Number: [id number] Patient Account Number: [id number] Date of Birth/Sex: April 29, 1936 (81 y.o. Male) Treating RN: Baruch Gouty, RN, BSN, Velva Harman Primary Care Bence Trapp: Elsie Stain Other Clinician: Referring Stanton Kissoon: Elsie Stain Treating Leonarda Leis/Extender: Melburn Hake, HOYT Weeks in Treatment: 12 Visit Information History Since Last Visit All ordered tests and consults were completed: No Patient Arrived: Ambulatory Added or deleted any medications: No Arrival Time: 09:59 Any new allergies or adverse reactions: No Accompanied By: wife Had a fall or experienced change in No Transfer Assistance: None activities of daily living that may affect Patient Identification Verified: Yes risk of falls: Secondary Verification Process Yes Signs or symptoms of abuse/neglect since last No Completed: visito Patient Requires Transmission- No Hospitalized since last visit: No Based Precautions: Has Dressing in Place as Prescribed: Yes Patient Has Alerts: Yes Pain Present Now: Yes Patient Alerts: Patient on Blood Thinner warfarin Type II Diabetic Electronic Signature(s) Signed: 05/05/2017 12:56:07 PM By: Regan Lemming BSN, RN Entered By: Regan Lemming on 05/05/2017 10:01:46 [name] ([id number]) -------------------------------------------------------------------------------- Clinic Level of Care Assessment Details Patient Name: [name] Date of Service: 05/05/2017 9:45 AM Medical Record Number: [id number] Patient Account Number: [id number] Date of Birth/Sex: 09-08-1936 (81 y.o. Male) Treating RN: Baruch Gouty, RN, BSN, [place] Primary Care Tajah Schreiner: Elsie Stain Other Clinician: Referring Kayia Billinger: Elsie Stain Treating Sherwin Hollingshed/Extender: Melburn Hake, HOYT Weeks in Treatment: 12 Clinic Level of Care Assessment Items TOOL 4 Quantity  Score []  - Use when only an EandM is performed on FOLLOW-UP visit 0 ASSESSMENTS - Nursing Assessment / Reassessment X - Reassessment of Co-morbidities (includes updates in patient status) 1 10 X - Reassessment of Adherence to Treatment Plan 1 5 ASSESSMENTS - Wound and Skin Assessment / Reassessment X - Simple Wound Assessment / Reassessment - one wound 1 5 []  - Complex Wound Assessment / Reassessment - multiple wounds 0 []  - Dermatologic / Skin Assessment (not related to wound area) 0 ASSESSMENTS - Focused Assessment []  - Circumferential Edema Measurements - multi extremities 0 []  - Nutritional Assessment / Counseling / Intervention 0 X - Lower Extremity Assessment (monofilament, tuning fork, pulses) 1 5 []  - Peripheral Arterial Disease Assessment (using hand held doppler) 0 ASSESSMENTS - Ostomy and/or Continence Assessment and Care []  - Incontinence Assessment and Management 0 []  - Ostomy Care Assessment and Management (repouching, etc.) 0 PROCESS - Coordination of Care X - Simple Patient / Family Education for ongoing care 1 15 []  - Complex (extensive) Patient / Family Education for ongoing care 0 []  - Staff obtains Programmer, systems, Records, Test Results / Process Orders 0 []  - Staff telephones HHA, Nursing Homes / Clarify orders / etc 0 []  - Routine Transfer to another Facility (non-emergent condition) 0 KAZUMA, ELENA ([id number]) []  - Routine Hospital Admission (non-emergent condition) 0 []  - New Admissions / Biomedical engineer / Ordering NPWT, Apligraf, etc. 0 []  - Emergency Hospital Admission (emergent condition) 0 []  - Simple Discharge Coordination 0 []  - Complex (extensive) Discharge Coordination 0 PROCESS - Special Needs []  - Pediatric / Minor Patient Management 0 []  - Isolation Patient Management 0 []  - Hearing / Language / Visual special needs 0 []  - Assessment of Community assistance (transportation, D/C planning, etc.) 0 []  - Additional assistance / Altered mentation  0 []  - Support Surface(s) Assessment (bed, cushion, seat, etc.) 0 INTERVENTIONS - Wound Cleansing / Measurement X - Simple Wound Cleansing - one wound 1 5 []  -  Complex Wound Cleansing - multiple wounds 0 X - Wound Imaging (photographs - any number of wounds) 1 5 []  - Wound Tracing (instead of photographs) 0 X - Simple Wound Measurement - one wound 1 5 []  - Complex Wound Measurement - multiple wounds 0 INTERVENTIONS - Wound Dressings X - Small Wound Dressing one or multiple wounds 1 10 []  - Medium Wound Dressing one or multiple wounds 0 []  - Large Wound Dressing one or multiple wounds 0 []  - Application of Medications - topical 0 []  - Application of Medications - injection 0 INTERVENTIONS - Miscellaneous []  - External ear exam 0 Kupper, Norah ([id number]) []  - Specimen Collection (cultures, biopsies, blood, body fluids, etc.) 0 []  - Specimen(s) / Culture(s) sent or taken to Lab for analysis 0 []  - Patient Transfer (multiple staff / Harrel Lemon Lift / Similar devices) 0 []  - Simple Staple / Suture removal (25 or less) 0 []  - Complex Staple / Suture removal (26 or more) 0 []  - Hypo / Hyperglycemic Management (close monitor of Blood Glucose) 0 []  - Ankle / Brachial Index (ABI) - do not check if billed separately 0 X - Vital Signs 1 5 Has the patient been seen at the hospital within the last three years: Yes Total Score: 70 Level Of Care: New/Established - Level 2 Electronic Signature(s) Signed: 05/05/2017 12:56:07 PM By: Regan Lemming BSN, RN Entered By: Regan Lemming on 05/05/2017 10:30:52 [name] ([id number]) -------------------------------------------------------------------------------- Encounter Discharge Information Details Patient Name: [name] Date of Service: 05/05/2017 9:45 AM Medical Record Number: [id number] Patient Account Number: [id number] Date of Birth/Sex: 1936/08/16 (81 y.o. Male) Treating RN: Baruch Gouty, RN, BSN, [place] Primary Care Leyla Soliz: Elsie Stain Other  Clinician: Referring Monserrat Vidaurri: Elsie Stain Treating Francie Keeling/Extender: Melburn Hake, HOYT Weeks in Treatment: 12 Encounter Discharge Information Items Discharge Pain Level: 0 Discharge Condition: Stable Ambulatory Status: Ambulatory Discharge Destination: Home Transportation: Private Auto Accompanied By: wife Schedule Follow-up Appointment: No Medication Reconciliation completed and provided to Patient/Care No Rowan Pollman: Provided on Clinical Summary of Care: 05/05/2017 Form Type Recipient Paper Patient HD Electronic Signature(s) Signed: 05/05/2017 12:56:07 PM By: Regan Lemming BSN, RN Entered By: Regan Lemming on 05/05/2017 10:38:02 [name] ([id number]) -------------------------------------------------------------------------------- Lower Extremity Assessment Details Patient Name: [name] Date of Service: 05/05/2017 9:45 AM Medical Record Number: [id number] Patient Account Number: [id number] Date of Birth/Sex: August 21, 1936 (81 y.o. Male) Treating RN: Baruch Gouty, RN, BSN, [place] Primary Care Kahliyah Dick: Elsie Stain Other Clinician: Referring Lourdes Manning: Elsie Stain Treating British Moyd/Extender: Melburn Hake, HOYT Weeks in Treatment: 12 Edema Assessment Assessed: [Left: No] [Right: No] Edema: [Left: N] [Right: o] Calf Left: Right: Point of Measurement: 36 cm From Medial Instep cm cm Ankle Left: Right: Point of Measurement: 10 cm From Medial Instep cm cm Vascular Assessment Claudication: Claudication Assessment [Left:None] Pulses: Dorsalis Pedis Palpable: [Left:Yes] Posterior Tibial Extremity colors, hair growth, and conditions: Extremity Color: [Left:Normal] Hair Growth on Extremity: [Left:Yes] Temperature of Extremity: [Left:Warm] Capillary Refill: [Left:< 3 seconds] Toe Nail Assessment Left: Right: Thick: No Discolored: No Deformed: No Improper Length and Hygiene: No Electronic Signature(s) Signed: 05/05/2017 12:56:07 PM By: Regan Lemming BSN, RN Entered By:  Regan Lemming on 05/05/2017 10:29:32 FLEETWOOD, PIERRON ([name] ([id number]) -------------------------------------------------------------------------------- Multi Wound Chart Details Patient Name: [name] Date of Service: 05/05/2017 9:45 AM Medical Record Number: [id number] Patient Account Number: [id number] Date of Birth/Sex: 08/01/1936 (81 y.o. Male) Treating RN: Baruch Gouty, RN, BSN, [place] Primary Care Calianna Kim: Elsie Stain Other Clinician: Referring Santos Sollenberger: Elsie Stain Treating Kenyette Gundy/Extender: STONE III, HOYT Weeks in Treatment: 12  Vital Signs Height(in): 69 Pulse(bpm): 69 Weight(lbs): 185 Blood Pressure 103/68 (mmHg): Body Mass Index(BMI): 27 Temperature(F): 97.6 Respiratory Rate 16 (breaths/min): Photos: [1:No Photos] [N/A:N/A] Wound Location: [1:Left Lower Leg - Midline] [N/A:N/A] Wounding Event: [1:Trauma] [N/A:N/A] Primary Etiology: [1:Trauma, Other] [N/A:N/A] Comorbid History: [1:Glaucoma, Chronic Obstructive Pulmonary Disease (COPD), Arrhythmia, Coronary Artery Disease, Hypertension, Type II Diabetes, Osteoarthritis, Neuropathy] [N/A:N/A] Date Acquired: [1:01/23/2017] [N/A:N/A] Weeks of Treatment: [1:12] [N/A:N/A] Wound Status: [1:Open] [N/A:N/A] Measurements L x W x D 4.3x1.3x0.1 [N/A:N/A] (cm) Area (cm) : [1:4.39] [N/A:N/A] Volume (cm) : [1:0.439] [N/A:N/A] % Reduction in Area: [1:12.70%] [N/A:N/A] % Reduction in Volume: 12.70% [N/A:N/A] Classification: [1:Full Thickness Without Exposed Support Structures] [N/A:N/A] HBO Classification: [1:Grade 1] [N/A:N/A] Exudate Amount: [1:Medium] [N/A:N/A] Exudate Type: [1:Serosanguineous] [N/A:N/A] Exudate Color: [1:red, brown] [N/A:N/A] Wound Margin: [1:Flat and Intact] [N/A:N/A] Granulation Amount: [1:Large (67-100%)] [N/A:N/A] Granulation Quality: Red, Hyper-granulation N/A N/A Necrotic Amount: None Present (0%) N/A N/A Exposed Structures: Fat Layer (Subcutaneous N/A N/A Tissue)  Exposed: Yes Fascia: No Tendon: No Muscle: No Joint: No Bone: No Epithelialization: Small (1-33%) N/A N/A Periwound Skin Texture: Excoriation: Yes N/A N/A Induration: Yes Callus: No Crepitus: No Rash: No Scarring: No Periwound Skin Maceration: No N/A N/A Moisture: Dry/Scaly: No Periwound Skin Color: Ecchymosis: Yes N/A N/A Atrophie Blanche: No Cyanosis: No Erythema: No Hemosiderin Staining: No Mottled: No Pallor: No Rubor: No Temperature: No Abnormality N/A N/A Tenderness on Yes N/A N/A Palpation: Wound Preparation: Ulcer Cleansing: Other: N/A N/A soap and water Topical Anesthetic Applied: Other: lidociane 4% Treatment Notes Electronic Signature(s) Signed: 05/05/2017 12:56:07 PM By: Regan Lemming BSN, RN Entered By: Regan Lemming on 05/05/2017 10:29:58 [name] ([id number]) -------------------------------------------------------------------------------- Center Details Patient Name: [name] Date of Service: 05/05/2017 9:45 AM Medical Record Number: [id number] Patient Account Number: [id number] Date of Birth/Sex: 1936/02/22 (81 y.o. Male) Treating RN: Baruch Gouty, RN, BSN, Velva Harman Primary Care Cheryll Keisler: Elsie Stain Other Clinician: Referring Kahmya Pinkham: Elsie Stain Treating Caileb Rhue/Extender: Melburn Hake, HOYT Weeks in Treatment: 12 Active Inactive ` Abuse / Safety / Falls / Self Care Management Nursing Diagnoses: Potential for falls Goals: Patient will not experience any injury related to falls Date Initiated: 02/09/2017 Target Resolution Date: 04/23/2017 Goal Status: Active Interventions: Assess fall risk on admission and as needed Notes: ` Orientation to the Wound Care Program Nursing Diagnoses: Knowledge deficit related to the wound healing center program Goals: Patient/caregiver will verbalize understanding of the [facility] Program Date Initiated: 02/09/2017 Target Resolution Date: 04/23/2017 Goal Status:  Active Interventions: Provide education on orientation to the wound center Notes: ` Wound/Skin Impairment Nursing Diagnoses: Impaired tissue integrity DAVIT, VASSAR ([id number]) Goals: Patient/caregiver will verbalize understanding of skin care regimen Date Initiated: 02/09/2017 Target Resolution Date: 04/23/2017 Goal Status: Active Ulcer/skin breakdown will have a volume reduction of 30% by week 4 Date Initiated: 02/09/2017 Target Resolution Date: 04/23/2017 Goal Status: Active Ulcer/skin breakdown will have a volume reduction of 50% by week 8 Date Initiated: 02/09/2017 Target Resolution Date: 04/23/2017 Goal Status: Active Ulcer/skin breakdown will have a volume reduction of 80% by week 12 Date Initiated: 02/09/2017 Target Resolution Date: 04/23/2017 Goal Status: Active Ulcer/skin breakdown will heal within 14 weeks Date Initiated: 02/09/2017 Target Resolution Date: 04/23/2017 Goal Status: Active Interventions: Assess patient/caregiver ability to obtain necessary supplies Assess patient/caregiver ability to perform ulcer/skin care regimen upon admission and as needed Assess ulceration(s) every visit Notes: Electronic Signature(s) Signed: 05/05/2017 12:56:07 PM By: Regan Lemming BSN, RN Entered By: Regan Lemming on 05/05/2017 10:29:45 [name] ([id number]) -------------------------------------------------------------------------------- Pain Assessment Details Patient Name:  [name] Date of Service: 05/05/2017 9:45 AM Medical Record Number: [id number] Patient Account Number: [id number] Date of Birth/Sex: [birth year] (81 y.o. Male) Treating RN: Baruch Gouty, RN, BSN, Velva Harman Primary Care Zeriah Baysinger: Elsie Stain Other Clinician: Referring Braelynne Garinger: Elsie Stain Treating Thadeus Gandolfi/Extender: Melburn Hake, HOYT Weeks in Treatment: 12 Active Problems Location of Pain Severity and Description of Pain Patient Has Paino Yes Site Locations Pain Location: Pain in Ulcers With Dressing Change:  Yes Pain Management and Medication Current Pain Management: Electronic Signature(s) Signed: 05/05/2017 12:56:07 PM By: Regan Lemming BSN, RN Entered By: Regan Lemming on 05/05/2017 10:02:01 [name] ([id number]) -------------------------------------------------------------------------------- Patient/Caregiver Education Details Patient Name: [name] Date of Service: 05/05/2017 9:45 AM Medical Record Number: [id number] Patient Account Number: [id number] Date of Birth/Gender: [birth year] (81 y.o. Male) Treating RN: Baruch Gouty, RN, BSN, Velva Harman Primary Care Physician: Elsie Stain Other Clinician: Referring Physician: Elsie Stain Treating Physician/Extender: Sharalyn Ink in Treatment: 12 Education Assessment Education Provided To: Patient Education Topics Provided Basic Hygiene: Methods: Explain/Verbal Responses: Reinforcements needed, State content correctly Welcome To [facility]: Methods: Explain/Verbal Responses: Reinforcements needed, State content correctly Wound/Skin Impairment: Methods: Explain/Verbal Responses: Reinforcements needed, State content correctly Electronic Signature(s) Signed: 05/05/2017 12:56:07 PM By: Regan Lemming BSN, RN Entered By: Regan Lemming on 05/05/2017 10:38:33 [name] ([id number]) -------------------------------------------------------------------------------- Wound Assessment Details Patient Name: [name] Date of Service: 05/05/2017 9:45 AM Medical Record Number: [id number] Patient Account Number: [id number] Date of Birth/Sex: 30-Mar-1936 (81 y.o. Male) Treating RN: Baruch Gouty, RN, BSN, [place] Primary Care Naftuli Dalsanto: Elsie Stain Other Clinician: Referring Sherrise Liberto: Elsie Stain Treating Margalit Leece/Extender: Melburn Hake, HOYT Weeks in Treatment: 12 Wound Status Wound Number: 1 Primary Trauma, Other Etiology: Wound Location: Left Lower Leg - Midline Wound Open Wounding Event: Trauma Status: Date Acquired:  01/23/2017 Comorbid Glaucoma, Chronic Obstructive Weeks Of Treatment: 12 History: Pulmonary Disease (COPD), Arrhythmia, Clustered Wound: No Coronary Artery Disease, Hypertension, Type II Diabetes, Osteoarthritis, Neuropathy Photos Photo Uploaded By: Gretta Cool, BSN, RN, CWS, Kim on 05/05/2017 13:57:09 Wound Measurements Length: (cm) 4.3 Width: (cm) 1.3 Depth: (cm) 0.1 Area: (cm) 4.39 Volume: (cm) 0.439 % Reduction in Area: 12.7% % Reduction in Volume: 12.7% Epithelialization: Small (1-33%) Tunneling: No Undermining: No Wound Description Full Thickness Without Exposed Foul Odor Af Classification: Support Structures Slough/Fibri Diabetic Severity Grade 1 (Wagner): Wound Margin: Flat and Intact Exudate Amount: Medium Exudate Type: Serosanguineous Exudate Color: red, brown ROLEN, CONGER ([id number]) ter Cleansing: No no No Wound Bed Granulation Amount: Large (67-100%) Exposed Structure Granulation Quality: Red, Hyper-granulation Fascia Exposed: No Necrotic Amount: None Present (0%) Fat Layer (Subcutaneous Tissue) Exposed: Yes Tendon Exposed: No Muscle Exposed: No Joint Exposed: No Bone Exposed: No Periwound Skin Texture Texture Color No Abnormalities Noted: No No Abnormalities Noted: No Callus: No Atrophie Blanche: No Crepitus: No Cyanosis: No Excoriation: Yes Ecchymosis: Yes Induration: Yes Erythema: No Rash: No Hemosiderin Staining: No Scarring: No Mottled: No Pallor: No Moisture Rubor: No No Abnormalities Noted: No Dry / Scaly: No Temperature / Pain Maceration: No Temperature: No Abnormality Tenderness on Palpation: Yes Wound Preparation Ulcer Cleansing: Other: soap and water, Topical Anesthetic Applied: Other: lidociane 4%, Treatment Notes Wound #1 (Left, Midline Lower Leg) 1. Cleansed with: Clean wound with Normal Saline 4. Dressing Applied: Other dressing (specify in notes) 5. Secondary Dressing Applied Bordered Foam Dressing Dry  Gauze Notes Endoform Electronic Signature(s) Signed: 05/05/2017 12:56:07 PM By: Regan Lemming BSN, RN Entered By: Regan Lemming on 05/05/2017 10:06:00 [name] ([id number]) -------------------------------------------------------------------------------- Vitals Details Patient Name: [name] Date of Service: 05/05/2017 9:45  AM Medical Record Number: [id number] Patient Account Number: [id number] Date of Birth/Sex: 1935/10/17 (81 y.o. Male) Treating RN: Afful, RN, BSN, [place] Primary Care Mikea Quadros: Elsie Stain Other Clinician: Referring Nymir Ringler: Elsie Stain Treating Nylah Butkus/Extender: Melburn Hake, HOYT Weeks in Treatment: 12 Vital Signs Time Taken: 10:02 Temperature (F): 97.6 Height (in): 69 Pulse (bpm): 69 Weight (lbs): 185 Respiratory Rate (breaths/min): 16 Body Mass Index (BMI): 27.3 Blood Pressure (mmHg): 103/68 Reference Range: 80 - 120 mg / dl Electronic Signature(s) Signed: 05/05/2017 12:56:07 PM By: Regan Lemming BSN, RN Entered By: Regan Lemming on 05/05/2017 10:02:29

## 2017-05-07 NOTE — Progress Notes (Signed)
[name], [name] ([id number]) Visit Report for 05/05/2017 Chief Complaint Document Details Patient Name: Benjamin Soto, Benjamin Soto Date of Service: 05/05/2017 9:45 AM Medical Record Number: [id number] Patient Account Number: [id number] Date of Birth/Sex: April 06, 1936 (81 y.o. Male) Treating RN: Baruch Gouty, RN, BSN, Velva Harman Primary Care Provider: Elsie Stain Other Clinician: Referring Provider: Elsie Stain Treating Provider/Extender: Melburn Hake, Avri Paiva Weeks in Treatment: 12 Information Obtained from: Patient Chief Complaint 02/09/17; Patient is here for review of a traumatic wound on the left leg anterior Electronic Signature(s) Signed: 05/06/2017 12:37:32 AM By: Worthy Keeler PA-C Entered By: Worthy Keeler on 05/05/2017 10:33:41 Benjamin Soto ([id number]) -------------------------------------------------------------------------------- HPI Details Patient Name: Benjamin Soto Date of Service: 05/05/2017 9:45 AM Medical Record Number: [id number] Patient Account Number: [id number] Date of Birth/Sex: [birth year] (81 y.o. Male) Treating RN: Baruch Gouty, RN, BSN, Velva Harman Primary Care Provider: Elsie Stain Other Clinician: Referring Provider: Elsie Stain Treating Provider/Extender: Melburn Hake, Skyler Carel Weeks in Treatment: 12 History of Present Illness HPI Description: 02/09/17; 81 year old man with a hx of type 2 DM, smoker. He suffered a fall 17 days ago while working on the [employer] of his trailer. He didn't realized anything had happed until he returned home. Has been applying polysporin. Received Cephalexin finishes on Saturday. No prior wound history. ABI's 0.96 on the left. No known hx of PAD. Hx of Afib on Coumadin,chf,st 2 crf 02/16/17; patient arrives today with the condition of his wound improved he is on silver alginate. Culture of the divot medially last week grew 2 different types of enterococcus including Enterococcus faecalis. He is on Augmentin. Surface of the wound looks better. Still debridement  required today 02/23/17; traumatic wound on the left anterior leg. He has finished Augmentin. He found debridement last week painful fortunately none was required today 03/02/17; traumatic wound on the left anterior leg. Still 75% necrotic surface material requiring debridement. Most of the rest that it is stable 03/09/17; initially traumatic wound on his left leg. Arrives today with complaints of more pain and more erythema around the wound. States the pain is severe enough that it keeps him awake at night. The original wound surface itself looks good and the divot that was laterally has closed over. We switched him to [facility] last week. Wife shows me a lot of drainage coming out of the wound surface 03/16/17; the patient comes in today with a better-looking wound circumference no erythema. He is completed the doxycycline I gave him. He still complains of a lot of pain which he states radiates into the foot and keeps him awake at night. It seems that the original injury happened when he fell backwards and the anterior leg came up against part of a trailer hitch is the best I'm able to determine. He also hurt his lower coccyx. We have been using silver alginate since last week largely because of the infection 03/23/17; no major change in this man's wounds. He has leaking edema fluid. Surface of the wound looks better we have been using silver alginate. Still complaining of a lot of discomfort 04/28/17 on evaluation today patient's left lower extremity wound on the interior shin appears to be doing extremely well. There is slight hyper granulation but overall he has been tolerating the dressing changes without complication. There is no report of fevers, chills, nausea, vomiting, or diarrhea. Patient has minimal discomfort at most a one out of 10 and that's mainly with cleansing and debridement week by week that he knows this. 03/31/17 on evaluation today patient appears to be  doing worse  unfortunately. His wound appears to be infected and is more tender to palpation. Nothing has changed other than he has discontinued his antibiotic at this point. 04/07/17; patient continues to decline with erythema around the wound. Culture last week did not significantly culture and organism. There was no staph aureus no group A strep although the Gram stain showed abundant gram-negative rods. The only culture we did when he initially came in here showed both enterococcus and Enterobacter. I gave him a course of Augmentin and things really seemed to settle down although I don't know that the Enterobacter would've been treated well. He still complains of a lot of pain. He a lot of complaints about the wrap 04/14/17; the patient has a better-looking wound today however he has less edema control now that we are not putting him under compression. X-ray I did last week was negative. Culture and sensitivity negative. No need for further antibiotics IZIC, STFORT ([id number]) 04/21/17; the patient arrives today with a better-looking wound. Healthy granulation and reduction in the width of the wound. He did not do well with Hydrofera Blue we have been using Endo form 05/05/17 on evaluation today patient's left lower some the wound appears to be doing very well. We have been using the Endoform and noting excellent progress. Fortunately patient states that he has not been having any significant pain and is also likewise pleased with how things have progressed. This has done much better than the [facility] Dressing. No fevers, chills, nausea, or vomiting noted at this time. Electronic Signature(s) Signed: 05/06/2017 12:37:32 AM By: Worthy Keeler PA-C Entered By: Worthy Keeler on 05/05/2017 10:35:04 Benjamin Soto ([id number]) -------------------------------------------------------------------------------- Physical Exam Details Patient Name: Benjamin Soto Date of Service: 05/05/2017 9:45  AM Medical Record Number: [id number] Patient Account Number: [id number] Date of Birth/Sex: [birth year] (81 y.o. Male) Treating RN: Baruch Gouty, RN, BSN, Velva Harman Primary Care Provider: Elsie Stain Other Clinician: Referring Provider: Elsie Stain Treating Provider/Extender: Melburn Hake, Briseida Gittings Weeks in Treatment: 27 Constitutional Well-nourished and well-hydrated in no acute distress. Respiratory normal breathing without difficulty. clear to auscultation bilaterally. Cardiovascular regular rate and rhythm with normal S1, S2. Psychiatric this patient is able to make decisions and demonstrates good insight into disease process. Alert and Oriented x 3. pleasant and cooperative. Notes On evaluation today patient's wound appears to be slightly hyper granular but nonetheless continues to show excellent improvement as far as migration of epithelium over the wound. I'm pleased with the progress that he is making. No surrounding erythema noted in regard to the wound. Electronic Signature(s) Signed: 05/06/2017 12:37:32 AM By: Worthy Keeler PA-C Entered By: Worthy Keeler on 05/05/2017 10:35:32 Benjamin Soto ([id number]) -------------------------------------------------------------------------------- Physician Orders Details Patient Name: Benjamin Soto Date of Service: 05/05/2017 9:45 AM Medical Record Number: [id number] Patient Account Number: [id number] Date of Birth/Sex: 09-29-1935 (81 y.o. 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 o Cleanse wound with mild soap and water Anesthetic Wound #1 Left,Midline Lower Leg o Topical Lidocaine 4% cream applied to wound bed prior to debridement Skin Barriers/Peri-Wound Care Wound #1 Left,Midline Lower  Leg o Barrier cream Primary Wound Dressing Wound #1 Left,Midline Lower Leg o Other: - Endoform Secondary Dressing Wound #1 Left,Midline Lower Leg o ABD and Kerlix/Conform Dressing Change Frequency Wound #1 Left,Midline Lower Leg o Other: - Friday and Monday Follow-up Appointments Wound #1 Left,Midline Lower Leg o Return Appointment in 1 week. ANTHONNY, SCHILLER ([id number]) Edema Control Wound #1 Left,Midline Lower Leg o Elevate legs to the level of the heart and pump ankles as often as possible Additional Orders / Instructions Wound #1 Left,Midline Lower Leg o Increase protein intake. o Other: - Please add vitamin A, vitamin C and zinc supplements to your diet Electronic Signature(s) Signed: 05/05/2017 12:56:07 PM By: Regan Lemming BSN, RN Signed: 05/06/2017 12:37:32 AM By: Worthy Keeler PA-C Entered By: Regan Lemming on 05/05/2017 10:31:32 Benjamin Soto ([id number]) -------------------------------------------------------------------------------- Problem List Details Patient Name: Benjamin Soto Date of Service: 05/05/2017 9:45 AM Medical Record Number: [id number] Patient Account Number: [id number] Date of Birth/Sex: Feb 14, 1936 (81 y.o. Male) Treating RN: Baruch Gouty, RN, BSN, [place]on Primary Care Provider: Elsie Stain Other Clinician: Referring Provider: Elsie Stain Treating Provider/Extender: Melburn Hake, Keyler Hoge Weeks in Treatment: 12 Active Problems ICD-10 Encounter Code Description Active Date Diagnosis S81.812D Laceration without foreign body, left lower leg, subsequent 02/09/2017 Yes encounter L97.223 Non-pressure chronic ulcer of left calf with necrosis of 02/09/2017 Yes muscle L03.116 Cellulitis of left  lower limb 03/09/2017 Yes Inactive Problems Resolved Problems Electronic Signature(s) Signed: 05/06/2017 12:37:32 AM By: Worthy Keeler PA-C Entered By: Worthy Keeler on 05/05/2017 10:27:32 Benjamin Soto ([id number]) -------------------------------------------------------------------------------- Progress Note Details Patient Name: Benjamin Soto Date of Service: 05/05/2017 9:45 AM Medical Record Number: [id number] Patient Account Number: [id number] Date of Birth/Sex: 07-Apr-1936 (81 y.o. Male) Treating RN: Baruch Gouty, RN, BSN, [place] Primary Care Provider: Elsie Stain Other Clinician: Referring Provider: Elsie Stain Treating Provider/Extender: Melburn Hake, Akiva Brassfield Weeks in Treatment: 12 Subjective Chief Complaint Information obtained from Patient 02/09/17; Patient is here for review of a traumatic wound on the left leg anterior History of Present Illness (HPI) 02/09/17; 81 year old man with a hx of type 2 DM, smoker. He suffered a fall 17 days ago while working on the [employer] of his trailer. He didn't realized anything had happed until he returned home. Has been applying polysporin. Received Cephalexin finishes on Saturday. No prior wound history. ABI's 0.96 on the left. No known hx of PAD. Hx of Afib on Coumadin,chf,st 2 crf 02/16/17; patient arrives today with the condition of his wound improved he is on silver alginate. Culture of the divot medially last week grew 2 different types of enterococcus including Enterococcus faecalis. He is on Augmentin. Surface of the wound looks better. Still debridement required today 02/23/17; traumatic wound on the left anterior leg. He has finished Augmentin. He found debridement last week painful fortunately none was required today 03/02/17; traumatic wound on the left anterior leg. Still 75% necrotic surface material requiring debridement. Most of the rest that it is stable 03/09/17; initially traumatic wound on his left leg. Arrives today with complaints  of more pain and more erythema around the wound. States the pain is severe enough that it keeps him awake at night. The original wound surface itself looks good and the divot that was laterally has closed over. We switched him to [facility] last week. Wife shows me a lot of drainage coming out of the wound surface 03/16/17; the patient comes in today with a better-looking wound circumference no erythema. He is completed the doxycycline I gave him. He still complains  of a lot of pain which he states radiates into the foot and keeps him awake at night. It seems that the original injury happened when he fell backwards and the anterior leg came up against part of a trailer hitch is the best I'm able to determine. He also hurt his lower coccyx. We have been using silver alginate since last week largely because of the infection 03/23/17; no major change in this man's wounds. He has leaking edema fluid. Surface of the wound looks better we have been using silver alginate. Still complaining of a lot of discomfort 04/28/17 on evaluation today patient's left lower extremity wound on the interior shin appears to be doing extremely well. There is slight hyper granulation but overall he has been tolerating the dressing changes without complication. There is no report of fevers, chills, nausea, vomiting, or diarrhea. Patient has minimal discomfort at most a one out of 10 and that's mainly with cleansing and debridement week by week that he knows this. 03/31/17 on evaluation today patient appears to be doing worse unfortunately. His wound appears to be infected and is more tender to palpation. Nothing has changed other than he has discontinued his antibiotic at this point. 04/07/17; patient continues to decline with erythema around the wound. Culture last week did not significantly culture and organism. There was no staph aureus no group A strep although the Gram stain showed abundant gram-negative rods. The only  culture we did when he initially came in here showed both Livas, Bernard ([id number]) enterococcus and Enterobacter. I gave him a course of Augmentin and things really seemed to settle down although I don't know that the Enterobacter would've been treated well. He still complains of a lot of pain. He a lot of complaints about the wrap 04/14/17; the patient has a better-looking wound today however he has less edema control now that we are not putting him under compression. X-ray I did last week was negative. Culture and sensitivity negative. No need for further antibiotics 04/21/17; the patient arrives today with a better-looking wound. Healthy granulation and reduction in the width of the wound. He did not do well with Hydrofera Blue we have been using Endo form 05/05/17 on evaluation today patient's left lower some the wound appears to be doing very well. We have been using the Endoform and noting excellent progress. Fortunately patient states that he has not been having any significant pain and is also likewise pleased with how things have progressed. This has done much better than the [facility] Dressing. No fevers, chills, nausea, or vomiting noted at this time. Objective Constitutional Well-nourished and well-hydrated in no acute distress. Vitals Time Taken: 10:02 AM, Height: 69 in, Weight: 185 lbs, BMI: 27.3, Temperature: 97.6 F, Pulse: 69 bpm, Respiratory Rate: 16 breaths/min, Blood Pressure: 103/68 mmHg. Respiratory normal breathing without difficulty. clear to auscultation bilaterally. Cardiovascular regular rate and rhythm with normal S1, S2. Psychiatric this patient is able to make decisions and demonstrates good insight into disease process. Alert and Oriented x 3. pleasant and cooperative. General Notes: On evaluation today patient's wound appears to be slightly hyper granular but nonetheless continues to show excellent improvement as far as migration of epithelium over the  wound. I'm pleased with the progress that he is making. No surrounding erythema noted in regard to the wound. Integumentary (Hair, Skin) Wound #1 status is Open. Original cause of wound was Trauma. The wound is located on the Left,Midline Lower Leg. The wound measures 4.3cm length x 1.3cm width  x 0.1cm depth; 4.39cm^2 area and 0.439cm^3 volume. There is Fat Layer (Subcutaneous Tissue) Exposed exposed. There is no tunneling or undermining noted. There is a medium amount of serosanguineous drainage noted. The wound margin is TAMEL, ABEL ([id number]) flat and intact. There is large (67-100%) red granulation within the wound bed. There is no necrotic tissue within the wound bed. The periwound skin appearance exhibited: Excoriation, Induration, Ecchymosis. The periwound skin appearance did not exhibit: Callus, Crepitus, Rash, Scarring, Dry/Scaly, Maceration, Atrophie Blanche, Cyanosis, Hemosiderin Staining, Mottled, Pallor, Rubor, Erythema. Periwound temperature was noted as No Abnormality. The periwound has tenderness on palpation. 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 Cleanse wound with mild soap and water Anesthetic: Wound #1 Left,Midline Lower Leg: Topical Lidocaine 4% cream applied to wound bed prior to debridement Skin Barriers/Peri-Wound Care: Wound #1 Left,Midline Lower Leg: Barrier cream Primary Wound Dressing: Wound #1 Left,Midline Lower Leg: Other: - Endoform Secondary Dressing: Wound #1 Left,Midline Lower Leg: ABD and Kerlix/Conform Dressing Change Frequency: Wound #1 Left,Midline Lower Leg: Other: - Friday and Monday Follow-up Appointments: Wound #1 Left,Midline Lower Leg: Return Appointment  in 1 week. Edema Control: Wound #1 Left,Midline Lower Leg: Elevate legs to the level of the heart and pump ankles as often as possible Lantry, Duke ([id number]) Additional Orders / Instructions: Wound #1 Left,Midline Lower Leg: Increase protein intake. Other: - Please add vitamin A, vitamin C and zinc supplements to your diet We will continue with the Current wound care measures for the next week. Hopefully patient will continue to show good signs of epithelialization and progress at that time. If anything worsens in the interim he will contact our office for additional recommendations. Please see above for specific wound care orders. Electronic Signature(s) Signed: 05/06/2017 12:37:32 AM By: Worthy Keeler PA-C Entered By: Worthy Keeler on 05/05/2017 10:36:21 Benjamin Soto ([id number]) -------------------------------------------------------------------------------- SuperBill Details Patient Name: Benjamin Soto Date of Service: 05/05/2017 Medical Record Number: [id number] Patient Account Number: [id number] Date of Birth/Sex: 10-17-1935 (81 y.o. 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 L03.116 Cellulitis of left lower limb Facility Procedures CPT4 Code: [id number] Description: 03704 - WOUND CARE VISIT-LEV 2 EST PT Modifier: Quantity: 1 Physician Procedures CPT4 Code Description: [id number] - WC PHYS LEVEL 3 - EST PT ICD-10 Description Diagnosis S81.812D Laceration without foreign body, left lower leg, L97.223 Non-pressure chronic ulcer of left calf with necr L03.116 Cellulitis of left lower limb Modifier: subsequent enco  osis of muscle Quantity: 1 unter Electronic Signature(s) Signed: 05/06/2017 12:37:32 AM By: Worthy Keeler PA-C Entered By: Worthy Keeler on 05/05/2017 10:36:36

## 2017-05-12 ENCOUNTER — Encounter: Payer: Medicare Other | Admitting: Internal Medicine

## 2017-05-12 DIAGNOSIS — E11622 Type 2 diabetes mellitus with other skin ulcer: Secondary | ICD-10-CM | POA: Diagnosis not present

## 2017-05-12 DIAGNOSIS — I130 Hypertensive heart and chronic kidney disease with heart failure and stage 1 through stage 4 chronic kidney disease, or unspecified chronic kidney disease: Secondary | ICD-10-CM | POA: Diagnosis not present

## 2017-05-12 DIAGNOSIS — L97823 Non-pressure chronic ulcer of other part of left lower leg with necrosis of muscle: Secondary | ICD-10-CM | POA: Diagnosis not present

## 2017-05-12 DIAGNOSIS — Z7901 Long term (current) use of anticoagulants: Secondary | ICD-10-CM | POA: Diagnosis not present

## 2017-05-12 DIAGNOSIS — J449 Chronic obstructive pulmonary disease, unspecified: Secondary | ICD-10-CM | POA: Diagnosis not present

## 2017-05-12 DIAGNOSIS — I110 Hypertensive heart disease with heart failure: Secondary | ICD-10-CM | POA: Diagnosis not present

## 2017-05-12 DIAGNOSIS — E1140 Type 2 diabetes mellitus with diabetic neuropathy, unspecified: Secondary | ICD-10-CM | POA: Diagnosis not present

## 2017-05-12 DIAGNOSIS — I2510 Atherosclerotic heart disease of native coronary artery without angina pectoris: Secondary | ICD-10-CM | POA: Diagnosis not present

## 2017-05-12 DIAGNOSIS — N182 Chronic kidney disease, stage 2 (mild): Secondary | ICD-10-CM | POA: Diagnosis not present

## 2017-05-12 DIAGNOSIS — L97223 Non-pressure chronic ulcer of left calf with necrosis of muscle: Secondary | ICD-10-CM | POA: Diagnosis not present

## 2017-05-12 DIAGNOSIS — I4891 Unspecified atrial fibrillation: Secondary | ICD-10-CM | POA: Diagnosis not present

## 2017-05-12 DIAGNOSIS — S81812A Laceration without foreign body, left lower leg, initial encounter: Secondary | ICD-10-CM | POA: Diagnosis not present

## 2017-05-12 DIAGNOSIS — I509 Heart failure, unspecified: Secondary | ICD-10-CM | POA: Diagnosis not present

## 2017-05-12 DIAGNOSIS — L03116 Cellulitis of left lower limb: Secondary | ICD-10-CM | POA: Diagnosis not present

## 2017-05-12 DIAGNOSIS — S81802A Unspecified open wound, left lower leg, initial encounter: Secondary | ICD-10-CM | POA: Diagnosis not present

## 2017-05-12 DIAGNOSIS — E1122 Type 2 diabetes mellitus with diabetic chronic kidney disease: Secondary | ICD-10-CM | POA: Diagnosis not present

## 2017-05-13 NOTE — Progress Notes (Signed)
Benjamin Soto, Benjamin Soto ([id number]) Visit Report for 05/12/2017 HPI Details Patient Name: Benjamin Soto, Benjamin Soto Date of Service: 05/12/2017 9:15 AM Medical Record Number: [id number] Patient Account Number: [id number] Date of Birth/Sex: 1936/09/01 (81 y.o. Male) Treating RN: Benjamin Soto Primary Care Provider: Elsie Soto Other Clinician: Referring Provider: Elsie Soto Treating Provider/Extender: Benjamin Soto in Treatment: 13 History of Present Illness HPI Description: 02/09/17; 81 year old man with a hx of type 2 DM, smoker. He suffered a fall 17 days ago while working on the [employer] of his trailer. He didn't realized anything had happed until he returned home. Has been applying polysporin. Received Cephalexin finishes on Saturday. No prior wound history. ABI's 0.96 on the left. No known hx of PAD. Hx of Afib on Coumadin,chf,st 2 crf 02/16/17; patient arrives today with the condition of his wound improved he is on silver alginate. Culture of the divot medially last week grew 2 different types of enterococcus including Enterococcus faecalis. He is on Augmentin. Surface of the wound looks better. Still debridement required today 02/23/17; traumatic wound on the left anterior leg. He has finished Augmentin. He found debridement last week painful fortunately none was required today 03/02/17; traumatic wound on the left anterior leg. Still 75% necrotic surface material requiring debridement. Most of the rest that it is stable 03/09/17; initially traumatic wound on his left leg. Arrives today with complaints of more pain and more erythema around the wound. States the pain is severe enough that it keeps him awake at night. The original wound surface itself looks good and the divot that was laterally has closed over. We switched him to [facility] last week. Wife shows me a lot of drainage coming out of the wound surface 03/16/17; the patient comes in today with a better-looking wound circumference no  erythema. He is completed the doxycycline I gave him. He still complains of a lot of pain which he states radiates into the foot and keeps him awake at night. It seems that the original injury happened when he fell backwards and the anterior leg came up against part of a trailer hitch is the best I'm able to determine. He also hurt his lower coccyx. We have been using silver alginate since last week largely because of the infection 03/23/17; no major change in this man's wounds. He has leaking edema fluid. Surface of the wound looks better we have been using silver alginate. Still complaining of a lot of discomfort 04/28/17 on evaluation today patient's left lower extremity wound on the interior shin appears to be doing extremely well. There is slight hyper granulation but overall he has been tolerating the dressing changes without complication. There is no report of fevers, chills, nausea, vomiting, or diarrhea. Patient has minimal discomfort at most a one out of 10 and that's mainly with cleansing and debridement week by week that he knows this. 03/31/17 on evaluation today patient appears to be doing worse unfortunately. His wound appears to be infected and is more tender to palpation. Nothing has changed other than he has discontinued his antibiotic at this point. 04/07/17; patient continues to decline with erythema around the wound. Culture last week did not significantly culture and organism. There was no staph aureus no group A strep although the Gram Soto showed abundant gram-negative rods. The only culture we did when he initially came in here showed both enterococcus and Enterobacter. I gave him a course of Augmentin and things really seemed to settle down although I don't know that the Enterobacter  would've been treated well. He still complains of a lot of pain. He a lot of complaints about the wrap Benjamin Soto ([id number]) 04/14/17; the patient has a better-looking wound today however  he has less edema control now that we are not putting him under compression. X-ray I did last week was negative. Culture and sensitivity negative. No need for further antibiotics 04/21/17; the patient arrives today with a better-looking wound. Healthy granulation and reduction in the width of the wound. He did not do well with Hydrofera Blue we have been using Endo form 05/05/17 on evaluation today patient's left lower some the wound appears to be doing very well. We have been using the Endoform and noting excellent progress. Fortunately patient states that he has not been having any significant pain and is also likewise pleased with how things have progressed. This has done much better than the [facility] Dressing. No fevers, chills, nausea, or vomiting noted at this time. 05/12/17; nice progression healthy granulation with advancing epithelialization using Endo form Electronic Signature(s) Signed: 05/12/2017 5:02:24 PM By: Linton Ham MD Entered By: Linton Ham on 05/12/2017 10:11:40 Benjamin Soto ([id number]) -------------------------------------------------------------------------------- Physical Exam Details Patient Name: Benjamin Soto Date of Service: 05/12/2017 9:15 AM Medical Record Number: [id number] Patient Account Number: [id number] Date of Birth/Sex: Aug 03, 1936 (81 y.o. Male) Treating RN: Benjamin Soto Primary Care Provider: Elsie Soto Other Clinician: Referring Provider: Elsie Soto Treating Provider/Extender: Ricard Dillon Weeks in Treatment: 13 Constitutional Sitting or standing Blood Pressure is within target range for patient.. Pulse regular and within target range for patient.Marland Kitchen Respirations regular, non-labored and within target range.. Temperature is normal and within the target range for the patient.Marland Kitchen appears in no distress. Eyes Conjunctivae clear. No discharge. Cardiovascular Pedal pulses palpable on the left. There is no  edema. Lymphatic Nonpalpable in the popliteal or inguinal area. Psychiatric No evidence of depression, anxiety, or agitation. Calm, cooperative, and communicative. Appropriate interactions and affect.. Notes Wound exam; nice healthy granulated surface with advancing epithelialization. There is no erythema around the wound. This appears to be finally progressing towards closure. In the meantime he is had another fall with a gauze over his left elbow. Her intake nurse reported that this is actually closed over. I did not look at this today Electronic Signature(s) Signed: 05/12/2017 5:02:24 PM By: Linton Ham MD Entered By: Linton Ham on 05/12/2017 10:13:45 Benjamin Soto ([id number]) -------------------------------------------------------------------------------- Physician Orders Details Patient Name: Benjamin Soto Date of Service: 05/12/2017 9:15 AM Medical Record Number: [id number] Patient Account Number: [id number] Date of Birth/Sex: 18-Nov-1935 (81 y.o. Male) Treating RN: Benjamin Soto Primary Care Provider: Elsie Soto Other Clinician: Referring Provider: Elsie Soto Treating Provider/Extender: Benjamin Soto in Treatment: 44 Verbal / Phone Orders: No Diagnosis Coding Wound Cleansing Wound #1 Left,Midline Lower Leg o Clean wound with Normal Saline. o Cleanse wound with mild soap and water Anesthetic Wound #1 Left,Midline Lower Leg o Topical Lidocaine 4% cream applied to wound bed prior to debridement Primary Wound Dressing Wound #1 Left,Midline Lower Leg o Other: - Endoform Secondary Dressing Wound #1 Left,Midline Lower Leg o ABD and Kerlix/Conform Dressing Change Frequency Wound #1 Left,Midline Lower Leg o Other: - Friday and Monday Follow-up Appointments Wound #1 Left,Midline Lower Leg o Return Appointment in 1 week. Edema Control Wound #1 Left,Midline Lower Leg o Elevate legs to the level of the heart and pump ankles as often  as possible Additional Orders / Instructions Wound #1 Left,Midline Lower Leg o Increase protein intake. o Other: - Please add vitamin  A, vitamin C and zinc supplements to your diet Benjamin Soto, Benjamin Soto ([id number]) Electronic Signature(s) Signed: 05/12/2017 4:57:33 PM By: Gretta Cool, BSN, RN, CWS, Kim RN, BSN Signed: 05/12/2017 5:02:24 PM By: Linton Ham MD Entered By: Gretta Cool, BSN, RN, CWS, Kim on 05/12/2017 09:35:30 Benjamin Soto ([id number]) -------------------------------------------------------------------------------- Problem List Details Patient Name: Benjamin Soto Date of Service: 05/12/2017 9:15 AM Medical Record Number: [id number] Patient Account Number: [id number] Date of Birth/Sex: 1936-04-20 (81 y.o. 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 Male) Treating RN: Benjamin Soto Primary Care Provider: Elsie Soto Other Clinician: Referring Provider: Elsie Soto Treating Provider/Extender: Ricard Dillon Weeks in Treatment: 13 Subjective History of Present  Illness (HPI) 02/09/17; 81 year old man with a hx of type 2 DM, smoker. He suffered a fall 17 days ago while working on the [employer] of his trailer. He didn't realized anything had happed until he returned home. Has been applying polysporin. Received Cephalexin finishes on Saturday. No prior wound history. ABI's 0.96 on the left. No known hx of PAD. Hx of Afib on Coumadin,chf,st 2 crf 02/16/17; patient arrives today with the condition of his wound improved he is on silver alginate. Culture of the divot medially last week grew 2 different types of enterococcus including Enterococcus faecalis. He is on Augmentin. Surface of the wound looks better. Still debridement required today 02/23/17; traumatic wound on the left anterior leg. He has finished Augmentin. He found debridement last week painful fortunately none was required today 03/02/17; traumatic wound on the left anterior leg. Still 75% necrotic surface material requiring debridement. Most of the rest that it is stable 03/09/17; initially traumatic wound on his left leg. Arrives today with complaints of more pain and more erythema around the wound. States the pain is severe enough that it keeps him awake at night. The original wound surface itself looks good and the divot that was laterally has closed over. We switched him to [facility] last week. Wife shows me a lot of drainage coming out of the wound surface 03/16/17; the patient comes in today with a better-looking wound circumference no erythema. He is completed the doxycycline I gave him. He still complains of a lot of pain which he states radiates into the foot and keeps him awake at night. It seems that the original injury happened when he fell backwards and the anterior leg came up against part of a trailer hitch is the best I'm able to determine. He also hurt his lower coccyx. We have been using silver alginate since last week largely because of the infection 03/23/17; no major change in  this man's wounds. He has leaking edema fluid. Surface of the wound looks better we have been using silver alginate. Still complaining of a lot of discomfort 04/28/17 on evaluation today patient's left lower extremity wound on the interior shin appears to be doing extremely  well. There is slight hyper granulation but overall he has been tolerating the dressing changes without complication. There is no report of fevers, chills, nausea, vomiting, or diarrhea. Patient has minimal discomfort at most a one out of 10 and that's mainly with cleansing and debridement week by week that he knows this. 03/31/17 on evaluation today patient appears to be doing worse unfortunately. His wound appears to be infected and is more tender to palpation. Nothing has changed other than he has discontinued his antibiotic at this point. 04/07/17; patient continues to decline with erythema around the wound. Culture last week did not significantly culture and organism. There was no staph aureus no group A strep although the Gram Soto showed abundant gram-negative rods. The only culture we did when he initially came in here showed both enterococcus and Enterobacter. I gave him a course of Augmentin and things really seemed to settle down although I don't know that the Enterobacter would've been treated well. He still complains of a lot of pain. He a lot of complaints about the wrap 04/14/17; the patient has a better-looking wound today however he has less edema control now that we are not putting him under compression. X-ray I did last week was negative. Culture and sensitivity negative. No Benjamin Soto, Benjamin Soto ([id number]) need for further antibiotics 04/21/17; the patient arrives today with a better-looking wound. Healthy granulation and reduction in the width of the wound. He did not do well with Hydrofera Blue we have been using Endo form 05/05/17 on evaluation today patient's left lower some the wound appears to be doing very  well. We have been using the Endoform and noting excellent progress. Fortunately patient states that he has not been having any significant pain and is also likewise pleased with how things have progressed. This has done much better than the [facility] Dressing. No fevers, chills, nausea, or vomiting noted at this time. 05/12/17; nice progression healthy granulation with advancing epithelialization using Endo form Objective Constitutional Sitting or standing Blood Pressure is within target range for patient.. Pulse regular and within target range for patient.Marland Kitchen Respirations regular, non-labored and within target range.. Temperature is normal and within the target range for the patient.Marland Kitchen appears in no distress. Vitals Time Taken: 9:17 AM, Height: 69 in, Weight: 185 lbs, BMI: 27.3, Temperature: 97.4 F, Pulse: 71 bpm, Respiratory Rate: 16 breaths/min, Blood Pressure: 105/65 mmHg. Eyes Conjunctivae clear. No discharge. Cardiovascular Pedal pulses palpable on the left. There is no edema. Lymphatic Nonpalpable in the popliteal or inguinal area. Psychiatric No evidence of depression, anxiety, or agitation. Calm, cooperative, and communicative. Appropriate interactions and affect.. General Notes: Wound exam; nice healthy granulated surface with advancing epithelialization. There is no erythema around the wound. This appears to be finally progressing towards closure. In the meantime he is had another fall with a gauze over his left elbow. Her intake nurse reported that this is actually closed over. I did not look at this today Integumentary (Hair, Skin) Wound #1 status is Open. Original cause of wound was Trauma. The wound is located on the Left,Midline Lower Leg. The wound measures 2.5cm length x 1cm width x 0.1cm depth; 1.963cm^2 area and Soto, Benjamin ([id number]) 0.196cm^3 volume. There is Fat Layer (Subcutaneous Tissue) Exposed exposed. There is no tunneling or undermining noted. There  is a medium amount of serosanguineous drainage noted. The wound margin is flat and intact. There is large (67-100%) red granulation within the wound bed. There is no necrotic tissue within the wound bed. The periwound  skin appearance exhibited: Excoriation, Ecchymosis. The periwound skin appearance did not exhibit: Callus, Crepitus, Induration, Rash, Scarring, Dry/Scaly, Maceration, Atrophie Blanche, Cyanosis, Hemosiderin Staining, Mottled, Pallor, Rubor, Erythema. Periwound temperature was noted as No Abnormality. The periwound has tenderness on palpation. 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 Cleanse wound with mild soap and water Anesthetic: Wound #1 Left,Midline Lower Leg: Topical Lidocaine 4% cream applied to wound bed prior to debridement Primary Wound Dressing: Wound #1 Left,Midline Lower Leg: Other: - Endoform Secondary Dressing: Wound #1 Left,Midline Lower Leg: ABD and Kerlix/Conform Dressing Change Frequency: Wound #1 Left,Midline Lower Leg: Other: - Friday and Monday Follow-up Appointments: Wound #1 Left,Midline Lower Leg: Return Appointment in 1 week. Edema Control: Wound #1 Left,Midline Lower Leg: Elevate legs to the level of the heart and pump ankles as often as possible Additional Orders / InstructionsJAYSE, Benjamin Soto ([id number]) Wound #1 Left,Midline Lower Leg: Increase protein intake. Other: - Please add vitamin A, vitamin C and zinc supplements to your diet contin ue with endoform/ABD/kerlix and conform Electronic Signature(s) Signed: 05/12/2017 5:02:24 PM By: Linton Ham MD Entered By: Linton Ham on 05/12/2017 10:15:16 Benjamin Soto  ([id number]) -------------------------------------------------------------------------------- Nathalie Details Patient Name: Benjamin Soto Date of Service: 05/12/2017 Medical Record Number: [id number] Patient Account Number: [id number] Date of Birth/Sex: 01-22-1936 (81 y.o. 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

## 2017-05-13 NOTE — Progress Notes (Signed)
Benjamin, Soto ([id number]) Visit Report for 05/12/2017 Arrival Information Details Patient Name: Benjamin Soto, Benjamin Soto Date of Service: 05/12/2017 9:15 AM Medical Record Number: [id number] Patient Account Number: [id number] Date of Birth/Sex: January 10, 1936 (81 y.o. Male) Treating RN: Benjamin Soto Primary Care Benjamin Soto: Benjamin Soto Other Clinician: Referring Benjamin Soto: Benjamin Soto Treating Benjamin Soto/Extender: Benjamin Soto in Treatment: 13 Visit Information History Since Last Visit Added or deleted any medications: No Patient Arrived: Ambulatory Any new allergies or adverse reactions: No Arrival Time: 09:16 Had a fall or experienced change in No Accompanied By: wife activities of daily living that may affect Transfer Assistance: None risk of falls: Patient Identification Verified: Yes Signs or symptoms of abuse/neglect since last No Secondary Verification Process Yes visito Completed: Hospitalized since last visit: No Patient Requires Transmission- No Has Dressing in Place as Prescribed: Yes Based Precautions: Pain Present Now: No Patient Has Alerts: Yes Patient Alerts: Patient on Blood Thinner warfarin Type II Diabetic Electronic Signature(s) Signed: 05/12/2017 4:57:33 PM By: Benjamin Soto, BSN, RN, CWS, Kim RN, BSN Entered By: Benjamin Soto, BSN, RN, CWS, Benjamin Soto on 05/12/2017 09:17:11 Benjamin Soto ([id number]) -------------------------------------------------------------------------------- Clinic Level of Care Assessment Details Patient Name: Benjamin Soto Date of Service: 05/12/2017 9:15 AM Medical Record Number: [id number] Patient Account Number: [id number] Date of Birth/Sex: [birth year] (81 y.o. Male) Treating RN: Benjamin Soto Primary Care Jamea Robicheaux: Benjamin Soto Other Clinician: Referring Benjamin Soto: Benjamin Soto Treating Benjamin Soto/Extender: Benjamin Soto in Treatment: 13 Clinic Level of Care Assessment Items TOOL 4 Quantity Score []  - Use when only an EandM is  performed on FOLLOW-UP visit 0 ASSESSMENTS - Nursing Assessment / Reassessment []  - Reassessment of Co-morbidities (includes updates in patient status) 0 X - Reassessment of Adherence to Treatment Plan 1 5 ASSESSMENTS - Wound and Skin Assessment / Reassessment X - Simple Wound Assessment / Reassessment - one wound 1 5 []  - Complex Wound Assessment / Reassessment - multiple wounds 0 []  - Dermatologic / Skin Assessment (not related to wound area) 0 ASSESSMENTS - Focused Assessment []  - Circumferential Edema Measurements - multi extremities 0 []  - Nutritional Assessment / Counseling / Intervention 0 []  - Lower Extremity Assessment (monofilament, tuning fork, pulses) 0 []  - Peripheral Arterial Disease Assessment (using hand held doppler) 0 ASSESSMENTS - Ostomy and/or Continence Assessment and Care []  - Incontinence Assessment and Management 0 []  - Ostomy Care Assessment and Management (repouching, etc.) 0 PROCESS - Coordination of Care X - Simple Patient / Family Education for ongoing care 1 15 []  - Complex (extensive) Patient / Family Education for ongoing care 0 []  - Staff obtains Programmer, systems, Records, Test Results / Process Orders 0 []  - Staff telephones HHA, Nursing Homes / Clarify orders / etc 0 []  - Routine Transfer to another Facility (non-emergent condition) 0 Soto, Benjamin ([id number]) []  - Routine Hospital Admission (non-emergent condition) 0 []  - New Admissions / Biomedical engineer / Ordering NPWT, Apligraf, etc. 0 []  - Emergency Hospital Admission (emergent condition) 0 X - Simple Discharge Coordination 1 10 []  - Complex (extensive) Discharge Coordination 0 PROCESS - Special Needs []  - Pediatric / Minor Patient Management 0 []  - Isolation Patient Management 0 []  - Hearing / Language / Visual special needs 0 []  - Assessment of Community assistance (transportation, D/C planning, etc.) 0 []  - Additional assistance / Altered mentation 0 []  - Support Surface(s) Assessment  (bed, cushion, seat, etc.) 0 INTERVENTIONS - Wound Cleansing / Measurement X - Simple Wound Cleansing - one wound 1 5 []  - Complex Wound Cleansing - multiple wounds 0  X - Wound Imaging (photographs - any number of wounds) 1 5 []  - Wound Tracing (instead of photographs) 0 X - Simple Wound Measurement - one wound 1 5 []  - Complex Wound Measurement - multiple wounds 0 INTERVENTIONS - Wound Dressings X - Small Wound Dressing one or multiple wounds 1 10 []  - Medium Wound Dressing one or multiple wounds 0 []  - Large Wound Dressing one or multiple wounds 0 []  - Application of Medications - topical 0 []  - Application of Medications - injection 0 INTERVENTIONS - Miscellaneous []  - External ear exam 0 Benjamin Soto, Benjamin Soto ([id number]) []  - Specimen Collection (cultures, biopsies, blood, body fluids, etc.) 0 []  - Specimen(s) / Culture(s) sent or taken to Lab for analysis 0 []  - Patient Transfer (multiple staff / Harrel Lemon Lift / Similar devices) 0 []  - Simple Staple / Suture removal (25 or less) 0 []  - Complex Staple / Suture removal (26 or more) 0 []  - Hypo / Hyperglycemic Management (close monitor of Blood Glucose) 0 []  - Ankle / Brachial Index (ABI) - do not check if billed separately 0 X - Vital Signs 1 5 Has the patient been seen at the hospital within the last three years: Yes Total Score: 65 Level Of Care: New/Established - Level 2 Electronic Signature(s) Signed: 05/12/2017 4:57:33 PM By: Benjamin Soto, BSN, RN, CWS, Kim RN, BSN Entered By: Benjamin Soto, BSN, RN, CWS, Benjamin Soto on 05/12/2017 09:35:58 Benjamin Soto ([id number]) -------------------------------------------------------------------------------- Encounter Discharge Information Details Patient Name: Benjamin Soto Date of Service: 05/12/2017 9:15 AM Medical Record Number: [id number] Patient Account Number: [id number] Date of Birth/Sex: Oct 05, 1935 (81 y.o. Male) Treating RN: Benjamin Soto Primary Care Benjamin Soto: Benjamin Soto Other Clinician: Referring  Benjamin Soto: Benjamin Soto Treating Benjamin Soto/Extender: Benjamin Soto in Treatment: 13 Encounter Discharge Information Items Discharge Pain Level: 0 Discharge Condition: Stable Ambulatory Status: Ambulatory Discharge Destination: Home Transportation: Private Auto Accompanied By: self Schedule Follow-up Appointment: Yes Medication Reconciliation completed and provided to Patient/Care Yes Breuna Loveall: Provided on Clinical Summary of Care: 05/12/2017 Form Type Recipient Paper Patient HD Electronic Signature(s) Signed: 05/12/2017 4:57:33 PM By: Benjamin Soto, BSN, RN, CWS, Kim RN, BSN Entered By: Benjamin Soto, BSN, RN, CWS, Benjamin Soto on 05/12/2017 09:36:52 Benjamin Soto ([id number]) -------------------------------------------------------------------------------- Lower Extremity Assessment Details Patient Name: Benjamin Soto Date of Service: 05/12/2017 9:15 AM Medical Record Number: [id number] Patient Account Number: [id number] Date of Birth/Sex: Feb 10, 1936 (81 y.o. Male) Treating RN: Benjamin Soto Primary Care Ethelmae Ringel: Benjamin Soto Other Clinician: Referring Joleah Kosak: Benjamin Soto Treating Willliam Pettet/Extender: Ricard Dillon Weeks in Treatment: 13 Edema Assessment Assessed: [Left: No] [Right: No] Edema: [Left: N] [Right: o] Vascular Assessment Claudication: Claudication Assessment [Left:None] Pulses: Dorsalis Pedis Palpable: [Left:Yes] Posterior Tibial Extremity colors, hair growth, and conditions: Extremity Color: [Left:Normal] Hair Growth on Extremity: [Left:Yes] Temperature of Extremity: [Left:Warm] Capillary Refill: [Left:< 3 seconds] Dependent Rubor: [Left:No] Blanched when Elevated: [Left:No] Lipodermatosclerosis: [Left:No] Toe Nail Assessment Left: Right: Thick: No Discolored: No Deformed: No Improper Length and Hygiene: No Electronic Signature(s) Signed: 05/12/2017 4:57:33 PM By: Benjamin Soto, BSN, RN, CWS, Kim RN, BSN Entered By: Benjamin Soto, BSN, RN, CWS, Benjamin Soto on 05/12/2017  09:25:40 Benjamin Soto ([id number]) -------------------------------------------------------------------------------- Multi Wound Chart Details Patient Name: Benjamin Soto Date of Service: 05/12/2017 9:15 AM Medical Record Number: [id number] Patient Account Number: [id number] Date of Birth/Sex: [birth year] (81 y.o. Male) Treating RN: Benjamin Soto Primary Care Avishai Reihl: Benjamin Soto Other Clinician: Referring Ollie Esty: Benjamin Soto Treating Huberta Tompkins/Extender: Ricard Dillon Weeks in Treatment: 13 Vital Signs Height(in): 69 Pulse(bpm): 71 Weight(lbs): 185 Blood Pressure 105/65 (mmHg): Body Mass Index(BMI):  27 Temperature(F): 97.4 Respiratory Rate 16 (breaths/min): Photos: [N/A:N/A] Wound Location: Left Lower Leg - Midline N/A N/A Wounding Event: Trauma N/A N/A Primary Etiology: Trauma, Other N/A N/A Comorbid History: Glaucoma, Chronic N/A N/A Obstructive Pulmonary Disease (COPD), Arrhythmia, Coronary Artery Disease, Hypertension, Type II Diabetes, Osteoarthritis, Neuropathy Date Acquired: 01/23/2017 N/A N/A Weeks of Treatment: 13 N/A N/A Wound Status: Open N/A N/A Measurements L x W x D 2.5x1x0.1 N/A N/A (cm) Area (cm) : 1.963 N/A N/A Volume (cm) : 0.196 N/A N/A % Reduction in Area: 61.00% N/A N/A % Reduction in Volume: 61.00% N/A N/A Classification: Full Thickness Without N/A N/A Exposed Support Structures HBO Classification: Grade 1 N/A N/A Exudate Amount: Medium N/A N/A Benjamin Soto, Benjamin Soto ([id number]) Exudate Type: Serosanguineous N/A N/A Exudate Color: red, brown N/A N/A Wound Margin: Flat and Intact N/A N/A Granulation Amount: Large (67-100%) N/A N/A Granulation Quality: Red, Hyper-granulation N/A N/A Necrotic Amount: None Present (0%) N/A N/A Exposed Structures: Fat Layer (Subcutaneous N/A N/A Tissue) Exposed: Yes Fascia: No Tendon: No Muscle: No Joint: No Bone: No Epithelialization: Medium (34-66%) N/A N/A Periwound Skin Texture: Excoriation:  Yes N/A N/A Induration: No Callus: No Crepitus: No Rash: No Scarring: No Periwound Skin Maceration: No N/A N/A Moisture: Dry/Scaly: No Periwound Skin Color: Ecchymosis: Yes N/A N/A Atrophie Blanche: No Cyanosis: No Erythema: No Hemosiderin Staining: No Mottled: No Pallor: No Rubor: No Temperature: No Abnormality N/A N/A Tenderness on Yes N/A N/A Palpation: Wound Preparation: Ulcer Cleansing: N/A N/A Rinsed/Irrigated with Saline Topical Anesthetic Applied: None Treatment Notes Wound #1 (Left, Midline Lower Leg) 1. Cleansed with: Clean wound with Normal Saline 2. Anesthetic Topical Lidocaine 4% cream to wound bed prior to debridement 4. Dressing Applied: Other dressing (specify in notes) 5. Secondary Dressing Applied Benjamin Soto, Benjamin Soto ([id number]) ABD and Kerlix/Conform 7. Secured with Tape Notes Endoform Electronic Signature(s) Signed: 05/12/2017 5:02:24 PM By: Linton Ham MD Entered By: Linton Ham on 05/12/2017 10:11:00 Benjamin Soto ([id number]) -------------------------------------------------------------------------------- [place] Details Patient Name: Benjamin Soto Date of Service: 05/12/2017 9:15 AM Medical Record Number: [id number] Patient Account Number: [id number] Date of Birth/Sex: 07/01/1936 (81 y.o. Male) Treating RN: Benjamin Soto Primary Care Shaunette Gassner: Benjamin Soto Other Clinician: Referring Erbie Arment: Benjamin Soto Treating Laveda Demedeiros/Extender: Benjamin Soto in Treatment: 13 Active Inactive ` Abuse / Safety / Falls / Self Care Management Nursing Diagnoses: Potential for falls Goals: Patient will not experience any injury related to falls Date Initiated: 02/09/2017 Target Resolution Date: 04/23/2017 Goal Status: Active Interventions: Assess fall risk on admission and as needed Notes: ` Orientation to the Wound Care Program Nursing Diagnoses: Knowledge deficit related to the wound healing center  program Goals: Patient/caregiver will verbalize understanding of the [facility] Date Initiated: 02/09/2017 Target Resolution Date: 04/23/2017 Goal Status: Active Interventions: Provide education on orientation to the wound center Notes: ` Wound/Skin Impairment Nursing Diagnoses: Impaired tissue integrity Benjamin Soto, Benjamin Soto ([id number]) Goals: Patient/caregiver will verbalize understanding of skin care regimen Date Initiated: 02/09/2017 Target Resolution Date: 04/23/2017 Goal Status: Active Ulcer/skin breakdown will have a volume reduction of 30% by week 4 Date Initiated: 02/09/2017 Target Resolution Date: 04/23/2017 Goal Status: Active Ulcer/skin breakdown will have a volume reduction of 50% by week 8 Date Initiated: 02/09/2017 Target Resolution Date: 04/23/2017 Goal Status: Active Ulcer/skin breakdown will have a volume reduction of 80% by week 12 Date Initiated: 02/09/2017 Target Resolution Date: 04/23/2017 Goal Status: Active Ulcer/skin breakdown will heal within 14 weeks Date Initiated: 02/09/2017 Target Resolution Date: 04/23/2017 Goal Status: Active Interventions: Assess patient/caregiver ability  to obtain necessary supplies Assess patient/caregiver ability to perform ulcer/skin care regimen upon admission and as needed Assess ulceration(s) every visit Notes: Electronic Signature(s) Signed: 05/12/2017 4:57:33 PM By: Benjamin Soto, BSN, RN, CWS, Kim RN, BSN Entered By: Benjamin Soto, BSN, RN, CWS, Benjamin Soto on 05/12/2017 09:25:47 Benjamin Soto ([id number]) -------------------------------------------------------------------------------- Pain Assessment Details Patient Name: Benjamin Soto Date of Service: 05/12/2017 9:15 AM Medical Record Number: [id number] Patient Account Number: [id number] Date of Birth/Sex: June 14, 1936 (81 y.o. Male) Treating RN: Benjamin Soto Primary Care Gredmarie Delange: Benjamin Soto Other Clinician: Referring Alassane Kalafut: Benjamin Soto Treating Corday Wyka/Extender: Benjamin Soto in Treatment: 13 Active Problems Location of Pain Severity and Description of Pain Patient Has Paino No Site Locations With Dressing Change: No Pain Management and Medication Current Pain Management: Goals for Pain Management Topical or injectable lidocaine is offered to patient for acute pain when surgical debridement is performed. If needed, Patient is instructed to use over the counter pain medication for the following 24-48 hours after debridement. Wound care MDs do not prescribed pain medications. Patient has chronic pain or uncontrolled pain. Patient has been instructed to make an appointment with their Primary Care Physician for pain management. Electronic Signature(s) Signed: 05/12/2017 4:57:33 PM By: Benjamin Soto, BSN, RN, CWS, Kim RN, BSN Entered By: Benjamin Soto, BSN, RN, CWS, Benjamin Soto on 05/12/2017 09:17:37 Benjamin Soto ([id number]) -------------------------------------------------------------------------------- Patient/Caregiver Education Details Patient Name: Benjamin Soto Date of Service: 05/12/2017 9:15 AM Medical Record Number: [id number] Patient Account Number: [id number] Date of Birth/Gender: 1935-11-12 (81 y.o. Male) Treating RN: Benjamin Soto Primary Care Physician: Benjamin Soto Other Clinician: Referring Physician: Elsie Soto Treating Physician/Extender: Benjamin Soto in Treatment: 13 Education Assessment Education Provided To: Patient Education Topics Provided Wound/Skin Impairment: Handouts: Caring for Your Ulcer, Other: wound care as prescribed Methods: Demonstration Responses: State content correctly Electronic Signature(s) Signed: 05/12/2017 4:57:33 PM By: Benjamin Soto, BSN, RN, CWS, Kim RN, BSN Entered By: Benjamin Soto, BSN, RN, CWS, Benjamin Soto on 05/12/2017 09:37:11 Benjamin Soto ([id number]) -------------------------------------------------------------------------------- Wound Assessment Details Patient Name: Benjamin Soto Date of Service: 05/12/2017  9:15 AM Medical Record Number: [id number] Patient Account Number: [id number] Date of Birth/Sex: [birth year] (81 y.o. Male) Treating RN: Benjamin Soto Primary Care Hobie Kohles: Benjamin Soto Other Clinician: Referring Donielle Radziewicz: Benjamin Soto Treating Breunna Nordmann/Extender: Ricard Dillon Weeks in Treatment: 13 Wound Status Wound Number: 1 Primary Trauma, Other Etiology: Wound Location: Left Lower Leg - Midline Wound Open Wounding Event: Trauma Status: Date Acquired: 01/23/2017 Comorbid Glaucoma, Chronic Obstructive Weeks Of Treatment: 13 History: Pulmonary Disease (COPD), Arrhythmia, Clustered Wound: No Coronary Artery Disease, Hypertension, Type II Diabetes, Osteoarthritis, Neuropathy Photos Wound Measurements Length: (cm) 2.5 Width: (cm) 1 Depth: (cm) 0.1 Area: (cm) 1.963 Volume: (cm) 0.196 % Reduction in Area: 61% % Reduction in Volume: 61% Epithelialization: Medium (34-66%) Tunneling: No Undermining: No Wound Description Full Thickness Without Exposed Foul Odor A Classification: Support Structures Slough/Fibr Diabetic Severity Grade 1 (Wagner): Wound Margin: Flat and Intact Exudate Amount: Medium Exudate Type: Serosanguineous Exudate Color: red, brown fter Cleansing: No ino No Wound Bed Granulation Amount: Large (67-100%) Exposed Structure Granulation Quality: Red, Hyper-granulation Fascia Exposed: No Benjamin Soto, Benjamin Soto ([id number]) Necrotic Amount: None Present (0%) Fat Layer (Subcutaneous Tissue) Exposed: Yes Tendon Exposed: No Muscle Exposed: No Joint Exposed: No Bone Exposed: No Periwound Skin Texture Texture Color No Abnormalities Noted: No No Abnormalities Noted: No Callus: No Atrophie Blanche: No Crepitus: No Cyanosis: No Excoriation: Yes Ecchymosis: Yes Induration: No Erythema: No Rash: No Hemosiderin Staining: No Scarring: No Mottled: No Pallor: No Moisture Rubor: No No Abnormalities Noted: No  Dry / Scaly: No Temperature /  Pain Maceration: No Temperature: No Abnormality Tenderness on Palpation: Yes Wound Preparation Ulcer Cleansing: Rinsed/Irrigated with Saline Topical Anesthetic Applied: None Treatment Notes Wound #1 (Left, Midline Lower Leg) 1. Cleansed with: Clean wound with Normal Saline 2. Anesthetic Topical Lidocaine 4% cream to wound bed prior to debridement 4. Dressing Applied: Other dressing (specify in notes) 5. Secondary Dressing Applied ABD and Kerlix/Conform 7. Secured with Tape Notes Environmental education officer) Signed: 05/12/2017 4:57:33 PM By: Benjamin Soto, BSN, RN, CWS, Kim RN, BSN Entered By: Benjamin Soto, BSN, RN, CWS, Benjamin Soto on 05/12/2017 09:24:43 Benjamin Soto ([id number]) -------------------------------------------------------------------------------- [place] Details Patient Name: Benjamin Soto Date of Service: 05/12/2017 9:15 AM Medical Record Number: [id number] Patient Account Number: [id number] Date of Birth/Sex: 1935/11/21 (81 y.o. Male) Treating RN: Benjamin Soto Primary Care Maya Scholer: Benjamin Soto Other Clinician: Referring Raheen Capili: Benjamin Soto Treating Alane Hanssen/Extender: Benjamin Soto in Treatment: 13 Vital Signs Time Taken: 09:17 Temperature (F): 97.4 Height (in): 69 Pulse (bpm): 71 Weight (lbs): 185 Respiratory Rate (breaths/min): 16 Body Mass Index (BMI): 27.3 Blood Pressure (mmHg): 105/65 Reference Range: 80 - 120 mg / dl Electronic Signature(s) Signed: 05/12/2017 4:57:33 PM By: Benjamin Soto, BSN, RN, CWS, Kim RN, BSN Entered By: Benjamin Soto, BSN, RN, CWS, Benjamin Soto on 05/12/2017 09:17:59

## 2017-05-17 ENCOUNTER — Other Ambulatory Visit: Payer: Self-pay | Admitting: Family Medicine

## 2017-05-19 ENCOUNTER — Encounter: Payer: Medicare Other | Admitting: Internal Medicine

## 2017-05-19 DIAGNOSIS — J449 Chronic obstructive pulmonary disease, unspecified: Secondary | ICD-10-CM | POA: Diagnosis not present

## 2017-05-19 DIAGNOSIS — I2510 Atherosclerotic heart disease of native coronary artery without angina pectoris: Secondary | ICD-10-CM | POA: Diagnosis not present

## 2017-05-19 DIAGNOSIS — S81802A Unspecified open wound, left lower leg, initial encounter: Secondary | ICD-10-CM | POA: Diagnosis not present

## 2017-05-19 DIAGNOSIS — I110 Hypertensive heart disease with heart failure: Secondary | ICD-10-CM | POA: Diagnosis not present

## 2017-05-19 DIAGNOSIS — Z7901 Long term (current) use of anticoagulants: Secondary | ICD-10-CM | POA: Diagnosis not present

## 2017-05-19 DIAGNOSIS — L03116 Cellulitis of left lower limb: Secondary | ICD-10-CM | POA: Diagnosis not present

## 2017-05-19 DIAGNOSIS — L97823 Non-pressure chronic ulcer of other part of left lower leg with necrosis of muscle: Secondary | ICD-10-CM | POA: Diagnosis not present

## 2017-05-19 DIAGNOSIS — S81812A Laceration without foreign body, left lower leg, initial encounter: Secondary | ICD-10-CM | POA: Diagnosis not present

## 2017-05-19 DIAGNOSIS — E11622 Type 2 diabetes mellitus with other skin ulcer: Secondary | ICD-10-CM | POA: Diagnosis not present

## 2017-05-19 DIAGNOSIS — E1140 Type 2 diabetes mellitus with diabetic neuropathy, unspecified: Secondary | ICD-10-CM | POA: Diagnosis not present

## 2017-05-19 DIAGNOSIS — L97223 Non-pressure chronic ulcer of left calf with necrosis of muscle: Secondary | ICD-10-CM | POA: Diagnosis not present

## 2017-05-19 DIAGNOSIS — N182 Chronic kidney disease, stage 2 (mild): Secondary | ICD-10-CM | POA: Diagnosis not present

## 2017-05-19 DIAGNOSIS — I130 Hypertensive heart and chronic kidney disease with heart failure and stage 1 through stage 4 chronic kidney disease, or unspecified chronic kidney disease: Secondary | ICD-10-CM | POA: Diagnosis not present

## 2017-05-19 DIAGNOSIS — E1122 Type 2 diabetes mellitus with diabetic chronic kidney disease: Secondary | ICD-10-CM | POA: Diagnosis not present

## 2017-05-19 DIAGNOSIS — I4891 Unspecified atrial fibrillation: Secondary | ICD-10-CM | POA: Diagnosis not present

## 2017-05-19 DIAGNOSIS — I509 Heart failure, unspecified: Secondary | ICD-10-CM | POA: Diagnosis not present

## 2017-05-21 NOTE — Progress Notes (Signed)
Benjamin Soto ([id number]) Visit Report for 05/19/2017 Arrival Information Details Patient Name: Benjamin Soto, Benjamin Soto Date of Service: 05/19/2017 9:15 AM Medical Record Number: [id number] Patient Account Number: [id number] Date of Birth/Sex: November 11, 1935 (81 y.o. Male) Treating RN: Benjamin Soto Primary Care Benjamin Soto: Benjamin Soto Other Clinician: Referring Benjamin Soto: Benjamin Soto Treating Benjamin Soto/Extender: Benjamin Soto in Treatment: 14 Visit Information History Since Last Visit Added or deleted any medications: No Patient Arrived: Ambulatory Any new allergies or adverse reactions: No Arrival Time: 09:12 Had a fall or experienced change in No Accompanied By: wife activities of daily living that may affect Transfer Assistance: None risk of falls: Patient Identification Verified: Yes Signs or symptoms of abuse/neglect since last No Secondary Verification Process Yes visito Completed: Hospitalized since last visit: No Patient Requires Transmission- No Has Dressing in Place as Prescribed: Yes Based Precautions: Pain Present Now: No Patient Has Alerts: Yes Patient Alerts: Patient on Blood Thinner warfarin Type II Diabetic Electronic Signature(s) Signed: 05/19/2017 5:49:32 PM By: Benjamin Soto, BSN, RN, CWS, Kim RN, BSN Entered By: Benjamin Soto, BSN, RN, CWS, Kim on 05/19/2017 09:12:43 Benjamin Soto ([id number]) -------------------------------------------------------------------------------- Clinic Level of Care Assessment Details Patient Name: Benjamin Soto Date of Service: 05/19/2017 9:15 AM Medical Record Number: [id number] Patient Account Number: [id number] Date of Birth/Sex: 08-Nov-1935 (81 y.o. Male) Treating RN: Benjamin Soto Primary Care Benjamin Soto: Benjamin Soto Other Clinician: Referring Benjamin Soto: Benjamin Soto Treating Baylea Milburn/Extender: Benjamin Soto in Treatment: 14 Clinic Level of Care Assessment Items TOOL 4 Quantity Score []  - Use when only an EandM is  performed on FOLLOW-UP visit 0 ASSESSMENTS - Nursing Assessment / Reassessment []  - Reassessment of Co-morbidities (includes updates in patient status) 0 X - Reassessment of Adherence to Treatment Plan 1 5 ASSESSMENTS - Wound and Skin Assessment / Reassessment X - Simple Wound Assessment / Reassessment - one wound 1 5 []  - Complex Wound Assessment / Reassessment - multiple wounds 0 []  - Dermatologic / Skin Assessment (not related to wound area) 0 ASSESSMENTS - Focused Assessment []  - Circumferential Edema Measurements - multi extremities 0 []  - Nutritional Assessment / Counseling / Intervention 0 []  - Lower Extremity Assessment (monofilament, tuning fork, pulses) 0 []  - Peripheral Arterial Disease Assessment (using hand held doppler) 0 ASSESSMENTS - Ostomy and/or Continence Assessment and Care []  - Incontinence Assessment and Management 0 []  - Ostomy Care Assessment and Management (repouching, etc.) 0 PROCESS - Coordination of Care X - Simple Patient / Family Education for ongoing care 1 15 []  - Complex (extensive) Patient / Family Education for ongoing care 0 []  - Staff obtains Programmer, systems, Records, Test Results / Process Orders 0 []  - Staff telephones HHA, Nursing Homes / Clarify orders / etc 0 []  - Routine Transfer to another Facility (non-emergent condition) 0 Benjamin Soto ([id number]) []  - Routine Hospital Admission (non-emergent condition) 0 []  - New Admissions / Biomedical engineer / Ordering NPWT, Apligraf, etc. 0 []  - Emergency Hospital Admission (emergent condition) 0 X - Simple Discharge Coordination 1 10 []  - Complex (extensive) Discharge Coordination 0 PROCESS - Special Needs []  - Pediatric / Minor Patient Management 0 []  - Isolation Patient Management 0 []  - Hearing / Language / Visual special needs 0 []  - Assessment of Community assistance (transportation, D/C planning, etc.) 0 []  - Additional assistance / Altered mentation 0 []  - Support Surface(s) Assessment  (bed, cushion, seat, etc.) 0 INTERVENTIONS - Wound Cleansing / Measurement X - Simple Wound Cleansing - one wound 1 5 []  - Complex Wound Cleansing - multiple wounds 0  X - Wound Imaging (photographs - any number of wounds) 1 5 []  - Wound Tracing (instead of photographs) 0 X - Simple Wound Measurement - one wound 1 5 []  - Complex Wound Measurement - multiple wounds 0 INTERVENTIONS - Wound Dressings []  - Small Wound Dressing one or multiple wounds 0 X - Medium Wound Dressing one or multiple wounds 1 15 []  - Large Wound Dressing one or multiple wounds 0 []  - Application of Medications - topical 0 []  - Application of Medications - injection 0 INTERVENTIONS - Miscellaneous []  - External ear exam 0 Benjamin Soto ([id number]) []  - Specimen Collection (cultures, biopsies, blood, body fluids, etc.) 0 []  - Specimen(s) / Culture(s) sent or taken to Lab for analysis 0 []  - Patient Transfer (multiple staff / Harrel Lemon Lift / Similar devices) 0 []  - Simple Staple / Suture removal (25 or less) 0 []  - Complex Staple / Suture removal (26 or more) 0 []  - Hypo / Hyperglycemic Management (close monitor of Blood Glucose) 0 []  - Ankle / Brachial Index (ABI) - do not check if billed separately 0 X - Vital Signs 1 5 Has the patient been seen at the hospital within the last three years: Yes Total Score: 70 Level Of Care: New/Established - Level 2 Electronic Signature(s) Signed: 05/19/2017 5:49:32 PM By: Benjamin Soto, BSN, RN, CWS, Kim RN, BSN Entered By: Benjamin Soto, BSN, RN, CWS, Kim on 05/19/2017 Benjamin Soto, [name] ([id number]) -------------------------------------------------------------------------------- Encounter Discharge Information Details Patient Name: Benjamin Soto Date of Service: 05/19/2017 9:15 AM Medical Record Number: [id number] Patient Account Number: [id number] Date of Birth/Sex: [birth year] (81 y.o. Male) Treating RN: Benjamin Soto Primary Care Benjamin Soto: Benjamin Soto Other Clinician: Referring  Benjamin Soto: Benjamin Soto Treating Benjamin Soto/Extender: Benjamin Soto in Treatment: 14 Encounter Discharge Information Items Discharge Pain Level: 0 Discharge Condition: Stable Ambulatory Status: Ambulatory Discharge Destination: Home Transportation: Private Auto Accompanied By: self Schedule Follow-up Appointment: Yes Medication Reconciliation completed and provided to Patient/Care Yes Jaquaveon Bilal: Provided on Clinical Summary of Care: 05/19/2017 Form Type Recipient Paper Patient HD Electronic Signature(s) Signed: 05/19/2017 5:49:32 PM By: Benjamin Soto, BSN, RN, CWS, Kim RN, BSN Entered By: Benjamin Soto, BSN, RN, CWS, Kim on 05/19/2017 09:35:03 Benjamin Soto ([id number]) -------------------------------------------------------------------------------- Lower Extremity Assessment Details Patient Name: Benjamin Soto Date of Service: 05/19/2017 9:15 AM Medical Record Number: [id number] Patient Account Number: [id number] Date of Birth/Sex: 1936/01/12 (81 y.o. Male) Treating RN: Benjamin Soto Primary Care Chandi Nicklin: Benjamin Soto Other Clinician: Referring Perry Molla: Benjamin Soto Treating Deazia Lampi/Extender: Ricard Dillon Weeks in Treatment: 14 Vascular Assessment Pulses: Dorsalis Pedis Palpable: [Left:Yes] Posterior Tibial Extremity colors, hair growth, and conditions: Extremity Color: [Left:Normal] Hair Growth on Extremity: [Left:No] Temperature of Extremity: [Left:Warm] Capillary Refill: [Left:< 3 seconds] Toe Nail Assessment Left: Right: Thick: No Discolored: No Deformed: No Improper Length and Hygiene: No Electronic Signature(s) Signed: 05/19/2017 5:49:32 PM By: Benjamin Soto, BSN, RN, CWS, Kim RN, BSN Entered By: Benjamin Soto, BSN, RN, CWS, Kim on 05/19/2017 09:18:28 Benjamin Soto ([id number]) -------------------------------------------------------------------------------- Multi Wound Chart Details Patient Name: Benjamin Soto Date of Service: 05/19/2017 9:15 AM Medical Record  Number: [id number] Patient Account Number: [id number] Date of Birth/Sex: Nov 02, 1935 (81 y.o. Male) Treating RN: Benjamin Soto Primary Care Myya Meenach: Benjamin Soto Other Clinician: Referring Yassir Enis: Benjamin Soto Treating Benedicto Capozzi/Extender: Ricard Dillon Weeks in Treatment: 14 Vital Signs Height(in): 69 Pulse(bpm): 80 Weight(lbs): 185 Blood Pressure 127/64 (mmHg): Body Mass Index(BMI): 27 Temperature(F): 97.6 Respiratory Rate 16 (breaths/min): Photos: [N/A:N/A] Wound Location: Left Lower Leg - Midline N/A N/A Wounding Event: Trauma N/A N/A Primary Etiology:  Trauma, Other N/A N/A Comorbid History: Glaucoma, Chronic N/A N/A Obstructive Pulmonary Disease (COPD), Arrhythmia, Coronary Artery Disease, Hypertension, Type II Diabetes, Osteoarthritis, Neuropathy Date Acquired: 01/23/2017 N/A N/A Weeks of Treatment: 14 N/A N/A Wound Status: Open N/A N/A Measurements L x W x D 2x0.5x0.1 N/A N/A (cm) Area (cm) : 0.785 N/A N/A Volume (cm) : 0.079 N/A N/A % Reduction in Area: 84.40% N/A N/A % Reduction in Volume: 84.30% N/A N/A Classification: Full Thickness Without N/A N/A Exposed Support Structures Exudate Amount: Medium N/A N/A Exudate Type: Serosanguineous N/A N/A ESAIAH, WANLESS ([id number]) Exudate Color: red, brown N/A N/A Wound Margin: Flat and Intact N/A N/A Granulation Amount: Large (67-100%) N/A N/A Granulation Quality: Red, Hyper-granulation N/A N/A Necrotic Amount: None Present (0%) N/A N/A Exposed Structures: Fat Layer (Subcutaneous N/A N/A Tissue) Exposed: Yes Fascia: No Tendon: No Muscle: No Joint: No Bone: No Epithelialization: Medium (34-66%) N/A N/A Periwound Skin Texture: Excoriation: Yes N/A N/A Induration: No Callus: No Crepitus: No Rash: No Scarring: No Periwound Skin Maceration: No N/A N/A Moisture: Dry/Scaly: No Periwound Skin Color: Ecchymosis: Yes N/A N/A Atrophie Blanche: No Cyanosis: No Erythema: No Hemosiderin Staining:  No Mottled: No Pallor: No Rubor: No Temperature: No Abnormality N/A N/A Tenderness on Yes N/A N/A Palpation: Wound Preparation: Ulcer Cleansing: N/A N/A Rinsed/Irrigated with Saline Topical Anesthetic Applied: None Treatment Notes Wound #1 (Left, Midline Lower Leg) 1. Cleansed with: Clean wound with Normal Saline 5. Secondary Dressing Applied Bordered Foam Dressing Notes CLENNON, NASCA ([id number]) Electronic Signature(s) Signed: 05/19/2017 5:01:41 PM By: Linton Ham MD Entered By: Linton Ham on 05/19/2017 09:39:27 Benjamin Soto ([id number]) -------------------------------------------------------------------------------- Multi-Disciplinary Care Plan Details Patient Name: Benjamin Soto Date of Service: 05/19/2017 9:15 AM Medical Record Number: [id number] Patient Account Number: [id number] Date of Birth/Sex: 1935/11/23 (81 y.o. Male) Treating RN: Benjamin Soto Primary Care Jurell Basista: Benjamin Soto Other Clinician: Referring Jaquel Glassburn: Benjamin Soto Treating Maelys Kinnick/Extender: Benjamin Soto in Treatment: 14 Active Inactive ` Abuse / Safety / Falls / Self Care Management Nursing Diagnoses: Potential for falls Goals: Patient will not experience any injury related to falls Date Initiated: 02/09/2017 Target Resolution Date: 04/23/2017 Goal Status: Active Interventions: Assess fall risk on admission and as needed Notes: ` Orientation to the Wound Care Program Nursing Diagnoses: Knowledge deficit related to the wound healing center program Goals: Patient/caregiver will verbalize understanding of the [facility] Program Date Initiated: 02/09/2017 Target Resolution Date: 04/23/2017 Goal Status: Active Interventions: Provide education on orientation to the wound center Notes: ` Wound/Skin Impairment Nursing Diagnoses: Impaired tissue integrity CORION, SHERROD ([id number]) Goals: Patient/caregiver will verbalize understanding of skin  care regimen Date Initiated: 02/09/2017 Target Resolution Date: 04/23/2017 Goal Status: Active Ulcer/skin breakdown will have a volume reduction of 30% by week 4 Date Initiated: 02/09/2017 Target Resolution Date: 04/23/2017 Goal Status: Active Ulcer/skin breakdown will have a volume reduction of 50% by week 8 Date Initiated: 02/09/2017 Target Resolution Date: 04/23/2017 Goal Status: Active Ulcer/skin breakdown will have a volume reduction of 80% by week 12 Date Initiated: 02/09/2017 Target Resolution Date: 04/23/2017 Goal Status: Active Ulcer/skin breakdown will heal within 14 weeks Date Initiated: 02/09/2017 Target Resolution Date: 04/23/2017 Goal Status: Active Interventions: Assess patient/caregiver ability to obtain necessary supplies Assess patient/caregiver ability to perform ulcer/skin care regimen upon admission and as needed Assess ulceration(s) every visit Notes: Electronic Signature(s) Signed: 05/19/2017 5:49:32 PM By: Benjamin Soto, BSN, RN, CWS, Kim RN, BSN Entered By: Benjamin Soto, BSN, RN, CWS, Kim on 05/19/2017 24:40:10 Benjamin Soto ([id number]) -------------------------------------------------------------------------------- Pain Assessment Details Patient Name:  Benjamin Soto Date of Service: 05/19/2017 9:15 AM Medical Record Number: [id number] Patient Account Number: [id number] Date of Birth/Sex: Sep 26, 1935 (81 y.o. Male) Treating RN: Benjamin Soto Primary Care Gotham Raden: Benjamin Soto Other Clinician: Referring Jeremie Giangrande: Benjamin Soto Treating Larron Armor/Extender: Benjamin Soto in Treatment: 14 Active Problems Location of Pain Severity and Description of Pain Patient Has Paino No Site Locations With Dressing Change: No Pain Management and Medication Current Pain Management: Goals for Pain Management Topical or injectable lidocaine is offered to patient for acute pain when surgical debridement is performed. If needed, Patient is instructed to use over the counter pain  medication for the following 24-48 hours after debridement. Wound care MDs do not prescribed pain medications. Patient has chronic pain or uncontrolled pain. Patient has been instructed to make an appointment with their Primary Care Physician for pain management. Electronic Signature(s) Signed: 05/19/2017 5:49:32 PM By: Benjamin Soto, BSN, RN, CWS, Kim RN, BSN Entered By: Benjamin Soto, BSN, RN, CWS, Kim on 05/19/2017 09:12:55 Benjamin Soto ([id number]) -------------------------------------------------------------------------------- Patient/Caregiver Education Details Patient Name: Benjamin Soto Date of Service: 05/19/2017 9:15 AM Medical Record Number: [id number] Patient Account Number: [id number] Date of Birth/Gender: [birth year] (81 y.o. Male) Treating RN: Benjamin Soto Primary Care Physician: Benjamin Soto Other Clinician: Referring Physician: Elsie Soto Treating Physician/Extender: Benjamin Soto in Treatment: 14 Education Assessment Education Provided To: Patient Education Topics Provided Wound/Skin Impairment: Handouts: Caring for Your Ulcer Methods: Demonstration Responses: State content correctly Electronic Signature(s) Signed: 05/19/2017 5:49:32 PM By: Benjamin Soto, BSN, RN, CWS, Kim RN, BSN Entered By: Benjamin Soto, BSN, RN, CWS, Kim on 05/19/2017 09:36:08 Benjamin Soto ([id number]) -------------------------------------------------------------------------------- Wound Assessment Details Patient Name: Benjamin Soto Date of Service: 05/19/2017 9:15 AM Medical Record Number: [id number] Patient Account Number: [id number] Date of Birth/Sex: 1936/09/01 (81 y.o. Male) Treating RN: Benjamin Soto Primary Care Kabrina Christiano: Benjamin Soto Other Clinician: Referring Sissi Padia: Benjamin Soto Treating Nisreen Guise/Extender: Ricard Dillon Weeks in Treatment: 14 Wound Status Wound Number: 1 Primary Trauma, Other Etiology: Wound Location: Left Lower Leg - Midline Wound Open Wounding Event:  Trauma Status: Date Acquired: 01/23/2017 Comorbid Glaucoma, Chronic Obstructive Weeks Of Treatment: 14 History: Pulmonary Disease (COPD), Arrhythmia, Clustered Wound: No Coronary Artery Disease, Hypertension, Type II Diabetes, Osteoarthritis, Neuropathy Photos Wound Measurements Length: (cm) 2 Width: (cm) 0.5 Depth: (cm) 0.1 Area: (cm) 0.785 Volume: (cm) 0.079 % Reduction in Area: 84.4% % Reduction in Volume: 84.3% Epithelialization: Medium (34-66%) Tunneling: No Undermining: No Wound Description Full Thickness Without Exposed Classification: Support Structures Wound Margin: Flat and Intact Exudate Medium Amount: Exudate Type: Serosanguineous Exudate Color: red, brown Foul Odor After Cleansing: No Slough/Fibrino No Wound Bed Granulation Amount: Large (67-100%) Exposed Structure Granulation Quality: Red, Hyper-granulation Fascia Exposed: No Necrotic Amount: None Present (0%) Fat Layer (Subcutaneous Tissue) Exposed: Yes Macwilliams, Fredi ([id number]) Tendon Exposed: No Muscle Exposed: No Joint Exposed: No Bone Exposed: No Periwound Skin Texture Texture Color No Abnormalities Noted: No No Abnormalities Noted: No Callus: No Atrophie Blanche: No Crepitus: No Cyanosis: No Excoriation: Yes Ecchymosis: Yes Induration: No Erythema: No Rash: No Hemosiderin Staining: No Scarring: No Mottled: No Pallor: No Moisture Rubor: No No Abnormalities Noted: No Dry / Scaly: No Temperature / Pain Maceration: No Temperature: No Abnormality Tenderness on Palpation: Yes Wound Preparation Ulcer Cleansing: Rinsed/Irrigated with Saline Topical Anesthetic Applied: None Treatment Notes Wound #1 (Left, Midline Lower Leg) 1. Cleansed with: Clean wound with Normal Saline 5. Secondary Dressing Applied Bordered Foam Dressing Notes [employer] Electronic Signature(s) Signed: 05/19/2017 5:49:32 PM By: Benjamin Soto, BSN, RN, CWS, Kim RN, BSN Entered  By: Benjamin Soto, BSN, RN, CWS, Kim on  05/19/2017 09:18:00 Benjamin Soto ([id number]) -------------------------------------------------------------------------------- [place] Details Patient Name: Benjamin Soto Date of Service: 05/19/2017 9:15 AM Medical Record Number: [id number] Patient Account Number: [id number] Date of Birth/Sex: 10-24-1935 (81 y.o. Male) Treating RN: Benjamin Soto Primary Care Lachrista Heslin: Benjamin Soto Other Clinician: Referring Quavon Keisling: Benjamin Soto Treating Khalik Pewitt/Extender: Benjamin Soto in Treatment: 14 Vital Signs Time Taken: 09:12 Temperature (F): 97.6 Height (in): 69 Pulse (bpm): 80 Weight (lbs): 185 Respiratory Rate (breaths/min): 16 Body Mass Index (BMI): 27.3 Blood Pressure (mmHg): 127/64 Reference Range: 80 - 120 mg / dl Electronic Signature(s) Signed: 05/19/2017 5:49:32 PM By: Benjamin Soto, BSN, RN, CWS, Kim RN, BSN Entered By: Benjamin Soto, BSN, RN, CWS, Kim on 05/19/2017 09:13:15

## 2017-05-21 NOTE — Progress Notes (Signed)
Benjamin, Soto ([id number]) Visit Report for 05/19/2017 HPI Details Patient Name: Benjamin Soto, RIECKE Date of Service: 05/19/2017 9:15 AM Medical Record Number: [id number] Patient Account Number: [id number] Date of Birth/Sex: [birth year] (81 y.o. Male) Treating RN: Cornell Barman Primary Care Provider: Elsie Stain Other Clinician: Referring Provider: Elsie Stain Treating Provider/Extender: Ricard Dillon Weeks in Treatment: 14 History of Present Illness HPI Description: 02/09/17; 81 year old man with a hx of type 2 DM, smoker. He suffered a fall 17 days ago while working on the [employer] of his trailer. He didn't realized anything had happed until he returned home. Has been applying polysporin. Received Cephalexin finishes on Saturday. No prior wound history. ABI's 0.96 on the left. No known hx of PAD. Hx of Afib on Coumadin,chf,st 2 crf 02/16/17; patient arrives today with the condition of his wound improved he is on silver alginate. Culture of the divot medially last week grew 2 different types of enterococcus including Enterococcus faecalis. He is on Augmentin. Surface of the wound looks better. Still debridement required today 02/23/17; traumatic wound on the left anterior leg. He has finished Augmentin. He found debridement last week painful fortunately none was required today 03/02/17; traumatic wound on the left anterior leg. Still 75% necrotic surface material requiring debridement. Most of the rest that it is stable 03/09/17; initially traumatic wound on his left leg. Arrives today with complaints of more pain and more erythema around the wound. States the pain is severe enough that it keeps him awake at night. The original wound surface itself looks good and the divot that was laterally has closed over. We switched him to [facility] last week. Wife shows me a lot of drainage coming out of the wound surface 03/16/17; the patient comes in today with a better-looking wound circumference no  erythema. He is completed the doxycycline I gave him. He still complains of a lot of pain which he states radiates into the foot and keeps him awake at night. It seems that the original injury happened when he fell backwards and the anterior leg came up against part of a trailer hitch is the best I'm able to determine. He also hurt his lower coccyx. We have been using silver alginate since last week largely because of the infection 03/23/17; no major change in this man's wounds. He has leaking edema fluid. Surface of the wound looks better we have been using silver alginate. Still complaining of a lot of discomfort 04/28/17 on evaluation today patient's left lower extremity wound on the interior shin appears to be doing extremely well. There is slight hyper granulation but overall he has been tolerating the dressing changes without complication. There is no report of fevers, chills, nausea, vomiting, or diarrhea. Patient has minimal discomfort at most a one out of 10 and that's mainly with cleansing and debridement week by week that he knows this. 03/31/17 on evaluation today patient appears to be doing worse unfortunately. His wound appears to be infected and is more tender to palpation. Nothing has changed other than he has discontinued his antibiotic at this point. 04/07/17; patient continues to decline with erythema around the wound. Culture last week did not significantly culture and organism. There was no staph aureus no group A strep although the Gram stain showed abundant gram-negative rods. The only culture we did when he initially came in here showed both enterococcus and Enterobacter. I gave him a course of Augmentin and things really seemed to settle down although I don't know that the Enterobacter  would've been treated well. He still complains of a lot of pain. He a lot of complaints about the wrap KEYMANI, MCLEAN ([id number]) 04/14/17; the patient has a better-looking wound today however  he has less edema control now that we are not putting him under compression. X-ray I did last week was negative. Culture and sensitivity negative. No need for further antibiotics 04/21/17; the patient arrives today with a better-looking wound. Healthy granulation and reduction in the width of the wound. He did not do well with Hydrofera Blue we have been using Endo form 05/05/17 on evaluation today patient's left lower some the wound appears to be doing very well. We have been using the Endoform and noting excellent progress. Fortunately patient states that he has not been having any significant pain and is also likewise pleased with how things have progressed. This has done much better than the [facility] Dressing. No fevers, chills, nausea, or vomiting noted at this time. 05/12/17; nice progression healthy granulation with advancing epithelialization using Endo form 05/19/17; really a nice progress since last week. Wound is more than 50% improved in terms of dimensions. Progressive healthy epithelialization we have been using Endoform Electronic Signature(s) Signed: 05/19/2017 5:01:41 PM By: Linton Ham MD Entered By: Linton Ham on 05/19/2017 09:40:05 Benjamin Soto ([id number]) -------------------------------------------------------------------------------- Physical Exam Details Patient Name: Benjamin Soto Date of Service: 05/19/2017 9:15 AM Medical Record Number: [id number] Patient Account Number: [id number] Date of Birth/Sex: 1936-03-08 (81 y.o. Male) Treating RN: Cornell Barman Primary Care Provider: Elsie Stain Other Clinician: Referring Provider: Elsie Stain Treating Provider/Extender: Ricard Dillon Weeks in Treatment: 14 Constitutional Sitting or standing Blood Pressure is within target range for patient.. Pulse regular and within target range for patient.Marland Kitchen Respirations regular, non-labored and within target range.. Temperature is normal and within the target range  for the patient.Marland Kitchen appears in no distress. Eyes Conjunctivae clear. No discharge. Respiratory Respiratory effort is easy and symmetric bilaterally. Rate is normal at rest and on room air.. Cardiovascular Pedal pulses palpable and strong bilaterally.. Lymphatic None palpable in the popliteal or inguinal area. Psychiatric No evidence of depression, anxiety, or agitation. Calm, cooperative, and communicative. Appropriate interactions and affect.. Notes Wound exam; nice healthy advancing epithelialization. The remaining dimension of this wound is greater than 50% resolved since last week. This may be fully closed by next week. Originally a traumatic wound. There is no evidence of ischemia no evidence of infection Electronic Signature(s) Signed: 05/19/2017 5:01:41 PM By: Linton Ham MD Entered By: Linton Ham on 05/19/2017 09:41:18 Benjamin Soto ([id number]) -------------------------------------------------------------------------------- Physician Orders Details Patient Name: Benjamin Soto Date of Service: 05/19/2017 9:15 AM Medical Record Number: [id number] Patient Account Number: [id number] Date of Birth/Sex: [birth year] (81 y.o. Male) Treating RN: Cornell Barman Primary Care Provider: Elsie Stain Other Clinician: Referring Provider: Elsie Stain Treating Provider/Extender: Tito Dine in Treatment: 14 Verbal / Phone Orders: No Diagnosis Coding Wound Cleansing Wound #1 Left,Midline Lower Leg o Clean wound with Normal Saline. o Cleanse wound with mild soap and water Anesthetic Wound #1 Left,Midline Lower Leg o Topical Lidocaine 4% cream applied to wound bed prior to debridement Primary Wound Dressing Wound #1 Left,Midline Lower Leg o Other: - Endoform Secondary Dressing Wound #1 Left,Midline Lower Leg o Boardered Foam Dressing Dressing Change Frequency Wound #1 Left,Midline Lower Leg o Other: - Friday and Monday Follow-up Appointments Wound  #1 Left,Midline Lower Leg o Return Appointment in 1 week. Edema Control Wound #1 Left,Midline Lower Leg o Elevate legs to the level of the heart  and pump ankles as often as possible Additional Orders / Instructions Wound #1 Left,Midline Lower Leg o Increase protein intake. o Other: - Please add vitamin A, vitamin C and zinc supplements to your diet MADS, BORGMEYER ([id number]) Electronic Signature(s) Signed: 05/19/2017 5:01:41 PM By: Linton Ham MD Signed: 05/19/2017 5:49:32 PM By: Gretta Cool, BSN, RN, CWS, Kim RN, BSN Entered By: Gretta Cool, BSN, RN, CWS, Kim on 05/19/2017 09:29:51 Benjamin Soto ([id number]) -------------------------------------------------------------------------------- Problem List Details Patient Name: Benjamin Soto Date of Service: 05/19/2017 9:15 AM Medical Record Number: [id number] Patient Account Number: [id number] Date of Birth/Sex: [birth year] (81 y.o. 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 Male) Treating RN: Cornell Barman Primary Care Provider:  Elsie Stain Other Clinician: Referring Provider: Elsie Stain Treating Provider/Extender: Ricard Dillon Weeks in Treatment: 14 Subjective History of Present Illness (HPI) 02/09/17; 81 year old man with a hx of type 2 DM, smoker. He suffered a fall 17 days ago while working on the [employer] of his trailer. He didn't realized anything had happed until he returned home. Has been applying polysporin. Received Cephalexin finishes on Saturday. No prior wound history. ABI's 0.96 on the left. No known hx of PAD. Hx of Afib on Coumadin,chf,st 2 crf 02/16/17; patient arrives today with the condition of his wound improved he is on silver alginate. Culture of the divot medially last week grew 2 different types of enterococcus including Enterococcus faecalis. He is on Augmentin. Surface of the wound looks better. Still debridement required today 02/23/17; traumatic wound on the left anterior leg. He has finished Augmentin. He found debridement last week painful fortunately none was required today 03/02/17; traumatic wound on the left anterior leg. Still 75% necrotic surface material requiring debridement. Most of the rest that it is stable 03/09/17; initially traumatic wound on his left leg. Arrives today with complaints of more pain and more erythema around the wound. States the pain is severe enough that it keeps him awake at night. The original wound surface itself looks good and the divot that was laterally has closed over. We switched him to [facility] last week. Wife shows me a lot of drainage coming out of the wound surface 03/16/17; the patient comes in today with a better-looking wound circumference no erythema. He is completed the doxycycline I gave him. He still complains of a lot of pain which he states radiates into the foot and keeps him awake at night. It seems that the original injury happened when he fell backwards and the anterior leg came up against part of a trailer hitch is the  best I'm able to determine. He also hurt his lower coccyx. We have been using silver alginate since last week largely because of the infection 03/23/17; no major change in this man's wounds. He has leaking edema fluid. Surface of the wound looks better we have been using silver  alginate. Still complaining of a lot of discomfort 04/28/17 on evaluation today patient's left lower extremity wound on the interior shin appears to be doing extremely well. There is slight hyper granulation but overall he has been tolerating the dressing changes without complication. There is no report of fevers, chills, nausea, vomiting, or diarrhea. Patient has minimal discomfort at most a one out of 10 and that's mainly with cleansing and debridement week by week that he knows this. 03/31/17 on evaluation today patient appears to be doing worse unfortunately. His wound appears to be infected and is more tender to palpation. Nothing has changed other than he has discontinued his antibiotic at this point. 04/07/17; patient continues to decline with erythema around the wound. Culture last week did not significantly culture and organism. There was no staph aureus no group A strep although the Gram stain showed abundant gram-negative rods. The only culture we did when he initially came in here showed both enterococcus and Enterobacter. I gave him a course of Augmentin and things really seemed to settle down although I don't know that the Enterobacter would've been treated well. He still complains of a lot of pain. He a lot of complaints about the wrap 04/14/17; the patient has a better-looking wound today however he has less edema control now that we are not putting him under compression. X-ray I did last week was negative. Culture and sensitivity negative. No LAMBROS, CERRO ([id number]) need for further antibiotics 04/21/17; the patient arrives today with a better-looking wound. Healthy granulation and reduction in the width of  the wound. He did not do well with Hydrofera Blue we have been using Endo form 05/05/17 on evaluation today patient's left lower some the wound appears to be doing very well. We have been using the Endoform and noting excellent progress. Fortunately patient states that he has not been having any significant pain and is also likewise pleased with how things have progressed. This has done much better than the [facility] Dressing. No fevers, chills, nausea, or vomiting noted at this time. 05/12/17; nice progression healthy granulation with advancing epithelialization using Endo form 05/19/17; really a nice progress since last week. Wound is more than 50% improved in terms of dimensions. Progressive healthy epithelialization we have been using Endoform Objective Constitutional Sitting or standing Blood Pressure is within target range for patient.. Pulse regular and within target range for patient.Marland Kitchen Respirations regular, non-labored and within target range.. Temperature is normal and within the target range for the patient.Marland Kitchen appears in no distress. Vitals Time Taken: 9:12 AM, Height: 69 in, Weight: 185 lbs, BMI: 27.3, Temperature: 97.6 F, Pulse: 80 bpm, Respiratory Rate: 16 breaths/min, Blood Pressure: 127/64 mmHg. Eyes Conjunctivae clear. No discharge. Respiratory Respiratory effort is easy and symmetric bilaterally. Rate is normal at rest and on room air.. Cardiovascular Pedal pulses palpable and strong bilaterally.. Lymphatic None palpable in the popliteal or inguinal area. Psychiatric No evidence of depression, anxiety, or agitation. Calm, cooperative, and communicative. Appropriate interactions and affect.. General Notes: Wound exam; nice healthy advancing epithelialization. The remaining dimension of this wound is greater than 50% resolved since last week. This may be fully closed by next week. Originally a traumatic wound. There is no evidence of ischemia no evidence of  infection Gallentine, Hallie ([id number]) Integumentary (Hair, Skin) Wound #1 status is Open. Original cause of wound was Trauma. The wound is located on the Left,Midline Lower Leg. The wound measures 2cm length x 0.5cm width x 0.1cm depth; 0.785cm^2 area and 0.079cm^3 volume.  There is Fat Layer (Subcutaneous Tissue) Exposed exposed. There is no tunneling or undermining noted. There is a medium amount of serosanguineous drainage noted. The wound margin is flat and intact. There is large (67-100%) red, hyper - granulation within the wound bed. There is no necrotic tissue within the wound bed. The periwound skin appearance exhibited: Excoriation, Ecchymosis. The periwound skin appearance did not exhibit: Callus, Crepitus, Induration, Rash, Scarring, Dry/Scaly, Maceration, Atrophie Blanche, Cyanosis, Hemosiderin Staining, Mottled, Pallor, Rubor, Erythema. Periwound temperature was noted as No Abnormality. The periwound has tenderness on palpation. 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 Cleanse wound with mild soap and water Anesthetic: Wound #1 Left,Midline Lower Leg: Topical Lidocaine 4% cream applied to wound bed prior to debridement Primary Wound Dressing: Wound #1 Left,Midline Lower Leg: Other: - Endoform Secondary Dressing: Wound #1 Left,Midline Lower Leg: Boardered Foam Dressing Dressing Change Frequency: Wound #1 Left,Midline Lower Leg: Other: - Friday and Monday Follow-up Appointments: Wound #1 Left,Midline Lower Leg: Return Appointment in 1 week. KEEON, ZURN ([id number]) Edema Control: Wound #1 Left,Midline Lower Leg: Elevate legs to the level of the heart and pump ankles as often as  possible Additional Orders / Instructions: Wound #1 Left,Midline Lower Leg: Increase protein intake. Other: - Please add vitamin A, vitamin C and zinc supplements to your diet #1 at this point continue Endoform, border foam cover. Patient is changing this every second day. #2 may actually be closed by next week, this was originally a significant traumatic wound I don't believe there'll be any secondary prevention issues here Electronic Signature(s) Signed: 05/19/2017 5:01:41 PM By: Linton Ham MD Entered By: Linton Ham on 05/19/2017 09:43:22 Benjamin Soto ([id number]) -------------------------------------------------------------------------------- [place] Details Patient Name: Benjamin Soto Date of Service: 05/19/2017 Medical Record Number: [id number] Patient Account Number: [id number] Date of Birth/Sex: December 31, 1935 (81 y.o. 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

## 2017-05-25 ENCOUNTER — Ambulatory Visit (INDEPENDENT_AMBULATORY_CARE_PROVIDER_SITE_OTHER): Payer: Medicare Other

## 2017-05-25 DIAGNOSIS — Z5181 Encounter for therapeutic drug level monitoring: Secondary | ICD-10-CM | POA: Diagnosis not present

## 2017-05-25 LAB — POCT INR: INR: 2.1

## 2017-05-25 NOTE — Patient Instructions (Signed)
Pre visit review using our clinic review tool, if applicable. No additional management support is needed unless otherwise documented below in the visit note. 

## 2017-05-26 ENCOUNTER — Encounter: Payer: Medicare Other | Attending: Internal Medicine | Admitting: Internal Medicine

## 2017-05-26 DIAGNOSIS — L03116 Cellulitis of left lower limb: Secondary | ICD-10-CM | POA: Diagnosis not present

## 2017-05-26 DIAGNOSIS — I509 Heart failure, unspecified: Secondary | ICD-10-CM | POA: Diagnosis not present

## 2017-05-26 DIAGNOSIS — Z87891 Personal history of nicotine dependence: Secondary | ICD-10-CM | POA: Insufficient documentation

## 2017-05-26 DIAGNOSIS — I2510 Atherosclerotic heart disease of native coronary artery without angina pectoris: Secondary | ICD-10-CM | POA: Diagnosis not present

## 2017-05-26 DIAGNOSIS — Z7901 Long term (current) use of anticoagulants: Secondary | ICD-10-CM | POA: Diagnosis not present

## 2017-05-26 DIAGNOSIS — L97223 Non-pressure chronic ulcer of left calf with necrosis of muscle: Secondary | ICD-10-CM | POA: Diagnosis not present

## 2017-05-26 DIAGNOSIS — M1990 Unspecified osteoarthritis, unspecified site: Secondary | ICD-10-CM | POA: Insufficient documentation

## 2017-05-26 DIAGNOSIS — I4891 Unspecified atrial fibrillation: Secondary | ICD-10-CM | POA: Insufficient documentation

## 2017-05-26 DIAGNOSIS — S81812D Laceration without foreign body, left lower leg, subsequent encounter: Secondary | ICD-10-CM | POA: Insufficient documentation

## 2017-05-26 DIAGNOSIS — S81802A Unspecified open wound, left lower leg, initial encounter: Secondary | ICD-10-CM | POA: Diagnosis not present

## 2017-05-26 DIAGNOSIS — E1122 Type 2 diabetes mellitus with diabetic chronic kidney disease: Secondary | ICD-10-CM | POA: Insufficient documentation

## 2017-05-26 DIAGNOSIS — I130 Hypertensive heart and chronic kidney disease with heart failure and stage 1 through stage 4 chronic kidney disease, or unspecified chronic kidney disease: Secondary | ICD-10-CM | POA: Insufficient documentation

## 2017-05-26 DIAGNOSIS — J449 Chronic obstructive pulmonary disease, unspecified: Secondary | ICD-10-CM | POA: Insufficient documentation

## 2017-05-26 DIAGNOSIS — N189 Chronic kidney disease, unspecified: Secondary | ICD-10-CM | POA: Diagnosis not present

## 2017-05-26 DIAGNOSIS — E1140 Type 2 diabetes mellitus with diabetic neuropathy, unspecified: Secondary | ICD-10-CM | POA: Diagnosis not present

## 2017-05-26 DIAGNOSIS — W19XXXD Unspecified fall, subsequent encounter: Secondary | ICD-10-CM | POA: Insufficient documentation

## 2017-05-26 NOTE — Progress Notes (Signed)
Agreed, thanks

## 2017-05-28 ENCOUNTER — Telehealth: Payer: Self-pay

## 2017-05-28 NOTE — Telephone Encounter (Signed)
Spoke with [facility] in [place] last night.  Per Izora Gala Orthoptist), patient will not need to hold his Coumadin for his cataract removal on Monday.  Patient contacted today and made aware by this writer.

## 2017-05-28 NOTE — Progress Notes (Signed)
PRECILIANO, CASTELL ([id number]) Visit Report for 05/26/2017 HPI Details Patient Name: Benjamin Soto, Benjamin Soto Date of Service: 05/26/2017 9:15 AM Medical Record Number: [id number] Patient Account Number: [id number] Date of Birth/Sex: January 01, 1936 (81 y.o. Male) Treating RN: Cornell Barman Primary Care Provider: Elsie Stain Other Clinician: Referring Provider: Elsie Stain Treating Provider/Extender: Ricard Dillon Weeks in Treatment: 15 History of Present Illness HPI Description: 02/09/17; 81 year old man with a hx of type 2 DM, smoker. He suffered a fall 17 days ago while working on the [employer] of his trailer. He didn't realized anything had happed until he returned home. Has been applying polysporin. Received Cephalexin finishes on Saturday. No prior wound history. ABI's 0.96 on the left. No known hx of PAD. Hx of Afib on Coumadin,chf,st 2 crf 02/16/17; patient arrives today with the condition of his wound improved he is on silver alginate. Culture of the divot medially last week grew 2 different types of enterococcus including Enterococcus faecalis. He is on Augmentin. Surface of the wound looks better. Still debridement required today 02/23/17; traumatic wound on the left anterior leg. He has finished Augmentin. He found debridement last week painful fortunately none was required today 03/02/17; traumatic wound on the left anterior leg. Still 75% necrotic surface material requiring debridement. Most of the rest that it is stable 03/09/17; initially traumatic wound on his left leg. Arrives today with complaints of more pain and more erythema around the wound. States the pain is severe enough that it keeps him awake at night. The original wound surface itself looks good and the divot that was laterally has closed over. We switched him to [facility] last week. Wife shows me a lot of drainage coming out of the wound surface 03/16/17; the patient comes in today with a better-looking wound circumference no  erythema. He is completed the doxycycline I gave him. He still complains of a lot of pain which he states radiates into the foot and keeps him awake at night. It seems that the original injury happened when he fell backwards and the anterior leg came up against part of a trailer hitch is the best I'm able to determine. He also hurt his lower coccyx. We have been using silver alginate since last week largely because of the infection 03/23/17; no major change in this man's wounds. He has leaking edema fluid. Surface of the wound looks better we have been using silver alginate. Still complaining of a lot of discomfort 04/28/17 on evaluation today patient's left lower extremity wound on the interior shin appears to be doing extremely well. There is slight hyper granulation but overall he has been tolerating the dressing changes without complication. There is no report of fevers, chills, nausea, vomiting, or diarrhea. Patient has minimal discomfort at most a one out of 10 and that's mainly with cleansing and debridement week by week that he knows this. 03/31/17 on evaluation today patient appears to be doing worse unfortunately. His wound appears to be infected and is more tender to palpation. Nothing has changed other than he has discontinued his antibiotic at this point. 04/07/17; patient continues to decline with erythema around the wound. Culture last week did not significantly culture and organism. There was no staph aureus no group A strep although the Gram stain showed abundant gram-negative rods. The only culture we did when he initially came in here showed both enterococcus and Enterobacter. I gave him a course of Augmentin and things really seemed to settle down although I don't know that the Enterobacter  would've been treated well. He still complains of a lot of pain. He a lot of complaints about the wrap SULEYMAN, EHRMAN ([id number]) 04/14/17; the patient has a better-looking wound today however  he has less edema control now that we are not putting him under compression. X-ray I did last week was negative. Culture and sensitivity negative. No need for further antibiotics 04/21/17; the patient arrives today with a better-looking wound. Healthy granulation and reduction in the width of the wound. He did not do well with Hydrofera Blue we have been using Endo form 05/05/17 on evaluation today patient's left lower some the wound appears to be doing very well. We have been using the Endoform and noting excellent progress. Fortunately patient states that he has not been having any significant pain and is also likewise pleased with how things have progressed. This has done much better than the [facility] Dressing. No fevers, chills, nausea, or vomiting noted at this time. 05/12/17; nice progression healthy granulation with advancing epithelialization using Endo form 05/19/17; really a nice progress since last week. Wound is more than 50% improved in terms of dimensions. Progressive healthy epithelialization we have been using Endoform 05/26/17; continued nice progress, the wound on his left leg is almost closed. He has one remaining open area from the original wound and probably one area that was caused by removing some adherent Endoform. This is small and at the base of the wound. Originally this was traumatic which became secondarily infected. I think there was also a subcutaneous abscess medially and this wound with a culture growing both enterococcus and Enterobacter. He had a prolonged course of Augmentin Electronic Signature(s) Signed: 05/26/2017 5:15:21 PM By: Linton Ham MD Entered By: Linton Ham on 05/26/2017 09:45:42 Benjamin Soto ([id number]) -------------------------------------------------------------------------------- Physical Exam Details Patient Name: Benjamin Soto Date of Service: 05/26/2017 9:15 AM Medical Record Number: [id number] Patient Account Number:  [id number] Date of Birth/Sex: [birth year] (81 y.o. Male) Treating RN: Cornell Barman Primary Care Provider: Elsie Stain Other Clinician: Referring Provider: Elsie Stain Treating Provider/Extender: Ricard Dillon Weeks in Treatment: 15 Constitutional Sitting or standing Blood Pressure is within target range for patient.. Pulse regular and within target range for patient.Marland Kitchen Respirations regular, non-labored and within target range.. Temperature is normal and within the target range for the patient.Marland Kitchen appears in no distress. Cardiovascular Pedal pulses palpable. Notes Wound exam; very tiny open area of the original difficult wound remains. No evidence of surrounding infection. Electronic Signature(s) Signed: 05/26/2017 5:15:21 PM By: Linton Ham MD Entered By: Linton Ham on 05/26/2017 09:46:47 Benjamin Soto ([id number]) -------------------------------------------------------------------------------- Physician Orders Details Patient Name: Benjamin Soto Date of Service: 05/26/2017 9:15 AM Medical Record Number: [id number] Patient Account Number: [id number] Date of Birth/Sex: 11/12/1935 (81 y.o. Male) Treating RN: Cornell Barman Primary Care Provider: Elsie Stain Other Clinician: Referring Provider: Elsie Stain Treating Provider/Extender: Tito Dine in Treatment: 15 Verbal / Phone Orders: No Diagnosis Coding Wound Cleansing Wound #1 Left,Midline Lower Leg o Clean wound with Normal Saline. o Cleanse wound with mild soap and water Anesthetic Wound #1 Left,Midline Lower Leg o Topical Lidocaine 4% cream applied to wound bed prior to debridement Primary Wound Dressing Wound #1 Left,Midline Lower Leg o Other: - Endoform Secondary Dressing Wound #1 Left,Midline Lower Leg o Boardered Foam Dressing Dressing Change Frequency Wound #1 Left,Midline Lower Leg o Other: - Friday and Monday Follow-up Appointments Wound #1 Left,Midline Lower Leg o  Return Appointment in 1 week. Edema Control Wound #1 Left,Midline Lower Leg o Elevate legs to  the level of the heart and pump ankles as often as possible Additional Orders / Instructions Wound #1 Left,Midline Lower Leg o Increase protein intake. o Other: - Please add vitamin A, vitamin C and zinc supplements to your diet KEITA, VALLEY ([id number]) Electronic Signature(s) Signed: 05/26/2017 5:15:21 PM By: Linton Ham MD Signed: 05/26/2017 5:52:49 PM By: Gretta Cool, BSN, RN, CWS, Kim RN, BSN Entered By: Gretta Cool, BSN, RN, CWS, Kim on 05/26/2017 09:35:17 Benjamin Soto ([id number]) -------------------------------------------------------------------------------- Problem List Details Patient Name: Benjamin Soto Date of Service: 05/26/2017 9:15 AM Medical Record Number: [id number] Patient Account Number: [id number] Date of Birth/Sex: 07/28/1936 (81 y.o. 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 Male) Treating RN: Cornell Barman Primary Care Provider: Elsie Stain Other  Clinician: Referring Provider: Elsie Stain Treating Provider/Extender: Ricard Dillon Weeks in Treatment: 15 Subjective History of Present Illness (HPI) 02/09/17; 81 year old man with a hx of type 2 DM, smoker. He suffered a fall 17 days ago while working on the [employer] of his trailer. He didn't realized anything had happed until he returned home. Has been applying polysporin. Received Cephalexin finishes on Saturday. No prior wound history. ABI's 0.96 on the left. No known hx of PAD. Hx of Afib on Coumadin,chf,st 2 crf 02/16/17; patient arrives today with the condition of his wound improved he is on silver alginate. Culture of the divot medially last week grew 2 different types of enterococcus including Enterococcus faecalis. He is on Augmentin. Surface of the wound looks better. Still debridement required today 02/23/17; traumatic wound on the left anterior leg. He has finished Augmentin. He found debridement last week painful fortunately none was required today 03/02/17; traumatic wound on the left anterior leg. Still 75% necrotic surface material requiring debridement. Most of the rest that it is stable 03/09/17; initially traumatic wound on his left leg. Arrives today with complaints of more pain and more erythema around the wound. States the pain is severe enough that it keeps him awake at night. The original wound surface itself looks good and the divot that was laterally has closed over. We switched him to [facility] last week. Wife shows me a lot of drainage coming out of the wound surface 03/16/17; the patient comes in today with a better-looking wound circumference no erythema. He is completed the doxycycline I gave him. He still complains of a lot of pain which he states radiates into the foot and keeps him awake at night. It seems that the original injury happened when he fell backwards and the anterior leg came up against part of a trailer hitch is the best I'm able to  determine. He also hurt his lower coccyx. We have been using silver alginate since last week largely because of the infection 03/23/17; no major change in this man's wounds. He has leaking edema fluid. Surface of the wound looks better  we have been using silver alginate. Still complaining of a lot of discomfort 04/28/17 on evaluation today patient's left lower extremity wound on the interior shin appears to be doing extremely well. There is slight hyper granulation but overall he has been tolerating the dressing changes without complication. There is no report of fevers, chills, nausea, vomiting, or diarrhea. Patient has minimal discomfort at most a one out of 10 and that's mainly with cleansing and debridement week by week that he knows this. 03/31/17 on evaluation today patient appears to be doing worse unfortunately. His wound appears to be infected and is more tender to palpation. Nothing has changed other than he has discontinued his antibiotic at this point. 04/07/17; patient continues to decline with erythema around the wound. Culture last week did not significantly culture and organism. There was no staph aureus no group A strep although the Gram stain showed abundant gram-negative rods. The only culture we did when he initially came in here showed both enterococcus and Enterobacter. I gave him a course of Augmentin and things really seemed to settle down although I don't know that the Enterobacter would've been treated well. He still complains of a lot of pain. He a lot of complaints about the wrap 04/14/17; the patient has a better-looking wound today however he has less edema control now that we are not putting him under compression. X-ray I did last week was negative. Culture and sensitivity negative. No GLENDAL, CASSADAY ([id number]) need for further antibiotics 04/21/17; the patient arrives today with a better-looking wound. Healthy granulation and reduction in the width of the wound. He did  not do well with Hydrofera Blue we have been using Endo form 05/05/17 on evaluation today patient's left lower some the wound appears to be doing very well. We have been using the Endoform and noting excellent progress. Fortunately patient states that he has not been having any significant pain and is also likewise pleased with how things have progressed. This has done much better than the [facility] Dressing. No fevers, chills, nausea, or vomiting noted at this time. 05/12/17; nice progression healthy granulation with advancing epithelialization using Endo form 05/19/17; really a nice progress since last week. Wound is more than 50% improved in terms of dimensions. Progressive healthy epithelialization we have been using Endoform 05/26/17; continued nice progress, the wound on his left leg is almost closed. He has one remaining open area from the original wound and probably one area that was caused by removing some adherent Endoform. This is small and at the base of the wound. Originally this was traumatic which became secondarily infected. I think there was also a subcutaneous abscess medially and this wound with a culture growing both enterococcus and Enterobacter. He had a prolonged course of Augmentin Objective Constitutional Sitting or standing Blood Pressure is within target range for patient.. Pulse regular and within target range for patient.Marland Kitchen Respirations regular, non-labored and within target range.. Temperature is normal and within the target range for the patient.Marland Kitchen appears in no distress. Vitals Time Taken: 9:16 AM, Height: 69 in, Weight: 185 lbs, BMI: 27.3, Temperature: 97.6 F, Pulse: 74 bpm, Respiratory Rate: 16 breaths/min, Blood Pressure: 116/64 mmHg. Cardiovascular Pedal pulses palpable. General Notes: Wound exam; very tiny open area of the original difficult wound remains. No evidence of surrounding infection. Integumentary (Hair, Skin) Wound #1 status is Open. Original  cause of wound was Trauma. The wound is located on the Left,Midline Lower Leg. The wound measures 1.1cm length x 0.3cm width x  0.1cm depth; 0.259cm^2 area and 0.026cm^3 volume. The wound is limited to skin breakdown. There is no tunneling or undermining noted. There is a medium amount of serosanguineous drainage noted. The wound margin is flat and intact. There is large (67-100%) red, hyper - granulation within the wound bed. There is no necrotic tissue within the wound bed. The periwound skin appearance did not exhibit: Callus, Crepitus, Excoriation, Induration, Rash, Scarring, Dry/Scaly, Maceration, Atrophie Blanche, Cyanosis, Ecchymosis, Hemosiderin Staining, Mottled, Espino, [place] ([id number]) Pallor, Rubor, Erythema. Periwound temperature was noted as No Abnormality. The periwound has tenderness on palpation. 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 Cleanse wound with mild soap and water Anesthetic: Wound #1 Left,Midline Lower Leg: Topical Lidocaine 4% cream applied to wound bed prior to debridement Primary Wound Dressing: Wound #1 Left,Midline Lower Leg: Other: - Endoform Secondary Dressing: Wound #1 Left,Midline Lower Leg: Boardered Foam Dressing Dressing Change Frequency: Wound #1 Left,Midline Lower Leg: Other: - Friday and Monday Follow-up Appointments: Wound #1 Left,Midline Lower Leg: Return Appointment in 1 week. Edema Control: Wound #1 Left,Midline Lower Leg: Elevate legs to the level of the heart and pump ankles as often as possible Additional Orders / Instructions: Wound #1 Left,Midline Lower Leg: Increase protein intake. Other: - Please add vitamin A, vitamin C and zinc supplements to your  diet ANTOLIN, BELSITO ([id number]) #1 we continued the same primary dressing which is Endoform #2 he is using a border foam dressing I believe with his own compression stockings #3 he should be healed next week Electronic Signature(s) Signed: 05/26/2017 5:15:21 PM By: Linton Ham MD Entered By: Linton Ham on 05/26/2017 09:47:31 Benjamin Soto ([id number]) -------------------------------------------------------------------------------- [place] Details Patient Name: Benjamin Soto Date of Service: 05/26/2017 Medical Record Number: [id number] Patient Account Number: [id number] Date of Birth/Sex: July 27, 1936 (81 y.o. 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

## 2017-05-28 NOTE — Progress Notes (Signed)
JORDYN, DOANE ([id number]) Visit Report for 05/26/2017 Arrival Information Details Patient Name: Benjamin Soto, Benjamin Soto Date of Service: 05/26/2017 9:15 AM Medical Record Number: [id number] Patient Account Number: [id number] Date of Birth/Sex: [birth year] (81 y.o. Male) Treating RN: Cornell Barman Primary Care Evy Lutterman: Elsie Stain Other Clinician: Referring April Colter: Elsie Stain Treating Zoii Florer/Extender: Tito Dine in Treatment: 15 Visit Information History Since Last Visit Added or deleted any medications: No Patient Arrived: Ambulatory Any new allergies or adverse reactions: No Arrival Time: 09:15 Had a fall or experienced change in No Accompanied By: wife activities of daily living that may affect Transfer Assistance: None risk of falls: Patient Identification Verified: Yes Signs or symptoms of abuse/neglect since last No Secondary Verification Process Yes visito Completed: Hospitalized since last visit: No Patient Requires Transmission- No Has Dressing in Place as Prescribed: Yes Based Precautions: Pain Present Now: No Patient Has Alerts: Yes Patient Alerts: Patient on Blood Thinner warfarin Type II Diabetic Electronic Signature(s) Signed: 05/26/2017 5:52:49 PM By: Gretta Cool, BSN, RN, CWS, Kim RN, BSN Entered By: Gretta Cool, BSN, RN, CWS, Kim on 05/26/2017 09:15:59 Benjamin Soto ([id number]) -------------------------------------------------------------------------------- Clinic Level of Care Assessment Details Patient Name: Benjamin Soto Date of Service: 05/26/2017 9:15 AM Medical Record Number: [id number] Patient Account Number: [id number] Date of Birth/Sex: October 06, 1935 (81 y.o. Male) Treating RN: Cornell Barman Primary Care Murriel Eidem: Elsie Stain Other Clinician: Referring Aicia Babinski: Elsie Stain Treating Nicolas Banh/Extender: Tito Dine in Treatment: 15 Clinic Level of Care Assessment Items TOOL 4 Quantity Score []  - Use when only an EandM is performed  on FOLLOW-UP visit 0 ASSESSMENTS - Nursing Assessment / Reassessment []  - Reassessment of Co-morbidities (includes updates in patient status) 0 X - Reassessment of Adherence to Treatment Plan 1 5 ASSESSMENTS - Wound and Skin Assessment / Reassessment X - Simple Wound Assessment / Reassessment - one wound 1 5 []  - Complex Wound Assessment / Reassessment - multiple wounds 0 []  - Dermatologic / Skin Assessment (not related to wound area) 0 ASSESSMENTS - Focused Assessment []  - Circumferential Edema Measurements - multi extremities 0 []  - Nutritional Assessment / Counseling / Intervention 0 []  - Lower Extremity Assessment (monofilament, tuning fork, pulses) 0 []  - Peripheral Arterial Disease Assessment (using hand held doppler) 0 ASSESSMENTS - Ostomy and/or Continence Assessment and Care []  - Incontinence Assessment and Management 0 []  - Ostomy Care Assessment and Management (repouching, etc.) 0 PROCESS - Coordination of Care X - Simple Patient / Family Education for ongoing care 1 15 []  - Complex (extensive) Patient / Family Education for ongoing care 0 []  - Staff obtains Programmer, systems, Records, Test Results / Process Orders 0 []  - Staff telephones HHA, Nursing Homes / Clarify orders / etc 0 []  - Routine Transfer to another Facility (non-emergent condition) 0 Benjamin Soto, Benjamin Soto ([id number]) []  - Routine Hospital Admission (non-emergent condition) 0 []  - New Admissions / Biomedical engineer / Ordering NPWT, Apligraf, etc. 0 []  - Emergency Hospital Admission (emergent condition) 0 X - Simple Discharge Coordination 1 10 []  - Complex (extensive) Discharge Coordination 0 PROCESS - Special Needs []  - Pediatric / Minor Patient Management 0 []  - Isolation Patient Management 0 []  - Hearing / Language / Visual special needs 0 []  - Assessment of Community assistance (transportation, D/C planning, etc.) 0 []  - Additional assistance / Altered mentation 0 []  - Support Surface(s) Assessment (bed,  cushion, seat, etc.) 0 INTERVENTIONS - Wound Cleansing / Measurement X - Simple Wound Cleansing - one wound 1 5 []  - Complex Wound Cleansing - multiple wounds 0  X - Wound Imaging (photographs - any number of wounds) 1 5 []  - Wound Tracing (instead of photographs) 0 X - Simple Wound Measurement - one wound 1 5 []  - Complex Wound Measurement - multiple wounds 0 INTERVENTIONS - Wound Dressings []  - Small Wound Dressing one or multiple wounds 0 X - Medium Wound Dressing one or multiple wounds 1 15 []  - Large Wound Dressing one or multiple wounds 0 []  - Application of Medications - topical 0 []  - Application of Medications - injection 0 INTERVENTIONS - Miscellaneous []  - External ear exam 0 Benjamin Soto, Benjamin Soto ([id number]) []  - Specimen Collection (cultures, biopsies, blood, body fluids, etc.) 0 []  - Specimen(s) / Culture(s) sent or taken to Lab for analysis 0 []  - Patient Transfer (multiple staff / Harrel Lemon Lift / Similar devices) 0 []  - Simple Staple / Suture removal (25 or less) 0 []  - Complex Staple / Suture removal (26 or more) 0 []  - Hypo / Hyperglycemic Management (close monitor of Blood Glucose) 0 []  - Ankle / Brachial Index (ABI) - do not check if billed separately 0 X - Vital Signs 1 5 Has the patient been seen at the hospital within the last three years: Yes Total Score: 70 Level Of Care: New/Established - Level 2 Electronic Signature(s) Signed: 05/26/2017 5:52:49 PM By: Gretta Cool, BSN, RN, CWS, Kim RN, BSN Entered By: Gretta Cool, BSN, RN, CWS, Kim on 05/26/2017 09:37:40 Benjamin Soto ([id number]) -------------------------------------------------------------------------------- Encounter Discharge Information Details Patient Name: Benjamin Soto Date of Service: 05/26/2017 9:15 AM Medical Record Number: [id number] Patient Account Number: [id number] Date of Birth/Sex: 11/13/1935 (81 y.o. Male) Treating RN: Cornell Barman Primary Care Amore Grater: Elsie Stain Other Clinician: Referring Amarea Macdowell:  Elsie Stain Treating Dawood Spitler/Extender: Tito Dine in Treatment: 15 Encounter Discharge Information Items Discharge Pain Level: 0 Discharge Condition: Stable Ambulatory Status: Ambulatory Discharge Destination: Home Transportation: Private Auto Accompanied By: wife Schedule Follow-up Appointment: Yes Medication Reconciliation completed and provided to Patient/Care Yes Aniza Shor: Provided on Clinical Summary of Care: 05/26/2017 Form Type Recipient Paper Patient HD Electronic Signature(s) Signed: 05/26/2017 5:52:49 PM By: Gretta Cool, BSN, RN, CWS, Kim RN, BSN Entered By: Gretta Cool, BSN, RN, CWS, Kim on 05/26/2017 09:38:32 Benjamin Soto ([id number]) -------------------------------------------------------------------------------- Lower Extremity Assessment Details Patient Name: Benjamin Soto Date of Service: 05/26/2017 9:15 AM Medical Record Number: [id number] Patient Account Number: [id number] Date of Birth/Sex: 07-19-1936 (81 y.o. Male) Treating RN: Cornell Barman Primary Care Teghan Philbin: Elsie Stain Other Clinician: Referring Devanny Palecek: Elsie Stain Treating Maven Varelas/Extender: Ricard Dillon Weeks in Treatment: 15 Vascular Assessment Pulses: Dorsalis Pedis Palpable: [Left:Yes] Posterior Tibial Extremity colors, hair growth, and conditions: Extremity Color: [Left:Normal] Hair Growth on Extremity: [Left:Yes] Temperature of Extremity: [Left:Warm] Capillary Refill: [Left:< 3 seconds] Dependent Rubor: [Left:No] Blanched when Elevated: [Left:No] Lipodermatosclerosis: [Left:No] Toe Nail Assessment Left: Right: Thick: No Discolored: No Deformed: No Improper Length and Hygiene: No Electronic Signature(s) Signed: 05/26/2017 5:52:49 PM By: Gretta Cool, BSN, RN, CWS, Kim RN, BSN Entered By: Gretta Cool, BSN, RN, CWS, Kim on 05/26/2017 09:23:24 Benjamin Soto ([id number]) -------------------------------------------------------------------------------- Multi Wound Chart  Details Patient Name: Benjamin Soto Date of Service: 05/26/2017 9:15 AM Medical Record Number: [id number] Patient Account Number: [id number] Date of Birth/Sex: 1936/05/24 (81 y.o. Male) Treating RN: Cornell Barman Primary Care Chrissa Meetze: Elsie Stain Other Clinician: Referring Dudley Mages: Elsie Stain Treating Flower Franko/Extender: Ricard Dillon Weeks in Treatment: 15 Vital Signs Height(in): 69 Pulse(bpm): 74 Weight(lbs): 185 Blood Pressure 116/64 (mmHg): Body Mass Index(BMI): 27 Temperature(F): 97.6 Respiratory Rate 16 (breaths/min): Photos: [N/A:N/A] Wound Location: Left Lower Leg - Midline  N/A N/A Wounding Event: Trauma N/A N/A Primary Etiology: Trauma, Other N/A N/A Comorbid History: Glaucoma, Chronic N/A N/A Obstructive Pulmonary Disease (COPD), Arrhythmia, Coronary Artery Disease, Hypertension, Type II Diabetes, Osteoarthritis, Neuropathy Date Acquired: 01/23/2017 N/A N/A Weeks of Treatment: 15 N/A N/A Wound Status: Open N/A N/A Measurements L x W x D 1.1x0.3x0.1 N/A N/A (cm) Area (cm) : 0.259 N/A N/A Volume (cm) : 0.026 N/A N/A % Reduction in Area: 94.80% N/A N/A % Reduction in Volume: 94.80% N/A N/A Classification: Full Thickness Without N/A N/A Exposed Support Structures Exudate Amount: Medium N/A N/A Exudate Type: Serosanguineous N/A N/A Benjamin Soto, Benjamin Soto ([id number]) Exudate Color: red, brown N/A N/A Wound Margin: Flat and Intact N/A N/A Granulation Amount: Large (67-100%) N/A N/A Granulation Quality: Red, Hyper-granulation N/A N/A Necrotic Amount: None Present (0%) N/A N/A Exposed Structures: Fascia: No N/A N/A Fat Layer (Subcutaneous Tissue) Exposed: No Tendon: No Muscle: No Joint: No Bone: No Limited to Skin Breakdown Epithelialization: Large (67-100%) N/A N/A Periwound Skin Texture: Excoriation: No N/A N/A Induration: No Callus: No Crepitus: No Rash: No Scarring: No Periwound Skin Maceration: No N/A N/A Moisture: Dry/Scaly:  No Periwound Skin Color: Atrophie Blanche: No N/A N/A Cyanosis: No Ecchymosis: No Erythema: No Hemosiderin Staining: No Mottled: No Pallor: No Rubor: No Temperature: No Abnormality N/A N/A Tenderness on Yes N/A N/A Palpation: Wound Preparation: Ulcer Cleansing: N/A N/A Rinsed/Irrigated with Saline Topical Anesthetic Applied: None Treatment Notes Wound #1 (Left, Midline Lower Leg) 1. Cleansed with: Clean wound with Normal Saline 4. Dressing Applied: Other dressing (specify in notes) 5. Secondary Dressing Applied Kerlix/Conform Benjamin Soto, Benjamin Soto ([id number]) Rainbow Notes Endoform Electronic Signature(s) Signed: 05/26/2017 5:15:21 PM By: Linton Ham MD Entered By: Linton Ham on 05/26/2017 [place], [place] ([id number]) -------------------------------------------------------------------------------- Multi-Disciplinary Care Plan Details Patient Name: Benjamin Soto Date of Service: 05/26/2017 9:15 AM Medical Record Number: [id number] Patient Account Number: [id number] Date of Birth/Sex: 01-23-1936 (81 y.o. Male) Treating RN: Cornell Barman Primary Care Sylvester Salonga: Elsie Stain Other Clinician: Referring Marcial Pless: Elsie Stain Treating Ahana Najera/Extender: Tito Dine in Treatment: 15 Active Inactive ` Abuse / Safety / Falls / Self Care Management Nursing Diagnoses: Potential for falls Goals: Patient will not experience any injury related to falls Date Initiated: 02/09/2017 Target Resolution Date: 04/23/2017 Goal Status: Active Interventions: Assess fall risk on admission and as needed Notes: ` Orientation to the Wound Care Program Nursing Diagnoses: Knowledge deficit related to the wound healing center program Goals: Patient/caregiver will verbalize understanding of the [facility] Date Initiated: 02/09/2017 Target Resolution Date: 04/23/2017 Goal Status: Active Interventions: Provide education on orientation to the wound  center Notes: ` Wound/Skin Impairment Nursing Diagnoses: Impaired tissue integrity Benjamin Soto, Benjamin Soto ([id number]) Goals: Patient/caregiver will verbalize understanding of skin care regimen Date Initiated: 02/09/2017 Target Resolution Date: 04/23/2017 Goal Status: Active Ulcer/skin breakdown will have a volume reduction of 30% by week 4 Date Initiated: 02/09/2017 Target Resolution Date: 04/23/2017 Goal Status: Active Ulcer/skin breakdown will have a volume reduction of 50% by week 8 Date Initiated: 02/09/2017 Target Resolution Date: 04/23/2017 Goal Status: Active Ulcer/skin breakdown will have a volume reduction of 80% by week 12 Date Initiated: 02/09/2017 Target Resolution Date: 04/23/2017 Goal Status: Active Ulcer/skin breakdown will heal within 14 weeks Date Initiated: 02/09/2017 Target Resolution Date: 04/23/2017 Goal Status: Active Interventions: Assess patient/caregiver ability to obtain necessary supplies Assess patient/caregiver ability to perform ulcer/skin care regimen upon admission and as needed Assess ulceration(s) every visit Notes: Electronic Signature(s) Signed: 05/26/2017 5:52:49 PM By: Gretta Cool, BSN, RN, CWS, Kim  RN, BSN Entered By: Gretta Cool, BSN, RN, CWS, Kim on 05/26/2017 09:23:30 Benjamin Soto ([id number]) -------------------------------------------------------------------------------- Pain Assessment Details Patient Name: Benjamin Soto Date of Service: 05/26/2017 9:15 AM Medical Record Number: [id number] Patient Account Number: [id number] Date of Birth/Sex: 1935/10/09 (81 y.o. Male) Treating RN: Cornell Barman Primary Care Nailyn Dearinger: Elsie Stain Other Clinician: Referring Dervin Vore: Elsie Stain Treating Maleeya Peterkin/Extender: Tito Dine in Treatment: 15 Active Problems Location of Pain Severity and Description of Pain Patient Has Paino No Site Locations With Dressing Change: No Pain Management and Medication Current Pain Management: Goals for Pain  Management Topical or injectable lidocaine is offered to patient for acute pain when surgical debridement is performed. If needed, Patient is instructed to use over the counter pain medication for the following 24-48 hours after debridement. Wound care MDs do not prescribed pain medications. Patient has chronic pain or uncontrolled pain. Patient has been instructed to make an appointment with their Primary Care Physician for pain management. Electronic Signature(s) Signed: 05/26/2017 5:52:49 PM By: Gretta Cool, BSN, RN, CWS, Kim RN, BSN Entered By: Gretta Cool, BSN, RN, CWS, Kim on 05/26/2017 09:16:07 Benjamin Soto ([id number]) -------------------------------------------------------------------------------- Patient/Caregiver Education Details Patient Name: Benjamin Soto Date of Service: 05/26/2017 9:15 AM Medical Record Number: [id number] Patient Account Number: [id number] Date of Birth/Gender: Dec 24, 1935 (81 y.o. Male) Treating RN: Cornell Barman Primary Care Physician: Elsie Stain Other Clinician: Referring Physician: Elsie Stain Treating Physician/Extender: Tito Dine in Treatment: 15 Education Assessment Education Provided To: Patient Education Topics Provided Wound/Skin Impairment: Handouts: Caring for Your Ulcer, Other: wound care as prescribed Methods: Demonstration Responses: State content correctly Electronic Signature(s) Signed: 05/26/2017 5:52:49 PM By: Gretta Cool, BSN, RN, CWS, Kim RN, BSN Entered By: Gretta Cool, BSN, RN, CWS, Kim on 05/26/2017 09:38:50 Benjamin Soto ([id number]) -------------------------------------------------------------------------------- Wound Assessment Details Patient Name: Benjamin Soto Date of Service: 05/26/2017 9:15 AM Medical Record Number: [id number] Patient Account Number: [id number] Date of Birth/Sex: [birth year] (81 y.o. Male) Treating RN: Cornell Barman Primary Care Liba Hulsey: Elsie Stain Other Clinician: Referring Raistlin Gum: Elsie Stain Treating Belal Scallon/Extender: Ricard Dillon Weeks in Treatment: 15 Wound Status Wound Number: 1 Primary Trauma, Other Etiology: Wound Location: Left Lower Leg - Midline Wound Open Wounding Event: Trauma Status: Date Acquired: 01/23/2017 Comorbid Glaucoma, Chronic Obstructive Weeks Of Treatment: 15 History: Pulmonary Disease (COPD), Arrhythmia, Clustered Wound: No Coronary Artery Disease, Hypertension, Type II Diabetes, Osteoarthritis, Neuropathy Photos Wound Measurements Length: (cm) 1.1 Width: (cm) 0.3 Depth: (cm) 0.1 Area: (cm) 0.259 Volume: (cm) 0.026 % Reduction in Area: 94.8% % Reduction in Volume: 94.8% Epithelialization: Large (67-100%) Tunneling: No Undermining: No Wound Description Full Thickness Without Exposed Classification: Support Structures Wound Margin: Flat and Intact Exudate Medium Amount: Exudate Type: Serosanguineous Exudate Color: red, brown Foul Odor After Cleansing: No Slough/Fibrino No Wound Bed Granulation Amount: Large (67-100%) Exposed Structure Granulation Quality: Red, Hyper-granulation Fascia Exposed: No Necrotic Amount: None Present (0%) Fat Layer (Subcutaneous Tissue) Exposed: No Benjamin Soto, Benjamin Soto ([id number]) Tendon Exposed: No Muscle Exposed: No Joint Exposed: No Bone Exposed: No Limited to Skin Breakdown Periwound Skin Texture Texture Color No Abnormalities Noted: No No Abnormalities Noted: No Callus: No Atrophie Blanche: No Crepitus: No Cyanosis: No Excoriation: No Ecchymosis: No Induration: No Erythema: No Rash: No Hemosiderin Staining: No Scarring: No Mottled: No Pallor: No Moisture Rubor: No No Abnormalities Noted: No Dry / Scaly: No Temperature / Pain Maceration: No Temperature: No Abnormality Tenderness on Palpation: Yes Wound Preparation Ulcer Cleansing: Rinsed/Irrigated with Saline Topical Anesthetic Applied: None Treatment Notes Wound #1 (Left, Midline Lower Leg) 1. Cleansed  with: Clean wound with Normal Saline 4. Dressing Applied: Other dressing (specify in notes) 5. Secondary Dressing Applied Kerlix/Conform [place] Notes Endoform Electronic Signature(s) Signed: 05/26/2017 5:52:49 PM By: Gretta Cool, BSN, RN, CWS, Kim RN, BSN Entered By: Gretta Cool, BSN, RN, CWS, Kim on 05/26/2017 09:22:51 Benjamin Soto ([id number]) -------------------------------------------------------------------------------- Vitals Details Patient Name: Benjamin Soto Date of Service: 05/26/2017 9:15 AM Medical Record Number: [id number] Patient Account Number: [id number] Date of Birth/Sex: 1936/05/29 (81 y.o. Male) Treating RN: Cornell Barman Primary Care Akansha Wyche: Elsie Stain Other Clinician: Referring Reginal Wojcicki: Elsie Stain Treating Juris Gosnell/Extender: Tito Dine in Treatment: 15 Vital Signs Time Taken: 09:16 Temperature (F): 97.6 Height (in): 69 Pulse (bpm): 74 Weight (lbs): 185 Respiratory Rate (breaths/min): 16 Body Mass Index (BMI): 27.3 Blood Pressure (mmHg): 116/64 Reference Range: 80 - 120 mg / dl Electronic Signature(s) Signed: 05/26/2017 5:52:49 PM By: Gretta Cool, BSN, RN, CWS, Kim RN, BSN Entered By: Gretta Cool, BSN, RN, CWS, Kim on 05/26/2017 09:16:25

## 2017-05-29 NOTE — Telephone Encounter (Signed)
Noted. Thanks.

## 2017-05-31 DIAGNOSIS — H2511 Age-related nuclear cataract, right eye: Secondary | ICD-10-CM | POA: Diagnosis not present

## 2017-06-01 DIAGNOSIS — H2512 Age-related nuclear cataract, left eye: Secondary | ICD-10-CM | POA: Diagnosis not present

## 2017-06-02 ENCOUNTER — Encounter: Payer: Medicare Other | Admitting: Internal Medicine

## 2017-06-02 DIAGNOSIS — E1140 Type 2 diabetes mellitus with diabetic neuropathy, unspecified: Secondary | ICD-10-CM | POA: Diagnosis not present

## 2017-06-02 DIAGNOSIS — I4891 Unspecified atrial fibrillation: Secondary | ICD-10-CM | POA: Diagnosis not present

## 2017-06-02 DIAGNOSIS — Z87891 Personal history of nicotine dependence: Secondary | ICD-10-CM | POA: Diagnosis not present

## 2017-06-02 DIAGNOSIS — M1990 Unspecified osteoarthritis, unspecified site: Secondary | ICD-10-CM | POA: Diagnosis not present

## 2017-06-02 DIAGNOSIS — L97223 Non-pressure chronic ulcer of left calf with necrosis of muscle: Secondary | ICD-10-CM | POA: Diagnosis not present

## 2017-06-02 DIAGNOSIS — J449 Chronic obstructive pulmonary disease, unspecified: Secondary | ICD-10-CM | POA: Diagnosis not present

## 2017-06-02 DIAGNOSIS — E1122 Type 2 diabetes mellitus with diabetic chronic kidney disease: Secondary | ICD-10-CM | POA: Diagnosis not present

## 2017-06-02 DIAGNOSIS — N189 Chronic kidney disease, unspecified: Secondary | ICD-10-CM | POA: Diagnosis not present

## 2017-06-02 DIAGNOSIS — I509 Heart failure, unspecified: Secondary | ICD-10-CM | POA: Diagnosis not present

## 2017-06-02 DIAGNOSIS — S81802A Unspecified open wound, left lower leg, initial encounter: Secondary | ICD-10-CM | POA: Diagnosis not present

## 2017-06-02 DIAGNOSIS — Z7901 Long term (current) use of anticoagulants: Secondary | ICD-10-CM | POA: Diagnosis not present

## 2017-06-02 DIAGNOSIS — I2510 Atherosclerotic heart disease of native coronary artery without angina pectoris: Secondary | ICD-10-CM | POA: Diagnosis not present

## 2017-06-02 DIAGNOSIS — I130 Hypertensive heart and chronic kidney disease with heart failure and stage 1 through stage 4 chronic kidney disease, or unspecified chronic kidney disease: Secondary | ICD-10-CM | POA: Diagnosis not present

## 2017-06-02 DIAGNOSIS — L03116 Cellulitis of left lower limb: Secondary | ICD-10-CM | POA: Diagnosis not present

## 2017-06-02 DIAGNOSIS — S81812D Laceration without foreign body, left lower leg, subsequent encounter: Secondary | ICD-10-CM | POA: Diagnosis not present

## 2017-06-03 NOTE — Progress Notes (Signed)
DEACON, GADBOIS ([id number]) Visit Report for 06/02/2017 HPI Details Patient Name: Benjamin Soto, Benjamin Soto Date of Service: 06/02/2017 9:15 AM Medical Record Number: [id number] Patient Account Number: [id number] Date of Birth/Sex: Aug 02, 1936 (81 y.o. Male) Treating RN: Cornell Barman Primary Care Provider: Elsie Stain Other Clinician: Referring Provider: Elsie Stain Treating Provider/Extender: Tito Dine in Treatment: 16 History of Present Illness HPI Description: 02/09/17; 81 year old man with a hx of type 2 DM, smoker. He suffered a fall 17 days ago while working on the [employer] of his trailer. He didn't realized anything had happed until he returned home. Has been applying polysporin. Received Cephalexin finishes on Saturday. No prior wound history. ABI's 0.96 on the left. No known hx of PAD. Hx of Afib on Coumadin,chf,st 2 crf 02/16/17; patient arrives today with the condition of his wound improved he is on silver alginate. Culture of the divot medially last week grew 2 different types of enterococcus including Enterococcus faecalis. He is on Augmentin. Surface of the wound looks better. Still debridement required today 02/23/17; traumatic wound on the left anterior leg. He has finished Augmentin. He found debridement last week painful fortunately none was required today 03/02/17; traumatic wound on the left anterior leg. Still 75% necrotic surface material requiring debridement. Most of the rest that it is stable 03/09/17; initially traumatic wound on his left leg. Arrives today with complaints of more pain and more erythema around the wound. States the pain is severe enough that it keeps him awake at night. The original wound surface itself looks good and the divot that was laterally has closed over. We switched him to [facility] last week. Wife shows me a lot of drainage coming out of the wound surface 03/16/17; the patient comes in today with a better-looking wound circumference no  erythema. He is completed the doxycycline I gave him. He still complains of a lot of pain which he states radiates into the foot and keeps him awake at night. It seems that the original injury happened when he fell backwards and the anterior leg came up against part of a trailer hitch is the best I'm able to determine. He also hurt his lower coccyx. We have been using silver alginate since last week largely because of the infection 03/23/17; no major change in this man's wounds. He has leaking edema fluid. Surface of the wound looks better we have been using silver alginate. Still complaining of a lot of discomfort 04/28/17 on evaluation today patient's left lower extremity wound on the interior shin appears to be doing extremely well. There is slight hyper granulation but overall he has been tolerating the dressing changes without complication. There is no report of fevers, chills, nausea, vomiting, or diarrhea. Patient has minimal discomfort at most a one out of 10 and that's mainly with cleansing and debridement week by week that he knows this. 03/31/17 on evaluation today patient appears to be doing worse unfortunately. His wound appears to be infected and is more tender to palpation. Nothing has changed other than he has discontinued his antibiotic at this point. 04/07/17; patient continues to decline with erythema around the wound. Culture last week did not significantly culture and organism. There was no staph aureus no group A strep although the Gram stain showed abundant gram-negative rods. The only culture we did when he initially came in here showed both enterococcus and Enterobacter. I gave him a course of Augmentin and things really seemed to settle down although I don't know that the Enterobacter  would've been treated well. He still complains of a lot of pain. He a lot of complaints about the wrap Benjamin Soto, Benjamin Soto ([id number]) 04/14/17; the patient has a better-looking wound today however  he has less edema control now that we are not putting him under compression. X-ray I did last week was negative. Culture and sensitivity negative. No need for further antibiotics 04/21/17; the patient arrives today with a better-looking wound. Healthy granulation and reduction in the width of the wound. He did not do well with Hydrofera Blue we have been using Endo form 05/05/17 on evaluation today patient's left lower some the wound appears to be doing very well. We have been using the Endoform and noting excellent progress. Fortunately patient states that he has not been having any significant pain and is also likewise pleased with how things have progressed. This has done much better than the [facility] Dressing. No fevers, chills, nausea, or vomiting noted at this time. 05/12/17; nice progression healthy granulation with advancing epithelialization using Endo form 05/19/17; really a nice progress since last week. Wound is more than 50% improved in terms of dimensions. Progressive healthy epithelialization we have been using Endoform 05/26/17; continued nice progress, the wound on his left leg is almost closed. He has one remaining open area from the original wound and probably one area that was caused by removing some adherent Endoform. This is small and at the base of the wound. Originally this was traumatic which became secondarily infected. I think there was also a subcutaneous abscess medially and this wound with a culture growing both enterococcus and Enterobacter. He had a prolonged course of Augmentin 06/02/17; the patient's wound is totally epithelialized and we have deferred this healed today. I've advised him to keep this covered with a thick band aid or border foam. Initially a traumatic wound that became secondarily infected before he came here. This was a very difficult situation for 2-3 weeks after he arrived here. Electronic Signature(s) Signed: 06/02/2017 4:34:43 PM By: Linton Ham MD Entered By: Linton Ham on 06/02/2017 09:23:33 Benjamin Soto ([id number]) -------------------------------------------------------------------------------- Physical Exam Details Patient Name: Benjamin Soto Date of Service: 06/02/2017 9:15 AM Medical Record Number: [id number] Patient Account Number: [id number] Date of Birth/Sex: 1936/07/24 (81 y.o. Male) Treating RN: Cornell Barman Primary Care Provider: Elsie Stain Other Clinician: Referring Provider: Elsie Stain Treating Provider/Extender: Ricard Dillon Weeks in Treatment: 16 Constitutional Sitting or standing Blood Pressure is within target range for patient.. Pulse regular and within target range for patient.Marland Kitchen Respirations regular, non-labored and within target range.. Temperature is normal and within the target range for the patient.Marland Kitchen appears in no distress. Cardiovascular Pedal pulses palpable on the left. There is no edema. Notes When exam; the patient has surface epithelialization over the wound on the left leg. There is no open area. No surrounding infection. No edema Electronic Signature(s) Signed: 06/02/2017 4:34:43 PM By: Linton Ham MD Entered By: Linton Ham on 06/02/2017 09:25:49 Benjamin Soto ([id number]) -------------------------------------------------------------------------------- Physician Orders Details Patient Name: Benjamin Soto Date of Service: 06/02/2017 9:15 AM Medical Record Number: [id number] Patient Account Number: [id number] Date of Birth/Sex: 1936-09-05 (81 y.o. Male) Treating RN: Cornell Barman Primary Care Provider: Elsie Stain Other Clinician: Referring Provider: Elsie Stain Treating Provider/Extender: Tito Dine in Treatment: 65 Verbal / Phone Orders: No Diagnosis Coding Discharge From [facility] Services Wound #1 Left,Midline Lower Leg o Discharge from [place] - treatment compete Electronic Signature(s) Signed: 06/02/2017 4:34:43 PM By:  Linton Ham MD Signed: 06/02/2017 4:53:55 PM  By: Gretta Cool, BSN, RN, CWS, Kim RN, BSN Entered By: Gretta Cool, BSN, RN, CWS, Kim on 06/02/2017 09:16:26 Benjamin Soto ([id number]) -------------------------------------------------------------------------------- Problem List Details Patient Name: Benjamin Soto Date of Service: 06/02/2017 9:15 AM Medical Record Number: [id number] Patient Account Number: [id number] Date of Birth/Sex: 20-Oct-1935 (81 y.o. 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 Male) Treating RN: Cornell Barman Primary Care Provider: Elsie Stain Other Clinician: Referring Provider: Elsie Stain Treating Provider/Extender: Ricard Dillon Weeks in Treatment: 16 Subjective History of Present Illness (HPI) 02/09/17; 81 year old man with a hx of type 2 DM, smoker. He suffered a fall 17 days ago while working on the [employer] of his trailer. He didn't realized anything had happed until he returned home. Has been applying polysporin. Received Cephalexin finishes on Saturday.  No prior wound history. ABI's 0.96 on the left. No known hx of PAD. Hx of Afib on Coumadin,chf,st 2 crf 02/16/17; patient arrives today with the condition of his wound improved he is on silver alginate. Culture of the divot medially last week grew 2 different types of enterococcus including Enterococcus faecalis. He is on Augmentin. Surface of the wound looks better. Still debridement required today 02/23/17; traumatic wound on the left anterior leg. He has finished Augmentin. He found debridement last week painful fortunately none was required today 03/02/17; traumatic wound on the left anterior leg. Still 75% necrotic surface material requiring debridement. Most of the rest that it is stable 03/09/17; initially traumatic wound on his left leg. Arrives today with complaints of more pain and more erythema around the wound. States the pain is severe enough that it keeps him awake at night. The original wound surface itself looks good and the divot that was laterally has closed over. We switched him to [facility] last week. Wife shows me a lot of drainage coming out of the wound surface 03/16/17; the patient comes in today with a better-looking wound circumference no erythema. He is completed the doxycycline I gave him. He still complains of a lot of pain which he states radiates into the foot and keeps him awake at night. It seems that the original injury happened when he fell backwards and the anterior leg came up against part of a trailer hitch is the best I'm able to determine. He also hurt his lower coccyx. We have been using silver alginate since last week largely because of the infection 03/23/17; no major change in this man's wounds. He has leaking edema fluid. Surface of the wound looks better we have been using silver alginate. Still complaining of a lot of discomfort 04/28/17 on evaluation today patient's left lower extremity wound on the interior shin appears to be doing extremely well.  There is slight hyper granulation but overall he has been tolerating the dressing changes without complication. There is no report of fevers, chills, nausea, vomiting,  or diarrhea. Patient has minimal discomfort at most a one out of 10 and that's mainly with cleansing and debridement week by week that he knows this. 03/31/17 on evaluation today patient appears to be doing worse unfortunately. His wound appears to be infected and is more tender to palpation. Nothing has changed other than he has discontinued his antibiotic at this point. 04/07/17; patient continues to decline with erythema around the wound. Culture last week did not significantly culture and organism. There was no staph aureus no group A strep although the Gram stain showed abundant gram-negative rods. The only culture we did when he initially came in here showed both enterococcus and Enterobacter. I gave him a course of Augmentin and things really seemed to settle down although I don't know that the Enterobacter would've been treated well. He still complains of a lot of pain. He a lot of complaints about the wrap 04/14/17; the patient has a better-looking wound today however he has less edema control now that we are not putting him under compression. X-ray I did last week was negative. Culture and sensitivity negative. No Benjamin Soto, Benjamin Soto ([id number]) need for further antibiotics 04/21/17; the patient arrives today with a better-looking wound. Healthy granulation and reduction in the width of the wound. He did not do well with Hydrofera Blue we have been using Endo form 05/05/17 on evaluation today patient's left lower some the wound appears to be doing very well. We have been using the Endoform and noting excellent progress. Fortunately patient states that he has not been having any significant pain and is also likewise pleased with how things have progressed. This has done much better than the [facility] Dressing. No fevers,  chills, nausea, or vomiting noted at this time. 05/12/17; nice progression healthy granulation with advancing epithelialization using Endo form 05/19/17; really a nice progress since last week. Wound is more than 50% improved in terms of dimensions. Progressive healthy epithelialization we have been using Endoform 05/26/17; continued nice progress, the wound on his left leg is almost closed. He has one remaining open area from the original wound and probably one area that was caused by removing some adherent Endoform. This is small and at the base of the wound. Originally this was traumatic which became secondarily infected. I think there was also a subcutaneous abscess medially and this wound with a culture growing both enterococcus and Enterobacter. He had a prolonged course of Augmentin 06/02/17; the patient's wound is totally epithelialized and we have deferred this healed today. I've advised him to keep this covered with a thick band aid or border foam. Initially a traumatic wound that became secondarily infected before he came here. This was a very difficult situation for 2-3 weeks after he arrived here. Objective Constitutional Sitting or standing Blood Pressure is within target range for patient.. Pulse regular and within target range for patient.Marland Kitchen Respirations regular, non-labored and within target range.. Temperature is normal and within the target range for the patient.Marland Kitchen appears in no distress. Vitals Time Taken: 9:03 AM, Height: 69 in, Weight: 185 lbs, BMI: 27.3, Temperature: 97.5 F, Pulse: 73 bpm, Respiratory Rate: 16 breaths/min, Blood Pressure: 118/71 mmHg. Cardiovascular Pedal pulses palpable on the left. There is no edema. General Notes: When exam; the patient has surface epithelialization over the wound on the left leg. There is no open area. No surrounding infection. No edema Integumentary (Hair, Skin) Wound #1 status is Healed - Epithelialized. Original cause of wound was  Trauma. The wound is located on  the Left,Midline Lower Leg. The wound measures 0cm length x 0cm width x 0cm depth; 0cm^2 area and Benjamin Soto, Benjamin Soto ([id number]) 0cm^3 volume. There is no tunneling noted. 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 Otherwise there is no specific secondary prevention require Electronic Signature(s) Signed: 06/02/2017 4:34:43 PM By: Linton Ham MD Entered By: Linton Ham on 06/02/2017 09:26:29 Benjamin Soto ([id number]) -------------------------------------------------------------------------------- [place] Details Patient Name: Benjamin Soto Date of Service: 06/02/2017 Medical Record Number: [id number] Patient Account Number: [id number] Date of Birth/Sex: 31-Jan-1936 (81 y.o. 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

## 2017-06-03 NOTE — Progress Notes (Signed)
Benjamin Soto, Benjamin Soto ([id number]) Visit Report for 06/02/2017 Arrival Information Details Patient Name: Benjamin Soto, Benjamin Soto Date of Service: 06/02/2017 9:15 AM Medical Record Number: [id number] Patient Account Number: [id number] Date of Birth/Sex: 1936/01/16 (81 y.o. Male) Treating RN: Cornell Barman Primary Care Rachael Ferrie: Elsie Stain Other Clinician: Referring Daemian Gahm: Elsie Stain Treating Prisilla Kocsis/Extender: Tito Dine in Treatment: 64 Visit Information History Since Last Visit Added or deleted any medications: No Patient Arrived: Ambulatory Any new allergies or adverse reactions: No Arrival Time: 09:02 Had a fall or experienced change in No Accompanied By: wife activities of daily living that may affect Transfer Assistance: None risk of falls: Patient Identification Verified: Yes Signs or symptoms of abuse/neglect since last No Secondary Verification Process Yes visito Completed: Hospitalized since last visit: No Patient Requires Transmission- No Has Dressing in Place as Prescribed: Yes Based Precautions: Pain Present Now: No Patient Has Alerts: Yes Patient Alerts: Patient on Blood Thinner warfarin Type II Diabetic Electronic Signature(s) Signed: 06/02/2017 4:53:55 PM By: Gretta Cool, BSN, RN, CWS, Kim RN, BSN Entered By: Gretta Cool, BSN, RN, CWS, Kim on 06/02/2017 09:03:12 Benjamin Soto ([id number]) -------------------------------------------------------------------------------- Clinic Level of Care Assessment Details Patient Name: Benjamin Soto Date of Service: 06/02/2017 9:15 AM Medical Record Number: [id number] Patient Account Number: [id number] Date of Birth/Sex: Dec 10, 1935 (81 y.o. Male) Treating RN: Cornell Barman Primary Care Elina Streng: Elsie Stain Other Clinician: Referring Andrell Tallman: Elsie Stain Treating Shermeka Rutt/Extender: Tito Dine in Treatment: 16 Clinic Level of Care Assessment Items TOOL 4 Quantity Score []  - Use when only an EandM is  performed on FOLLOW-UP visit 0 ASSESSMENTS - Nursing Assessment / Reassessment []  - Reassessment of Co-morbidities (includes updates in patient status) 0 X - Reassessment of Adherence to Treatment Plan 1 5 ASSESSMENTS - Wound and Skin Assessment / Reassessment X - Simple Wound Assessment / Reassessment - one wound 1 5 []  - Complex Wound Assessment / Reassessment - multiple wounds 0 []  - Dermatologic / Skin Assessment (not related to wound area) 0 ASSESSMENTS - Focused Assessment []  - Circumferential Edema Measurements - multi extremities 0 []  - Nutritional Assessment / Counseling / Intervention 0 []  - Lower Extremity Assessment (monofilament, tuning fork, pulses) 0 []  - Peripheral Arterial Disease Assessment (using hand held doppler) 0 ASSESSMENTS - Ostomy and/or Continence Assessment and Care []  - Incontinence Assessment and Management 0 []  - Ostomy Care Assessment and Management (repouching, etc.) 0 PROCESS - Coordination of Care []  - Simple Patient / Family Education for ongoing care 0 []  - Complex (extensive) Patient / Family Education for ongoing care 0 []  - Staff obtains Programmer, systems, Records, Test Results / Process Orders 0 []  - Staff telephones HHA, Nursing Homes / Clarify orders / etc 0 []  - Routine Transfer to another Facility (non-emergent condition) 0 RAIFE, LIZER ([id number]) []  - Routine Hospital Admission (non-emergent condition) 0 []  - New Admissions / Biomedical engineer / Ordering NPWT, Apligraf, etc. 0 []  - Emergency Hospital Admission (emergent condition) 0 []  - Simple Discharge Coordination 0 []  - Complex (extensive) Discharge Coordination 0 PROCESS - Special Needs []  - Pediatric / Minor Patient Management 0 []  - Isolation Patient Management 0 []  - Hearing / Language / Visual special needs 0 []  - Assessment of Community assistance (transportation, D/C planning, etc.) 0 []  - Additional assistance / Altered mentation 0 []  - Support Surface(s) Assessment (bed,  cushion, seat, etc.) 0 INTERVENTIONS - Wound Cleansing / Measurement []  - Simple Wound Cleansing - one wound 0 []  - Complex Wound Cleansing - multiple wounds 0 X - Wound  Imaging (photographs - any number of wounds) 1 5 []  - Wound Tracing (instead of photographs) 0 []  - Simple Wound Measurement - one wound 0 []  - Complex Wound Measurement - multiple wounds 0 INTERVENTIONS - Wound Dressings []  - Small Wound Dressing one or multiple wounds 0 []  - Medium Wound Dressing one or multiple wounds 0 []  - Large Wound Dressing one or multiple wounds 0 []  - Application of Medications - topical 0 []  - Application of Medications - injection 0 INTERVENTIONS - Miscellaneous []  - External ear exam 0 Benjamin Soto, Benjamin Soto ([id number]) []  - Specimen Collection (cultures, biopsies, blood, body fluids, etc.) 0 []  - Specimen(s) / Culture(s) sent or taken to Lab for analysis 0 []  - Patient Transfer (multiple staff / Harrel Lemon Lift / Similar devices) 0 []  - Simple Staple / Suture removal (25 or less) 0 []  - Complex Staple / Suture removal (26 or more) 0 []  - Hypo / Hyperglycemic Management (close monitor of Blood Glucose) 0 []  - Ankle / Brachial Index (ABI) - do not check if billed separately 0 X - Vital Signs 1 5 Has the patient been seen at the hospital within the last three years: Yes Total Score: 20 Level Of Care: New/Established - Level 1 Electronic Signature(s) Signed: 06/02/2017 4:53:55 PM By: Gretta Cool, BSN, RN, CWS, Kim RN, BSN Entered By: Gretta Cool, BSN, RN, CWS, Kim on 06/02/2017 09:20:35 Benjamin Soto ([id number]) -------------------------------------------------------------------------------- Encounter Discharge Information Details Patient Name: Benjamin Soto Date of Service: 06/02/2017 9:15 AM Medical Record Number: [id number] Patient Account Number: [id number] Date of Birth/Sex: 1935/09/23 (81 y.o. Male) Treating RN: Cornell Barman Primary Care Riane Rung: Elsie Stain Other Clinician: Referring Aurel Nguyen:  Elsie Stain Treating Warner Laduca/Extender: Tito Dine in Treatment: 16 Encounter Discharge Information Items Discharge Pain Level: 0 Discharge Condition: Stable Ambulatory Status: Ambulatory Discharge Destination: Home Transportation: Private Auto Accompanied By: wife Schedule Follow-up Appointment: Yes Medication Reconciliation completed and provided to Patient/Care Yes Kylie Simmonds: Provided on Clinical Summary of Care: 06/02/2017 Form Type Recipient Paper Patient HD Electronic Signature(s) Signed: 06/02/2017 4:53:55 PM By: Gretta Cool, BSN, RN, CWS, Kim RN, BSN Entered By: Gretta Cool, BSN, RN, CWS, Kim on 06/02/2017 09:21:48 Benjamin Soto ([id number]) -------------------------------------------------------------------------------- Lower Extremity Assessment Details Patient Name: Benjamin Soto Date of Service: 06/02/2017 9:15 AM Medical Record Number: [id number] Patient Account Number: [id number] Date of Birth/Sex: [birth year] (81 y.o. Male) Treating RN: Cornell Barman Primary Care Janin Kozlowski: Elsie Stain Other Clinician: Referring Damarkus Balis: Elsie Stain Treating Castin Donaghue/Extender: Ricard Dillon Weeks in Treatment: 16 Vascular Assessment Pulses: Dorsalis Pedis Palpable: [Left:Yes] Posterior Tibial Extremity colors, hair growth, and conditions: Extremity Color: [Left:Normal] Hair Growth on Extremity: [Left:Yes] Temperature of Extremity: [Left:Warm] Capillary Refill: [Left:< 3 seconds] Toe Nail Assessment Left: Right: Thick: No Discolored: No Deformed: No Improper Length and Hygiene: No Electronic Signature(s) Signed: 06/02/2017 4:53:55 PM By: Gretta Cool, BSN, RN, CWS, Kim RN, BSN Entered By: Gretta Cool, BSN, RN, CWS, Kim on 06/02/2017 09:07:18 Benjamin Soto ([id number]) -------------------------------------------------------------------------------- Multi Wound Chart Details Patient Name: Benjamin Soto Date of Service: 06/02/2017 9:15 AM Medical Record Number:  [id number] Patient Account Number: [id number] Date of Birth/Sex: Nov 25, 1935 (81 y.o. Male) Treating RN: Cornell Barman Primary Care Jedi Catalfamo: Elsie Stain Other Clinician: Referring Langdon Crosson: Elsie Stain Treating Samari Gorby/Extender: Ricard Dillon Weeks in Treatment: 16 Vital Signs Height(in): 69 Pulse(bpm): 73 Weight(lbs): 185 Blood Pressure 118/71 (mmHg): Body Mass Index(BMI): 27 Temperature(F): 97.5 Respiratory Rate 16 (breaths/min): Wound Assessments Treatment Notes Electronic Signature(s) Signed: 06/02/2017 4:34:43 PM By: Linton Ham MD Entered By: Linton Ham on 06/02/2017 09:20:30 Benjamin Soto, Benjamin Soto (  [id number]) -------------------------------------------------------------------------------- Multi-Disciplinary Care Plan Details Patient Name: Benjamin Soto, Benjamin Soto Date of Service: 06/02/2017 9:15 AM Medical Record Number: [id number] Patient Account Number: [id number] Date of Birth/Sex: 08/13/1936 (81 y.o. Male) Treating RN: Cornell Barman Primary Care Jermani Eberlein: Elsie Stain Other Clinician: Referring Tyese Finken: Elsie Stain Treating Kaysey Berndt/Extender: Tito Dine in Treatment: 16 Active Inactive Electronic Signature(s) Signed: 06/02/2017 4:53:55 PM By: Gretta Cool, BSN, RN, CWS, Kim RN, BSN Entered By: Gretta Cool, BSN, RN, CWS, Kim on 06/02/2017 09:15:47 Benjamin Soto ([id number]) -------------------------------------------------------------------------------- Pain Assessment Details Patient Name: Benjamin Soto Date of Service: 06/02/2017 9:15 AM Medical Record Number: [id number] Patient Account Number: [id number] Date of Birth/Sex: Sep 18, 1936 (81 y.o. Male) Treating RN: Cornell Barman Primary Care Lars Jeziorski: Elsie Stain Other Clinician: Referring Delvecchio Madole: Elsie Stain Treating Samyuktha Brau/Extender: Tito Dine in Treatment: 16 Active Problems Location of Pain Severity and Description of Pain Patient Has Paino No Site Locations With Dressing  Change: No Pain Management and Medication Current Pain Management: Goals for Pain Management Topical or injectable lidocaine is offered to patient for acute pain when surgical debridement is performed. If needed, Patient is instructed to use over the counter pain medication for the following 24-48 hours after debridement. Wound care MDs do not prescribed pain medications. Patient has chronic pain or uncontrolled pain. Patient has been instructed to make an appointment with their Primary Care Physician for pain management. Electronic Signature(s) Signed: 06/02/2017 4:53:55 PM By: Gretta Cool, BSN, RN, CWS, Kim RN, BSN Entered By: Gretta Cool, BSN, RN, CWS, Kim on 06/02/2017 09:03:30 Benjamin Soto ([id number]) -------------------------------------------------------------------------------- Patient/Caregiver Education Details Patient Name: Benjamin Soto Date of Service: 06/02/2017 9:15 AM Medical Record Number: [id number] Patient Account Number: [id number] Date of Birth/Gender: 26-Mar-1936 (81 y.o. Male) Treating RN: Cornell Barman Primary Care Physician: Elsie Stain Other Clinician: Referring Physician: Elsie Stain Treating Physician/Extender: Tito Dine in Treatment: 16 Education Assessment Education Provided To: Patient Education Topics Provided Wound/Skin Impairment: Handouts: Other: protect new skin Methods: Demonstration Responses: State content correctly Electronic Signature(s) Signed: 06/02/2017 4:53:55 PM By: Gretta Cool, BSN, RN, CWS, Kim RN, BSN Entered By: Gretta Cool, BSN, RN, CWS, Kim on 06/02/2017 09:22:34 Benjamin Soto ([id number]) -------------------------------------------------------------------------------- Wound Assessment Details Patient Name: Benjamin Soto Date of Service: 06/02/2017 9:15 AM Medical Record Number: [id number] Patient Account Number: [id number] Date of Birth/Sex: 10/12/1935 (81 y.o. Male) Treating RN: Cornell Barman Primary Care Deionna Marcantonio: Elsie Stain Other Clinician: Referring Jlee Harkless: Elsie Stain Treating Latonga Ponder/Extender: Ricard Dillon Weeks in Treatment: 16 Wound Status Wound Number: 1 Primary Trauma, Other Etiology: Wound Location: Left Lower Leg - Midline Wound Healed - Epithelialized Wounding Event: Trauma Status: Date Acquired: 01/23/2017 Comorbid Glaucoma, Chronic Obstructive Weeks Of Treatment: 16 History: Pulmonary Disease (COPD), Arrhythmia, Clustered Wound: No Coronary Artery Disease, Hypertension, Type II Diabetes, Osteoarthritis, Neuropathy Photos Wound Measurements Length: (cm) 0 % Reducti Width: (cm) 0 % Reducti Depth: (cm) 0 Epithelia Area: (cm) 0 Tunnelin Volume: (cm) 0 on in Area: 100% on in Volume: 100% lization: Large (67-100%) g: No Wound Description Full Thickness Without Exposed Classification: Support Structures Periwound Skin Texture Texture Color No Abnormalities Noted: No No Abnormalities Noted: No Moisture No Abnormalities Noted: No Electronic Signature(s) Signed: 06/02/2017 4:53:55 PM By: Gretta Cool, BSN, RN, CWS, Kim RN, BSN Benjamin Soto, Benjamin Soto ([id number]) Entered By: Gretta Cool, BSN, RN, CWS, Kim on 06/02/2017 09:21:19 Benjamin Soto ([id number]) -------------------------------------------------------------------------------- Benjamin Soto Details Patient Name: Benjamin Soto Date of Service: 06/02/2017 9:15 AM Medical Record Number: [id number] Patient Account Number: [id number] Date of Birth/Sex: 1936/03/31 (81 y.o. Male) Treating RN: Cornell Barman Primary Care Oyinkansola Truax: Damita Dunnings,  GRAHAM Other Clinician: Referring Uzziah Rigg: Elsie Stain Treating Tirso Laws/Extender: Tito Dine in Treatment: 16 Vital Signs Time Taken: 09:03 Temperature (F): 97.5 Height (in): 69 Pulse (bpm): 73 Weight (lbs): 185 Respiratory Rate (breaths/min): 16 Body Mass Index (BMI): 27.3 Blood Pressure (mmHg): 118/71 Reference Range: 80 - 120 mg / dl Electronic Signature(s) Signed:  06/02/2017 4:53:55 PM By: Gretta Cool, BSN, RN, CWS, Kim RN, BSN Entered By: Gretta Cool, BSN, RN, CWS, Kim on 06/02/2017 09:03:56

## 2017-06-21 DIAGNOSIS — H2512 Age-related nuclear cataract, left eye: Secondary | ICD-10-CM | POA: Diagnosis not present

## 2017-06-22 ENCOUNTER — Ambulatory Visit (INDEPENDENT_AMBULATORY_CARE_PROVIDER_SITE_OTHER): Payer: Medicare Other

## 2017-06-22 ENCOUNTER — Ambulatory Visit: Payer: Medicare Other

## 2017-06-22 DIAGNOSIS — Z7901 Long term (current) use of anticoagulants: Secondary | ICD-10-CM | POA: Diagnosis not present

## 2017-06-22 LAB — POCT INR: INR: 1.8

## 2017-06-22 NOTE — Patient Instructions (Signed)
Pre visit review using our clinic review tool, if applicable. No additional management support is needed unless otherwise documented below in the visit note. 

## 2017-06-27 NOTE — Progress Notes (Signed)
Agree, thanks

## 2017-07-01 ENCOUNTER — Ambulatory Visit (INDEPENDENT_AMBULATORY_CARE_PROVIDER_SITE_OTHER): Payer: Medicare Other

## 2017-07-01 ENCOUNTER — Other Ambulatory Visit: Payer: Medicare Other

## 2017-07-01 ENCOUNTER — Other Ambulatory Visit (INDEPENDENT_AMBULATORY_CARE_PROVIDER_SITE_OTHER): Payer: Medicare Other

## 2017-07-01 DIAGNOSIS — E118 Type 2 diabetes mellitus with unspecified complications: Secondary | ICD-10-CM | POA: Diagnosis not present

## 2017-07-01 DIAGNOSIS — Z23 Encounter for immunization: Secondary | ICD-10-CM

## 2017-07-01 LAB — BASIC METABOLIC PANEL
BUN: 16 mg/dL (ref 6–23)
CO2: 27 mEq/L (ref 19–32)
Calcium: 8.3 mg/dL — ABNORMAL LOW (ref 8.4–10.5)
Chloride: 102 mEq/L (ref 96–112)
Creatinine, Ser: 1.16 mg/dL (ref 0.40–1.50)
GFR: 64.51 mL/min (ref >60.00)
Glucose, Bld: 93 mg/dL (ref 70–99)
Potassium: 4.8 mEq/L (ref 3.5–5.1)
Sodium: 135 mEq/L (ref 135–145)

## 2017-07-01 LAB — HEMOGLOBIN A1C: Hgb A1c MFr Bld: 6 % (ref 4.6–6.5)

## 2017-07-02 ENCOUNTER — Other Ambulatory Visit: Payer: Self-pay | Admitting: Family Medicine

## 2017-07-02 ENCOUNTER — Encounter: Payer: Self-pay | Admitting: Family Medicine

## 2017-07-02 DIAGNOSIS — E119 Type 2 diabetes mellitus without complications: Secondary | ICD-10-CM

## 2017-07-16 ENCOUNTER — Other Ambulatory Visit: Payer: Self-pay | Admitting: Family Medicine

## 2017-07-20 ENCOUNTER — Ambulatory Visit: Payer: Medicare Other

## 2017-07-20 ENCOUNTER — Ambulatory Visit (INDEPENDENT_AMBULATORY_CARE_PROVIDER_SITE_OTHER): Payer: Medicare Other

## 2017-07-20 DIAGNOSIS — I482 Chronic atrial fibrillation, unspecified: Secondary | ICD-10-CM

## 2017-07-20 DIAGNOSIS — Z7901 Long term (current) use of anticoagulants: Secondary | ICD-10-CM

## 2017-07-20 LAB — POCT INR: INR: 2.2

## 2017-07-20 NOTE — Patient Instructions (Signed)
Pre visit review using our clinic review tool, if applicable. No additional management support is needed unless otherwise documented below in the visit note. 

## 2017-07-25 NOTE — Progress Notes (Signed)
Agreed, thanks

## 2017-07-27 DIAGNOSIS — Z7901 Long term (current) use of anticoagulants: Secondary | ICD-10-CM | POA: Insufficient documentation

## 2017-07-27 DIAGNOSIS — I482 Chronic atrial fibrillation, unspecified: Secondary | ICD-10-CM | POA: Insufficient documentation

## 2017-08-01 ENCOUNTER — Other Ambulatory Visit: Payer: Self-pay | Admitting: Family Medicine

## 2017-08-06 ENCOUNTER — Ambulatory Visit: Payer: Medicare Other | Admitting: Cardiovascular Disease

## 2017-08-17 ENCOUNTER — Ambulatory Visit (INDEPENDENT_AMBULATORY_CARE_PROVIDER_SITE_OTHER): Payer: Medicare Other

## 2017-08-17 DIAGNOSIS — I482 Chronic atrial fibrillation, unspecified: Secondary | ICD-10-CM

## 2017-08-17 DIAGNOSIS — Z7901 Long term (current) use of anticoagulants: Secondary | ICD-10-CM

## 2017-08-17 LAB — POCT INR: INR: 1.8

## 2017-08-17 NOTE — Patient Instructions (Addendum)
INR today 1.8  Take 1.5 pills (7.5mg ) today 11/27 and then Continue taking 1 pill (5mg ) daily EXCEPT for 1/2 pill (2.5mg ) on Saturdays.  Recheck in 3-4 weeks.   Patient doing well and cannot account for any reason for drop in today's level.  He verbalizes understanding of all instructions given today and risks associated with subtherpeutic level.  He will go to the ER if any concerns develop.

## 2017-08-18 NOTE — Progress Notes (Signed)
Agree. Thanks

## 2017-08-24 ENCOUNTER — Ambulatory Visit (INDEPENDENT_AMBULATORY_CARE_PROVIDER_SITE_OTHER): Payer: Medicare Other | Admitting: Cardiovascular Disease

## 2017-08-24 ENCOUNTER — Encounter: Payer: Self-pay | Admitting: Nurse Practitioner

## 2017-08-24 VITALS — BP 120/60 | HR 82 | Ht 69.0 in | Wt 187.8 lb

## 2017-08-24 DIAGNOSIS — I2510 Atherosclerotic heart disease of native coronary artery without angina pectoris: Secondary | ICD-10-CM

## 2017-08-24 DIAGNOSIS — I5022 Chronic systolic (congestive) heart failure: Secondary | ICD-10-CM | POA: Diagnosis not present

## 2017-08-24 DIAGNOSIS — I482 Chronic atrial fibrillation, unspecified: Secondary | ICD-10-CM

## 2017-08-24 DIAGNOSIS — I471 Supraventricular tachycardia, unspecified: Secondary | ICD-10-CM

## 2017-08-24 DIAGNOSIS — I951 Orthostatic hypotension: Secondary | ICD-10-CM | POA: Diagnosis not present

## 2017-08-24 DIAGNOSIS — E782 Mixed hyperlipidemia: Secondary | ICD-10-CM | POA: Diagnosis not present

## 2017-08-24 NOTE — Progress Notes (Signed)
Cardiology Office Note  Date:  08/24/2017   ID:  Benjamin Soto, DOB 1936/01/07, MRN [id number]  PCP:  Tonia Ghent, MD   Chief Complaint  Patient presents with  . other    OD 32mo f/u no complaints today. Pt  Reviewed meds with pt verbally.    HPI:  Mr. Sittner is a pleasant 81 year-old gentleman with a history of chronic atrial fibrillation, on warfarin long smoking hx, stopped in past few months heavy ETOH hx,  remote history of SVT,  dilated cardiomyopathy severe arthritis in his knees,   diabetes,  hyperlipidemia,  who presents for routine followup Of his nonischemic cardiomyopathy  cardiac catheterization in May 2014 showing nonobstructive CAD, ejection fraction 30-35% Echocardiogram with ejection fraction 20-25%, last evaluated in 2015 History low testosterone in the past, is not on any supplement  Lisinopril was held previously secondary to low blood pressures and dizziness.   In follow-up today he reports that he feels well Wife was concerned about his heart rate Rate today in the 80s, atrial fibrillation, he is asymptomatic  Recent wound left shin, took 4 months to heal During that time he stopped smoking  Does not take Lasix  denies any worsening leg edema, abdominal bloating, orthopnea or PND Still drinking alcohol No regular exercise program, denies shortness of breath Legs getting weaker  Lab work discussed with him in detail HBA1C 6.0 Total chol 131, LDL 51  EKG shows atrial fibrillation with  Right bundle branch block, left axis deviation, rate 82 bpm  Other past medical history   drinks wine on a regular basis, sometimes in heavy amounts.  History of falls, mechanical fall over the summer 2014  on concrete  Previous episode of confusion, amnesia. Wife witnessed episode 08/09/2013. She felt he was having a stroke. 911 was called and he was transported to [facility].  Carotid ultrasound showed mild bilateral carotid disease. INR at the time was  greater than 2. CT scan of the brain, MRI of the brain showed diffuse cortical atrophy, chronic ischemic white matter disease TIA or stroke. EKG 08/09/2013 showing atrial fibrillation with rate 130 beats per minute. He attributed everything to scalp resection procedure done twice with dermatology prior to the event. He had significant pain around his head prior to the episode. Significant oozing from the wound site. This was fixed on repeat surgery, scraping with additional stitches placed.  Echocardiogram last in mid 2015  showing ejection fraction 20-25%, moderate mitral regurgitation, moderate LVH  CT Scan of his abdomen  showed coronary calcifications.   chest x-ray showed calcified granuloma in the right lower lobe which was not seen in 2012 Labs show total cholesterol less than 100, LDL in the 40 range    PMH:   has a past medical history of Alcohol abuse, Anticoagulated on Coumadin, CAD (coronary artery disease), Diabetes mellitus, type 2 ([place]) (09/21/92), Elbow fracture, right (2014), Gout (04/22/2003), HFrEF (heart failure with reduced ejection fraction) ([place]), History of colon polyps (10/26/02), History of GI bleed, History of tobacco abuse, Hyperlipidemia (05/22/01), Hypertension, Hypogonadism male, Knee pain, right, Mitral regurgitation, Nonischemic cardiomyopathy ([place]), Permanent atrial fibrillation ([place]) (01/20/2003), and TIA (transient ischemic attack).  PSH:    Past Surgical History:  Procedure Laterality Date  . APPENDECTOMY    . BUNIONECTOMY Right 02/2004   R MTP  . [place]   normal, [facility]  . CARDIAC CATHETERIZATION  10/14   [facility]- 50% mid LCx, 40% prox RCA  . CYST  EXCISION  08/07/2013   head  . TONSILLECTOMY      Current Outpatient Medications  Medication Sig Dispense Refill  . allopurinol (ZYLOPRIM) 100 MG tablet TAKE 1 TABLET BY MOUTH EVERY DAY 90 tablet 3  . allopurinol (ZYLOPRIM) 300 MG tablet TAKE 1 TABLET BY MOUTH EVERY DAY 90  tablet 3  . Ascorbic Acid (VITAMIN C) 1000 MG tablet Take 1,000 mg by mouth daily.    . brimonidine (ALPHAGAN) 0.2 % ophthalmic solution Place into both eyes 2 (two) times daily.    . budesonide-formoterol (SYMBICORT) 160-4.5 MCG/ACT inhaler Inhale 2 puffs into the lungs 2 (two) times daily. Rinse after use. 1 Inhaler 3  . carvedilol (COREG) 12.5 MG tablet TAKE 1 TABLET (12.5 MG TOTAL) BY MOUTH 2 (TWO) TIMES DAILY WITH A MEAL. 180 tablet 3  . furosemide (LASIX) 40 MG tablet TAKE 1/2 TABLET BY MOUTH EVERY DAY AS NEEDED. 90 tablet 0  . glucose blood (ONE TOUCH TEST STRIPS) test strip Use once daily 100 each 3  . ipratropium (ATROVENT) 0.06 % nasal spray INHALE 2 SPRAYS IN EACH NOSTRIL DAILY 15 mL 5  . latanoprost (XALATAN) 0.005 % ophthalmic solution Place 1 drop into both eyes at bedtime.    . simvastatin (ZOCOR) 20 MG tablet TAKE 1 TABLET BY MOUTH ONCE DAILY 90 tablet 1  . warfarin (COUMADIN) 5 MG tablet TAKE 1/2-1 TABLET BY MOUTH DAILY AS DIRECTED 100 tablet 1   No current facility-administered medications for this visit.      Allergies:   Lisinopril and Varenicline tartrate   Social History:  The patient  reports that he quit smoking about 4 years ago. His smoking use included cigarettes and pipe. He has a 11.00 pack-year smoking history. he has never used smokeless tobacco. He reports that he drinks about 2.4 oz of alcohol per week. He reports that he does not use drugs.   Family History:   family history includes Colon cancer in his daughter; Diabetes in his father; Healthy in his son; Other in his mother.    Review of Systems: Review of Systems  Constitutional: Negative.   Respiratory: Negative.   Cardiovascular: Negative.   Gastrointestinal: Negative.   Musculoskeletal: Negative.   Neurological: Negative.   Psychiatric/Behavioral: Negative.   All other systems reviewed and are negative.    PHYSICAL EXAM: VS:  BP 120/60 (BP Location: Left Arm, Patient Position: Sitting, Cuff  Size: Normal)   Pulse 82   Ht 5\' 9"  (1.753 m)   Wt 187 lb 12 oz (85.2 kg)   BMI 27.73 kg/m  , BMI Body mass index is 27.73 kg/m. GEN: Well nourished, well developed, in no acute distress  HEENT: normal  Neck: no JVD, carotid bruits, or masses Cardiac: RRR; no murmurs, rubs, or gallops,no edema  Respiratory:  clear to auscultation bilaterally, normal work of breathing GI: soft, nontender, nondistended, + BS MS: no deformity or atrophy  Skin: warm and dry, no rash Neuro:  Strength and sensation are intact Psych: euthymic mood, full affect    Recent Labs: 04/01/2017: ALT 13; Hemoglobin 13.1; Platelets 180.0 07/01/2017: BUN 16; Creatinine, Ser 1.16; Potassium 4.8; Sodium 135    Lipid Panel Lab Results  Component Value Date   CHOL 131 05/05/2016   HDL 60.90 05/05/2016   LDLCALC 51 05/05/2016   TRIG 93.0 05/05/2016      Wt Readings from Last 3 Encounters:  08/24/17 187 lb 12 oz (85.2 kg)  04/01/17 185 lb 8 oz (84.1 kg)  03/25/17  185 lb 4 oz (84 kg)       ASSESSMENT AND PLAN:  Chronic atrial fibrillation (HCC) - Plan: EKG 12-Lead Tolerating warfarin, no recent falls, bleeding Stable  Mixed hyperlipidemia Cholesterol is at goal on the current lipid regimen. No changes to the medications were made. Continue simvastatin, stable  Systolic CHF, chronic ([place])  depressed ejection fraction, last evaluated 2015. Previously declined echocardiogram Unable to exclude alcohol mediated cardiomyopathy.  Recommended alcohol cessation   ACE inhibitor, ARB previously held for hypotension  Paroxysmal SVT (supraventricular tachycardia) (HCC) Remote history of SVT, none recently Discussed adenosine administration if he has recurrent symptoms Stable  Coronary artery disease involving native coronary artery of native heart without angina pectoris Currently with no symptoms of angina. No further workup at this time. Continue current medication regimen.  Type 2 diabetes mellitus  with complication, without long-term current use of insulin (HCC) Hemoglobin A1c well controlled, weight is stable Recommended exercise program  Alcohol abuse Recommended alcohol cessation, likely contributing to dilated cardiomyopathy Recently stopped smoking   Total encounter time more than 25 minutes  Greater than 50% was spent in counseling and coordination of care with the patient   Disposition:   F/U  12 months   Orders Placed This Encounter  Procedures  . EKG 12-Lead     Signed, Esmond Plants, M.D., Ph.D. 08/24/2017  [place], [place]

## 2017-08-24 NOTE — Patient Instructions (Addendum)

## 2017-09-03 ENCOUNTER — Other Ambulatory Visit: Payer: Self-pay | Admitting: Family Medicine

## 2017-09-03 DIAGNOSIS — Z961 Presence of intraocular lens: Secondary | ICD-10-CM | POA: Diagnosis not present

## 2017-09-03 DIAGNOSIS — H401231 Low-tension glaucoma, bilateral, mild stage: Secondary | ICD-10-CM | POA: Diagnosis not present

## 2017-09-03 NOTE — Telephone Encounter (Signed)
Electronic refill request. Brimonidine 0.2% Ophthalmic Solution Last office visit:   04/01/17 Last Filled:   ? Please advise.

## 2017-09-04 NOTE — Telephone Encounter (Signed)
Need to go to his eye doctor/rxing MD.  Thanks.

## 2017-09-07 ENCOUNTER — Ambulatory Visit: Payer: Medicare Other

## 2017-09-09 ENCOUNTER — Ambulatory Visit (INDEPENDENT_AMBULATORY_CARE_PROVIDER_SITE_OTHER): Payer: Medicare Other | Admitting: General Practice

## 2017-09-09 DIAGNOSIS — I482 Chronic atrial fibrillation, unspecified: Secondary | ICD-10-CM

## 2017-09-09 DIAGNOSIS — Z7901 Long term (current) use of anticoagulants: Secondary | ICD-10-CM | POA: Diagnosis not present

## 2017-09-09 LAB — POCT INR: INR: 3.3

## 2017-09-09 NOTE — Patient Instructions (Addendum)
Pre visit review using our clinic review tool, if applicable. No additional management support is needed unless otherwise documented below in the visit note.  kip dosage today (12/20) and then continue taking 1 pill (5mg ) daily EXCEPT for 1/2 pill (2.5mg ) on Saturdays.  Recheck in 3-4 weeks.

## 2017-09-10 NOTE — Progress Notes (Signed)
Agree. Thanks

## 2017-09-30 ENCOUNTER — Other Ambulatory Visit (INDEPENDENT_AMBULATORY_CARE_PROVIDER_SITE_OTHER): Payer: Medicare Other

## 2017-09-30 DIAGNOSIS — E119 Type 2 diabetes mellitus without complications: Secondary | ICD-10-CM

## 2017-09-30 LAB — HEMOGLOBIN A1C: Hgb A1c MFr Bld: 6.1 % (ref 4.6–6.5)

## 2017-10-02 ENCOUNTER — Other Ambulatory Visit: Payer: Self-pay | Admitting: Family Medicine

## 2017-10-05 ENCOUNTER — Ambulatory Visit: Payer: Medicare Other

## 2017-10-07 ENCOUNTER — Ambulatory Visit (INDEPENDENT_AMBULATORY_CARE_PROVIDER_SITE_OTHER): Payer: Medicare Other | Admitting: General Practice

## 2017-10-07 DIAGNOSIS — Z7901 Long term (current) use of anticoagulants: Secondary | ICD-10-CM

## 2017-10-07 LAB — POCT INR: INR: 2.1

## 2017-10-07 NOTE — Patient Instructions (Addendum)
Pre visit review using our clinic review tool, if applicable. No additional management support is needed unless otherwise documented below in the visit note.  Continue taking 1 pill (5mg ) daily EXCEPT for 1/2 pill (2.5mg ) on Saturdays.  Recheck in 3-4 weeks.

## 2017-10-10 NOTE — Progress Notes (Signed)
Agree. Thanks

## 2017-10-24 ENCOUNTER — Other Ambulatory Visit: Payer: Self-pay | Admitting: Family Medicine

## 2017-11-02 ENCOUNTER — Ambulatory Visit (INDEPENDENT_AMBULATORY_CARE_PROVIDER_SITE_OTHER): Payer: Medicare Other

## 2017-11-02 ENCOUNTER — Other Ambulatory Visit: Payer: Self-pay | Admitting: Family Medicine

## 2017-11-02 DIAGNOSIS — Z7901 Long term (current) use of anticoagulants: Secondary | ICD-10-CM

## 2017-11-02 DIAGNOSIS — I482 Chronic atrial fibrillation, unspecified: Secondary | ICD-10-CM

## 2017-11-02 LAB — POCT INR: INR: 1.9

## 2017-11-02 NOTE — Progress Notes (Signed)
Agree. Thanks

## 2017-11-02 NOTE — Patient Instructions (Addendum)
INR today 1.9  Take 1.5 pills (7.5mg ) today 2/12 only and then Continue taking 1 pill (5mg ) daily EXCEPT for 1/2 pill (2.5mg ) on Saturdays.  Recheck in 3-4 weeks.   Patient is doing well without any changes to health or medications.  He does report eating excess collard greens yesterday which likely contributed to his slight subtherapeutic reading today.  Otherwise, patient is doing well without any concerns.

## 2017-11-03 NOTE — Telephone Encounter (Signed)
Patient is compliant with coumadin management will refill X 6 months.   

## 2017-11-05 ENCOUNTER — Other Ambulatory Visit: Payer: Self-pay | Admitting: Family Medicine

## 2017-11-13 ENCOUNTER — Other Ambulatory Visit: Payer: Self-pay | Admitting: Family Medicine

## 2017-11-30 ENCOUNTER — Ambulatory Visit (INDEPENDENT_AMBULATORY_CARE_PROVIDER_SITE_OTHER): Payer: Medicare Other

## 2017-11-30 DIAGNOSIS — I482 Chronic atrial fibrillation, unspecified: Secondary | ICD-10-CM

## 2017-11-30 DIAGNOSIS — Z7901 Long term (current) use of anticoagulants: Secondary | ICD-10-CM

## 2017-11-30 LAB — POCT INR: INR: 1.9

## 2017-11-30 NOTE — Patient Instructions (Signed)
INR today: 1.9  Take 1.5 pills (7.5mg ) today 3/12 only and then increase dose to taking 1 pill (5mg ) daily.  Recheck in 3-4 weeks.   Patient denies any changes to diet, health or medications.  He has been reporting increased bruising that he is concerned about; however, he has been staying in borderline subtherapeutic range.  He would like to discuss with MD if he is a candidate for either Eliquis or Xarelto.  I will discuss further with MD, but for now patient is to follow above dosing.   Patient verbalizes understanding of all instructions given today and understands risks associated with a subtherapeutic level.  He will go to ER if any concerns develop.

## 2017-12-01 NOTE — Progress Notes (Signed)
Agree. Thanks

## 2017-12-03 ENCOUNTER — Telehealth: Payer: Self-pay

## 2017-12-03 NOTE — Telephone Encounter (Signed)
Guidelines actually prefer Eliquis 5 twice daily over warfarin Would recommend he switch to the Eliquis 5 twice daily

## 2017-12-03 NOTE — Telephone Encounter (Signed)
Patient has reported increasing concerns and anxiety at recent [facility] clinic visits over the amount and ease of bruising he has developed while taking Coumadin.  He has not had an abnormal amount of bruising per clinical standards but it is very concerning to patient.  He would like to know if he would be a candidate for either Eliquis or Xarelto based on his health history.  I do not see any glaring concerns on the surface but will need to defer to PCP and/or cardiologist to make the more informed judgement call.  He does not have an artificial heart valve or any recent acute PE/DVT events.  Last Liver function test appeared normal.  He does have some dx renal insufficiency but appears mild at this point.  Will defer to PCP and cardiology.    Patient and I did discuss concerns for cost of these medications.  If he is approved for the med, he will verify cost with his insurance company before moving forward.   Dr. Louie Boston. Rockey Situ Please let me know your opinion for a possible transition.  Thanks.

## 2017-12-05 NOTE — Telephone Encounter (Signed)
I agree, see below.  Reasonable for him to verify cost with his insurance company before moving forward.  Thanks.

## 2017-12-06 NOTE — Telephone Encounter (Signed)
Patient advised.

## 2017-12-21 ENCOUNTER — Ambulatory Visit (INDEPENDENT_AMBULATORY_CARE_PROVIDER_SITE_OTHER): Payer: Medicare Other

## 2017-12-21 DIAGNOSIS — I482 Chronic atrial fibrillation, unspecified: Secondary | ICD-10-CM

## 2017-12-21 DIAGNOSIS — Z7901 Long term (current) use of anticoagulants: Secondary | ICD-10-CM

## 2017-12-21 LAB — POCT INR: INR: 2.4

## 2017-12-21 NOTE — Patient Instructions (Signed)
INR today: 2.4  Continue to take 1 pill (5mg ) daily.  Recheck in 4 weeks.   Patient and I discussed option of switching to Eliquis (okay per Dr. Rockey Situ and Dr. Damita Dunnings).  At this time, patient has decided against a change due to cost concerns.  He will investigate further with his insurance and let me know if he changes his mind.    Patient verbalizes understanding of all instructions given today.

## 2017-12-22 NOTE — Progress Notes (Signed)
Agree. Thanks

## 2017-12-29 ENCOUNTER — Other Ambulatory Visit: Payer: Self-pay | Admitting: Family Medicine

## 2017-12-29 NOTE — Telephone Encounter (Signed)
Electronic refill Last office visit 04/01/17 Last refill 10/04/17 #90/1 See allergy/contraindication

## 2017-12-30 ENCOUNTER — Encounter: Payer: Self-pay | Admitting: *Deleted

## 2017-12-30 NOTE — Telephone Encounter (Signed)
Letter mailed

## 2017-12-30 NOTE — Telephone Encounter (Signed)
Sent. Okay to continue.  Needs yearly visit with labs ahead of time, this summer.  Thanks.

## 2018-01-03 ENCOUNTER — Other Ambulatory Visit: Payer: Self-pay | Admitting: Family Medicine

## 2018-01-18 ENCOUNTER — Ambulatory Visit (INDEPENDENT_AMBULATORY_CARE_PROVIDER_SITE_OTHER): Payer: Medicare Other

## 2018-01-18 DIAGNOSIS — I482 Chronic atrial fibrillation, unspecified: Secondary | ICD-10-CM

## 2018-01-18 DIAGNOSIS — Z7901 Long term (current) use of anticoagulants: Secondary | ICD-10-CM

## 2018-01-18 LAB — POCT INR: INR: 2.7

## 2018-01-18 NOTE — Patient Instructions (Signed)
INR today: 2.7  Continue to take 1 pill (5mg ) daily.  Recheck in 5 weeks.   Patient is doing well without any concerns and no recent changes to diet, health or medications.  Patient verbalizes understanding of all instructions given today.

## 2018-01-19 NOTE — Progress Notes (Signed)
Agree. Thanks

## 2018-01-20 ENCOUNTER — Other Ambulatory Visit: Payer: Self-pay | Admitting: Family Medicine

## 2018-01-20 DIAGNOSIS — R739 Hyperglycemia, unspecified: Secondary | ICD-10-CM

## 2018-01-20 DIAGNOSIS — Z7901 Long term (current) use of anticoagulants: Secondary | ICD-10-CM

## 2018-01-20 DIAGNOSIS — M109 Gout, unspecified: Secondary | ICD-10-CM

## 2018-01-20 DIAGNOSIS — I10 Essential (primary) hypertension: Secondary | ICD-10-CM

## 2018-01-20 DIAGNOSIS — I482 Chronic atrial fibrillation, unspecified: Secondary | ICD-10-CM

## 2018-01-21 DIAGNOSIS — M7541 Impingement syndrome of right shoulder: Secondary | ICD-10-CM | POA: Diagnosis not present

## 2018-01-21 DIAGNOSIS — M7542 Impingement syndrome of left shoulder: Secondary | ICD-10-CM | POA: Diagnosis not present

## 2018-01-21 DIAGNOSIS — R0781 Pleurodynia: Secondary | ICD-10-CM | POA: Diagnosis not present

## 2018-01-25 ENCOUNTER — Other Ambulatory Visit: Payer: Medicare Other

## 2018-01-25 DIAGNOSIS — M7541 Impingement syndrome of right shoulder: Secondary | ICD-10-CM | POA: Diagnosis not present

## 2018-01-25 DIAGNOSIS — M7542 Impingement syndrome of left shoulder: Secondary | ICD-10-CM | POA: Diagnosis not present

## 2018-01-26 ENCOUNTER — Other Ambulatory Visit (INDEPENDENT_AMBULATORY_CARE_PROVIDER_SITE_OTHER): Payer: Medicare Other

## 2018-01-26 DIAGNOSIS — R739 Hyperglycemia, unspecified: Secondary | ICD-10-CM | POA: Diagnosis not present

## 2018-01-26 DIAGNOSIS — Z7901 Long term (current) use of anticoagulants: Secondary | ICD-10-CM

## 2018-01-26 DIAGNOSIS — I10 Essential (primary) hypertension: Secondary | ICD-10-CM | POA: Diagnosis not present

## 2018-01-26 DIAGNOSIS — M109 Gout, unspecified: Secondary | ICD-10-CM | POA: Diagnosis not present

## 2018-01-26 DIAGNOSIS — I482 Chronic atrial fibrillation, unspecified: Secondary | ICD-10-CM

## 2018-01-26 LAB — CBC WITH DIFFERENTIAL/PLATELET
Basophils Absolute: 0 10*3/uL (ref 0.0–0.1)
Basophils Relative: 0.1 % (ref 0.0–3.0)
Eosinophils Absolute: 0 10*3/uL (ref 0.0–0.7)
Eosinophils Relative: 0.2 % (ref 0.0–5.0)
HCT: 40 % (ref 39.0–52.0)
Hemoglobin: 13.4 g/dL (ref 13.0–17.0)
Lymphocytes Relative: 11.9 % — ABNORMAL LOW (ref 12.0–46.0)
Lymphs Abs: 0.7 10*3/uL (ref 0.7–4.0)
MCHC: 33.5 g/dL (ref 30.0–36.0)
MCV: 96.1 fl (ref 78.0–100.0)
Monocytes Absolute: 0.6 10*3/uL (ref 0.1–1.0)
Monocytes Relative: 10.6 % (ref 3.0–12.0)
Neutro Abs: 4.6 10*3/uL (ref 1.4–7.7)
Neutrophils Relative %: 77.2 % — ABNORMAL HIGH (ref 43.0–77.0)
Platelets: 169 10*3/uL (ref 150.0–400.0)
RBC: 4.16 Mil/uL — ABNORMAL LOW (ref 4.22–5.81)
RDW: 15.7 % — ABNORMAL HIGH (ref 11.5–15.5)
WBC: 6 10*3/uL (ref 4.0–10.5)

## 2018-01-26 LAB — COMPREHENSIVE METABOLIC PANEL
ALT: 21 U/L (ref 0–53)
AST: 19 U/L (ref 0–37)
Albumin: 3.6 g/dL (ref 3.5–5.2)
Alkaline Phosphatase: 38 U/L — ABNORMAL LOW (ref 39–117)
BUN: 31 mg/dL — ABNORMAL HIGH (ref 6–23)
CO2: 32 mEq/L (ref 19–32)
Calcium: 8.8 mg/dL (ref 8.4–10.5)
Chloride: 96 mEq/L (ref 96–112)
Creatinine, Ser: 1.22 mg/dL (ref 0.40–1.50)
GFR: 60.78 mL/min (ref >60.00)
Glucose, Bld: 147 mg/dL — ABNORMAL HIGH (ref 70–99)
Potassium: 4.7 mEq/L (ref 3.5–5.1)
Sodium: 134 mEq/L — ABNORMAL LOW (ref 135–145)
Total Bilirubin: 1.2 mg/dL (ref 0.2–1.2)
Total Protein: 5.8 g/dL — ABNORMAL LOW (ref 6.0–8.3)

## 2018-01-26 LAB — LIPID PANEL
Cholesterol: 133 mg/dL (ref 0–200)
HDL: 72.3 mg/dL (ref >39.00)
LDL Cholesterol: 52 mg/dL (ref 0–99)
NonHDL: 60.49
Total CHOL/HDL Ratio: 2
Triglycerides: 44 mg/dL (ref 0.0–149.0)
VLDL: 8.8 mg/dL (ref 0.0–40.0)

## 2018-01-26 LAB — HEMOGLOBIN A1C: Hgb A1c MFr Bld: 6.3 % (ref 4.6–6.5)

## 2018-01-26 LAB — URIC ACID: Uric Acid, Serum: 3.2 mg/dL — ABNORMAL LOW (ref 4.0–7.8)

## 2018-02-01 ENCOUNTER — Encounter: Payer: Self-pay | Admitting: Family Medicine

## 2018-02-01 ENCOUNTER — Ambulatory Visit (INDEPENDENT_AMBULATORY_CARE_PROVIDER_SITE_OTHER): Payer: Medicare Other | Admitting: Family Medicine

## 2018-02-01 VITALS — BP 100/60 | HR 86 | Temp 97.6°F | Ht 68.0 in | Wt 185.2 lb

## 2018-02-01 DIAGNOSIS — I482 Chronic atrial fibrillation, unspecified: Secondary | ICD-10-CM

## 2018-02-01 DIAGNOSIS — I10 Essential (primary) hypertension: Secondary | ICD-10-CM

## 2018-02-01 DIAGNOSIS — Y92009 Unspecified place in unspecified non-institutional (private) residence as the place of occurrence of the external cause: Secondary | ICD-10-CM | POA: Diagnosis not present

## 2018-02-01 DIAGNOSIS — Z0000 Encounter for general adult medical examination without abnormal findings: Secondary | ICD-10-CM | POA: Diagnosis not present

## 2018-02-01 DIAGNOSIS — Z8639 Personal history of other endocrine, nutritional and metabolic disease: Secondary | ICD-10-CM | POA: Diagnosis not present

## 2018-02-01 DIAGNOSIS — M109 Gout, unspecified: Secondary | ICD-10-CM | POA: Diagnosis not present

## 2018-02-01 DIAGNOSIS — E782 Mixed hyperlipidemia: Secondary | ICD-10-CM | POA: Diagnosis not present

## 2018-02-01 DIAGNOSIS — Z7189 Other specified counseling: Secondary | ICD-10-CM

## 2018-02-01 DIAGNOSIS — W19XXXD Unspecified fall, subsequent encounter: Secondary | ICD-10-CM | POA: Diagnosis not present

## 2018-02-01 MED ORDER — ALLOPURINOL 100 MG PO TABS: 100.0000 mg | ORAL_TABLET | Freq: Every day | ORAL | 3 refills | Status: DC

## 2018-02-01 MED ORDER — SIMVASTATIN 20 MG PO TABS: 20.0000 mg | ORAL_TABLET | Freq: Every day | ORAL | 3 refills | Status: DC

## 2018-02-01 MED ORDER — BUDESONIDE-FORMOTEROL FUMARATE 160-4.5 MCG/ACT IN AERO: INHALATION_SPRAY | RESPIRATORY_TRACT | 3 refills | Status: DC

## 2018-02-01 MED ORDER — TRAMADOL HCL 50 MG PO TABS: ORAL_TABLET | ORAL | Status: DC

## 2018-02-01 MED ORDER — CARVEDILOL 12.5 MG PO TABS: 12.5000 mg | ORAL_TABLET | Freq: Two times a day (BID) | ORAL | 3 refills | Status: DC

## 2018-02-01 MED ORDER — FUROSEMIDE 40 MG PO TABS: ORAL_TABLET | ORAL | 1 refills | Status: DC

## 2018-02-01 MED ORDER — ALLOPURINOL 300 MG PO TABS: 300.0000 mg | ORAL_TABLET | Freq: Every day | ORAL | 3 refills | Status: DC

## 2018-02-01 NOTE — Patient Instructions (Signed)
Don't change your meds for now.  Update me as needed.  Take care.  Glad to see you.   

## 2018-02-02 DIAGNOSIS — W19XXXA Unspecified fall, initial encounter: Secondary | ICD-10-CM | POA: Insufficient documentation

## 2018-02-02 DIAGNOSIS — Y92009 Unspecified place in unspecified non-institutional (private) residence as the place of occurrence of the external cause: Secondary | ICD-10-CM

## 2018-02-02 DIAGNOSIS — I10 Essential (primary) hypertension: Secondary | ICD-10-CM | POA: Insufficient documentation

## 2018-02-02 NOTE — Assessment & Plan Note (Signed)
Fall cautions discussed with patient.  Would not stop anticoagulation at this point.  He declined referral to PT for balance training.  Labs discussed with patient.  Not tachy at exam.  Okay for outpatient follow-up.  He agrees.

## 2018-02-02 NOTE — Assessment & Plan Note (Signed)
Declined physical therapy referral.  Routine cautions given.  He can use tramadol as needed for pain.  See med list.  Update me as needed.

## 2018-02-02 NOTE — Assessment & Plan Note (Signed)
At goal.  Continue as is.  He agrees.

## 2018-02-02 NOTE — Assessment & Plan Note (Signed)
Not diabetic by A1c.  Discussed with patient about labs and diet.  He agrees.

## 2018-02-02 NOTE — Progress Notes (Signed)
I have personally reviewed the Medicare Annual Wellness questionnaire and have noted 1. The patient's medical and social history 2. Their use of alcohol, tobacco or illicit drugs 3. Their current medications and supplements 4. The patient's functional ability including ADL's, fall risks, home safety risks and hearing or visual             impairment. 5. Diet and physical activities 6. Evidence for depression or mood disorders  The patients weight, height, BMI have been recorded in the chart and visual acuity is per eye clinic.  I have made referrals, counseling and provided education to the patient based review of the above and I have provided the pt with a written personalized care plan for preventive services.  Provider list updated- see scanned forms.  Routine anticipatory guidance given to patient.  See health maintenance. The possibility exists that previously documented standard health maintenance information may have been brought forward from a previous encounter into this note.  If needed, that same information has been updated to reflect the current situation based on today's encounter.    Flu 2018 Shingles prev done PNA UTD Tetanus 2018 Colonoscopy deferred given age.  Prostate cancer screening deferred given age.  He agrees.   Advance directive-wife designated if patient were incapacitated. Cognitive function addressed- see scanned forms- and if abnormal then additional documentation follows.   Hypertension:    Using medication without problems or lightheadedness: yes Chest pain with exertion:no Edema:no Short of breath: no Labs d/w.   AF.  Pt anticoagulated.  No heart racing.  No chest pain.  Compliant with medication.  Gout.  No flares.  Labs discussed with patient, uric acid goal.  No adverse effect on medication.  Elevated Cholesterol: Using medications without problems:yes Muscle aches: Occasional cramping but no diffuse myalgias. Diet compliance: yes Exercise:  encouraged.   He had fallen recently.  His ribs are still sore.  He is taken tramadol occasionally for that.  We talked about taking 2 tablets at night if he was having difficulty sleeping from pain.  He declined physical therapy referral at this point.  Fall cautions discussed with patient.  The fall was mechanical.  He was not orthostatic.  Hyperglycemia discussed with patient.  Not diabetic by A1c.  Discussed with patient about cutting out some carbohydrates from his diet.  We can recheck periodically.  He agrees.  PMH and SH reviewed  Meds, vitals, and allergies reviewed.   ROS: Per HPI.  Unless specifically indicated otherwise in HPI, the patient denies:  General: fever. Eyes: acute vision changes ENT: sore throat Cardiovascular: chest pain Respiratory: SOB GI: vomiting GU: dysuria Musculoskeletal: acute back pain Derm: acute rash Neuro: acute motor dysfunction Psych: worsening mood Endocrine: polydipsia Heme: bleeding Allergy: hayfever  GEN: nad, alert and oriented HEENT: mucous membranes moist NECK: supple w/o LA CV: IRR PULM: ctab, no inc wob, R sided lower ribs ttp but not sore with a deep breath ABD: soft, +bs EXT: no edema SKIN: no acute rash

## 2018-02-02 NOTE — Assessment & Plan Note (Signed)
Continue statin.  Continue work on diet and exercise.  Labs discussed with patient.  He agrees. 

## 2018-02-02 NOTE — Assessment & Plan Note (Signed)
Controlled.  No chest pain.  Discussed with patient about labs.  No change in meds.

## 2018-02-02 NOTE — Assessment & Plan Note (Signed)
  Flu 2018 Shingles prev done PNA UTD Tetanus 2018 Colonoscopy deferred given age.  Prostate cancer screening deferred given age.  He agrees.   Advance directive-wife designated if patient were incapacitated. Cognitive function addressed- see scanned forms- and if abnormal then additional documentation follows.

## 2018-02-02 NOTE — Assessment & Plan Note (Signed)
Advance directive- wife designated if patient were incapacitated.  

## 2018-02-07 ENCOUNTER — Ambulatory Visit: Payer: Self-pay

## 2018-02-07 NOTE — Telephone Encounter (Signed)
Wife advised. 

## 2018-02-07 NOTE — Telephone Encounter (Signed)
Pt has appt 02/08/18 at 11:45 with Dr Damita Dunnings.

## 2018-02-07 NOTE — Telephone Encounter (Signed)
Have him check BP at home and don't start any extra BP meds.  Thanks.

## 2018-02-07 NOTE — Telephone Encounter (Signed)
Pt. Reports having dizziness when he changes positions from sitting to standing x 1 week. Wife reports he fell Saturday - no injuries. Denies any chest pain or shortness of breath. Pt. Thinks his blood pressure is running low. States he had some "old losartan I had. I took some and I felt better." Instructed pt. Not to start himself on old medication before he talks to the dr.Verbalizes understanding. No availability with Dr. Damita Dunnings today. Appointment made for tomorrow. Offered an appointment today with another provider, but pt. Only wants to see Dr. Damita Dunnings.  Reason for Disposition . [1] MODERATE dizziness (e.g., interferes with normal activities) AND [2] has NOT been evaluated by physician for this  (Exception: dizziness caused by heat exposure, sudden standing, or poor fluid intake)  Answer Assessment - Initial Assessment Questions 1. DESCRIPTION: "Describe your dizziness."     Gets dizzy when changing positions 2. LIGHTHEADED: "Do you feel lightheaded?" (e.g., somewhat faint, woozy, weak upon standing)     When changing positions. 3. VERTIGO: "Do you feel like either you or the room is spinning or tilting?" (i.e. vertigo)     No 4. SEVERITY: "How bad is it?"  "Do you feel like you are going to faint?" "Can you stand and walk?"   - MILD - walking normally   - MODERATE - interferes with normal activities (e.g., work, school)    - SEVERE - unable to stand, requires support to walk, feels like passing out now.      Moderate 5. ONSET:  "When did the dizziness begin?"     Last week 6. AGGRAVATING FACTORS: "Does anything make it worse?" (e.g., standing, change in head position)     Change in position 7. HEART RATE: "Can you tell me your heart rate?" "How many beats in 15 seconds?"  (Note: not all patients can do this)       It is ok 8. CAUSE: "What do you think is causing the dizziness?"     Maybe my BP 9. RECURRENT SYMPTOM: "Have you had dizziness before?" If so, ask: "When was the last time?"  "What happened that time?"     yES 10. OTHER SYMPTOMS: "Do you have any other symptoms?" (e.g., fever, chest pain, vomiting, diarrhea, bleeding)       No 11. PREGNANCY: "Is there any chance you are pregnant?" "When was your last menstrual period?"       No  Protocols used: DIZZINESS [facility]

## 2018-02-08 ENCOUNTER — Ambulatory Visit (INDEPENDENT_AMBULATORY_CARE_PROVIDER_SITE_OTHER): Payer: Medicare Other | Admitting: Family Medicine

## 2018-02-08 ENCOUNTER — Encounter: Payer: Self-pay | Admitting: Family Medicine

## 2018-02-08 DIAGNOSIS — I482 Chronic atrial fibrillation, unspecified: Secondary | ICD-10-CM

## 2018-02-08 MED ORDER — CARVEDILOL 12.5 MG PO TABS: 6.2500 mg | ORAL_TABLET | Freq: Two times a day (BID) | ORAL | Status: DC

## 2018-02-08 NOTE — Progress Notes (Signed)
His wife has noted that when he gets up from a chair, he'll move to a counter to prop up. This started after the visit- his wife noted it as a change.  Then on 02/04/18 his wife thought he was going to fall.  She put a chair behind him to keep him from falling.  He was able to sit down in the chair without hitting the ground.  No loss of consciousness.  02/05/18 "he looked bad" according to wife and then when to ground.  He didn't pass out.  He remembers the event.  He doesn't recall being lightheaded.    He took some left over losartan x2 days but not today.  He took the losartan after the above described events had already happened.  No CP.  No SZ activity.  He can feel the episodes coming on.    He is not taking tramadol.  It made him constipated and his rib pain is better in the meantime.  Meds, vitals, and allergies reviewed.   ROS: Per HPI unless specifically indicated in ROS section   GEN: nad, alert and oriented HEENT: mucous membranes moist NECK: supple w/o LA CV: IRR PULM: ctab, no inc wob ABD: soft, +bs EXT: no edema SKIN: no acute rash Not lightheaded on standing.

## 2018-02-08 NOTE — Patient Instructions (Signed)
Cut the carvedilol back to 1/2 tab twice a day.  Update me about how you feel next week.  I'll update cardiology in the meantime.  Take care.  Glad to see you.

## 2018-02-09 NOTE — Assessment & Plan Note (Signed)
He did not have chest pain.  He did not have syncope.  It sounds like he has positional issues.  Advised him not to take any more extra losartan.  My concern is that he is either getting hypotensive or relatively bradycardic with position change.  Decrease carvedilol down to 6.25 mg twice a day.  They will update me in about 1 week.  No chest pain.  Okay for outpatient follow-up.  I will update cardiology as an FYI.

## 2018-02-18 ENCOUNTER — Telehealth: Payer: Self-pay | Admitting: *Deleted

## 2018-02-18 NOTE — Telephone Encounter (Signed)
Spoke with patient and then he handed phone to his wife. Reviewed recommendations with her and she states that he feels better and has been doing good. She states that since he has been taking his medications twice a day he has been doing better. She had a very heavy accent and again I reviewed information to make sure she understood me. She continued to talk and repeating that he is doing much better now. He does have upcoming appointment with his providers and she denied any further questions or concerns at this time.

## 2018-02-18 NOTE — Telephone Encounter (Signed)
-----   Message from Minna Merritts, MD sent at 02/13/2018  1:32 PM EDT ----- Labs looked prerenal 2 weeks ago Triage Can we have him hold the lasix for now Drink more fluids TG  ----- Message ----- From: Tonia Ghent, MD Sent: 02/09/2018   9:54 AM To: Minna Merritts, MD

## 2018-02-20 ENCOUNTER — Telehealth: Payer: Self-pay | Admitting: Family Medicine

## 2018-02-20 NOTE — Telephone Encounter (Signed)
Patient is seeing you tomorrow.  Please verify that he cut the carvedilol back to 1/2 tab twice a day (6.25mg  tab BID).  Please let me know how he is feeling in terms of orthostasis and let me know about his blood pressure and pulse.  Thanks.

## 2018-02-21 ENCOUNTER — Ambulatory Visit (INDEPENDENT_AMBULATORY_CARE_PROVIDER_SITE_OTHER): Payer: Medicare Other | Admitting: Family Medicine

## 2018-02-21 ENCOUNTER — Encounter: Payer: Self-pay | Admitting: Family Medicine

## 2018-02-21 ENCOUNTER — Encounter: Payer: Self-pay | Admitting: *Deleted

## 2018-02-21 VITALS — BP 100/64 | HR 93 | Temp 97.9°F | Ht 68.0 in | Wt 182.5 lb

## 2018-02-21 DIAGNOSIS — I420 Dilated cardiomyopathy: Secondary | ICD-10-CM

## 2018-02-21 DIAGNOSIS — M25512 Pain in left shoulder: Secondary | ICD-10-CM | POA: Diagnosis not present

## 2018-02-21 DIAGNOSIS — M75100 Unspecified rotator cuff tear or rupture of unspecified shoulder, not specified as traumatic: Secondary | ICD-10-CM | POA: Diagnosis not present

## 2018-02-21 DIAGNOSIS — I482 Chronic atrial fibrillation, unspecified: Secondary | ICD-10-CM

## 2018-02-21 DIAGNOSIS — M25511 Pain in right shoulder: Secondary | ICD-10-CM | POA: Diagnosis not present

## 2018-02-21 NOTE — Progress Notes (Signed)
Dr. Frederico Hamman T. Darrnell Mangiaracina, MD, [facility] and Sports Medicine [place], [address] Phone: [phone] Fax: [phone]  02/21/2018  Patient: Benjamin Soto, MRN: [id number], DOB: [birth year], 82 y.o.  Primary Physician:  Tonia Ghent, MD   Chief Complaint  Patient presents with  . Shoulder Pain    Bilateral-Fall x 1 month ago-Seen at [employer]   Subjective:   Johnanthony Wilden is a 82 y.o. very pleasant male patient who presents with the following:  Patient has multiple medical problems including advanced heart failure with an ejection fraction of 20% as well as chronic A. fib, and chronic kidney disease among other chronic medical problems.  [place] 1 month ago and saw [facility], Dr. Sabra Heck as well as his PA.  While I do not have the x-rays available for my independent review, the patient and his wife were told that these were normal.  He had no fractures.  Has dropped his coreg, dizziness has improved.   BO PA Linton Rump took some films and had some. Had a follow-up with Dr. Sabra Heck and possible rehab. Also possibly another injury. No need for pain meds..They did do bilateral shoulder injections for the patient.  At this point his functionality is quite diminished, and according to his wife and himself he is having trouble getting dressed and lifting his arms as well as even some basic activities of daily living.  Past Medical History, Surgical History, Social History, Family History, Problem List, Medications, and Allergies have been reviewed and updated if relevant.  Patient Active Problem List   Diagnosis Date Noted  . HTN (hypertension) 02/02/2018  . Fall at home 02/02/2018  . Chronic atrial fibrillation ([place]) 07/27/2017  . Long term (current) use of anticoagulants 07/27/2017  . Advance care planning 09/28/2014  . Thrombocytopenia ([place]) 09/13/2013  . GI bleed 09/12/2013  . Transient amnesia 08/13/2013  . Congestive dilated  cardiomyopathy ([place]) 06/30/2013  . CAD (coronary artery disease) 06/09/2013  . Medicare annual wellness visit, subsequent 02/06/2013  . Systolic CHF, chronic ([place]) 10/22/2011  . Wheeze 10/07/2011  . Hematuria, microscopic 08/17/2011  . Erectile dysfunction 02/09/2011  . RENAL INSUFFICIENCY 02/18/2010  . ROSACEA 02/18/2010  . FOOT PAIN, BILATERAL 02/28/2009  . DRY EYE SYNDROME 01/09/2008  . Paroxysmal SVT (supraventricular tachycardia) ([place]) 01/11/2007  . Gout 04/22/2003  . Atrial fibrillation, chronic ([place]) 01/20/2003  . Hyperlipidemia 05/22/2001  . History of diabetes mellitus 09/21/1992    Past Medical History:  Diagnosis Date  . Alcohol abuse   . Anticoagulated on Coumadin   . CAD (coronary artery disease)    a. 06/2013 Cath: LM nl, LAD nl, LCX 39m, RCA 40p, EF 25-30%.  . Diabetes mellitus, type 2 ([place]) 09/21/92  . Elbow fracture, right 2014   "healed by itself"  . Gout 04/22/2003   per Dr Jefm Bryant  . HFrEF (heart failure with reduced ejection fraction) ([place])    a.  02/2014 Echo: EF 20-25%, diff HK; b. 04/2014 CPX test: primary cirulatory limitation 2/2 advanced CHF.  Marland Kitchen History of colon polyps 10/26/02   colonoscopy  . History of GI bleed    a. 08/2013 Seen by GI at [facility]- felt to be diverticular in origin. Resolved off Coumadin. Cleared to resume per GI.  Marland Kitchen History of tobacco abuse   . Hyperlipidemia 05/22/01  . Hypertension   . Hypogonadism male   . Knee pain, right    [facility], Dr Jefm Bryant with rheumatology  . Mitral regurgitation  a. 02/2014 Echo: mild to mod MR.  . Nonischemic cardiomyopathy ([place])    a. 06/2013 Cath: nonobs dzs, EF 20-25; b. 02/2014 Echo: EF 20-25%, diff HK, mild AI, mild to mod MR, mod dil LA, mildly reduced RV fxn, mildly dil RA, PASP wnl.  . Permanent atrial fibrillation ([place]) 01/20/2003   a. CHA2DS2VASc = 7-->coumadin.  Marland Kitchen TIA (transient ischemic attack)    a. 07/2013 Admitted w/ transient AMS->No stroke;  b. 08/2013 Carotid U/S: <50%  bilat ICA stenosis.    Past Surgical History:  Procedure Laterality Date  . APPENDECTOMY    . BUNIONECTOMY Right 02/2004   R MTP  . Colon   [facility]  . CARDIAC CATHETERIZATION  10/14   [facility]- 50% mid LCx, 40% prox RCA  . CYST EXCISION  08/07/2013   head  . TONSILLECTOMY      Social History   Socioeconomic History  . Marital status: Married    Spouse name: Not on file  . Number of children: 2  . Years of education: Not on file  . Highest education level: Not on file  Occupational History  . 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 Financial resource strain: Not on file  . Food insecurity:    Worry: Not on file    Inability: Not on file  . Transportation needs:    Medical: Not on file    Non-medical: Not on file  Tobacco Use  . Smoking status: Former Smoker    Packs/day: 0.25    Years: 44.00    Pack years: 11.00    Types: Cigarettes, Pipe    Last attempt to quit: 08/09/2013    Years since quitting: 4.5  . Smokeless tobacco: Never Used  Substance and Sexual Activity  . Alcohol use: Yes    Alcohol/week: 2.4 oz    Types: 4 Glasses of wine per week    Comment: 09/12/2013 "drinks 1-2 glasses of red wine daily, 2-3 times/wk"  . Drug use: No  . Sexual activity: Not Currently  Lifestyle  . Physical activity:    Days per week: Not on file    Minutes per session: Not on file  . Stress: Not on file  Relationships  . Social connections:    Talks on phone: Not on file    Gets together: Not on file    Attends religious service: Not on file    Active member of club or organization: Not on file    Attends meetings of clubs or organizations: Not on file    Relationship status: Not on file  . Intimate partner violence:    Fear of current or ex partner: Not on file    Emotionally abused: Not on file    Physically abused: Not on file    Forced  sexual activity: Not on file  Other Topics Concern  . Not on file  Social History Narrative   Married since 1965.  They have two healthy grown children.   Former smoker, quit 1995, 35 pyh, started back as of 2011 with pipe smoking    Family History  Problem Relation Age of Onset  . Diabetes Father   . Other Mother        Deceased  . Healthy Son   . Colon cancer Daughter        Cancer free  . Prostate cancer Neg Hx  Allergies  Allergen Reactions  . Lisinopril     REACTION: hyperkalemia at high dose  . Tramadol     constipation  . Varenicline Tartrate     REACTION: vivid dreams, nausea    Medication list reviewed and updated in full in [place].  GEN: No fevers, chills. Nontoxic. Primarily MSK c/o today. MSK: Detailed in the HPI GI: tolerating PO intake without difficulty Neuro: No numbness, parasthesias, or tingling associated. Otherwise the pertinent positives of the ROS are noted above.   Objective:   BP 100/64   Pulse 93   Temp 97.9 F (36.6 C) (Oral)   Ht 5\' 8"  (1.727 m)   Wt 182 lb 8 oz (82.8 kg)   BMI 27.75 kg/m    GEN: WDWN, NAD, Non-toxic, Alert & Oriented x 3 HEENT: Atraumatic, Normocephalic.  Ears and Nose: No external deformity. EXTR: No clubbing/cyanosis/edema NEURO: Normal gait.  PSYCH: Normally interactive. Conversant. Not depressed or anxious appearing.  Calm demeanor.    Bilateral shoulder exam.  Nontender along clavicles bilaterally.  Nontender along the [facility] joint bilaterally.  Nontender along the length of the humerus and the humeral head.  Patient has minimal to no tenderness at the bicipital groove.  Nontender throughout the length of the forearm as well.  Active range of motion is limited in abduction and flexion considerably.  Active abduction limited to approximately 70 to 75 degrees bilaterally.  Strength is 3+/5. Flexion is limited to 75 degrees bilaterally and strength is 3+/5. External range of motion, strength  4-/5. Internal range of motion, strength 4+/5.  Radiology: No results found.  Assessment and Plan:   Acute pain of both shoulders - Plan: Ambulatory referral to Physical Therapy  Tear of rotator cuff, unspecified laterality, unspecified tear extent, unspecified whether traumatic - Plan: Ambulatory referral to Physical Therapy  Congestive dilated cardiomyopathy ([place])  Atrial fibrillation, chronic ([place])  Challenging case.  By exam, the patient presents as if he likely has bilateral full-thickness versus partial thickness rotator cuff tears with possible involvement of multiple parts of the rotator cuff.  At the same time, he is nearly 82 years old has advanced heart failure, as well as coronary disease, A. fib, kidney disease, and multiple medical problems.  At this time is not really interested in operative management, and I think that the best course of action is to try to nonoperatively manage him to see if we can get him as functional as possible.  I gave him some rehab to work on with a modified moon protocol, and he is also going to start formal physical therapy with follow-up with me.  Follow-up: 2 mo  Orders Placed This Encounter  Procedures  . Ambulatory referral to Physical Therapy    Signed,  Frederico Hamman T. Oda Placke, MD   Allergies as of 02/21/2018      Reactions   Lisinopril    REACTION: hyperkalemia at high dose   Tramadol    constipation   Varenicline Tartrate    REACTION: vivid dreams, nausea      Medication List        Accurate as of 02/21/18 11:59 PM. Always use your most recent med list.          allopurinol 100 MG tablet Commonly known as:  ZYLOPRIM Take 1 tablet (100 mg total) by mouth daily.   allopurinol 300 MG tablet Commonly known as:  ZYLOPRIM Take 1 tablet (300 mg total) by mouth daily.   brimonidine 0.2 % ophthalmic solution Commonly  known as:  ALPHAGAN Place into both eyes 2 (two) times daily.   budesonide-formoterol 160-4.5 MCG/ACT  inhaler Commonly known as:  SYMBICORT INHALE 2 PUFFS INTO THE LUNGS 2 (TWO) TIMES DAILY. RINSE AFTER USE.   carvedilol 12.5 MG tablet Commonly known as:  COREG Take 0.5 tablets (6.25 mg total) by mouth 2 (two) times daily with a meal.   furosemide 40 MG tablet Commonly known as:  LASIX TAKE 1/2 TABLET BY MOUTH EVERY DAY AS NEEDED.   glucose blood test strip Commonly known as:  ONE TOUCH TEST STRIPS Use once daily   ipratropium 0.06 % nasal spray Commonly known as:  ATROVENT INHALE 2 SPRAYS IN EACH NOSTRIL DAILY   latanoprost 0.005 % ophthalmic solution Commonly known as:  XALATAN Place 1 drop into both eyes at bedtime.   simvastatin 20 MG tablet Commonly known as:  ZOCOR Take 1 tablet (20 mg total) by mouth daily.   vitamin C 1000 MG tablet Take 1,000 mg by mouth daily.   warfarin 5 MG tablet Commonly known as:  COUMADIN Take as directed by the anticoagulation clinic. If you are unsure how to take this medication, talk to your nurse or doctor. Original instructions:  TAKE ONE-HALF TO 1 TABLET BY MOUTH DAILY AS DIRECTED

## 2018-02-21 NOTE — Patient Instructions (Signed)
REFERRALS TO SPECIALISTS, SPECIAL TESTS (MRI, CT, ULTRASOUNDS)  MARION or  Anastasiya will help you. ASK CHECK-IN FOR HELP.  Specialist appointment times vary a great deal, based on their schedule / openings. -- Some specialists have very long wait times. (Example. Dermatology)    

## 2018-02-21 NOTE — Telephone Encounter (Signed)
Seen in office.

## 2018-02-22 ENCOUNTER — Ambulatory Visit (INDEPENDENT_AMBULATORY_CARE_PROVIDER_SITE_OTHER): Payer: Medicare Other

## 2018-02-22 DIAGNOSIS — I482 Chronic atrial fibrillation, unspecified: Secondary | ICD-10-CM

## 2018-02-22 DIAGNOSIS — Z7901 Long term (current) use of anticoagulants: Secondary | ICD-10-CM

## 2018-02-22 LAB — POCT INR: INR: 2.9 (ref 2.0–3.0)

## 2018-02-22 NOTE — Patient Instructions (Signed)
INR today: 2.9  Continue to take 1 pill (5mg) daily.  Recheck in 6 weeks.   Patient is doing well without any concerns and no recent changes to diet, health or medications.  Patient verbalizes understanding of all instructions given today.     

## 2018-02-24 DIAGNOSIS — M7541 Impingement syndrome of right shoulder: Secondary | ICD-10-CM | POA: Diagnosis not present

## 2018-02-24 DIAGNOSIS — M7542 Impingement syndrome of left shoulder: Secondary | ICD-10-CM | POA: Diagnosis not present

## 2018-02-24 DIAGNOSIS — M25511 Pain in right shoulder: Secondary | ICD-10-CM | POA: Diagnosis not present

## 2018-02-24 DIAGNOSIS — M25512 Pain in left shoulder: Secondary | ICD-10-CM | POA: Diagnosis not present

## 2018-02-26 NOTE — Progress Notes (Signed)
Agree. Thanks

## 2018-03-03 DIAGNOSIS — M25512 Pain in left shoulder: Secondary | ICD-10-CM | POA: Diagnosis not present

## 2018-03-03 DIAGNOSIS — M7541 Impingement syndrome of right shoulder: Secondary | ICD-10-CM | POA: Diagnosis not present

## 2018-03-03 DIAGNOSIS — M25511 Pain in right shoulder: Secondary | ICD-10-CM | POA: Diagnosis not present

## 2018-03-03 DIAGNOSIS — M7542 Impingement syndrome of left shoulder: Secondary | ICD-10-CM | POA: Diagnosis not present

## 2018-03-10 DIAGNOSIS — M25511 Pain in right shoulder: Secondary | ICD-10-CM | POA: Diagnosis not present

## 2018-03-10 DIAGNOSIS — M7541 Impingement syndrome of right shoulder: Secondary | ICD-10-CM | POA: Diagnosis not present

## 2018-03-10 DIAGNOSIS — M25512 Pain in left shoulder: Secondary | ICD-10-CM | POA: Diagnosis not present

## 2018-03-10 DIAGNOSIS — M7542 Impingement syndrome of left shoulder: Secondary | ICD-10-CM | POA: Diagnosis not present

## 2018-03-11 DIAGNOSIS — H04123 Dry eye syndrome of bilateral lacrimal glands: Secondary | ICD-10-CM | POA: Diagnosis not present

## 2018-03-22 DIAGNOSIS — M25511 Pain in right shoulder: Secondary | ICD-10-CM | POA: Diagnosis not present

## 2018-03-22 DIAGNOSIS — M7542 Impingement syndrome of left shoulder: Secondary | ICD-10-CM | POA: Diagnosis not present

## 2018-03-22 DIAGNOSIS — M25512 Pain in left shoulder: Secondary | ICD-10-CM | POA: Diagnosis not present

## 2018-03-22 DIAGNOSIS — M7541 Impingement syndrome of right shoulder: Secondary | ICD-10-CM | POA: Diagnosis not present

## 2018-04-05 ENCOUNTER — Ambulatory Visit (INDEPENDENT_AMBULATORY_CARE_PROVIDER_SITE_OTHER): Payer: Medicare Other

## 2018-04-05 DIAGNOSIS — I482 Chronic atrial fibrillation, unspecified: Secondary | ICD-10-CM

## 2018-04-05 DIAGNOSIS — Z7901 Long term (current) use of anticoagulants: Secondary | ICD-10-CM

## 2018-04-05 LAB — POCT INR: INR: 2.9 (ref 2.0–3.0)

## 2018-04-05 NOTE — Patient Instructions (Signed)
INR today: 2.9  Continue to take 1 pill (5mg ) daily.  Recheck in 6 weeks.   Patient is doing well without any concerns and no recent changes to diet, health or medications.  Patient verbalizes understanding of all instructions given today.

## 2018-04-06 NOTE — Progress Notes (Signed)
Agree. Thanks

## 2018-04-12 DIAGNOSIS — S46012D Strain of muscle(s) and tendon(s) of the rotator cuff of left shoulder, subsequent encounter: Secondary | ICD-10-CM | POA: Diagnosis not present

## 2018-04-12 DIAGNOSIS — M7542 Impingement syndrome of left shoulder: Secondary | ICD-10-CM | POA: Diagnosis not present

## 2018-04-12 DIAGNOSIS — S46011D Strain of muscle(s) and tendon(s) of the rotator cuff of right shoulder, subsequent encounter: Secondary | ICD-10-CM | POA: Diagnosis not present

## 2018-04-12 DIAGNOSIS — M7541 Impingement syndrome of right shoulder: Secondary | ICD-10-CM | POA: Diagnosis not present

## 2018-04-15 ENCOUNTER — Telehealth: Payer: Self-pay | Admitting: Family Medicine

## 2018-04-15 MED ORDER — APIXABAN 5 MG PO TABS: 5.0000 mg | ORAL_TABLET | Freq: Two times a day (BID) | ORAL | 2 refills | Status: DC

## 2018-04-15 NOTE — Telephone Encounter (Addendum)
Spoke with patient.  He is tired of all of the excess bruising he has while on the coumadin.  His INR has been therapeutic despite bruising.    Patient asks to switch now to Eliquis.  Dr. Rockey Situ recommended Eliquis 5mg  twice daily back in March when we began discussion around the transition.   Spoke with Dr. Damita Dunnings and he approves sending in the r/x and switching to Eliquis.   Patient will take last coumadin tonight and hold X 2 days and then begin Eliquis twice daily on Monday. Patient aware and verbalizes understanding.

## 2018-04-15 NOTE — Telephone Encounter (Signed)
Copied from [place] [id number]. Topic: Quick Communication - See Telephone Encounter >> Apr 15, 2018 12:10 PM Synthia Innocent wrote: CRM for notification. See Telephone encounter for: 04/15/18. Wife would like to speak with CMA regarding starting eliquis with his warfarin (COUMADIN) 5 MG tablet

## 2018-04-17 NOTE — Telephone Encounter (Signed)
Thanks. Agreed.  °

## 2018-04-25 ENCOUNTER — Telehealth: Payer: Self-pay

## 2018-04-25 ENCOUNTER — Encounter: Payer: Self-pay | Admitting: Family Medicine

## 2018-04-25 ENCOUNTER — Ambulatory Visit (INDEPENDENT_AMBULATORY_CARE_PROVIDER_SITE_OTHER): Payer: Medicare Other | Admitting: Family Medicine

## 2018-04-25 VITALS — BP 115/61 | HR 78 | Temp 97.4°F | Ht 68.0 in | Wt 184.8 lb

## 2018-04-25 DIAGNOSIS — M75100 Unspecified rotator cuff tear or rupture of unspecified shoulder, not specified as traumatic: Secondary | ICD-10-CM

## 2018-04-25 DIAGNOSIS — I482 Chronic atrial fibrillation, unspecified: Secondary | ICD-10-CM

## 2018-04-25 DIAGNOSIS — I420 Dilated cardiomyopathy: Secondary | ICD-10-CM

## 2018-04-25 DIAGNOSIS — M25511 Pain in right shoulder: Secondary | ICD-10-CM | POA: Diagnosis not present

## 2018-04-25 DIAGNOSIS — M25512 Pain in left shoulder: Secondary | ICD-10-CM | POA: Diagnosis not present

## 2018-04-25 NOTE — Progress Notes (Signed)
Dr. Frederico Hamman T. Tyjuan Demetro, MD, [facility] Primary [facility] and Sports Medicine [place], [address] Phone: [phone] Fax: [phone]  04/25/2018  Patient: Benjamin Soto, MRN: [id number], DOB: 1936-08-04, 82 y.o.  Primary Physician:  Tonia Ghent, MD   Chief Complaint  Patient presents with  . Shoulder Pain    Bilateral-Fall early Spring   Subjective:   Benjamin Soto is a 82 y.o. very pleasant male patient who presents with the following:  Patient has multiple medical problems including advanced heart failure with an ejection fraction of 20% as well as chronic A. fib, and chronic kidney disease among other chronic medical problems.  F/u B shoulders: fall in the spring  At that point, the patient had limited functionality on both of his shoulders and significant loss of range of motion and strength in all planes of movement.  He has been very compliant doing his physical therapy and is been making progress and his range of motion is much better actively, and his strength is much better.  Past Medical History, Surgical History, Social History, Family History, Problem List, Medications, and Allergies have been reviewed and updated if relevant.  Patient Active Problem List   Diagnosis Date Noted  . HTN (hypertension) 02/02/2018  . Fall at home 02/02/2018  . Chronic atrial fibrillation ([place]) 07/27/2017  . Long term (current) use of anticoagulants 07/27/2017  . Advance care planning 09/28/2014  . Thrombocytopenia ([place]) 09/13/2013  . GI bleed 09/12/2013  . Transient amnesia 08/13/2013  . Congestive dilated cardiomyopathy ([place]) 06/30/2013  . CAD (coronary artery disease) 06/09/2013  . Medicare annual wellness visit, subsequent 02/06/2013  . Systolic CHF, chronic ([place]) 10/22/2011  . Wheeze 10/07/2011  . Hematuria, microscopic 08/17/2011  . Erectile dysfunction 02/09/2011  . RENAL INSUFFICIENCY 02/18/2010  . ROSACEA 02/18/2010  . FOOT PAIN,  BILATERAL 02/28/2009  . DRY EYE SYNDROME 01/09/2008  . Paroxysmal SVT (supraventricular tachycardia) ([place]) 01/11/2007  . Gout 04/22/2003  . Atrial fibrillation, chronic ([place]) 01/20/2003  . Hyperlipidemia 05/22/2001  . History of diabetes mellitus 09/21/1992    Past Medical History:  Diagnosis Date  . Alcohol abuse   . Anticoagulated on Coumadin   . CAD (coronary artery disease)    a. 06/2013 Cath: LM nl, LAD nl, LCX 54m, RCA 40p, EF 25-30%.  . Diabetes mellitus, type 2 ([place]) 09/21/92  . Elbow fracture, right 2014   "healed by itself"  . Gout 04/22/2003   per Dr Jefm Bryant  . HFrEF (heart failure with reduced ejection fraction) ([place])    a.  02/2014 Echo: EF 20-25%, diff HK; b. 04/2014 CPX test: primary cirulatory limitation 2/2 advanced CHF.  Marland Kitchen History of colon polyps 10/26/02   colonoscopy  . History of GI bleed    a. 08/2013 Seen by GI at [facility]- felt to be diverticular in origin. Resolved off Coumadin. Cleared to resume per GI.  Marland Kitchen History of tobacco abuse   . Hyperlipidemia 05/22/01  . Hypertension   . Hypogonadism male   . Knee pain, right    [facility], Dr Jefm Bryant with rheumatology  . Mitral regurgitation    a. 02/2014 Echo: mild to mod MR.  . Nonischemic cardiomyopathy ([place])    a. 06/2013 Cath: nonobs dzs, EF 20-25; b. 02/2014 Echo: EF 20-25%, diff HK, mild AI, mild to mod MR, mod dil LA, mildly reduced RV fxn, mildly dil RA, PASP wnl.  . Permanent atrial fibrillation ([place]) 01/20/2003   a. CHA2DS2VASc = 7-->coumadin.  Marland Kitchen  TIA (transient ischemic attack)    a. 07/2013 Admitted w/ transient AMS->No stroke;  b. 08/2013 Carotid U/S: <50% bilat ICA stenosis.    Past Surgical History:  Procedure Laterality Date  . APPENDECTOMY    . BUNIONECTOMY Right 02/2004   R MTP  . [place]   [facility]  . CARDIAC CATHETERIZATION  10/14   [facility]- 50% mid LCx, 40% prox RCA  . CYST EXCISION  08/07/2013   head  . TONSILLECTOMY      Social History    Socioeconomic History  . Marital status: Married    Spouse name: Not on file  . Number of children: 2  . Years of education: Not on file  . Highest education level: Not on file  Occupational History  . 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 Financial resource strain: Not on file  . Food insecurity:    Worry: Not on file    Inability: Not on file  . Transportation needs:    Medical: Not on file    Non-medical: Not on file  Tobacco Use  . Smoking status: Former Smoker    Packs/day: 0.25    Years: 44.00    Pack years: 11.00    Types: Cigarettes, Pipe    Last attempt to quit: 08/09/2013    Years since quitting: 4.7  . Smokeless tobacco: Never Used  Substance and Sexual Activity  . Alcohol use: Yes    Alcohol/week: 2.4 oz    Types: 4 Glasses of wine per week    Comment: 09/12/2013 "drinks 1-2 glasses of red wine daily, 2-3 times/wk"  . Drug use: No  . Sexual activity: Not Currently  Lifestyle  . Physical activity:    Days per week: Not on file    Minutes per session: Not on file  . Stress: Not on file  Relationships  . Social connections:    Talks on phone: Not on file    Gets together: Not on file    Attends religious service: Not on file    Active member of club or organization: Not on file    Attends meetings of clubs or organizations: Not on file    Relationship status: Not on file  . Intimate partner violence:    Fear of current or ex partner: Not on file    Emotionally abused: Not on file    Physically abused: Not on file    Forced sexual activity: Not on file  Other Topics Concern  . Not on file  Social History Narrative   Married since 1965.  They have two healthy grown children.   Former smoker, quit 1995, 35 pyh, started back as of 2011 with pipe smoking    Family History  Problem Relation Age of Onset  . Diabetes Father   . Other Mother        Deceased  .  Healthy Son   . Colon cancer Daughter        Cancer free  . Prostate cancer Neg Hx     Allergies  Allergen Reactions  . Lisinopril     REACTION: hyperkalemia at high dose  . Tramadol     constipation  . Varenicline Tartrate     REACTION: vivid dreams, nausea    Medication list reviewed and updated in full in [place].  GEN: No fevers, chills. Nontoxic. Primarily MSK c/o today. MSK:  Detailed in the HPI GI: tolerating PO intake without difficulty Neuro: No numbness, parasthesias, or tingling associated. Otherwise the pertinent positives of the ROS are noted above.   Objective:   BP 115/61   Pulse 78   Temp (!) 97.4 F (36.3 C) (Oral)   Ht 5\' 8"  (1.727 m)   Wt 184 lb 12 oz (83.8 kg)   BMI 28.09 kg/m    GEN: WDWN, NAD, Non-toxic, Alert & Oriented x 3 HEENT: Atraumatic, Normocephalic.  Ears and Nose: No external deformity. EXTR: No clubbing/cyanosis/edema NEURO: Normal gait.  PSYCH: Normally interactive. Conversant. Not depressed or anxious appearing.  Calm demeanor.    Active range of motion in the plane of abduction at this point is full.  Flexion is also full in both arms. Abduction strength, 4/5 Flexion strength 4+/5 External range of motion, strength 4/5 Internal range of motion strength 4+/5.  Radiology: No results found.  Assessment and Plan:   Tear of rotator cuff, unspecified laterality, unspecified tear extent, unspecified whether traumatic  Acute pain of both shoulders  Congestive dilated cardiomyopathy ([place])  Chronic atrial fibrillation ([place])  He really has made great progress compared to where I thought he would be.  His range of motion is much better.  He still has some baseline deficit, but he is making good improvement in his strength is also improving.  My recommendation to him was that he will likely need to be doing some form of upper extremity rehab indefinitely to maintain his functionality.  Follow-up: prn only with  me  Signed,  Frederico Hamman T. Ladell Bey, MD   Allergies as of 04/25/2018      Reactions   Lisinopril    REACTION: hyperkalemia at high dose   Tramadol    constipation   Varenicline Tartrate    REACTION: vivid dreams, nausea      Medication List        Accurate as of 04/25/18 11:59 PM. Always use your most recent med list.          allopurinol 100 MG tablet Commonly known as:  ZYLOPRIM Take 1 tablet (100 mg total) by mouth daily.   allopurinol 300 MG tablet Commonly known as:  ZYLOPRIM Take 1 tablet (300 mg total) by mouth daily.   apixaban 5 MG Tabs tablet Commonly known as:  ELIQUIS Take 1 tablet (5 mg total) by mouth 2 (two) times daily.   brimonidine 0.2 % ophthalmic solution Commonly known as:  ALPHAGAN Place into both eyes 2 (two) times daily.   budesonide-formoterol 160-4.5 MCG/ACT inhaler Commonly known as:  SYMBICORT INHALE 2 PUFFS INTO THE LUNGS 2 (TWO) TIMES DAILY. RINSE AFTER USE.   carvedilol 12.5 MG tablet Commonly known as:  COREG Take 0.5 tablets (6.25 mg total) by mouth 2 (two) times daily with a meal.   furosemide 40 MG tablet Commonly known as:  LASIX TAKE 1/2 TABLET BY MOUTH EVERY DAY AS NEEDED.   glucose blood test strip Commonly known as:  ONE TOUCH TEST STRIPS Use once daily   ipratropium 0.06 % nasal spray Commonly known as:  ATROVENT INHALE 2 SPRAYS IN EACH NOSTRIL DAILY   latanoprost 0.005 % ophthalmic solution Commonly known as:  XALATAN Place 1 drop into both eyes at bedtime.   simvastatin 20 MG tablet Commonly known as:  ZOCOR Take 1 tablet (20 mg total) by mouth daily.   vitamin C 1000 MG tablet Take 1,000 mg by mouth daily.

## 2018-04-25 NOTE — Telephone Encounter (Signed)
Wife called to report patient is taking Eliquis every day now and no longer on coumadin. She wants to know when he should have his "levels checked".  I explained to patient when I called back that Eliquis works different than warfarin and does not fluctuate or have near as many interaction risks associated with it as the warfarin.  As a result of this, the Eliquis only needs to be checked about once per year and as needed by his doctor.  Patient thanks me for this information and will discuss further with PCP at his next appointment.

## 2018-04-28 ENCOUNTER — Ambulatory Visit: Payer: Self-pay

## 2018-04-28 ENCOUNTER — Other Ambulatory Visit: Payer: Self-pay

## 2018-04-28 ENCOUNTER — Emergency Department
Admission: EM | Admit: 2018-04-28 | Discharge: 2018-04-28 | Disposition: A | Discharge status: Home / Self Care | Payer: Medicare Other | Attending: Emergency Medicine | Admitting: Emergency Medicine

## 2018-04-28 ENCOUNTER — Encounter: Payer: Self-pay | Admitting: Intensive Care

## 2018-04-28 DIAGNOSIS — Z79899 Other long term (current) drug therapy: Secondary | ICD-10-CM | POA: Insufficient documentation

## 2018-04-28 DIAGNOSIS — S51802A Unspecified open wound of left forearm, initial encounter: Secondary | ICD-10-CM | POA: Insufficient documentation

## 2018-04-28 DIAGNOSIS — I5022 Chronic systolic (congestive) heart failure: Secondary | ICD-10-CM | POA: Insufficient documentation

## 2018-04-28 DIAGNOSIS — Y939 Activity, unspecified: Secondary | ICD-10-CM | POA: Diagnosis not present

## 2018-04-28 DIAGNOSIS — Y999 Unspecified external cause status: Secondary | ICD-10-CM | POA: Diagnosis not present

## 2018-04-28 DIAGNOSIS — I110 Hypertensive heart disease with heart failure: Secondary | ICD-10-CM | POA: Insufficient documentation

## 2018-04-28 DIAGNOSIS — Y929 Unspecified place or not applicable: Secondary | ICD-10-CM | POA: Insufficient documentation

## 2018-04-28 DIAGNOSIS — S51812A Laceration without foreign body of left forearm, initial encounter: Secondary | ICD-10-CM

## 2018-04-28 DIAGNOSIS — E119 Type 2 diabetes mellitus without complications: Secondary | ICD-10-CM | POA: Diagnosis not present

## 2018-04-28 DIAGNOSIS — S59912A Unspecified injury of left forearm, initial encounter: Secondary | ICD-10-CM | POA: Diagnosis present

## 2018-04-28 DIAGNOSIS — F17210 Nicotine dependence, cigarettes, uncomplicated: Secondary | ICD-10-CM | POA: Diagnosis not present

## 2018-04-28 DIAGNOSIS — W2209XA Striking against other stationary object, initial encounter: Secondary | ICD-10-CM | POA: Insufficient documentation

## 2018-04-28 DIAGNOSIS — I2510 Atherosclerotic heart disease of native coronary artery without angina pectoris: Secondary | ICD-10-CM | POA: Insufficient documentation

## 2018-04-28 DIAGNOSIS — Z7901 Long term (current) use of anticoagulants: Secondary | ICD-10-CM | POA: Insufficient documentation

## 2018-04-28 NOTE — ED Triage Notes (Signed)
Patient tripped at home and skinned his arm on table. Patient takes eliquis daily. Denies hitting head. Bleeding controlled with guaze. A&O x4. Ambulatory in triage with no problems

## 2018-04-28 NOTE — Telephone Encounter (Signed)
Per chart review tab pt is at [facility] ED. FYI to Dr Damita Dunnings.

## 2018-04-28 NOTE — ED Provider Notes (Signed)
[facility] Emergency Department Provider Note  ____________________________________________  Time seen: Approximately 3:25 PM  I have reviewed the triage vital signs and the nursing notes.   HISTORY  Chief Complaint Fall   HPI Benjamin Soto is a 82 y.o. male who presents to the emergency department for treatment and evaluation of skin tears to his forearm.  He states that he tripped and fell forward but caught himself.  His arm scraped against a table.  He has a skin tear to the forearm and because he is on Coumadin, there was a great amount of bleeding.  Wife called the doctor's office and they told him to come to the emergency department.   Past Medical History:  Diagnosis Date  . Alcohol abuse   . Anticoagulated on Coumadin   . CAD (coronary artery disease)    a. 06/2013 Cath: LM nl, LAD nl, LCX 44m, RCA 40p, EF 25-30%.  . Diabetes mellitus, type 2 ([place]) 09/21/92  . Elbow fracture, right 2014   "healed by itself"  . Gout 04/22/2003   per Dr Jefm Bryant  . HFrEF (heart failure with reduced ejection fraction) ([place])    a.  02/2014 Echo: EF 20-25%, diff HK; b. 04/2014 CPX test: primary cirulatory limitation 2/2 advanced CHF.  Marland Kitchen History of colon polyps 10/26/02   colonoscopy  . History of GI bleed    a. 08/2013 Seen by GI at [facility]- felt to be diverticular in origin. Resolved off Coumadin. Cleared to resume per GI.  Marland Kitchen History of tobacco abuse   . Hyperlipidemia 05/22/01  . Hypertension   . Hypogonadism male   . Knee pain, right    [facility], Dr Jefm Bryant with rheumatology  . Mitral regurgitation    a. 02/2014 Echo: mild to mod MR.  . Nonischemic cardiomyopathy ([place])    a. 06/2013 Cath: nonobs dzs, EF 20-25; b. 02/2014 Echo: EF 20-25%, diff HK, mild AI, mild to mod MR, mod dil LA, mildly reduced RV fxn, mildly dil RA, PASP wnl.  . Permanent atrial fibrillation ([place]) 01/20/2003   a. CHA2DS2VASc = 7-->coumadin.  Marland Kitchen TIA (transient ischemic attack)    a.  07/2013 Admitted w/ transient AMS->No stroke;  b. 08/2013 Carotid U/S: <50% bilat ICA stenosis.    Patient Active Problem List   Diagnosis Date Noted  . HTN (hypertension) 02/02/2018  . Fall at home 02/02/2018  . Chronic atrial fibrillation ([place]) 07/27/2017  . Long term (current) use of anticoagulants 07/27/2017  . Advance care planning 09/28/2014  . Thrombocytopenia ([place]) 09/13/2013  . GI bleed 09/12/2013  . Transient amnesia 08/13/2013  . Congestive dilated cardiomyopathy ([place]) 06/30/2013  . CAD (coronary artery disease) 06/09/2013  . Medicare annual wellness visit, subsequent 02/06/2013  . Systolic CHF, chronic ([place]) 10/22/2011  . Wheeze 10/07/2011  . Hematuria, microscopic 08/17/2011  . Erectile dysfunction 02/09/2011  . RENAL INSUFFICIENCY 02/18/2010  . ROSACEA 02/18/2010  . FOOT PAIN, BILATERAL 02/28/2009  . DRY EYE SYNDROME 01/09/2008  . Paroxysmal SVT (supraventricular tachycardia) ([place]) 01/11/2007  . Gout 04/22/2003  . Atrial fibrillation, chronic ([place]) 01/20/2003  . Hyperlipidemia 05/22/2001  . History of diabetes mellitus 09/21/1992    Past Surgical History:  Procedure Laterality Date  . APPENDECTOMY    . BUNIONECTOMY Right 02/2004   R MTP  . [place]   [facility]  . CARDIAC CATHETERIZATION  10/14   [facility]- 50% mid LCx, 40% prox RCA  . CYST EXCISION  08/07/2013  head  . TONSILLECTOMY      Prior to Admission medications   Medication Sig Start Date End Date Taking? Authorizing Provider  allopurinol (ZYLOPRIM) 100 MG tablet Take 1 tablet (100 mg total) by mouth daily. 02/01/18   Tonia Ghent, MD  allopurinol (ZYLOPRIM) 300 MG tablet Take 1 tablet (300 mg total) by mouth daily. 02/01/18   Tonia Ghent, MD  apixaban (ELIQUIS) 5 MG TABS tablet Take 1 tablet (5 mg total) by mouth 2 (two) times daily. 04/15/18   Tonia Ghent, MD  Ascorbic Acid (VITAMIN C) 1000 MG tablet Take 1,000 mg by mouth daily.    [provider]  brimonidine (ALPHAGAN) 0.2 % ophthalmic solution Place into both eyes 2 (two) times daily.    [provider]  budesonide-formoterol (SYMBICORT) 160-4.5 MCG/ACT inhaler INHALE 2 PUFFS INTO THE LUNGS 2 (TWO) TIMES DAILY. RINSE AFTER USE. 02/01/18   Tonia Ghent, MD  carvedilol (COREG) 12.5 MG tablet Take 0.5 tablets (6.25 mg total) by mouth 2 (two) times daily with a meal. 02/08/18   Tonia Ghent, MD  furosemide (LASIX) 40 MG tablet TAKE 1/2 TABLET BY MOUTH EVERY DAY AS NEEDED. 02/01/18   Tonia Ghent, MD  glucose blood (ONE TOUCH TEST STRIPS) test strip Use once daily 11/02/13   Tonia Ghent, MD  ipratropium (ATROVENT) 0.06 % nasal spray INHALE 2 SPRAYS IN [facility] NOSTRIL DAILY 01/03/18   Tonia Ghent, MD  latanoprost (XALATAN) 0.005 % ophthalmic solution Place 1 drop into both eyes at bedtime.    [provider]  simvastatin (ZOCOR) 20 MG tablet Take 1 tablet (20 mg total) by mouth daily. 02/01/18   Tonia Ghent, MD    Allergies Lisinopril; Tramadol; and Varenicline tartrate  Family History  Problem Relation Age of Onset  . Diabetes Father   . Other Mother        Deceased  . Healthy Son   . Colon cancer Daughter        Cancer free  . Prostate cancer Neg Hx     Social History Social History   Tobacco Use  . Smoking status: Current Some Day Smoker    Packs/day: 0.25    Years: 44.00    Pack years: 11.00    Types: Cigarettes, Pipe  . Smokeless tobacco: Never Used  Substance Use Topics  . Alcohol use: Yes    Alcohol/week: 4.0 standard drinks    Types: 4 Glasses of wine per week    Comment: 09/12/2013 "drinks 1-2 glasses of red wine daily, 2-3 times/wk"  . Drug use: No    Review of Systems  Constitutional: Negative for fever. Respiratory: Negative for cough or shortness of breath.  Musculoskeletal: Negative for myalgias Skin: Positive for skin tear. Neurological: Negative for numbness or  paresthesias. ____________________________________________   PHYSICAL EXAM:  VITAL SIGNS: ED Triage Vitals  Enc Vitals Group     BP 04/28/18 1512 116/69     Pulse Rate 04/28/18 1512 81     Resp 04/28/18 1512 14     Temp 04/28/18 1512 (!) 97.5 F (36.4 C)     Temp Source 04/28/18 1512 Oral     SpO2 04/28/18 1512 99 %     Weight --      Height --      Head Circumference --      Peak Flow --      Pain Score 04/28/18 1518 0     Pain Loc --  Pain Edu? --      Excl. in [place]? --      Constitutional: Well appearing. Eyes: Conjunctivae are clear without discharge or drainage. Nose: No rhinorrhea noted. Mouth/Throat: Airway is patent.  Neck: No stridor. Unrestricted range of motion observed. Cardiovascular: Capillary refill is <3 seconds.  Respiratory: Respirations are even and unlabored.. Musculoskeletal: Unrestricted range of motion observed. Neurologic: Awake, alert, and oriented x 4.  Skin: 17cm skin tear laceration to the dorsal aspect of the left forearm with small amount of active bleeding.  ____________________________________________   LABS (all labs ordered are listed, but only abnormal results are displayed)  Labs Reviewed - No data to display ____________________________________________  EKG  Not indicated. ____________________________________________  RADIOLOGY  Not indicated. ____________________________________________   PROCEDURES  .Marland KitchenLaceration Repair Date/Time: 04/28/2018 10:26 PM Performed by: Victorino Dike, FNP Authorized by: Victorino Dike, FNP   Consent:    Consent obtained:  Verbal   Consent given by:  Patient   Risks discussed:  Infection, pain, retained foreign body, poor cosmetic result and poor wound healing Anesthesia (see MAR for exact dosages):    Anesthesia method:  None Laceration details:    Location:  Shoulder/arm   Shoulder/arm location:  L lower arm   Length (cm):  17 Repair type:    Repair type:   Simple Exploration:    Hemostasis achieved with:  Direct pressure   Wound exploration: entire depth of wound probed and visualized     Contaminated: no   Treatment:    Area cleansed with:  Saline   Amount of cleaning:  Standard   Irrigation solution:  Sterile saline   Visualized foreign bodies/material removed: no   Skin repair:    Repair method: skin tear. Post-procedure details:    Dressing:  Sterile dressing and non-adherent dressing   ____________________________________________   INITIAL IMPRESSION / ASSESSMENT AND PLAN / ED COURSE  Zorion Nims is a 82 y.o. male since the emergency department for treatment and evaluation of a skin tear to the left forearm.  The majority of the skin had actually peeled away and therefore there was no way to reapproximate the edges.  Wound was cleaned and sterile dressings were applied as above.  Dressing changes and wound care was discussed with the patient and his wife.  He is to call and schedule a follow-up appointment with his primary care provider for a wound check next week.  He was encouraged to return to the emergency department for symptoms of change or worsen if unable to schedule an earlier appointment.   Medications - No data to display   Pertinent labs & imaging results that were available during my care of the patient were reviewed by me and considered in my medical decision making (see chart for details).  ____________________________________________   FINAL CLINICAL IMPRESSION(S) / ED DIAGNOSES  Final diagnoses:  Skin tear of left forearm without complication, initial encounter    ED Discharge Orders    None       Note:  This document was prepared using Dragon voice recognition software and may include unintentional dictation errors.    Victorino Dike, FNP 04/28/18 2230    Arta Silence, MD 04/28/18 2340

## 2018-04-28 NOTE — Telephone Encounter (Signed)
Wife called on pt's behalf.  Reported pt. fell in garage about 20-30 minutes ago, and injured his left elbow.  Pt. Able to talk on the phone.  Denied any other injury.  Denied hitting his head.  Denied dizziness. Stated he hit a table with a sharp edge as he fell.  Denied that he has a gaping wound.  Stated "the skin is gone in an area of about 2" x 4".  On Eliquis.  Reported active bleeding from left elbow.  Advised to apply clean gauze to left elbow wound with gentle pressure, and support the left elbow on a pillow; apply direct pressure to left elbow with right hand.  Encouraged to transport to the ER right away.  Advised if pt. becomes light headed during the transport, he should lay down in the car.  Wife stated pt. denies any dizziness at this time, and is sitting in a recliner.  Stated she applied clean gauze to the elbow.  Agrees with plan to go to ER; verbalized understanding of instructions.           Reason for Disposition . [1] Bleeding AND [2] won't stop after 10 minutes of direct pressure (using correct technique)    Stated left elbow is actively bleeding; on Eliquis; advised to apply pressure bandage to elbow with clean gauze, and then apply steady direct pressure with his right hand over left elbow area.  Answer Assessment - Initial Assessment Questions 1. MECHANISM: "How did the injury happen?"     Fell in garage; hit left elbow on a sharp edged table  2. ONSET: "When did the injury happen?" (Minutes or hours ago)      Fell 20 minutes ago 3. LOCATION: "What part of the elbow is the injured?"      Left elbow 4. APPEARANCE of INJURY: "What does the injury look like?"      Skin torn off of elbow 5. SEVERITY: "Can you use the elbow normally?"  "Can you bend it and straighten it fully?"     activley bleeding 6. SIZE: For cuts, bruises, or swelling, ask: "How large is it?" (e.g., inches or centimeters; entire joint)      2" x 4" area 7. PAIN: "Is there pain?" If so, ask: "How bad is the  pain?"    (Scale 1-10; or mild, moderate, severe)     denied 8. TETANUS: For any breaks in the skin, ask: "When was the last tetanus booster?"     Recently had tentanus booster recently 9. OTHER SYMPTOMS: "Do you have any other symptoms?"  (e.g., numbness in hand)     Denied other symptoms 10. PREGNANCY: "Is there any chance you are pregnant?" "When was your last menstrual period?"       N/a  Protocols used: ELBOW INJURY-A-AH

## 2018-04-29 ENCOUNTER — Encounter: Payer: Self-pay | Admitting: Internal Medicine

## 2018-04-29 ENCOUNTER — Telehealth: Payer: Self-pay

## 2018-04-29 ENCOUNTER — Ambulatory Visit (INDEPENDENT_AMBULATORY_CARE_PROVIDER_SITE_OTHER): Payer: Medicare Other | Admitting: Internal Medicine

## 2018-04-29 ENCOUNTER — Other Ambulatory Visit: Payer: Self-pay

## 2018-04-29 VITALS — BP 106/66 | HR 65 | Temp 97.6°F | Wt 187.0 lb

## 2018-04-29 DIAGNOSIS — S51019A Laceration without foreign body of unspecified elbow, initial encounter: Secondary | ICD-10-CM | POA: Diagnosis not present

## 2018-04-29 MED ORDER — SILVER SULFADIAZINE 1 % EX CREA: 1.0000 "application " | TOPICAL_CREAM | Freq: Every day | CUTANEOUS | 0 refills | Status: DC

## 2018-04-29 NOTE — Assessment & Plan Note (Signed)
Large area but looks okay No infection Still some oozing   Redressed with non stick pads and silvadene Dry gauze/coban Discussed home treatment/dressings---leave on for 2 days for now, then daily Cover for showers until scabbed, etc

## 2018-04-29 NOTE — Telephone Encounter (Signed)
Copied from [place] [id number]. Topic: Appointment Scheduling - Scheduling Inquiry for Clinic >> Apr 29, 2018  8:30 AM Scherrie Gerlach wrote: Reason for CRM: pt seen at the ED for skin laceration and wife states it is seeping through. Per Arbie Cookey, triage nurse, someone should look at it. Dr Damita Dunnings has no appts, but scheduled with Dr Silvio Pate at 11 am

## 2018-04-29 NOTE — Telephone Encounter (Signed)
Pt has appt with Dr Silvio Pate 04/29/18 at 11 AM; pt was seen in [facility] ED on 04/28/18; pt is on Eliquis per 04/15/18 phone note. FYI to Dr Silvio Pate.

## 2018-04-29 NOTE — Telephone Encounter (Signed)
See OV note.  

## 2018-04-29 NOTE — Progress Notes (Signed)
Subjective:    Patient ID: Benjamin Soto, male    DOB: 06-25-1936, 82 y.o.   MRN: [id number]  HPI Here for ER follow up  Was in garage by work bench yesterday Somehow was falling--tried to catch himself and caught left arm on edge of table Significant skin tear ER removed this--not viable Dressed in ER Has blood drainage on outer side of bandage this morning  Current Outpatient Medications on File Prior to Visit  Medication Sig Dispense Refill  . allopurinol (ZYLOPRIM) 100 MG tablet Take 1 tablet (100 mg total) by mouth daily. 90 tablet 3  . allopurinol (ZYLOPRIM) 300 MG tablet Take 1 tablet (300 mg total) by mouth daily. 90 tablet 3  . apixaban (ELIQUIS) 5 MG TABS tablet Take 1 tablet (5 mg total) by mouth 2 (two) times daily. 60 tablet 2  . Ascorbic Acid (VITAMIN C) 1000 MG tablet Take 1,000 mg by mouth daily.    . brimonidine (ALPHAGAN) 0.2 % ophthalmic solution Place into both eyes 2 (two) times daily.    . budesonide-formoterol (SYMBICORT) 160-4.5 MCG/ACT inhaler INHALE 2 PUFFS INTO THE LUNGS 2 (TWO) TIMES DAILY. RINSE AFTER USE. 3 Inhaler 3  . carvedilol (COREG) 12.5 MG tablet Take 0.5 tablets (6.25 mg total) by mouth 2 (two) times daily with a meal.    . furosemide (LASIX) 40 MG tablet TAKE 1/2 TABLET BY MOUTH EVERY DAY AS NEEDED. 90 tablet 1  . glucose blood (ONE TOUCH TEST STRIPS) test strip Use once daily 100 each 3  . ipratropium (ATROVENT) 0.06 % nasal spray INHALE 2 SPRAYS IN EACH NOSTRIL DAILY 15 mL 1  . latanoprost (XALATAN) 0.005 % ophthalmic solution Place 1 drop into both eyes at bedtime.    . simvastatin (ZOCOR) 20 MG tablet Take 1 tablet (20 mg total) by mouth daily. 90 tablet 3   No current facility-administered medications on file prior to visit.     Allergies  Allergen Reactions  . Lisinopril     REACTION: hyperkalemia at high dose  . Tramadol     constipation  . Varenicline Tartrate     REACTION: vivid dreams, nausea    Past Medical History:    Diagnosis Date  . Alcohol abuse   . Anticoagulated on Coumadin   . CAD (coronary artery disease)    a. 06/2013 Cath: LM nl, LAD nl, LCX 25m, RCA 40p, EF 25-30%.  . Diabetes mellitus, type 2 ([place]) 09/21/92  . Elbow fracture, right 2014   "healed by itself"  . Gout 04/22/2003   per Dr Jefm Bryant  . HFrEF (heart failure with reduced ejection fraction) ([place])    a.  02/2014 Echo: EF 20-25%, diff HK; b. 04/2014 CPX test: primary cirulatory limitation 2/2 advanced CHF.  Marland Kitchen History of colon polyps 10/26/02   colonoscopy  . History of GI bleed    a. 08/2013 Seen by GI at [facility]- felt to be diverticular in origin. Resolved off Coumadin. Cleared to resume per GI.  Marland Kitchen History of tobacco abuse   . Hyperlipidemia 05/22/01  . Hypertension   . Hypogonadism male   . Knee pain, right    [facility], Dr Jefm Bryant with rheumatology  . Mitral regurgitation    a. 02/2014 Echo: mild to mod MR.  . Nonischemic cardiomyopathy ([place])    a. 06/2013 Cath: nonobs dzs, EF 20-25; b. 02/2014 Echo: EF 20-25%, diff HK, mild AI, mild to mod MR, mod dil LA, mildly reduced RV fxn, mildly dil RA, PASP wnl.  Marland Kitchen  Permanent atrial fibrillation ([place]) 01/20/2003   a. CHA2DS2VASc = 7-->coumadin.  Marland Kitchen TIA (transient ischemic attack)    a. 07/2013 Admitted w/ transient AMS->No stroke;  b. 08/2013 Carotid U/S: <50% bilat ICA stenosis.    Past Surgical History:  Procedure Laterality Date  . APPENDECTOMY    . BUNIONECTOMY Right 02/2004   R MTP  . [place]   normal, [facility] hospital [facility]  . CARDIAC CATHETERIZATION  10/14   [facility]- 50% mid LCx, 40% prox RCA  . CYST EXCISION  08/07/2013   head  . TONSILLECTOMY      Family History  Problem Relation Age of Onset  . Diabetes Father   . Other Mother        Deceased  . Healthy Son   . Colon cancer Daughter        Cancer free  . Prostate cancer Neg Hx     Social History   Socioeconomic History  . Marital status: Married    Spouse name: Not on file  .  Number of children: 2  . Years of education: Not on file  . Highest education level: Not on file  Occupational History  . 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 Financial resource strain: Not on file  . Food insecurity:    Worry: Not on file    Inability: Not on file  . Transportation needs:    Medical: Not on file    Non-medical: Not on file  Tobacco Use  . Smoking status: Former Smoker    Packs/day: 0.25    Years: 44.00    Pack years: 11.00    Types: Cigarettes, Pipe  . Smokeless tobacco: Never Used  Substance and Sexual Activity  . Alcohol use: Yes    Alcohol/week: 4.0 standard drinks    Types: 4 Glasses of wine per week    Comment: 09/12/2013 "drinks 1-2 glasses of red wine daily, 2-3 times/wk"  . Drug use: No  . Sexual activity: Not Currently  Lifestyle  . Physical activity:    Days per week: Not on file    Minutes per session: Not on file  . Stress: Not on file  Relationships  . Social connections:    Talks on phone: Not on file    Gets together: Not on file    Attends religious service: Not on file    Active member of club or organization: Not on file    Attends meetings of clubs or organizations: Not on file    Relationship status: Not on file  . Intimate partner violence:    Fear of current or ex partner: Not on file    Emotionally abused: Not on file    Physically abused: Not on file    Forced sexual activity: Not on file  Other Topics Concern  . Not on file  Social History Narrative   Married since 1965.  They have two healthy grown children.   Former smoker, quit 1995, 35 pyh, started back as of 2011 with pipe smoking   Review of Systems  On eliquis for A fib Feels well     Objective:   Physical Exam  Constitutional: He appears well-developed. No distress.  Skin:  Skin tear over entire volar left elbow and distal ~10-12cm total length No inflammation Still  oozing along entire amount           Assessment & Plan:

## 2018-04-29 NOTE — Telephone Encounter (Signed)
Noted.  Will await/review ER notes.  Thanks.

## 2018-04-30 ENCOUNTER — Emergency Department
Admission: EM | Admit: 2018-04-30 | Discharge: 2018-04-30 | Disposition: A | Discharge status: Home / Self Care | Payer: Medicare Other | Attending: Emergency Medicine | Admitting: Emergency Medicine

## 2018-04-30 ENCOUNTER — Encounter: Payer: Self-pay | Admitting: *Deleted

## 2018-04-30 ENCOUNTER — Other Ambulatory Visit: Payer: Self-pay

## 2018-04-30 DIAGNOSIS — S51012D Laceration without foreign body of left elbow, subsequent encounter: Secondary | ICD-10-CM

## 2018-04-30 DIAGNOSIS — S51012A Laceration without foreign body of left elbow, initial encounter: Secondary | ICD-10-CM | POA: Diagnosis not present

## 2018-04-30 DIAGNOSIS — Z5189 Encounter for other specified aftercare: Secondary | ICD-10-CM

## 2018-04-30 DIAGNOSIS — Z4801 Encounter for change or removal of surgical wound dressing: Secondary | ICD-10-CM | POA: Diagnosis not present

## 2018-04-30 DIAGNOSIS — Z4800 Encounter for change or removal of nonsurgical wound dressing: Secondary | ICD-10-CM | POA: Insufficient documentation

## 2018-04-30 NOTE — ED Provider Notes (Signed)
[facility] Emergency Department Provider Note   ____________________________________________   First MD Initiated Contact with Patient 04/30/18 0216     (approximate)  I have reviewed the triage vital signs and the nursing notes.   HISTORY  Chief Complaint Wound Check    HPI Benjamin Soto is a 82 y.o. male who presents to the ED from home with a chief complaint of oozing from his wound.  She was seen in the ED 2 nights prior for fall resulting in large skin tear around his  left elbow.  Patient is on Eliquis for atrial fibrillation.  Wound was bandaged in the ED and patient had a follow-up with his PCP yesterday afternoon.  Wife says PCP was "rough" while removing the bandage resulting in oozing.  They were instructed to leave the bandage alone for 2 days and then apply Silvadene cream.  They state they noted oozing from the dressing and that is why they are here.  Patient denies fever, chills, chest pain, shortness of breath, abdominal pain, nausea or vomiting.   Past Medical History:  Diagnosis Date  . Alcohol abuse   . Anticoagulated on Coumadin   . CAD (coronary artery disease)    a. 06/2013 Cath: LM nl, LAD nl, LCX 54m, RCA 40p, EF 25-30%.  . Diabetes mellitus, type 2 ([place]) 09/21/92  . Elbow fracture, right 2014   "healed by itself"  . Gout 04/22/2003   per Dr Jefm Bryant  . HFrEF (heart failure with reduced ejection fraction) ([place])    a.  02/2014 Echo: EF 20-25%, diff HK; b. 04/2014 CPX test: primary cirulatory limitation 2/2 advanced CHF.  Marland Kitchen History of colon polyps 10/26/02   colonoscopy  . History of GI bleed    a. 08/2013 Seen by GI at [facility]- felt to be diverticular in origin. Resolved off Coumadin. Cleared to resume per GI.  Marland Kitchen History of tobacco abuse   . Hyperlipidemia 05/22/01  . Hypertension   . Hypogonadism male   . Knee pain, right    [facility], Dr Jefm Bryant with rheumatology  . Mitral regurgitation    a. 02/2014 Echo: mild to mod  MR.  . Nonischemic cardiomyopathy ([place])    a. 06/2013 Cath: nonobs dzs, EF 20-25; b. 02/2014 Echo: EF 20-25%, diff HK, mild AI, mild to mod MR, mod dil LA, mildly reduced RV fxn, mildly dil RA, PASP wnl.  . Permanent atrial fibrillation ([place]) 01/20/2003   a. CHA2DS2VASc = 7-->coumadin.  Marland Kitchen TIA (transient ischemic attack)    a. 07/2013 Admitted w/ transient AMS->No stroke;  b. 08/2013 Carotid U/S: <50% bilat ICA stenosis.    Patient Active Problem List   Diagnosis Date Noted  . HTN (hypertension) 02/02/2018  . Fall at home 02/02/2018  . Chronic atrial fibrillation ([place]) 07/27/2017  . Long term (current) use of anticoagulants 07/27/2017  . Skin tear of elbow without complication, initial encounter 03/25/2017  . Advance care planning 09/28/2014  . Thrombocytopenia ([place]) 09/13/2013  . GI bleed 09/12/2013  . Transient amnesia 08/13/2013  . Congestive dilated cardiomyopathy ([place]) 06/30/2013  . CAD (coronary artery disease) 06/09/2013  . Medicare annual wellness visit, subsequent 02/06/2013  . Systolic CHF, chronic ([place]) 10/22/2011  . Wheeze 10/07/2011  . Hematuria, microscopic 08/17/2011  . Erectile dysfunction 02/09/2011  . RENAL INSUFFICIENCY 02/18/2010  . ROSACEA 02/18/2010  . FOOT PAIN, BILATERAL 02/28/2009  . DRY EYE SYNDROME 01/09/2008  . Paroxysmal SVT (supraventricular tachycardia) ([place]) 01/11/2007  . Gout 04/22/2003  . Atrial  fibrillation, chronic ([place]) 01/20/2003  . Hyperlipidemia 05/22/2001  . History of diabetes mellitus 09/21/1992    Past Surgical History:  Procedure Laterality Date  . APPENDECTOMY    . BUNIONECTOMY Right 02/2004   R MTP  . [place]   [facility]  . CARDIAC CATHETERIZATION  10/14   [facility]- 50% mid LCx, 40% prox RCA  . CYST EXCISION  08/07/2013   head  . TONSILLECTOMY      Prior to Admission medications   Medication Sig Start Date End Date Taking? Authorizing Provider  allopurinol (ZYLOPRIM) 100 MG tablet  Take 1 tablet (100 mg total) by mouth daily. 02/01/18   Tonia Ghent, MD  allopurinol (ZYLOPRIM) 300 MG tablet Take 1 tablet (300 mg total) by mouth daily. 02/01/18   Tonia Ghent, MD  apixaban (ELIQUIS) 5 MG TABS tablet Take 1 tablet (5 mg total) by mouth 2 (two) times daily. 04/15/18   Tonia Ghent, MD  Ascorbic Acid (VITAMIN C) 1000 MG tablet Take 1,000 mg by mouth daily.    [provider]  brimonidine (ALPHAGAN) 0.2 % ophthalmic solution Place into both eyes 2 (two) times daily.    [provider]  budesonide-formoterol (SYMBICORT) 160-4.5 MCG/ACT inhaler INHALE 2 PUFFS INTO THE LUNGS 2 (TWO) TIMES DAILY. RINSE AFTER USE. 02/01/18   Tonia Ghent, MD  carvedilol (COREG) 12.5 MG tablet Take 0.5 tablets (6.25 mg total) by mouth 2 (two) times daily with a meal. 02/08/18   Tonia Ghent, MD  furosemide (LASIX) 40 MG tablet TAKE 1/2 TABLET BY MOUTH EVERY DAY AS NEEDED. 02/01/18   Tonia Ghent, MD  glucose blood (ONE TOUCH TEST STRIPS) test strip Use once daily 11/02/13   Tonia Ghent, MD  ipratropium (ATROVENT) 0.06 % nasal spray INHALE 2 SPRAYS IN [facility] NOSTRIL DAILY 01/03/18   Tonia Ghent, MD  latanoprost (XALATAN) 0.005 % ophthalmic solution Place 1 drop into both eyes at bedtime.    [provider]  silver sulfADIAZINE (SILVADENE) 1 % cream Apply 1 application topically daily. 04/29/18   Venia Carbon, MD  simvastatin (ZOCOR) 20 MG tablet Take 1 tablet (20 mg total) by mouth daily. 02/01/18   Tonia Ghent, MD    Allergies Lisinopril; Tramadol; and Varenicline tartrate  Family History  Problem Relation Age of Onset  . Diabetes Father   . Other Mother        Deceased  . Healthy Son   . Colon cancer Daughter        Cancer free  . Prostate cancer Neg Hx     Social History Social History   Tobacco Use  . Smoking status: Current Some Day Smoker    Packs/day: 0.25    Years: 44.00    Pack years: 11.00    Types: Cigarettes, Pipe    . Smokeless tobacco: Never Used  Substance Use Topics  . Alcohol use: Yes    Alcohol/week: 4.0 standard drinks    Types: 4 Glasses of wine per week    Comment: 09/12/2013 "drinks 1-2 glasses of red wine daily, 2-3 times/wk"  . Drug use: No    Review of Systems  Constitutional: No fever/chills Eyes: No visual changes. ENT: No sore throat. Cardiovascular: Denies chest pain. Respiratory: Denies shortness of breath. Gastrointestinal: No abdominal pain.  No nausea, no vomiting.  No diarrhea.  No constipation. Genitourinary: Negative for dysuria. Musculoskeletal: Positive for oozing wound.  Negative for back pain.  Skin: Negative for rash. Neurological: Negative for headaches, focal weakness or numbness.   ____________________________________________   PHYSICAL EXAM:  VITAL SIGNS: ED Triage Vitals  Enc Vitals Group     BP 04/30/18 0043 116/69     Pulse Rate 04/30/18 0043 97     Resp 04/30/18 0043 18     Temp 04/30/18 0043 (!) 97.5 F (36.4 C)     Temp Source 04/30/18 0043 Oral     SpO2 04/30/18 0043 97 %     Weight 04/30/18 0043 184 lb (83.5 kg)     Height 04/30/18 0043 5\' 9"  (1.753 m)     Head Circumference --      Peak Flow --      Pain Score 04/30/18 0054 0     Pain Loc --      Pain Edu? --      Excl. in [place]? --     Constitutional: Alert and oriented. Well appearing and in no acute distress. Eyes: Conjunctivae are normal. PERRL. EOMI. Head: Atraumatic. Nose: No congestion/rhinnorhea. Mouth/Throat: Mucous membranes are moist.  Oropharynx non-erythematous. Neck: No stridor.   Cardiovascular: Normal rate, irregular rhythm. Grossly normal heart sounds.  Good peripheral circulation. Respiratory: Normal respiratory effort.  No retractions. Lungs CTAB. Gastrointestinal: Soft and nontender. No distention. No abdominal bruits. No CVA tenderness. Musculoskeletal:  LUE: Large skin tear to back of left arm with Silvadene cream.  There is no active bleeding.  There are 2  areas which remain raw; each is approximately 1 cm in diameter.  There is no erythema, warmth, fluctuance or swelling.  2+ radial pulse.  Brisk, less than 5-second capillary refill. No lower extremity tenderness nor edema.  No joint effusions. Neurologic:  Normal speech and language. No gross focal neurologic deficits are appreciated. No gait instability. Skin:  Skin is warm, dry and intact. No rash noted. Psychiatric: Mood and affect are normal. Speech and behavior are normal.  ____________________________________________   LABS (all labs ordered are listed, but only abnormal results are displayed)  Labs Reviewed - No data to display ____________________________________________  EKG  None ____________________________________________  RADIOLOGY  ED MD interpretation: None  Official radiology report(s): No results found.  ____________________________________________   PROCEDURES  Procedure(s) performed: None  Procedures  Critical Care performed: No  ____________________________________________   INITIAL IMPRESSION / ASSESSMENT AND PLAN / ED COURSE  As part of my medical decision making, I reviewed the following data within the electronic MEDICAL RECORD NUMBER History obtained from family, Nursing notes reviewed and incorporated, Old chart reviewed and Notes from prior ED visits   82 year old male who presents for a wound check of large skin tear which is through his dressing; on Eliquis for atrial fibrillation.  I personally reviewed patient's ED visit as well as his PCP office visit.  On examination there is no active bleeding but 2 areas which appear raw and irritated.  These were covered with Surgicel with overlying dressing.  Makeshift splint applied so patient does not overuse his elbow.  He is instructed to keep the dressing and splint in place for the next 24 hours.  After that he may remove it and follow his doctor's wound care instructions.  Strict return precautions  given.  Both verbalize understanding and agree with plan of care.      ____________________________________________   FINAL CLINICAL IMPRESSION(S) / ED DIAGNOSES  Final diagnoses:  Visit for wound check  Skin tear of elbow without complication, left, subsequent encounter     ED  Discharge Orders    None       Note:  This document was prepared using Dragon voice recognition software and may include unintentional dictation errors.    Paulette Blanch, MD 04/30/18 [phone]

## 2018-04-30 NOTE — ED Triage Notes (Signed)
Pt presents w/ wound that he sustained on this past Thursday. Pt was seen by his doctor who traumatically removed the bandage and caused the wound to begin bleeding again on Friday. Pt is here for continued bleeding from wound.

## 2018-04-30 NOTE — Discharge Instructions (Signed)
Leave dressing on for 24 hours.  After that you may remove dressing and apply the cream as directed by your doctor.  Keep wound clean and dry.  Return to the ER for recurrent or worsening symptoms, redness/swelling, purulent discharge or other concerns.

## 2018-04-30 NOTE — ED Notes (Signed)
Arm dressed with surgicel and non adherent dressing and wrapped.

## 2018-04-30 NOTE — ED Notes (Signed)
Pt had a fall and had a skin tear to left arm reports saw PCP and had dressing done by MD on Thursday afternoon pt reports bandage was so tied he unwrapped and bleeding started, bleeding controlled

## 2018-05-02 ENCOUNTER — Telehealth: Payer: Self-pay | Admitting: *Deleted

## 2018-05-02 ENCOUNTER — Telehealth: Payer: Self-pay | Admitting: Family Medicine

## 2018-05-02 NOTE — Telephone Encounter (Signed)
Patient phoned in earlier this morning saying that he had fallen again and sustained a wound to his leg that looks bad.  Patient went to ER and then followed up with Dr. Silvio Pate who re-dressed the wound and it became too tight so patient and his wife took the bandage off and had to go back to ER because it wouldn't stop bleeding.  Patient now wants to see Dr. Damita Dunnings.  Two appointments were available this morning but patient could not take those appointments as they were expecting a painter to be at their home shortly.   Patient said he was going to call the wound center because he had been there for treatment recently but later called back saying that the wound center was not able to see him until 05/18/18.  Appointment was scheduled with Dr. Damita Dunnings on Wed, Aug 14 which is the next available appt.  Please advise.

## 2018-05-02 NOTE — Telephone Encounter (Signed)
Patient advised to come at 2:45 tomorrow, 05/03/18.

## 2018-05-02 NOTE — Telephone Encounter (Signed)
Called patient and scheduled appointment.

## 2018-05-02 NOTE — Telephone Encounter (Addendum)
If actively bleeding, then needs eval now whereever possible. O/w 2:45 tomorrow may be useful if no emergent sx.   Thanks.

## 2018-05-02 NOTE — Telephone Encounter (Signed)
Copied from [place] [id number]. Topic: Quick Communication - See Telephone Encounter >> May 02, 2018  9:43 AM Neva Seat wrote: Pt needing Dr. Josefine Class nurse to call him back.  He had to finish discussing an issue.

## 2018-05-03 ENCOUNTER — Other Ambulatory Visit: Payer: Self-pay | Admitting: Family Medicine

## 2018-05-03 ENCOUNTER — Encounter: Payer: Self-pay | Admitting: Family Medicine

## 2018-05-03 ENCOUNTER — Ambulatory Visit (INDEPENDENT_AMBULATORY_CARE_PROVIDER_SITE_OTHER): Payer: Medicare Other | Admitting: Family Medicine

## 2018-05-03 DIAGNOSIS — S51019S Laceration without foreign body of unspecified elbow, sequela: Secondary | ICD-10-CM

## 2018-05-03 DIAGNOSIS — I482 Chronic atrial fibrillation, unspecified: Secondary | ICD-10-CM

## 2018-05-03 MED ORDER — CARVEDILOL 3.125 MG PO TABS
3.1250 mg | ORAL_TABLET | Freq: Two times a day (BID) | ORAL | 1 refills | Status: DC
Start: 2018-05-03 — End: 2018-10-13

## 2018-05-03 NOTE — Progress Notes (Signed)
On eliquis, off coumadin.  He isn't lightheaded unless he gets up quickly.  D/w pt about lowering his BB slightly given his fall risk.  D/w pt about wearing shoes that wouldn't inc his fall risk.  He had previously fallen and ended up with a skin tear on his left elbow.  Here for recheck today.  No fevers.  He has been dressing it daily.  It is still occasionally been oozing some blood.  Meds, vitals, and allergies reviewed.   ROS: Per HPI unless specifically indicated in ROS section   nad ncat R>L elbow bandaged initially (left elbow with large area covered with roll gauze, he has a Band-Aid on the right elbow) Bruising on L flank w/o tenderness noted.  Left elbow inspected.  Normal range of motion.  He has 2 skin tears noted.  One is very small and appeared to have some bleeding due to removal of the bandage-this area did not have nonstick bandage coverage.  The other larger area was not bleeding.  There is no chance for skin reapproximation given the nature of the skin tears.  Neither lesion appears infected.

## 2018-05-03 NOTE — Patient Instructions (Signed)
Leave the bandage on today.  Change daily.  Use a nonstick bandage with silvadene on the area then wrap that.  Use the splint as needed.  Take care.  Glad to see you.

## 2018-05-04 ENCOUNTER — Ambulatory Visit: Payer: Medicare Other | Admitting: Family Medicine

## 2018-05-05 NOTE — Assessment & Plan Note (Signed)
Discussed with patient about options.  All of the skin tear area was covered with nonstick bandages with Silvadene.  Then covered again with roll gauze.  Tolerated well.  He still able to move his elbow as needed.  He has normal granulation tissue.  He only has minimal oozing at 1 of the sites and that was related to previous bandaging.  Change bandage daily.  Neither lesion appears infected.  Recheck next week.  Okay for outpatient follow-up.

## 2018-05-05 NOTE — Assessment & Plan Note (Signed)
I am worried about him getting potentially lightheaded related to carvedilol.  Decrease dose to 3.125 mg twice a day.  We can recheck blood pressure next week.  He agrees with plan.

## 2018-05-09 ENCOUNTER — Ambulatory Visit (INDEPENDENT_AMBULATORY_CARE_PROVIDER_SITE_OTHER): Payer: Medicare Other | Admitting: Family Medicine

## 2018-05-09 ENCOUNTER — Encounter: Payer: Self-pay | Admitting: Family Medicine

## 2018-05-09 VITALS — BP 124/84 | HR 78 | Temp 97.6°F | Ht 69.0 in

## 2018-05-09 DIAGNOSIS — S51019S Laceration without foreign body of unspecified elbow, sequela: Secondary | ICD-10-CM | POA: Diagnosis not present

## 2018-05-09 DIAGNOSIS — I482 Chronic atrial fibrillation, unspecified: Secondary | ICD-10-CM

## 2018-05-09 NOTE — Progress Notes (Signed)
Hypertension:    Using medication without problems or lightheadedness: yes Chest pain with exertion:no Edema:no Short of breath:no On lower dose of coreg.  No heart racing.   Not lightheaded.   Skin tear f/u.  R elbow healing.  No fevers, no chills.  Has kept bandaged.    Meds, vitals, and allergies reviewed.   PMH and SH reviewed  ROS: Per HPI unless specifically indicated in ROS section   GEN: nad, alert and oriented CV: IRR but not tachy PULM: ctab, no inc wob EXT: no edema SKIN: no acute rash but R olecranon healing well with local scab noted.   L elbow 10x4 and 2x1 cm lesions. Clearly superficial and healing with normal granulation tissue.

## 2018-05-09 NOTE — Patient Instructions (Signed)
Keep doing daily dressing changes.   Okay to cancel the wound clinic appointment if better.  Recheck here in 1 week.   Take care.  Glad to see you.

## 2018-05-11 NOTE — Assessment & Plan Note (Signed)
On lower dose of coreg.  No heart racing.   Not lightheaded. Continue as is.

## 2018-05-11 NOTE — Assessment & Plan Note (Signed)
L elbow 10x4 and 2x1 cm lesions. Clearly superficial and healing with normal granulation tissue.  Redressed with Silvadene and nonstick bandage.  No complication.  Appears to be healing normally.  Recheck in about 1 week.  We can make a decision about follow-up at the wound clinic at that point.  Patient agrees.  None of the lesions appear infected.  Fall cautions discussed with patient.

## 2018-05-13 ENCOUNTER — Other Ambulatory Visit: Payer: Self-pay | Admitting: Family Medicine

## 2018-05-13 NOTE — Telephone Encounter (Signed)
Denied, patient is now on eliquis.

## 2018-05-16 ENCOUNTER — Ambulatory Visit (INDEPENDENT_AMBULATORY_CARE_PROVIDER_SITE_OTHER): Payer: Self-pay | Admitting: Family Medicine

## 2018-05-16 ENCOUNTER — Encounter: Payer: Self-pay | Admitting: Family Medicine

## 2018-05-16 DIAGNOSIS — I482 Chronic atrial fibrillation, unspecified: Secondary | ICD-10-CM

## 2018-05-16 DIAGNOSIS — S51019S Laceration without foreign body of unspecified elbow, sequela: Secondary | ICD-10-CM

## 2018-05-16 NOTE — Assessment & Plan Note (Signed)
Sig improvement.  Minimally flaky skin, should not need wound clinic eval.  D/w pt.  No intervention today, so no charge for visit.  He agrees.  See avs.

## 2018-05-16 NOTE — Assessment & Plan Note (Signed)
He is still on lower dose of coreg, 3.125mg  BID.  Not lightheaded.  D/w pt about adequate fluid intake.  Continue meds as is.  He agrees.  See avs.

## 2018-05-16 NOTE — Patient Instructions (Addendum)
No charge for visit.    Cancel the wound clinic appointment.   Shower like normal.   Don't get sunburned.   Update me as needed. Use can use benadryl cream if needed for itching.    Drink enough water to keep your urine clear or light colored.  If lightheaded, then let me know.  Take care.  Glad to see you.

## 2018-05-16 NOTE — Progress Notes (Signed)
Wound check and f/u.    2 lesions near L elbow.  Each ~1cm with only some flaky skin but no ulceration.  O/w completely healed skin tear. He has change in pigmentation with scarring but o/w intact skin.  D/w pt.    He is still on lower dose of coreg, 3.125mg  BID.  Not lightheaded.  D/w pt about adequate fluid intake.   nad ncat IRR, not tachy 2 resolving lesions near L elbow.  Each ~1cm with only some flaky skin but no ulceration.  O/w completely healed skin tear. He has change in pigmentation with scarring but o/w intact skin.   Ext w/o edema.

## 2018-05-17 ENCOUNTER — Ambulatory Visit: Payer: Medicare Other

## 2018-05-18 ENCOUNTER — Ambulatory Visit: Payer: Medicare Other | Admitting: Internal Medicine

## 2018-06-14 ENCOUNTER — Telehealth: Payer: Self-pay | Admitting: Family Medicine

## 2018-06-14 DIAGNOSIS — T148XXA Other injury of unspecified body region, initial encounter: Secondary | ICD-10-CM

## 2018-06-14 NOTE — Telephone Encounter (Signed)
I would expect some bruising.  Would recheck CBC given the bruising.  Order in EMR.  If frank bleeding would need to stop med.  Offer OV if he would like to discuss.  At minimum would check CBC.  Thanks.

## 2018-06-14 NOTE — Telephone Encounter (Signed)
Copied from [place] [id number]. Topic: General - Other >> Jun 14, 2018 10:02 AM [name] Solders wrote: Reason for CRM: pt is calling and he is now on eliquis from warfarin. Pt is having bruises on his legs and would like to know if he should switch back to warfarin. Pt would like a return call from nurse

## 2018-06-15 NOTE — Telephone Encounter (Signed)
Patient's wife (on [place]) notified as instructed by telephone and verbalized understanding. Appointment scheduled tomorrow with Dr. Damita Dunnings to discuss the bruising.

## 2018-06-16 ENCOUNTER — Ambulatory Visit (INDEPENDENT_AMBULATORY_CARE_PROVIDER_SITE_OTHER): Payer: Medicare Other

## 2018-06-16 ENCOUNTER — Ambulatory Visit (INDEPENDENT_AMBULATORY_CARE_PROVIDER_SITE_OTHER): Payer: Medicare Other | Admitting: Family Medicine

## 2018-06-16 ENCOUNTER — Encounter: Payer: Self-pay | Admitting: Family Medicine

## 2018-06-16 VITALS — BP 106/58 | HR 79 | Temp 97.7°F | Ht 69.0 in | Wt 184.5 lb

## 2018-06-16 DIAGNOSIS — Z23 Encounter for immunization: Secondary | ICD-10-CM

## 2018-06-16 DIAGNOSIS — T148XXA Other injury of unspecified body region, initial encounter: Secondary | ICD-10-CM

## 2018-06-16 NOTE — Patient Instructions (Signed)
Don't stop the eliquis for now and let me check your labs.  We'll go from there.  Take care.  Glad to see you.  Go to the lab on the way out.  We'll contact you with your lab report.

## 2018-06-16 NOTE — Progress Notes (Signed)
Taking eliquis 5mg  BID until 2 days ago- he cut back to 5mg  qd 2 days ago.  He had more bruising in the last few weeks.  No oral or nasal bleeding.  No blood in urine or stool.  Bruising on the arms, extensor > flexor sides.    No bruising on the trunk.    Asking about options.  Not on other blood thinners.  Meds, vitals, and allergies reviewed.   ROS: Per HPI unless specifically indicated in ROS section   nad ncat IRR ctab abd soft Ext w/o edema Bilateral arms with senile purpura noted on the extensor side of the forearm. Left elbow skin tear has healed well.

## 2018-06-17 DIAGNOSIS — T148XXA Other injury of unspecified body region, initial encounter: Secondary | ICD-10-CM | POA: Insufficient documentation

## 2018-06-17 LAB — CBC WITH DIFFERENTIAL/PLATELET
Basophils Absolute: 0.1 10*3/uL (ref 0.0–0.1)
Basophils Relative: 1.2 % (ref 0.0–3.0)
Eosinophils Absolute: 0.1 10*3/uL (ref 0.0–0.7)
Eosinophils Relative: 1.6 % (ref 0.0–5.0)
HCT: 39.4 % (ref 39.0–52.0)
Hemoglobin: 13.1 g/dL (ref 13.0–17.0)
Lymphocytes Relative: 25.8 % (ref 12.0–46.0)
Lymphs Abs: 1.4 10*3/uL (ref 0.7–4.0)
MCHC: 33.3 g/dL (ref 30.0–36.0)
MCV: 98.4 fl (ref 78.0–100.0)
Monocytes Absolute: 0.5 10*3/uL (ref 0.1–1.0)
Monocytes Relative: 9.5 % (ref 3.0–12.0)
Neutro Abs: 3.3 10*3/uL (ref 1.4–7.7)
Neutrophils Relative %: 61.9 % (ref 43.0–77.0)
Platelets: 188 10*3/uL (ref 150.0–400.0)
RBC: 4.01 Mil/uL — ABNORMAL LOW (ref 4.22–5.81)
RDW: 14.7 % (ref 11.5–15.5)
WBC: 5.3 10*3/uL (ref 4.0–10.5)

## 2018-06-17 LAB — COMPREHENSIVE METABOLIC PANEL
ALT: 12 U/L (ref 0–53)
AST: 15 U/L (ref 0–37)
Albumin: 3.9 g/dL (ref 3.5–5.2)
Alkaline Phosphatase: 53 U/L (ref 39–117)
BUN: 19 mg/dL (ref 6–23)
CO2: 31 mEq/L (ref 19–32)
Calcium: 9.3 mg/dL (ref 8.4–10.5)
Chloride: 101 mEq/L (ref 96–112)
Creatinine, Ser: 1.35 mg/dL (ref 0.40–1.50)
GFR: 54.02 mL/min — ABNORMAL LOW (ref >60.00)
Glucose, Bld: 101 mg/dL — ABNORMAL HIGH (ref 70–99)
Potassium: 4.6 mEq/L (ref 3.5–5.1)
Sodium: 139 mEq/L (ref 135–145)
Total Bilirubin: 0.5 mg/dL (ref 0.2–1.2)
Total Protein: 6.2 g/dL (ref 6.0–8.3)

## 2018-06-17 NOTE — Assessment & Plan Note (Signed)
No alarming bleeding.  Advised patient to restart Eliquis twice a day in the meantime.  Check routine labs just to make sure we do not need to renally dose his medication.  See notes on CBC.  At this point still okay for outpatient follow-up.  He agrees with plan.

## 2018-07-05 ENCOUNTER — Other Ambulatory Visit: Payer: Self-pay | Admitting: Family Medicine

## 2018-07-28 ENCOUNTER — Ambulatory Visit: Payer: Self-pay | Admitting: General Practice

## 2018-08-05 ENCOUNTER — Other Ambulatory Visit: Payer: Self-pay | Admitting: Family Medicine

## 2018-08-23 DIAGNOSIS — M7072 Other bursitis of hip, left hip: Secondary | ICD-10-CM | POA: Diagnosis not present

## 2018-08-30 NOTE — Progress Notes (Signed)
Cardiology Office Note  Date:  08/31/2018   ID:  Benjamin Soto, DOB [birth year], MRN [id number]  PCP:  Tonia Ghent, MD   Chief Complaint  Patient presents with  . other    12 mo follow up. Medications reviewed verbally.     HPI:  [name] is a pleasant 82 year-old gentleman with a history of chronic atrial fibrillation, on warfarin long smoking hx, stopped in past few months heavy ETOH hx,  remote history of SVT,  dilated cardiomyopathy severe arthritis in his knees,   diabetes,  hyperlipidemia,  who presents for routine followup Of his nonischemic cardiomyopathy  Reports that he is taking Coreg BID, very low dose.  Taking less than 3 mg twice daily, cuts it into pieces Gait instability , no regular exercise program Rare dizzy when getting up, nothing recently.  Previously felt to be from medications causing low blood pressure No edema, no abdominal bloating Denies any chest pain or shortness of breath  Long discussion concerning anticoagulation Concerned about bruising with Eliquis Also had bruising with warfarin Troubled by the co-pay of the Eliquis  Takes Lasix rarely for ankle swelling  Lab work discussed with him in detail HBA1C 6.0 L Total chol 131, LDL 51   drinks wine on a regular basis, sometimes in heavy amounts.  History of falls, mechanical fall over the summer 2014  on concrete  EKG personally reviewed by myself on todays visit Shows atrial fibrillation ventricular rate 80 bpm right bundle branch block, left anterior fascicular block  Other past medical history reviewed cardiac catheterization in May 2014 showing nonobstructive CAD, ejection fraction 30-35% Echocardiogram with ejection fraction 20-25%, last evaluated in 2015 History low testosterone in the past, is not on any supplement  Lisinopril was held previously secondary to low blood pressures and dizziness.   Previous episode of confusion, amnesia. Wife witnessed episode 08/09/2013.  She felt he was having a stroke. 911 was called and he was transported to [facility].  Carotid ultrasound showed mild bilateral carotid disease. INR at the time was greater than 2. CT scan of the brain, MRI of the brain showed diffuse cortical atrophy, chronic ischemic white matter disease TIA or stroke. EKG 08/09/2013 showing atrial fibrillation with rate 130 beats per minute. He attributed everything to scalp resection procedure done twice with dermatology prior to the event. He had significant pain around his head prior to the episode. Significant oozing from the wound site. This was fixed on repeat surgery, scraping with additional stitches placed.  Echocardiogram last in mid 2015  showing ejection fraction 20-25%, moderate mitral regurgitation, moderate LVH  CT Scan of his abdomen  showed coronary calcifications.   chest x-ray showed calcified granuloma in the right lower lobe which was not seen in 2012 Labs show total cholesterol less than 100, LDL in the 40 range    PMH:   has a past medical history of Alcohol abuse, Anticoagulated on Coumadin, CAD (coronary artery disease), Diabetes mellitus, type 2 ([place]) (09/21/92), Elbow fracture, right (2014), Gout (04/22/2003), HFrEF (heart failure with reduced ejection fraction) ([place]), History of colon polyps (10/26/02), History of GI bleed, History of tobacco abuse, Hyperlipidemia (05/22/01), Hypertension, Hypogonadism male, Knee pain, right, Mitral regurgitation, Nonischemic cardiomyopathy ([place]), Permanent atrial fibrillation (01/20/2003), and TIA (transient ischemic attack).  PSH:    Past Surgical History:  Procedure Laterality Date  . APPENDECTOMY    . BUNIONECTOMY Right 02/2004   R MTP  . [place]   normal, [facility] by  [facility]  . CARDIAC CATHETERIZATION  10/14   [facility]- 50% mid LCx, 40% prox RCA  . CYST EXCISION  08/07/2013   head  . TONSILLECTOMY      Current Outpatient Medications  Medication Sig Dispense Refill  .  allopurinol (ZYLOPRIM) 100 MG tablet Take 1 tablet (100 mg total) by mouth daily. 90 tablet 3  . Ascorbic Acid (VITAMIN C) 1000 MG tablet Take 1,000 mg by mouth daily.    . brimonidine (ALPHAGAN) 0.2 % ophthalmic solution Place into both eyes 2 (two) times daily.    . budesonide-formoterol (SYMBICORT) 160-4.5 MCG/ACT inhaler INHALE 2 PUFFS INTO THE LUNGS 2 (TWO) TIMES DAILY. RINSE AFTER USE. 3 Inhaler 3  . carvedilol (COREG) 3.125 MG tablet Take 1 tablet (3.125 mg total) by mouth 2 (two) times daily with a meal. 180 tablet 1  . ELIQUIS 5 MG TABS tablet TAKE 1 TABLET BY MOUTH TWICE A DAY 60 tablet 2  . furosemide (LASIX) 40 MG tablet TAKE 1/2 TABLET BY MOUTH EVERY DAY AS NEEDED. 90 tablet 1  . glucose blood (ONE TOUCH TEST STRIPS) test strip Use once daily 100 each 3  . ipratropium (ATROVENT) 0.06 % nasal spray USE 2 SPRAYS IN EACH NOSTRIL DAILY 15 mL 5  . latanoprost (XALATAN) 0.005 % ophthalmic solution Place 1 drop into both eyes at bedtime.    . simvastatin (ZOCOR) 20 MG tablet Take 1 tablet (20 mg total) by mouth daily. 90 tablet 3   No current facility-administered medications for this visit.      Allergies:   Lisinopril; Tramadol; and Varenicline tartrate   Social History:  The patient  reports that he has been smoking cigarettes and pipe. He has a 11.00 pack-year smoking history. He has never used smokeless tobacco. He reports that he drinks about 4.0 standard drinks of alcohol per week. He reports that he does not use drugs.   Family History:   family history includes Colon cancer in his daughter; Diabetes in his father; Healthy in his son; Other in his mother.    Review of Systems: Review of Systems  Constitutional: Negative.   Respiratory: Negative.   Cardiovascular: Negative.   Gastrointestinal: Negative.   Musculoskeletal: Negative.   Neurological: Negative.   Psychiatric/Behavioral: Negative.   All other systems reviewed and are negative.    PHYSICAL EXAM: VS:  BP  130/72 (BP Location: Left Arm, Patient Position: Sitting, Cuff Size: Normal)   Pulse 85   Ht 5\' 8"  (1.727 m)   Wt 184 lb (83.5 kg)   BMI 27.98 kg/m  , BMI Body mass index is 27.98 kg/m. Constitutional:  oriented to person, place, and time. No distress.  HENT:  Head: Grossly normal Eyes:  no discharge. No scleral icterus.  Neck: No JVD, no carotid bruits  Cardiovascular: Regular rate and rhythm, no murmurs appreciated Pulmonary/Chest: Clear to auscultation bilaterally, no wheezes or rails Abdominal: Soft.  no distension.  no tenderness.  Musculoskeletal: Normal range of motion Neurological:  normal muscle tone. Coordination normal. No atrophy Skin: Skin warm and dry Psychiatric: normal affect, pleasant   Recent Labs: 06/16/2018: ALT 12; BUN 19; Creatinine, Ser 1.35; Hemoglobin 13.1; Platelets 188.0; Potassium 4.6; Sodium 139    Lipid Panel Lab Results  Component Value Date   CHOL 133 01/26/2018   HDL 72.30 01/26/2018   LDLCALC 52 01/26/2018   TRIG 44.0 01/26/2018      Wt Readings from Last 3 Encounters:  08/31/18 184 lb (83.5 kg)  06/16/18 184 lb  8 oz (83.7 kg)  05/16/18 185 lb 8 oz (84.1 kg)       ASSESSMENT AND PLAN:  Chronic atrial fibrillation ([place]) - Plan: EKG 12-Lead Permanent Long discussion concerning risk and benefit of warfarin versus Eliquis After discussing benefits of Eliquis he will stay on the medication at current dose 5 twice daily  Mixed hyperlipidemia Cholesterol is at goal on the current lipid regimen. No changes to the medications were made.  Stable  Systolic CHF, chronic ([place])  depressed ejection fraction, last evaluated 2015. Previously declined echocardiogram Unable to exclude alcohol mediated cardiomyopathy.   Continues to drink on a regular basis  ACE inhibitor, ARB previously held for hypotension Rarely takes Lasix.  Overall appears relatively stable  Paroxysmal SVT (supraventricular tachycardia) (HCC) Remote history of SVT, none  recently No recent events  Coronary artery disease involving native coronary artery of native heart without angina pectoris Prior smoking history, cholesterol at goal Denies anginal symptoms  Type 2 diabetes mellitus with complication, without long-term current use of insulin (HCC) Hemoglobin A1c well controlled Weight stable  Weakness Significant gait instability, work minute walking program  Alcohol abuse Recommended alcohol cessation, likely contributing to dilated cardiomyopathy Weakness today getting down from the table   Total encounter time more than 25 minutes  Greater than 50% was spent in counseling and coordination of care with the patient   Disposition:   F/U  12 months   No orders of the defined types were placed in this encounter.    Signed, Esmond Plants, M.D., Ph.D. 08/31/2018  [place], [place]

## 2018-08-31 ENCOUNTER — Encounter: Payer: Self-pay | Admitting: Cardiovascular Disease

## 2018-08-31 ENCOUNTER — Ambulatory Visit: Payer: Medicare Other | Admitting: Cardiovascular Disease

## 2018-08-31 VITALS — BP 130/72 | HR 85 | Ht 68.0 in | Wt 184.0 lb

## 2018-08-31 DIAGNOSIS — I482 Chronic atrial fibrillation, unspecified: Secondary | ICD-10-CM

## 2018-08-31 DIAGNOSIS — I5022 Chronic systolic (congestive) heart failure: Secondary | ICD-10-CM

## 2018-08-31 DIAGNOSIS — I471 Supraventricular tachycardia: Secondary | ICD-10-CM

## 2018-08-31 DIAGNOSIS — I951 Orthostatic hypotension: Secondary | ICD-10-CM

## 2018-08-31 DIAGNOSIS — E782 Mixed hyperlipidemia: Secondary | ICD-10-CM

## 2018-08-31 DIAGNOSIS — F1010 Alcohol abuse, uncomplicated: Secondary | ICD-10-CM

## 2018-08-31 DIAGNOSIS — E118 Type 2 diabetes mellitus with unspecified complications: Secondary | ICD-10-CM | POA: Insufficient documentation

## 2018-08-31 NOTE — Patient Instructions (Signed)

## 2018-09-05 DIAGNOSIS — H401131 Primary open-angle glaucoma, bilateral, mild stage: Secondary | ICD-10-CM | POA: Diagnosis not present

## 2018-09-23 DIAGNOSIS — H26492 Other secondary cataract, left eye: Secondary | ICD-10-CM | POA: Diagnosis not present

## 2018-10-02 ENCOUNTER — Other Ambulatory Visit: Payer: Self-pay | Admitting: Family Medicine

## 2018-10-13 ENCOUNTER — Other Ambulatory Visit: Payer: Self-pay | Admitting: Family Medicine

## 2018-10-14 DIAGNOSIS — H26491 Other secondary cataract, right eye: Secondary | ICD-10-CM | POA: Diagnosis not present

## 2018-12-11 ENCOUNTER — Other Ambulatory Visit: Payer: Self-pay | Admitting: Family Medicine

## 2018-12-12 NOTE — Telephone Encounter (Signed)
Electronic refill request. Eliquis Last office visit:   06/16/2018 Last Filled:    60 tablet 2 10/03/2018  Please advise.

## 2018-12-13 NOTE — Telephone Encounter (Signed)
Sent. Thanks.   

## 2019-02-23 ENCOUNTER — Other Ambulatory Visit: Payer: Self-pay | Admitting: Family Medicine

## 2019-03-17 ENCOUNTER — Ambulatory Visit (INDEPENDENT_AMBULATORY_CARE_PROVIDER_SITE_OTHER): Payer: Medicare Other | Admitting: Family Medicine

## 2019-03-17 ENCOUNTER — Other Ambulatory Visit: Payer: Self-pay

## 2019-03-17 ENCOUNTER — Encounter: Payer: Self-pay | Admitting: Family Medicine

## 2019-03-17 DIAGNOSIS — S51019A Laceration without foreign body of unspecified elbow, initial encounter: Secondary | ICD-10-CM | POA: Diagnosis not present

## 2019-03-17 MED ORDER — SILVER SULFADIAZINE 1 % EX CREA: 1.0000 "application " | TOPICAL_CREAM | Freq: Every day | CUTANEOUS | 0 refills | Status: DC

## 2019-03-17 NOTE — Patient Instructions (Signed)
The spot looks like it is healing normally and not infected.  Use silvadene cream daily with nonstick bandage and update me as needed.  I can check you next week if needed.  Ask up front about an appointment.  Gently wash the area with warm soapy water but don't scrub it.  Take care.  Glad to see you.

## 2019-03-17 NOTE — Progress Notes (Signed)
Skin tear L elbow.  Was carrying in groceries, had a heavy load, coming into the house and "got hung up."  [place] against the wall, hit L elbow.  Was wearing a long sleeve shirt and has skin tear.  This was 6 days ago.  No LOC.  No other injury.  It was a mechanical fall.  He has had skin tears in the past.  He and his wife have been caring for it locally.  They wanted to get it checked out to make sure it was healing normally.  Meds, vitals, and allergies reviewed.   ROS: Per HPI unless specifically indicated in ROS section   nad ncat Senile purpura noted on the arms bilaterally as expected given his age and anticoagulation. 4x4 skin tear L elbow, superficial.  Has some granulation tissue.  No overlying skin that needs to be reapproximated.  No ulceration.  Appears clean and with normal healing.

## 2019-03-19 NOTE — Assessment & Plan Note (Signed)
He had a mechanical fall and got the skin tear.  He did everything right in the meantime in terms of wound care.  He has been covering it with a nonstick bandage and using Silvadene.  I did the same.  Prescription sent for Silvadene.  I gave him some nonstick bandages and roll gauze to use in the meantime.  They want me to recheck it next week and I will be glad to do so.  I am always glad to see this patient.

## 2019-03-23 ENCOUNTER — Ambulatory Visit (INDEPENDENT_AMBULATORY_CARE_PROVIDER_SITE_OTHER): Payer: Medicare Other | Admitting: Family Medicine

## 2019-03-23 ENCOUNTER — Encounter: Payer: Self-pay | Admitting: Family Medicine

## 2019-03-23 ENCOUNTER — Other Ambulatory Visit: Payer: Self-pay

## 2019-03-23 DIAGNOSIS — S51019D Laceration without foreign body of unspecified elbow, subsequent encounter: Secondary | ICD-10-CM | POA: Diagnosis not present

## 2019-03-23 NOTE — Progress Notes (Signed)
Recheck L arm skin tear.  No fevers.  Feeling well.  No other falls.  Has been treating the area with Silvadene and nonstick bandages.  Meds, vitals, and allergies reviewed.   ROS: Per HPI unless specifically indicated in ROS section   nad Left arm with healing skin tear.  Smaller compared to previous.  Now 2x1cm with granulation.  No purulent discharge.  No spreading erythema.  Not tender.

## 2019-03-23 NOTE — Patient Instructions (Signed)
Keep bandaged at night and uncovered during the day.  Update me as needed.  Take care.  Glad to see you.

## 2019-03-25 NOTE — Assessment & Plan Note (Addendum)
Appears to be healing normally.  Continue using a nonstick bandage at night with Silvadene.  He was worried about it getting macerated with nonstick bandages and Silvadene used 24/7.  He can leave it open during the day.  Update me as needed.  He agrees with plan.  We talked about the routine course of healing and routine advice was given.  I redressed the wound with Silvadene and nonstick bandage.  Tolerated well.>15 minutes spent in face to face time with patient, >50% spent in counselling or coordination of care

## 2019-03-26 ENCOUNTER — Other Ambulatory Visit: Payer: Self-pay | Admitting: Family Medicine

## 2019-04-05 ENCOUNTER — Other Ambulatory Visit: Payer: Self-pay | Admitting: Family Medicine

## 2019-04-10 ENCOUNTER — Other Ambulatory Visit: Payer: Self-pay | Admitting: Family Medicine

## 2019-04-10 NOTE — Telephone Encounter (Signed)
Spouse called stating pt went to pick up his eliquis it was $105.pt didn't get his rx.  She stated they could not afford this and he wanted to know if he could to back to [facility]fin.    Best number [phone]  Please advise

## 2019-04-11 NOTE — Telephone Encounter (Signed)
Please talk to me about getting this patient set up.  Many thanks.

## 2019-04-11 NOTE — Telephone Encounter (Signed)
Anna pharmacist at [facility] said that pts wife said that pt was switched from eliquis to warfarin and if this is accurate needs rx for warfarin sent to [facility] or call with which me pt should be taking.FYI to [facility] and Dr Damita Dunnings.

## 2019-04-11 NOTE — Telephone Encounter (Signed)
Spoke with patient and attempted to qualify him for Eliquis discount card.  If patient had commercial insurance they would have approved but since he has [place] (the medicare portion excludes him from acceptance).   Patient and I discussed this at length and he would like to pursue changing back to warfarin therapy.  Per protocol that I have, patient will require a 3 day overlap period for Eliquis to warfarin change.  I recommend the following if Dr. Damita Dunnings is in approval:  1.  Patient is to start 1 pill (5mg ) daily of warfarin on Wednesday 7/22 while continuing on BID eliquis.   2.  Patient is to take his last dose of Eliquis on Friday (7/24).  Patient to continue daily 5mg  of Warfarin.   3.  Recheck on INR here in clinic on Monday (7/27) at 10am.  (5 days after initiation of warfarin therapy.   Will pend new r/x for warfarin for Dr. Josefine Class approval along with proposed above overlap plan.

## 2019-04-12 MED ORDER — WARFARIN SODIUM 5 MG PO TABS: ORAL_TABLET | ORAL | 3 refills | Status: DC

## 2019-04-12 NOTE — Telephone Encounter (Signed)
Yes, I agree with all of that.  Thanks.

## 2019-04-13 ENCOUNTER — Other Ambulatory Visit: Payer: Self-pay | Admitting: *Deleted

## 2019-04-13 NOTE — Telephone Encounter (Signed)
[facility] saying patient is requesting new Rx for Warfarin.  Wife states Eliquis was discontinued and now on Warfarin but "we don't have Rx for Warfarin".

## 2019-04-13 NOTE — Telephone Encounter (Signed)
This is duplicate request.  This refill was taken care of yesterday.

## 2019-04-17 ENCOUNTER — Ambulatory Visit (INDEPENDENT_AMBULATORY_CARE_PROVIDER_SITE_OTHER): Payer: Medicare Other

## 2019-04-17 DIAGNOSIS — Z7901 Long term (current) use of anticoagulants: Secondary | ICD-10-CM

## 2019-04-17 LAB — POCT INR: INR: 1.2 — AB (ref 2.0–3.0)

## 2019-04-17 NOTE — Patient Instructions (Signed)
INR today: 1.2 *Note patient just started back on coumadin last Wednesday and stopped Eliquis on Friday.  He has taken 5 doses of 5mg  of warfarin.  Due to medication still increasing in his system, will have him take 7.5mg  today (7/27) and tomorrow (7/28) then resume 5mg  until recheck on Thursday 7/30.    Written instructions given, patient verbalizes understanding of dosing and recheck plan.

## 2019-04-18 NOTE — Progress Notes (Signed)
Agree. Thanks

## 2019-04-19 ENCOUNTER — Other Ambulatory Visit: Payer: Self-pay | Admitting: Family Medicine

## 2019-04-19 NOTE — Telephone Encounter (Signed)
Dr. Damita Dunnings do you need to see patient for routine follow up? He was in for acute issues recently. No future appointments.

## 2019-04-19 NOTE — Telephone Encounter (Signed)
Sent.  Reasonable to schedule a Medicare visit when possible, ie this fall.  Thanks.

## 2019-04-19 NOTE — Telephone Encounter (Signed)
Please schedule appointment as instructed. 

## 2019-04-20 ENCOUNTER — Ambulatory Visit (INDEPENDENT_AMBULATORY_CARE_PROVIDER_SITE_OTHER): Payer: Medicare Other | Admitting: General Practice

## 2019-04-20 DIAGNOSIS — Z7901 Long term (current) use of anticoagulants: Secondary | ICD-10-CM

## 2019-04-20 LAB — POCT INR: INR: 1.7 — AB (ref 2.0–3.0)

## 2019-04-20 NOTE — Patient Instructions (Addendum)
Pre visit review using our clinic review tool, if applicable. No additional management support is needed unless otherwise documented below in the visit note.  Take 1 1/2 tablets today and tomorrow (7/30 and 7/31) and then take 1 tablet daily.  Re-check in 1 week.

## 2019-04-20 NOTE — Telephone Encounter (Signed)
Pt scheduled for labs 05/25/19 and AMV on 06/01/19

## 2019-04-27 ENCOUNTER — Ambulatory Visit (INDEPENDENT_AMBULATORY_CARE_PROVIDER_SITE_OTHER): Payer: Medicare Other | Admitting: General Practice

## 2019-04-27 DIAGNOSIS — Z7901 Long term (current) use of anticoagulants: Secondary | ICD-10-CM

## 2019-04-27 LAB — POCT INR: INR: 3.7 — AB (ref 2.0–3.0)

## 2019-04-27 NOTE — Patient Instructions (Addendum)
Pre visit review using our clinic review tool, if applicable. No additional management support is needed unless otherwise documented below in the visit note.  Skip coumadin today (8/6) and then take 1/2 tablet tomorrow.  On  Saturday continue to take 1 tablet daily.  Re-check in 2 weeks.

## 2019-05-07 ENCOUNTER — Other Ambulatory Visit: Payer: Self-pay | Admitting: Family Medicine

## 2019-05-11 ENCOUNTER — Ambulatory Visit (INDEPENDENT_AMBULATORY_CARE_PROVIDER_SITE_OTHER): Payer: Medicare Other | Admitting: General Practice

## 2019-05-11 DIAGNOSIS — I482 Chronic atrial fibrillation, unspecified: Secondary | ICD-10-CM

## 2019-05-11 DIAGNOSIS — Z7901 Long term (current) use of anticoagulants: Secondary | ICD-10-CM | POA: Diagnosis not present

## 2019-05-11 LAB — POCT INR: INR: 4.6 — AB (ref 2.0–3.0)

## 2019-05-11 NOTE — Patient Instructions (Signed)
Pre visit review using our clinic review tool, if applicable. No additional management support is needed unless otherwise documented below in the visit note.  Skip coumadin today and tomorrow (8/20 and 8/21) and then change dosage and tale 1 tablet daily except 1/2 tablet on Mondays and Friday.  Re-check in 1 to 2 weeks.

## 2019-05-14 NOTE — Progress Notes (Signed)
Agree. Thanks

## 2019-05-22 ENCOUNTER — Other Ambulatory Visit: Payer: Self-pay | Admitting: Family Medicine

## 2019-05-22 DIAGNOSIS — I482 Chronic atrial fibrillation, unspecified: Secondary | ICD-10-CM

## 2019-05-22 DIAGNOSIS — Z8639 Personal history of other endocrine, nutritional and metabolic disease: Secondary | ICD-10-CM

## 2019-05-22 DIAGNOSIS — M109 Gout, unspecified: Secondary | ICD-10-CM

## 2019-05-22 DIAGNOSIS — I10 Essential (primary) hypertension: Secondary | ICD-10-CM

## 2019-05-23 ENCOUNTER — Ambulatory Visit (INDEPENDENT_AMBULATORY_CARE_PROVIDER_SITE_OTHER): Payer: Medicare Other

## 2019-05-23 DIAGNOSIS — Z7901 Long term (current) use of anticoagulants: Secondary | ICD-10-CM | POA: Diagnosis not present

## 2019-05-23 LAB — POCT INR: INR: 2.5 (ref 2.0–3.0)

## 2019-05-23 NOTE — Patient Instructions (Addendum)
INR today:  2.5  Continue taking 1 tablet daily except 1/2 tablet on Mondays and Friday.  Re-check in 2 weeks.  Patient is doing well without any changes in diet, health or medications.    I did note some wheezing during patients INR check today.  He denies any SOB, or other respiratory symptoms and only occasional swelling noted to B ankles.  I did assess him further and determined lungs were clear on auscultation without any noted wheezing, crackles/rales or sign of fluid retention.  In addition, patient did not present with any edema today to extremities or to abdomen.  I did note that he had increased phlegm production in throat and mouth as he was getting "strangled" at times on the mucous but denies any allergy type symptoms.  Oxygen saturation was 99% on RA.  His audible wheezing likely was due to increased loose secretions to upper airway as he states this is common first thing in the mornings and he was wearing a mask.  I did encourage him to drink water to help with thinning secretions but he states, "I hate water and probably won't but I do need to start taking my lasix again."  He takes 1/2 daily as needed and states he hasn't used it in a while and has noted recent increased ankle swelling.  Overall patient feels fine without any complaints or concerns.    I will forward my note to patient's PCP in case anything further is needed for patient at this time. He does have routine follow up appointment with Dr. Damita Dunnings Next week 06/01/19.

## 2019-05-24 NOTE — Progress Notes (Signed)
Agree. Thanks

## 2019-05-25 ENCOUNTER — Other Ambulatory Visit: Payer: Self-pay

## 2019-05-25 ENCOUNTER — Other Ambulatory Visit (INDEPENDENT_AMBULATORY_CARE_PROVIDER_SITE_OTHER): Payer: Medicare Other

## 2019-05-25 DIAGNOSIS — I10 Essential (primary) hypertension: Secondary | ICD-10-CM | POA: Diagnosis not present

## 2019-05-25 DIAGNOSIS — M109 Gout, unspecified: Secondary | ICD-10-CM | POA: Diagnosis not present

## 2019-05-25 DIAGNOSIS — Z8639 Personal history of other endocrine, nutritional and metabolic disease: Secondary | ICD-10-CM | POA: Diagnosis not present

## 2019-05-25 DIAGNOSIS — I482 Chronic atrial fibrillation, unspecified: Secondary | ICD-10-CM

## 2019-05-25 LAB — CBC WITH DIFFERENTIAL/PLATELET
Basophils Absolute: 0 10*3/uL (ref 0.0–0.1)
Basophils Relative: 0.7 % (ref 0.0–3.0)
Eosinophils Absolute: 0.1 10*3/uL (ref 0.0–0.7)
Eosinophils Relative: 1.9 % (ref 0.0–5.0)
HCT: 40.7 % (ref 39.0–52.0)
Hemoglobin: 13.3 g/dL (ref 13.0–17.0)
Lymphocytes Relative: 22.7 % (ref 12.0–46.0)
Lymphs Abs: 1.1 10*3/uL (ref 0.7–4.0)
MCHC: 32.7 g/dL (ref 30.0–36.0)
MCV: 97.7 fl (ref 78.0–100.0)
Monocytes Absolute: 0.5 10*3/uL (ref 0.1–1.0)
Monocytes Relative: 10.3 % (ref 3.0–12.0)
Neutro Abs: 3.1 10*3/uL (ref 1.4–7.7)
Neutrophils Relative %: 64.4 % (ref 43.0–77.0)
Platelets: 164 10*3/uL (ref 150.0–400.0)
RBC: 4.17 Mil/uL — ABNORMAL LOW (ref 4.22–5.81)
RDW: 15.3 % (ref 11.5–15.5)
WBC: 4.7 10*3/uL (ref 4.0–10.5)

## 2019-05-25 LAB — COMPREHENSIVE METABOLIC PANEL
ALT: 17 U/L (ref 0–53)
AST: 21 U/L (ref 0–37)
Albumin: 3.8 g/dL (ref 3.5–5.2)
Alkaline Phosphatase: 53 U/L (ref 39–117)
BUN: 19 mg/dL (ref 6–23)
CO2: 31 mEq/L (ref 19–32)
Calcium: 8.9 mg/dL (ref 8.4–10.5)
Chloride: 100 mEq/L (ref 96–112)
Creatinine, Ser: 1.26 mg/dL (ref 0.40–1.50)
GFR: 54.91 mL/min — ABNORMAL LOW (ref >60.00)
Glucose, Bld: 123 mg/dL — ABNORMAL HIGH (ref 70–99)
Potassium: 4.4 mEq/L (ref 3.5–5.1)
Sodium: 138 mEq/L (ref 135–145)
Total Bilirubin: 0.8 mg/dL (ref 0.2–1.2)
Total Protein: 5.7 g/dL — ABNORMAL LOW (ref 6.0–8.3)

## 2019-05-25 LAB — LIPID PANEL
Cholesterol: 127 mg/dL (ref 0–200)
HDL: 54.8 mg/dL (ref >39.00)
LDL Cholesterol: 50 mg/dL (ref 0–99)
NonHDL: 71.87
Total CHOL/HDL Ratio: 2
Triglycerides: 108 mg/dL (ref 0.0–149.0)
VLDL: 21.6 mg/dL (ref 0.0–40.0)

## 2019-05-25 LAB — HEMOGLOBIN A1C: Hgb A1c MFr Bld: 6 % (ref 4.6–6.5)

## 2019-05-25 LAB — URIC ACID: Uric Acid, Serum: 3.1 mg/dL — ABNORMAL LOW (ref 4.0–7.8)

## 2019-05-25 LAB — TSH: TSH: 3.22 u[IU]/mL (ref 0.35–4.50)

## 2019-06-01 ENCOUNTER — Encounter: Payer: Self-pay | Admitting: Family Medicine

## 2019-06-01 ENCOUNTER — Other Ambulatory Visit: Payer: Self-pay

## 2019-06-01 ENCOUNTER — Ambulatory Visit (INDEPENDENT_AMBULATORY_CARE_PROVIDER_SITE_OTHER): Payer: Medicare Other | Admitting: Family Medicine

## 2019-06-01 VITALS — BP 108/70 | HR 75 | Temp 97.3°F | Ht 69.0 in | Wt 183.6 lb

## 2019-06-01 DIAGNOSIS — I10 Essential (primary) hypertension: Secondary | ICD-10-CM | POA: Diagnosis not present

## 2019-06-01 DIAGNOSIS — E782 Mixed hyperlipidemia: Secondary | ICD-10-CM | POA: Diagnosis not present

## 2019-06-01 DIAGNOSIS — I482 Chronic atrial fibrillation, unspecified: Secondary | ICD-10-CM | POA: Diagnosis not present

## 2019-06-01 DIAGNOSIS — Z23 Encounter for immunization: Secondary | ICD-10-CM

## 2019-06-01 DIAGNOSIS — Z0000 Encounter for general adult medical examination without abnormal findings: Secondary | ICD-10-CM | POA: Diagnosis not present

## 2019-06-01 DIAGNOSIS — M109 Gout, unspecified: Secondary | ICD-10-CM | POA: Diagnosis not present

## 2019-06-01 DIAGNOSIS — Z7189 Other specified counseling: Secondary | ICD-10-CM

## 2019-06-01 NOTE — Progress Notes (Signed)
I have personally reviewed the Medicare Annual Wellness questionnaire and have noted 1. The patient's medical and social history 2. Their use of alcohol, tobacco or illicit drugs 3. Their current medications and supplements 4. The patient's functional ability including ADL's, fall risks, home safety risks and hearing or visual             impairment. 5. Diet and physical activities 6. Evidence for depression or mood disorders  The patients weight, height, BMI have been recorded in the chart and visual acuity is per eye clinic.  I have made referrals, counseling and provided education to the patient based review of the above and I have provided the pt with a written personalized care plan for preventive services.  Provider list updated- see scanned forms.  Routine anticipatory guidance given to patient.  See health maintenance. The possibility exists that previously documented standard health maintenance information may have been brought forward from a previous encounter into this note.  If needed, that same information has been updated to reflect the current situation based on today's encounter.    Flu 2020 Shingles discussed with patient PNA up-to-date per Tetanus 2018 Colon cancer screening deferred given his age.  He agrees. Prostate cancer screening deferred given his age.  He agrees. Advance directive-wife designated if patient were incapacitated. Cognitive function addressed- see scanned forms- and if abnormal then additional documentation follows.   Gout.  No ADE on med, no flares, labs d/w pt.    Hypertension:    Using medication without problems or lightheadedness: yes Chest pain with exertion:no Edema:see below.   Short of breath:no Taking lasix 1/2 tab daily as needed for edema, with swelling controlled with that.    Atrial fibrillation.  Still anticoagulated.  No bleeding.  No adverse effect on medication.  Follows routinely with anticoagulation clinic.  Elevated  Cholesterol: Using medications without problems: yes Muscle aches: yes Diet compliance: yes Exercise: yes, walking.   Labs d/w pt.    PMH and SH reviewed  Meds, vitals, and allergies reviewed.   ROS: Per HPI.  Unless specifically indicated otherwise in HPI, the patient denies:  General: fever. Eyes: acute vision changes ENT: sore throat Cardiovascular: chest pain Respiratory: SOB GI: vomiting GU: dysuria Musculoskeletal: acute back pain Derm: acute rash Neuro: acute motor dysfunction Psych: worsening mood Endocrine: polydipsia Heme: bleeding Allergy: hayfever  GEN: nad, alert and oriented HEENT: mucous membranes moist NECK: supple w/o LA CV: IRR not tachy PULM: ctab, no inc wob ABD: soft, +bs EXT: no edema SKIN: no acute rash  Health Maintenance  Topic Date Due  . TETANUS/TDAP  02/06/2027  . INFLUENZA VACCINE  Completed  . PNA vac Low Risk Adult  Completed

## 2019-06-01 NOTE — Patient Instructions (Addendum)
Check with your insurance to see if they will cover the shingrix shot. Let me know when you need refills.   Update me as needed.  Take care.  Glad to see you.

## 2019-06-04 NOTE — Assessment & Plan Note (Signed)
Gout.  No ADE on med, no flares, labs d/w pt.   continue as is.  He agrees.

## 2019-06-04 NOTE — Assessment & Plan Note (Signed)
Still anticoagulated.  No bleeding.  No adverse effect on medication.  Follows routinely with anticoagulation clinic.

## 2019-06-04 NOTE — Assessment & Plan Note (Signed)
Continue statin.  Labs discussed with patient.  No adverse effect on medication.  He agrees.

## 2019-06-04 NOTE — Assessment & Plan Note (Signed)
Advance directive- wife designated if patient were incapacitated.  

## 2019-06-04 NOTE — Assessment & Plan Note (Addendum)
Flu 2020 Shingles discussed with patient PNA up-to-date per Tetanus 2018 Colon cancer screening deferred given his age.  He agrees. Prostate cancer screening deferred given his age.  He agrees. Advance directive-wife designated if patient were incapacitated. Cognitive function addressed- see scanned forms- and if abnormal then additional documentation follows.

## 2019-06-04 NOTE — Assessment & Plan Note (Signed)
Taking lasix 1/2 tab daily as needed for edema, with swelling controlled with that.   Reasonable control of blood pressure.  Continue as is.  He agrees.

## 2019-06-06 ENCOUNTER — Ambulatory Visit (INDEPENDENT_AMBULATORY_CARE_PROVIDER_SITE_OTHER): Payer: Medicare Other

## 2019-06-06 DIAGNOSIS — I482 Chronic atrial fibrillation, unspecified: Secondary | ICD-10-CM | POA: Diagnosis not present

## 2019-06-06 LAB — POCT INR: INR: 2.4 (ref 2.0–3.0)

## 2019-06-06 NOTE — Patient Instructions (Signed)
INR today:  2.4  Continue taking 1 tablet daily except 1/2 tablet on Mondays and Friday.  Re-check in 3 weeks.  Patient is doing well without any changes in diet, health or medications.

## 2019-06-07 NOTE — Progress Notes (Signed)
Agree. Thanks

## 2019-06-18 ENCOUNTER — Other Ambulatory Visit: Payer: Self-pay | Admitting: Family Medicine

## 2019-06-19 ENCOUNTER — Other Ambulatory Visit: Payer: Self-pay | Admitting: Family Medicine

## 2019-06-28 ENCOUNTER — Other Ambulatory Visit: Payer: Self-pay | Admitting: Family Medicine

## 2019-06-29 ENCOUNTER — Other Ambulatory Visit: Payer: Self-pay

## 2019-06-29 ENCOUNTER — Ambulatory Visit (INDEPENDENT_AMBULATORY_CARE_PROVIDER_SITE_OTHER): Payer: Medicare Other | Admitting: General Practice

## 2019-06-29 DIAGNOSIS — I482 Chronic atrial fibrillation, unspecified: Secondary | ICD-10-CM

## 2019-06-29 LAB — POCT INR: INR: 2.4 (ref 2.0–3.0)

## 2019-06-29 NOTE — Progress Notes (Signed)
Agree. Thanks

## 2019-06-29 NOTE — Patient Instructions (Signed)
Pre visit review using our clinic review tool, if applicable. No additional management support is needed unless otherwise documented below in the visit note.  Continue taking 1 tablet daily except 1/2 tablet on Mondays and Friday.  Re-check in 4 to 5 weeks.

## 2019-07-05 ENCOUNTER — Other Ambulatory Visit: Payer: Self-pay | Admitting: Family Medicine

## 2019-07-05 NOTE — Telephone Encounter (Signed)
Patient is compliant with coumadin management.  Will refill x 6 months.

## 2019-07-27 DIAGNOSIS — M7072 Other bursitis of hip, left hip: Secondary | ICD-10-CM | POA: Diagnosis not present

## 2019-08-03 ENCOUNTER — Other Ambulatory Visit: Payer: Self-pay

## 2019-08-03 ENCOUNTER — Ambulatory Visit (INDEPENDENT_AMBULATORY_CARE_PROVIDER_SITE_OTHER): Payer: Medicare Other | Admitting: General Practice

## 2019-08-03 DIAGNOSIS — I482 Chronic atrial fibrillation, unspecified: Secondary | ICD-10-CM

## 2019-08-03 LAB — POCT INR: INR: 2.5 (ref 2.0–3.0)

## 2019-08-03 NOTE — Patient Instructions (Addendum)
Pre visit review using our clinic review tool, if applicable. No additional management support is needed unless otherwise documented below in the visit note.  Continue taking 1 tablet daily except 1/2 tablet on Mondays and Friday.  Re-check in 4 to 5 weeks.

## 2019-08-06 NOTE — Progress Notes (Signed)
Agree. Thanks

## 2019-08-09 DIAGNOSIS — H903 Sensorineural hearing loss, bilateral: Secondary | ICD-10-CM | POA: Diagnosis not present

## 2019-09-01 NOTE — Progress Notes (Signed)
Cardiology Office Note  Date:  09/04/2019   ID:  Benjamin Soto, DOB 14-Oct-1935, MRN [id number]  PCP:  Tonia Ghent, MD   Chief Complaint  Patient presents with  . office visit    12 mo F/U; Meds verbally reviewed with patient.    HPI:  Benjamin Soto is a pleasant 83 year-old gentleman with a history of chronic atrial fibrillation, on warfarin long smoking hx, stopped in past few months heavy ETOH hx,  remote history of SVT,  dilated cardiomyopathy The estimated ejection fraction was in the range of 20% to 25%. Diffuse hypokinesis.  severe arthritis in his knees,   diabetes,  hyperlipidemia,  who presents for routine followup Of his nonischemic cardiomyopathy  Sedentary, no walking Doing well Tired, broken sleep  Red wine daily , several glasses Wife concerned about his liver and liver enzymes  Legs getting weaker, no falls.  He does have prior history of falls He does have gait instability  Previously with dizziness when he stands up, medications were decreased and dizziness seem to improve Denies chest pain shortness of breath No leg edema abdominal bloating  Had heavy bruising on Eliquis, changed to warfarin, likes it better  Takes Lasix rarely for ankle swelling  EKG personally reviewed by myself on todays visit Shows atrial fibrillation ventricular rate 79 bpm right bundle branch block, left anterior fascicular block  Other past medical history reviewed cardiac catheterization in May 2014 showing nonobstructive CAD, ejection fraction 30-35% Echocardiogram with ejection fraction 20-25%, last evaluated in 2015 History low testosterone in the past, is not on any supplement  Lisinopril was held previously secondary to low blood pressures and dizziness.   Previous episode of confusion, amnesia. Wife witnessed episode 08/09/2013. She felt he was having a stroke. 911 was called and he was transported to [facility].  Carotid ultrasound showed mild bilateral carotid  disease. INR at the time was greater than 2. CT scan of the brain, MRI of the brain showed diffuse cortical atrophy, chronic ischemic white matter disease TIA or stroke. EKG 08/09/2013 showing atrial fibrillation with rate 130 beats per minute. He attributed everything to scalp resection procedure done twice with dermatology prior to the event. He had significant pain around his head prior to the episode. Significant oozing from the wound site. This was fixed on repeat surgery, scraping with additional stitches placed.  Echocardiogram last in mid 2015  showing ejection fraction 20-25%, moderate mitral regurgitation, moderate LVH  CT Scan of his abdomen  showed coronary calcifications.   chest x-ray showed calcified granuloma in the right lower lobe which was not seen in 2012 Labs show total cholesterol less than 100, LDL in the 40 range    PMH:   has a past medical history of Alcohol abuse, Anticoagulated on Coumadin, CAD (coronary artery disease), Diabetes mellitus, type 2 ([place]) (09/21/92), Elbow fracture, right (2014), Gout (04/22/2003), HFrEF (heart failure with reduced ejection fraction) ([place]), History of colon polyps (10/26/02), History of GI bleed, History of tobacco abuse, Hyperlipidemia (05/22/01), Hypertension, Hypogonadism male, Knee pain, right, Mitral regurgitation, Nonischemic cardiomyopathy ([place]), Permanent atrial fibrillation ([place]) (01/20/2003), and TIA (transient ischemic attack).  PSH:    Past Surgical History:  Procedure Laterality Date  . APPENDECTOMY    . BUNIONECTOMY Right 02/2004   R MTP  . [place]   [facility]  . CARDIAC CATHETERIZATION  10/14   [facility]- 50% mid LCx, 40% prox RCA  . CYST EXCISION  08/07/2013   head  .  TONSILLECTOMY      Current Outpatient Medications  Medication Sig Dispense Refill  . allopurinol (ZYLOPRIM) 100 MG tablet TAKE 1 TABLET BY MOUTH EVERY DAY 90 tablet 0  . allopurinol (ZYLOPRIM) 300 MG tablet TAKE 1  TABLET BY MOUTH EVERY DAY 90 tablet 2  . Ascorbic Acid (VITAMIN C) 1000 MG tablet Take 1,000 mg by mouth daily.    . brimonidine (ALPHAGAN) 0.2 % ophthalmic solution Place into both eyes 2 (two) times daily.    . budesonide-formoterol (SYMBICORT) 160-4.5 MCG/ACT inhaler INHALE 2 PUFFS INTO THE LUNGS 2 (TWO) TIMES DAILY. RINSE AFTER USE. (Patient taking differently: as needed. INHALE 2 PUFFS INTO THE LUNGS 2 (TWO) TIMES DAILY. RINSE AFTER USE.) 10.2 Inhaler 5  . carvedilol (COREG) 3.125 MG tablet TAKE 1 TABLET (3.125 MG TOTAL) BY MOUTH 2 (TWO) TIMES DAILY WITH A MEAL. 180 tablet 1  . furosemide (LASIX) 40 MG tablet TAKE 1/2 TABLET BY MOUTH EVERY DAY AS NEEDED. 90 tablet 1  . glucose blood (ONE TOUCH TEST STRIPS) test strip Use once daily 100 each 3  . ipratropium (ATROVENT) 0.06 % nasal spray USE 2 SPRAYS IN EACH NOSTRIL DAILY 15 mL 5  . latanoprost (XALATAN) 0.005 % ophthalmic solution Place 1 drop into both eyes at bedtime.    . simvastatin (ZOCOR) 20 MG tablet TAKE 1 TABLET BY MOUTH EVERY DAY 90 tablet 3  . warfarin (COUMADIN) 5 MG tablet TAKE 1 TABLET DAILY OR AS DIRECTED BY COUMADIN CLINIC. 90 tablet 1   No current facility-administered medications for this visit.     Allergies:   Lisinopril, Tramadol, and Varenicline tartrate   Social History:  The patient  reports that he has quit smoking. His smoking use included cigarettes and pipe. He has a 11.00 pack-year smoking history. He has never used smokeless tobacco. He reports current alcohol use of about 4.0 standard drinks of alcohol per week. He reports that he does not use drugs.   Family History:   family history includes Colon cancer in his daughter; Diabetes in his father; Healthy in his son; Other in his mother.    Review of Systems: Review of Systems  Constitutional: Negative.   Respiratory: Negative.   Cardiovascular: Negative.   Gastrointestinal: Negative.   Musculoskeletal: Negative.   Neurological: Negative.    Psychiatric/Behavioral: Negative.   All other systems reviewed and are negative.    PHYSICAL EXAM: VS:  BP 120/70 (BP Location: Left Arm, Patient Position: Sitting, Cuff Size: Normal)   Pulse 79   Temp (!) 97 F (36.1 C)   Ht 5\' 9"  (1.753 m)   Wt 188 lb 4 oz (85.4 kg)   SpO2 97%   BMI 27.80 kg/m  , BMI Body mass index is 27.8 kg/m. Constitutional:  oriented to person, place, and time. No distress.  HENT:  Head: Grossly normal Eyes:  no discharge. No scleral icterus.  Neck: No JVD, no carotid bruits  Cardiovascular: Regular rate and rhythm, no murmurs appreciated Pulmonary/Chest: Clear to auscultation bilaterally, no wheezes or rails Abdominal: Soft.  no distension.  no tenderness.  Musculoskeletal: Normal range of motion Neurological:  normal muscle tone. Coordination normal. No atrophy Skin: Skin warm and dry Psychiatric: normal affect, pleasant   Recent Labs: 05/25/2019: ALT 17; BUN 19; Creatinine, Ser 1.26; Hemoglobin 13.3; Platelets 164.0; Potassium 4.4; Sodium 138; TSH 3.22    Lipid Panel Lab Results  Component Value Date   CHOL 127 05/25/2019   HDL 54.80 05/25/2019   LDLCALC 50 05/25/2019  TRIG 108.0 05/25/2019      Wt Readings from Last 3 Encounters:  09/04/19 188 lb 4 oz (85.4 kg)  06/01/19 183 lb 9 oz (83.3 kg)  03/23/19 187 lb 5 oz (85 kg)       ASSESSMENT AND PLAN:  Chronic atrial fibrillation ([place]) - Plan: EKG 12-Lead Permanent On warfarin, rate controlled  Mixed hyperlipidemia Cholesterol at goal No medication changes made  Systolic CHF, chronic (HCC)  depressed ejection fraction, presumed to be nonischemic, possibly alcohol related Role of alcohol discussed with him Drinks lots of red wine  ACE inhibitor, ARB previously held for hypotension and dizziness Rarely takes Lasix.   No medication changes made Previously seen by EP for cardiomyopathy  Paroxysmal SVT (supraventricular tachycardia) (HCC) Remote history of SVT, none  recently Stay on carvedilol  Coronary artery disease involving native coronary artery of native heart without angina pectoris Prior smoking history, cholesterol at goal Denies angina  Type 2 diabetes mellitus with complication, without long-term current use of insulin (HCC) Hemoglobin A1c well controlled Recommended regular walking program  Weakness Significant gait instability, work minute walking program Long discussion with him, need to start walking program.  He is very sedentary  Alcohol abuse Lots of red wine daily, discussed with him need for cessation   Total encounter time more than 25 minutes  Greater than 50% was spent in counseling and coordination of care with the patient   Disposition:   F/U  6 months   Orders Placed This Encounter  Procedures  . EKG 12-Lead     Signed, Esmond Plants, M.D., Ph.D. 09/04/2019  [place], [place]

## 2019-09-04 ENCOUNTER — Ambulatory Visit (INDEPENDENT_AMBULATORY_CARE_PROVIDER_SITE_OTHER): Payer: Medicare Other | Admitting: Cardiovascular Disease

## 2019-09-04 ENCOUNTER — Encounter: Payer: Self-pay | Admitting: Cardiovascular Disease

## 2019-09-04 ENCOUNTER — Other Ambulatory Visit: Payer: Self-pay

## 2019-09-04 VITALS — BP 120/70 | HR 79 | Temp 97.0°F | Ht 69.0 in | Wt 188.2 lb

## 2019-09-04 DIAGNOSIS — I5022 Chronic systolic (congestive) heart failure: Secondary | ICD-10-CM | POA: Diagnosis not present

## 2019-09-04 DIAGNOSIS — E118 Type 2 diabetes mellitus with unspecified complications: Secondary | ICD-10-CM

## 2019-09-04 DIAGNOSIS — F1010 Alcohol abuse, uncomplicated: Secondary | ICD-10-CM

## 2019-09-04 DIAGNOSIS — I951 Orthostatic hypotension: Secondary | ICD-10-CM | POA: Diagnosis not present

## 2019-09-04 DIAGNOSIS — E782 Mixed hyperlipidemia: Secondary | ICD-10-CM

## 2019-09-04 DIAGNOSIS — I482 Chronic atrial fibrillation, unspecified: Secondary | ICD-10-CM | POA: Diagnosis not present

## 2019-09-04 DIAGNOSIS — I471 Supraventricular tachycardia: Secondary | ICD-10-CM

## 2019-09-04 NOTE — Patient Instructions (Signed)

## 2019-09-07 ENCOUNTER — Other Ambulatory Visit: Payer: Self-pay

## 2019-09-07 ENCOUNTER — Ambulatory Visit (INDEPENDENT_AMBULATORY_CARE_PROVIDER_SITE_OTHER): Payer: Medicare Other | Admitting: General Practice

## 2019-09-07 ENCOUNTER — Other Ambulatory Visit: Payer: Self-pay | Admitting: General Practice

## 2019-09-07 DIAGNOSIS — I482 Chronic atrial fibrillation, unspecified: Secondary | ICD-10-CM | POA: Diagnosis not present

## 2019-09-07 LAB — POCT INR: INR: 2.7 (ref 2.0–3.0)

## 2019-09-07 NOTE — Patient Instructions (Addendum)
Pre visit review using our clinic review tool, if applicable. No additional management support is needed unless otherwise documented below in the visit note.  Continue taking 1 tablet daily except 1/2 tablet on Mondays and Friday.  Re-check in 6 weeks.

## 2019-09-10 ENCOUNTER — Other Ambulatory Visit: Payer: Self-pay | Admitting: Family Medicine

## 2019-09-10 NOTE — Progress Notes (Signed)
Agree. Thanks

## 2019-09-18 ENCOUNTER — Other Ambulatory Visit: Payer: Self-pay | Admitting: Family Medicine

## 2019-09-29 ENCOUNTER — Other Ambulatory Visit: Payer: Self-pay | Admitting: Family Medicine

## 2019-10-03 ENCOUNTER — Other Ambulatory Visit: Payer: Self-pay | Admitting: Lab

## 2019-10-03 ENCOUNTER — Telehealth: Payer: Self-pay | Admitting: Family Medicine

## 2019-10-03 NOTE — Telephone Encounter (Signed)
Patient stated the pharmacy is needing new scripts sent over to them  They need scripts for the following   allopurinol (ZYLOPRIM) 300 MG tablet brimonidine (ALPHAGAN) 0.2 % ophthalmic solution budesonide-formoterol (SYMBICORT) 160-4.5 MCG/ACT inhaler furosemide (LASIX) 40 MG tablet carvedilol (COREG) 3.125 MG tablet ipratropium (ATROVENT) 0.06 % nasal spray latanoprost (XALATAN) 0.005 % ophthalmic solution simvastatin (ZOCOR) 20 MG tablet warfarin (COUMADIN) 5 MG tablet    [id number] - [place], [place] [address]

## 2019-10-03 NOTE — Telephone Encounter (Signed)
Patient stated the pharmacy is needing new scripts sent over to them  They need scripts for the following   allopurinol (ZYLOPRIM) 300 MG tablet brimonidine (ALPHAGAN) 0.2 % ophthalmic solution budesonide-formoterol (SYMBICORT) 160-4.5 MCG/ACT inhaler furosemide (LASIX) 40 MG tablet  carvedilol (COREG) 3.125 MG tablet ipratropium (ATROVENT) 0.06 % nasal spray latanoprost (XALATAN) 0.005 % ophthalmic solution simvastatin (ZOCOR) 20 MG tablet warfarin (COUMADIN) 5 MG tablet    [id number] - [place], [place] - [address]

## 2019-10-04 MED ORDER — WARFARIN SODIUM 5 MG PO TABS: ORAL_TABLET | ORAL | 2 refills | Status: DC

## 2019-10-04 MED ORDER — IPRATROPIUM BROMIDE 0.06 % NA SOLN: NASAL | 5 refills | Status: DC

## 2019-10-04 MED ORDER — SIMVASTATIN 20 MG PO TABS: 20.0000 mg | ORAL_TABLET | Freq: Every day | ORAL | 3 refills | Status: DC

## 2019-10-04 MED ORDER — BUDESONIDE-FORMOTEROL FUMARATE 160-4.5 MCG/ACT IN AERO: INHALATION_SPRAY | RESPIRATORY_TRACT | 12 refills | Status: DC

## 2019-10-04 MED ORDER — CARVEDILOL 3.125 MG PO TABS: 3.1250 mg | ORAL_TABLET | Freq: Two times a day (BID) | ORAL | 3 refills | Status: DC

## 2019-10-04 MED ORDER — FUROSEMIDE 40 MG PO TABS: ORAL_TABLET | ORAL | 1 refills | Status: DC

## 2019-10-04 MED ORDER — ALLOPURINOL 300 MG PO TABS: 300.0000 mg | ORAL_TABLET | Freq: Every day | ORAL | 3 refills | Status: DC

## 2019-10-04 NOTE — Telephone Encounter (Signed)
Patient advised of everything. 

## 2019-10-04 NOTE — Telephone Encounter (Signed)
I sent 7 of the 9 prescriptions.  The 2 prescriptions for eyedrops need to come through his eye clinic.  Thanks.

## 2019-10-06 DIAGNOSIS — E119 Type 2 diabetes mellitus without complications: Secondary | ICD-10-CM | POA: Diagnosis not present

## 2019-10-06 DIAGNOSIS — H04123 Dry eye syndrome of bilateral lacrimal glands: Secondary | ICD-10-CM | POA: Diagnosis not present

## 2019-10-06 DIAGNOSIS — H401131 Primary open-angle glaucoma, bilateral, mild stage: Secondary | ICD-10-CM | POA: Diagnosis not present

## 2019-10-06 DIAGNOSIS — Z961 Presence of intraocular lens: Secondary | ICD-10-CM | POA: Diagnosis not present

## 2019-10-19 ENCOUNTER — Ambulatory Visit (INDEPENDENT_AMBULATORY_CARE_PROVIDER_SITE_OTHER): Payer: Medicare Other

## 2019-10-19 ENCOUNTER — Other Ambulatory Visit: Payer: Self-pay

## 2019-10-19 DIAGNOSIS — I482 Chronic atrial fibrillation, unspecified: Secondary | ICD-10-CM

## 2019-10-19 DIAGNOSIS — Z7901 Long term (current) use of anticoagulants: Secondary | ICD-10-CM

## 2019-10-19 LAB — POCT INR: INR: 2.4 (ref 2.0–3.0)

## 2019-10-19 NOTE — Patient Instructions (Addendum)
.  Pre visit review using our clinic review tool, if applicable. No additional management support is needed unless otherwise documented below in the visit note.   Continue taking 1 tablet daily except 1/2 tablet on Mondays and Friday.  Re-check in 6 weeks.

## 2019-10-22 NOTE — Progress Notes (Signed)
Agree. Thanks

## 2019-10-25 ENCOUNTER — Other Ambulatory Visit: Payer: Self-pay | Admitting: Family Medicine

## 2019-10-25 MED ORDER — ALLOPURINOL 100 MG PO TABS: 100.0000 mg | ORAL_TABLET | Freq: Every day | ORAL | 1 refills | Status: DC

## 2019-10-25 MED ORDER — SIMVASTATIN 20 MG PO TABS: 20.0000 mg | ORAL_TABLET | Freq: Every day | ORAL | 1 refills | Status: DC

## 2019-10-25 NOTE — Telephone Encounter (Signed)
Patient stated he is in the processes of moving to a new pharmacy When speaking to them about getting his prescriptions moved they advised him to call our office to get new scripts sent over to the for the following   Simvastatin Allopurinol 100mg     [place]

## 2019-10-25 NOTE — Telephone Encounter (Signed)
Medication sent to new pharmacy

## 2019-10-30 ENCOUNTER — Ambulatory Visit: Payer: Medicare Other | Attending: Internal Medicine

## 2019-10-30 DIAGNOSIS — Z23 Encounter for immunization: Secondary | ICD-10-CM | POA: Insufficient documentation

## 2019-10-30 NOTE — Progress Notes (Signed)
   Covid-19 Vaccination Clinic  Name:  Skylin Roome    MRN: [id number] DOB: 14-Jun-1936  10/30/2019  Mr. Vanweelden was observed post Covid-19 immunization for 15 minutes without incidence. He was provided with Vaccine Information Sheet and instruction to access the V-Safe system.   Mr. Nilo was instructed to call 911 with any severe reactions post vaccine: Marland Kitchen Difficulty breathing  . Swelling of your face and throat  . A fast heartbeat  . A bad rash all over your body  . Dizziness and weakness    Immunizations Administered    Name Date Dose VIS Date Route   Pfizer COVID-19 Vaccine 10/30/2019  8:17 AM 0.3 mL 09/01/2019 Intramuscular   Manufacturer: [place]   Lot: [id number]   [place]: [id number]

## 2019-11-07 ENCOUNTER — Other Ambulatory Visit: Payer: Self-pay | Admitting: *Deleted

## 2019-11-07 MED ORDER — SYMBICORT 160-4.5 MCG/ACT IN AERO: INHALATION_SPRAY | RESPIRATORY_TRACT | 12 refills | Status: DC

## 2019-11-22 ENCOUNTER — Ambulatory Visit: Payer: Medicare Other | Attending: Internal Medicine

## 2019-11-22 DIAGNOSIS — Z23 Encounter for immunization: Secondary | ICD-10-CM | POA: Insufficient documentation

## 2019-11-22 NOTE — Progress Notes (Signed)
   Covid-19 Vaccination Clinic  Name:  Devron Housden    MRN: [id number] DOB: [birth year]  11/22/2019  Mr. Glad was observed post Covid-19 immunization for 15 minutes without incident. He was provided with Vaccine Information Sheet and instruction to access the V-Safe system.   Mr. Chaffins was instructed to call 911 with any severe reactions post vaccine: Marland Kitchen Difficulty breathing  . Swelling of face and throat  . A fast heartbeat  . A bad rash all over body  . Dizziness and weakness   Immunizations Administered    Name Date Dose VIS Date Route   Pfizer COVID-19 Vaccine 11/22/2019  9:12 AM 0.3 mL 09/01/2019 Intramuscular   Manufacturer: [place]   Lot: [id number]   [place]: [id number]

## 2019-11-30 ENCOUNTER — Ambulatory Visit (INDEPENDENT_AMBULATORY_CARE_PROVIDER_SITE_OTHER): Payer: Medicare Other

## 2019-11-30 ENCOUNTER — Other Ambulatory Visit: Payer: Self-pay

## 2019-11-30 DIAGNOSIS — Z7901 Long term (current) use of anticoagulants: Secondary | ICD-10-CM | POA: Diagnosis not present

## 2019-11-30 DIAGNOSIS — I482 Chronic atrial fibrillation, unspecified: Secondary | ICD-10-CM

## 2019-11-30 LAB — POCT INR: INR: 2.7 (ref 2.0–3.0)

## 2019-11-30 NOTE — Patient Instructions (Addendum)
Pre visit review using our clinic review tool, if applicable. No additional management support is needed unless otherwise documented below in the visit note.  Continue taking 1 tablet daily except 1/2 tablet on Mondays and Friday.  Re-check in 6 weeks.

## 2019-12-03 NOTE — Progress Notes (Addendum)
Agree. Thanks

## 2019-12-26 ENCOUNTER — Other Ambulatory Visit: Payer: Self-pay | Admitting: *Deleted

## 2019-12-26 ENCOUNTER — Telehealth: Payer: Self-pay | Admitting: Family Medicine

## 2019-12-26 MED ORDER — ALLOPURINOL 100 MG PO TABS: 100.0000 mg | ORAL_TABLET | Freq: Every day | ORAL | 1 refills | Status: DC

## 2019-12-26 NOTE — Telephone Encounter (Signed)
That has already been done earlier this morning.

## 2019-12-26 NOTE — Telephone Encounter (Signed)
Pt left a voicemail requesting a refill of his Allopurinol 300mg   [place].  Please advise, thanks.

## 2019-12-26 NOTE — Telephone Encounter (Signed)
Pt called his insurance changed pharmacy needs refill of allopurinol 300 mg Sent to [facility] [address] Pt has only 4 pills left

## 2019-12-26 NOTE — Telephone Encounter (Signed)
Rx sent to pharmacy as requested.

## 2019-12-28 ENCOUNTER — Other Ambulatory Visit: Payer: Self-pay

## 2019-12-28 NOTE — Telephone Encounter (Signed)
Pt is asking for Xalatan eye drops sent to [facility] [address]. He says Dr Damita Dunnings has been writing it for several years.

## 2019-12-29 NOTE — Telephone Encounter (Signed)
Patient advised.

## 2019-12-29 NOTE — Telephone Encounter (Signed)
I don't see where I had written for this prev.  Needs to come through the eye clinic.  Thanks.

## 2020-01-04 DIAGNOSIS — H04123 Dry eye syndrome of bilateral lacrimal glands: Secondary | ICD-10-CM | POA: Diagnosis not present

## 2020-01-04 DIAGNOSIS — Z961 Presence of intraocular lens: Secondary | ICD-10-CM | POA: Diagnosis not present

## 2020-01-04 DIAGNOSIS — E119 Type 2 diabetes mellitus without complications: Secondary | ICD-10-CM | POA: Diagnosis not present

## 2020-01-04 DIAGNOSIS — H401131 Primary open-angle glaucoma, bilateral, mild stage: Secondary | ICD-10-CM | POA: Diagnosis not present

## 2020-01-11 ENCOUNTER — Ambulatory Visit (INDEPENDENT_AMBULATORY_CARE_PROVIDER_SITE_OTHER): Payer: Medicare Other

## 2020-01-11 ENCOUNTER — Other Ambulatory Visit: Payer: Self-pay

## 2020-01-11 DIAGNOSIS — Z7901 Long term (current) use of anticoagulants: Secondary | ICD-10-CM | POA: Diagnosis not present

## 2020-01-11 DIAGNOSIS — I482 Chronic atrial fibrillation, unspecified: Secondary | ICD-10-CM

## 2020-01-11 LAB — POCT INR: INR: 2.5 (ref 2.0–3.0)

## 2020-01-11 NOTE — Patient Instructions (Addendum)
Pre visit review using our clinic review tool, if applicable. No additional management support is needed unless otherwise documented below in the visit note.  Continue taking 1 tablet daily except 1/2 tablet on Mondays and Friday.  Re-check in 6 weeks.

## 2020-01-12 NOTE — Progress Notes (Signed)
Agree. Thanks

## 2020-02-22 ENCOUNTER — Other Ambulatory Visit: Payer: Self-pay

## 2020-02-22 ENCOUNTER — Ambulatory Visit (INDEPENDENT_AMBULATORY_CARE_PROVIDER_SITE_OTHER): Payer: Medicare Other

## 2020-02-22 DIAGNOSIS — Z7901 Long term (current) use of anticoagulants: Secondary | ICD-10-CM | POA: Diagnosis not present

## 2020-02-22 DIAGNOSIS — I482 Chronic atrial fibrillation, unspecified: Secondary | ICD-10-CM

## 2020-02-22 LAB — POCT INR: INR: 2.6 (ref 2.0–3.0)

## 2020-02-22 NOTE — Patient Instructions (Addendum)
Pre visit review using our clinic review tool, if applicable. No additional management support is needed unless otherwise documented below in the visit note.  Continue taking 1 tablet daily except 1/2 tablet on Mondays and Friday.  Re-check in 6 weeks.

## 2020-02-25 NOTE — Progress Notes (Signed)
Agree. Thanks

## 2020-03-04 NOTE — Progress Notes (Signed)
Cardiology Office Note  Date:  03/05/2020   ID:  Benjamin Soto, DOB 26-Nov-1935, MRN [id number]  PCP:  Benjamin Ghent, MD   Chief Complaint  Patient presents with  . Other    6 month follow up. Meds reviewed verbally with patient.     HPI:  Benjamin Soto is a pleasant 84 year-old gentleman with a history of chronic atrial fibrillation, on warfarin long smoking hx, stopped in past few months heavy ETOH hx,  remote history of SVT,  dilated cardiomyopathy The estimated ejection fraction was in the range of 20% to 25%. Diffuse hypokinesis.  severe arthritis in his knees,   diabetes,  hyperlipidemia,  who presents for routine followup Of his nonischemic cardiomyopathy  In follow-up today he reports he is doing well INR typically 2-3 Previously had self-reported excessive bruising on Eliquis  No orthostasis, BP low today 110 Some balance issue, sedentary at home No falls Wheel chair to the office No regular walking at home  In the past reported Red wine daily , several glasses  Tired, broken sleep in the past  Denies chest pain shortness of breath No leg edema or  abdominal bloating   Lasix rarely for ankle swelling, sparingly  EKG personally reviewed by myself on todays visit Shows atrial fibrillation ventricular rate 72 bpm right bundle branch block, left anterior fascicular block  Other past medical history reviewed cardiac catheterization in May 2014 showing nonobstructive CAD, ejection fraction 30-35% Echocardiogram with ejection fraction 20-25%, last evaluated in 2015 History low testosterone in the past, is not on any supplement  Lisinopril was held previously secondary to low blood pressures and dizziness.   Previous episode of confusion, amnesia. Wife witnessed episode 08/09/2013. She felt he was having a stroke. 911 was called and he was transported to [facility].  Carotid ultrasound showed mild bilateral carotid disease. INR at the time was greater than 2. CT scan  of the brain, MRI of the brain showed diffuse cortical atrophy, chronic ischemic white matter disease TIA or stroke. EKG 08/09/2013 showing atrial fibrillation with rate 130 beats per minute. He attributed everything to scalp resection procedure done twice with dermatology prior to the event. He had significant pain around his head prior to the episode. Significant oozing from the wound site. This was fixed on repeat surgery, scraping with additional stitches placed.  Echocardiogram last in mid 2015  showing ejection fraction 20-25%, moderate mitral regurgitation, moderate LVH  CT Scan of his abdomen  showed coronary calcifications.   chest x-ray showed calcified granuloma in the right lower lobe which was not seen in 2012 Labs show total cholesterol less than 100, LDL in the 40 range    PMH:   has a past medical history of Alcohol abuse, Anticoagulated on Coumadin, CAD (coronary artery disease), Diabetes mellitus, type 2 ([place]) (09/21/92), Elbow fracture, right (2014), Gout (04/22/2003), HFrEF (heart failure with reduced ejection fraction) ([place]), History of colon polyps (10/26/02), History of GI bleed, History of tobacco abuse, Hyperlipidemia (05/22/01), Hypertension, Hypogonadism male, Knee pain, right, Mitral regurgitation, Nonischemic cardiomyopathy ([place]), Permanent atrial fibrillation ([place]) (01/20/2003), and TIA (transient ischemic attack).  PSH:    Past Surgical History:  Procedure Laterality Date  . APPENDECTOMY    . BUNIONECTOMY Right 02/2004   R MTP  . [place]   normal, city hospital by [facility]  . CARDIAC CATHETERIZATION  10/14   [facility]- 50% mid LCx, 40% prox RCA  . CYST EXCISION  08/07/2013   head  . TONSILLECTOMY  Current Outpatient Medications  Medication Sig Dispense Refill  . allopurinol (ZYLOPRIM) 100 MG tablet Take 1 tablet (100 mg total) by mouth daily. 90 tablet 1  . allopurinol (ZYLOPRIM) 300 MG tablet Take 1 tablet (300 mg total) by mouth daily. 90  tablet 3  . Ascorbic Acid (VITAMIN C) 1000 MG tablet Take 1,000 mg by mouth daily.    . brimonidine (ALPHAGAN) 0.2 % ophthalmic solution Place into both eyes 2 (two) times daily.    . carvedilol (COREG) 3.125 MG tablet Take 1 tablet (3.125 mg total) by mouth 2 (two) times daily with a meal. 180 tablet 3  . furosemide (LASIX) 40 MG tablet TAKE 1/2 TABLET BY MOUTH EVERY DAY AS NEEDED. 90 tablet 1  . ipratropium (ATROVENT) 0.06 % nasal spray SPRAY 2 SPRAYS INTO EACH NOSTRIL EVERY DAY 15 mL 5  . latanoprost (XALATAN) 0.005 % ophthalmic solution Place 1 drop into both eyes at bedtime.    . simvastatin (ZOCOR) 20 MG tablet Take 1 tablet (20 mg total) by mouth daily. 90 tablet 1  . SYMBICORT 160-4.5 MCG/ACT inhaler INHALE 2 PUFFS INTO THE LUNGS 2 (TWO) TIMES DAILY. RINSE AFTER USE. 10.2 Inhaler 12  . warfarin (COUMADIN) 5 MG tablet Take as directed by anticoagulation clinic. 90 tablet 2   No current facility-administered medications for this visit.     Allergies:   Lisinopril, Tramadol, and Varenicline tartrate   Social History:  The patient  reports that he has quit smoking. His smoking use included cigarettes and pipe. He has a 11.00 pack-year smoking history. He has never used smokeless tobacco. He reports current alcohol use of about 4.0 standard drinks of alcohol per week. He reports that he does not use drugs.   Family History:   family history includes Colon cancer in his daughter; Diabetes in his father; Healthy in his son; Other in his mother.    Review of Systems: Review of Systems  Constitutional: Negative.   Respiratory: Negative.   Cardiovascular: Negative.   Gastrointestinal: Negative.   Musculoskeletal: Negative.   Neurological: Negative.   Psychiatric/Behavioral: Negative.   All other systems reviewed and are negative.    PHYSICAL EXAM: VS:  BP 110/76 (BP Location: Left Arm, Patient Position: Sitting, Cuff Size: Normal)   Pulse 72   Ht 5\' 9"  (1.753 m)   Wt 186 lb (84.4  kg)   SpO2 97%   BMI 27.47 kg/m  , BMI Body mass index is 27.47 kg/m. Constitutional:  oriented to person, place, and time. No distress.  HENT:  Head: Grossly normal Eyes:  no discharge. No scleral icterus.  Neck: No JVD, no carotid bruits  Cardiovascular: Regular rate and rhythm, no murmurs appreciated Pulmonary/Chest: Clear to auscultation bilaterally, no wheezes or rails Abdominal: Soft.  no distension.  no tenderness.  Musculoskeletal: Normal range of motion Neurological:  normal muscle tone. Coordination normal. No atrophy Skin: Skin warm and dry Psychiatric: normal affect, pleasant   Recent Labs: 05/25/2019: ALT 17; BUN 19; Creatinine, Ser 1.26; Hemoglobin 13.3; Platelets 164.0; Potassium 4.4; Sodium 138; TSH 3.22    Lipid Panel Lab Results  Component Value Date   CHOL 127 05/25/2019   HDL 54.80 05/25/2019   LDLCALC 50 05/25/2019   TRIG 108.0 05/25/2019      Wt Readings from Last 3 Encounters:  03/05/20 186 lb (84.4 kg)  09/04/19 188 lb 4 oz (85.4 kg)  06/01/19 183 lb 9 oz (83.3 kg)       ASSESSMENT AND PLAN:  Chronic atrial fibrillation (HCC) - Plan: EKG 12-Lead Permanent On warfarin, rate controlled No changes made  Mixed hyperlipidemia Cholesterol at goal No medication changes made  Systolic CHF, chronic (HCC)  depressed ejection fraction, presumed to be nonischemic, possibly alcohol related Role of alcohol discussed with him Previously declined ischemic work-up, catheterization Previously reported drinking lots of red wine  ACE inhibitor, ARB previously held for hypotension and dizziness Rarely takes Lasix.  In general has appeared euvolemic on a consistent basis No medication changes made given low blood pressure Previously seen by EP for cardiomyopathy Repeat echocardiogram was offered, he has declined at this time  Paroxysmal SVT (supraventricular tachycardia) (HCC) Remote history of SVT,  Stay on carvedilol Denies any new  symptoms  Coronary artery disease involving native coronary artery of native heart without angina pectoris Prior smoking history, cholesterol at goal Denies angina  Type 2 diabetes mellitus with complication, without long-term current use of insulin ([place]) Recommended regular walking program Avoid alcohol  Weakness Significant gait instability, very sedentary Recommended walking program  Alcohol abuse Lots of red wine daily, we have previously discussed with him need for cessation   Total encounter time more than 25 minutes  Greater than 50% was spent in counseling and coordination of care with the patient   Disposition:   F/U  12 months, at his request   Orders Placed This Encounter  Procedures  . EKG 12-Lead     Signed, Esmond Plants, M.D., Ph.D. 03/05/2020  [place], [place]

## 2020-03-05 ENCOUNTER — Encounter: Payer: Self-pay | Admitting: Cardiovascular Disease

## 2020-03-05 ENCOUNTER — Ambulatory Visit: Payer: Medicare Other | Admitting: Cardiovascular Disease

## 2020-03-05 ENCOUNTER — Other Ambulatory Visit: Payer: Self-pay

## 2020-03-05 VITALS — BP 110/76 | HR 72 | Ht 69.0 in | Wt 186.0 lb

## 2020-03-05 DIAGNOSIS — E782 Mixed hyperlipidemia: Secondary | ICD-10-CM

## 2020-03-05 DIAGNOSIS — E118 Type 2 diabetes mellitus with unspecified complications: Secondary | ICD-10-CM | POA: Diagnosis not present

## 2020-03-05 DIAGNOSIS — I482 Chronic atrial fibrillation, unspecified: Secondary | ICD-10-CM | POA: Diagnosis not present

## 2020-03-05 DIAGNOSIS — I951 Orthostatic hypotension: Secondary | ICD-10-CM

## 2020-03-05 DIAGNOSIS — F1010 Alcohol abuse, uncomplicated: Secondary | ICD-10-CM

## 2020-03-05 DIAGNOSIS — I471 Supraventricular tachycardia: Secondary | ICD-10-CM | POA: Diagnosis not present

## 2020-03-05 DIAGNOSIS — I5022 Chronic systolic (congestive) heart failure: Secondary | ICD-10-CM

## 2020-03-05 NOTE — Patient Instructions (Signed)

## 2020-03-26 ENCOUNTER — Other Ambulatory Visit: Payer: Self-pay

## 2020-03-26 ENCOUNTER — Ambulatory Visit (INDEPENDENT_AMBULATORY_CARE_PROVIDER_SITE_OTHER): Payer: Medicare Other | Admitting: Family Medicine

## 2020-03-26 ENCOUNTER — Encounter: Payer: Self-pay | Admitting: Family Medicine

## 2020-03-26 DIAGNOSIS — S51019A Laceration without foreign body of unspecified elbow, initial encounter: Secondary | ICD-10-CM | POA: Diagnosis not present

## 2020-03-26 NOTE — Patient Instructions (Signed)
Use a hydrocolloid dressing in the meantime.  It can stay on for a few days.   Pat dry if you get it wet in the shower.   Gently remove in a few days.   Replace if needed.   Update me as needed.  Take care.  Glad to see you.

## 2020-03-26 NOTE — Progress Notes (Signed)
This visit occurred during the SARS-CoV-2 public health emergency.  Safety protocols were in place, including screening questions prior to the visit, additional usage of staff PPE, and extensive cleaning of exam room while observing appropriate contact time as indicated for disinfecting solutions.  L arm, lesion proximal to the olecranon.  Scraped on door frame then it got larger in the meantime when he scraped it again accidentally.  He has thin skin at baseline.  Not painful now.  Still on coumadin.  No FCNAVD.  Scant amount of yellowish discharge on the bandage each AM.  Tdap 2018.  The wound seems to get too wet if he uses silvadene with an occlusive dressing.  No recent symbicort use or needed.    nad ncat Left elbow with normal range of motion.  Skin intact except for a clean appearing 2x4 cm superficial abrasion without spreading erythema or purulent discharge.  Located proximal to the left olecranon.

## 2020-03-28 NOTE — Assessment & Plan Note (Signed)
Discussed options.  There is no flap to fold back over.  It does not appear infected.  The lesion seems to be getting too wet with using Silvadene.  He can use a hydrocolloid dressing for a few days and then change as needed.  Routine cautions given to patient.  He will update me as needed.

## 2020-04-04 ENCOUNTER — Ambulatory Visit (INDEPENDENT_AMBULATORY_CARE_PROVIDER_SITE_OTHER): Payer: Medicare Other

## 2020-04-04 ENCOUNTER — Other Ambulatory Visit: Payer: Self-pay

## 2020-04-04 DIAGNOSIS — Z7901 Long term (current) use of anticoagulants: Secondary | ICD-10-CM | POA: Diagnosis not present

## 2020-04-04 DIAGNOSIS — I482 Chronic atrial fibrillation, unspecified: Secondary | ICD-10-CM

## 2020-04-04 LAB — POCT INR: INR: 3.2 — AB (ref 2.0–3.0)

## 2020-04-04 NOTE — Patient Instructions (Addendum)
Pre visit review using our clinic review tool, if applicable. No additional management support is needed unless otherwise documented below in the visit note.  Take 2.5mg  today then continue taking 1 tablet daily except 1/2 tablet on Mondays and Friday.  Re-check in 5 weeks.

## 2020-04-07 NOTE — Progress Notes (Signed)
Agree. Thanks

## 2020-04-12 ENCOUNTER — Ambulatory Visit (INDEPENDENT_AMBULATORY_CARE_PROVIDER_SITE_OTHER): Payer: Medicare Other | Admitting: Family Medicine

## 2020-04-12 ENCOUNTER — Encounter: Payer: Self-pay | Admitting: Family Medicine

## 2020-04-12 ENCOUNTER — Other Ambulatory Visit: Payer: Self-pay

## 2020-04-12 VITALS — BP 120/82 | HR 78 | Temp 96.6°F | Ht 69.0 in | Wt 181.0 lb

## 2020-04-12 DIAGNOSIS — E118 Type 2 diabetes mellitus with unspecified complications: Secondary | ICD-10-CM

## 2020-04-12 DIAGNOSIS — L03116 Cellulitis of left lower limb: Secondary | ICD-10-CM | POA: Insufficient documentation

## 2020-04-12 DIAGNOSIS — S81802S Unspecified open wound, left lower leg, sequela: Secondary | ICD-10-CM | POA: Diagnosis not present

## 2020-04-12 MED ORDER — CEPHALEXIN 500 MG PO CAPS: 500.0000 mg | ORAL_CAPSULE | Freq: Three times a day (TID) | ORAL | 0 refills | Status: DC

## 2020-04-12 NOTE — Assessment & Plan Note (Signed)
Minimal cellulitis. Small amount pus discharge expressed at superior wound scar.  Send for culture but  Treat with keflex x 7 days.

## 2020-04-12 NOTE — Patient Instructions (Signed)
Keep area clear and dry.  Wash with antibacteria soap.  Complete antibiotics. keflex x 7 days.  We will call with wound culture.  Elevated left leg above heart as able.  Call if redness spreading or if not improving as expected.

## 2020-04-12 NOTE — Addendum Note (Signed)
Addended by: Loreen Freud on: 04/12/2020 03:49 PM   Modules accepted: Orders

## 2020-04-12 NOTE — Assessment & Plan Note (Signed)
Well controlled 

## 2020-04-12 NOTE — Progress Notes (Signed)
Chief Complaint  Patient presents with  . Wound Check    left shin    History of Present Illness: HPI  84 year old male patient of Dr. Josefine Class  with history of afib on anticoagulation, and well controlled DM presents with  History ( years AGO) of wound on left shin after hitting with a trailer .Marland Kitchen He was treated with wound clinic. Took 5 months to heal. No sutures. Treated with antibiotics.   He reports  He has noted in last 6 months there raised area on top of wound, left  No redness, no swelling, no pain. No increase in warmth.  Now in last 3 week he has noted clear liquid, very small amount.  He has noted swelling in last 2-3 days.  Lab Results  Component Value Date   HGBA1C 6.0 05/25/2019    No boils in family. No MRSA exposure.  This visit occurred during the SARS-CoV-2 public health emergency.  Safety protocols were in place, including screening questions prior to the visit, additional usage of staff PPE, and extensive cleaning of exam room while observing appropriate contact time as indicated for disinfecting solutions.   COVID 19 screen:  No recent travel or known exposure to COVID19 The patient denies respiratory symptoms of COVID 19 at this time. The importance of social distancing was discussed today.     Review of Systems  Constitutional: Negative for chills and fever.  HENT: Negative for congestion and ear pain.   Eyes: Negative for pain and redness.  Respiratory: Negative for cough and shortness of breath.   Cardiovascular: Negative for chest pain, palpitations and leg swelling.  Gastrointestinal: Negative for abdominal pain, blood in stool, constipation, diarrhea, nausea and vomiting.  Genitourinary: Negative for dysuria.  Musculoskeletal: Negative for falls and myalgias.  Skin: Negative for rash.  Neurological: Negative for dizziness.  Psychiatric/Behavioral: Negative for depression. The patient is not nervous/anxious.       Past Medical History:    Diagnosis Date  . Alcohol abuse   . Anticoagulated on Coumadin   . CAD (coronary artery disease)    a. 06/2013 Cath: LM nl, LAD nl, LCX 80m, RCA 40p, EF 25-30%.  . Diabetes mellitus, type 2 ([place]) 09/21/92  . Elbow fracture, right 2014   "healed by itself"  . Gout 04/22/2003   per Dr Jefm Bryant  . HFrEF (heart failure with reduced ejection fraction) ([place])    a.  02/2014 Echo: EF 20-25%, diff HK; b. 04/2014 CPX test: primary cirulatory limitation 2/2 advanced CHF.  Marland Kitchen History of colon polyps 10/26/02   colonoscopy  . History of GI bleed    a. 08/2013 Seen by GI at [facility]- felt to be diverticular in origin. Resolved off Coumadin. Cleared to resume per GI.  Marland Kitchen History of tobacco abuse   . Hyperlipidemia 05/22/01  . Hypertension   . Hypogonadism male   . Knee pain, right    [facility], Dr Jefm Bryant with rheumatology  . Mitral regurgitation    a. 02/2014 Echo: mild to mod MR.  . Nonischemic cardiomyopathy ([place])    a. 06/2013 Cath: nonobs dzs, EF 20-25; b. 02/2014 Echo: EF 20-25%, diff HK, mild AI, mild to mod MR, mod dil LA, mildly reduced RV fxn, mildly dil RA, PASP wnl.  . Permanent atrial fibrillation ([place]) 01/20/2003   a. CHA2DS2VASc = 7-->coumadin.  Marland Kitchen TIA (transient ischemic attack)    a. 07/2013 Admitted w/ transient AMS->No stroke;  b. 08/2013 Carotid U/S: <50% bilat ICA stenosis.  reports that he has quit smoking. His smoking use included cigarettes and pipe. He has a 11.00 pack-year smoking history. He has never used smokeless tobacco. He reports current alcohol use of about 4.0 standard drinks of alcohol per week. He reports that he does not use drugs.   Current Outpatient Medications:  .  allopurinol (ZYLOPRIM) 100 MG tablet, Take 1 tablet (100 mg total) by mouth daily., Disp: 90 tablet, Rfl: 1 .  allopurinol (ZYLOPRIM) 300 MG tablet, Take 1 tablet (300 mg total) by mouth daily., Disp: 90 tablet, Rfl: 3 .  Ascorbic Acid (VITAMIN C) 1000 MG tablet, Take 1,000 mg by mouth daily.,  Disp: , Rfl:  .  brimonidine (ALPHAGAN) 0.2 % ophthalmic solution, Place into both eyes 2 (two) times daily., Disp: , Rfl:  .  carvedilol (COREG) 3.125 MG tablet, Take 1 tablet (3.125 mg total) by mouth 2 (two) times daily with a meal., Disp: 180 tablet, Rfl: 3 .  furosemide (LASIX) 40 MG tablet, TAKE 1/2 TABLET BY MOUTH EVERY DAY AS NEEDED., Disp: 90 tablet, Rfl: 1 .  ipratropium (ATROVENT) 0.06 % nasal spray, SPRAY 2 SPRAYS INTO EACH NOSTRIL EVERY DAY, Disp: 15 mL, Rfl: 5 .  latanoprost (XALATAN) 0.005 % ophthalmic solution, Place 1 drop into both eyes at bedtime., Disp: , Rfl:  .  simvastatin (ZOCOR) 20 MG tablet, Take 1 tablet (20 mg total) by mouth daily., Disp: 90 tablet, Rfl: 1 .  warfarin (COUMADIN) 5 MG tablet, Take as directed by anticoagulation clinic., Disp: 90 tablet, Rfl: 2   Observations/Objective: Blood pressure 120/82, pulse 78, temperature (!) 96.6 F (35.9 C), temperature source Temporal, height 5\' 9"  (1.753 m), weight 181 lb (82.1 kg), SpO2 99 %.  Physical Exam Constitutional:      Appearance: He is well-developed.  HENT:     Head: Normocephalic.     Right Ear: Hearing normal.     Left Ear: Hearing normal.     Nose: Nose normal.  Neck:     Thyroid: No thyroid mass or thyromegaly.     Vascular: No carotid bruit.     Trachea: Trachea normal.  Cardiovascular:     Rate and Rhythm: Normal rate and regular rhythm.     Pulses: Normal pulses.     Heart sounds: Heart sounds not distant. No murmur heard.  No friction rub. No gallop.      Comments: No peripheral edema Pulmonary:     Effort: Pulmonary effort is normal. No respiratory distress.     Breath sounds: Normal breath sounds.    Skin:    General: Skin is warm and dry.     Findings: No rash.     Comments:  Small amount of yellowish discharge expressed from superior scar. See picture.  Psychiatric:        Speech: Speech normal.        Behavior: Behavior normal.        Thought Content: Thought content normal.        Assessment and Plan   Type 2 diabetes mellitus with complication, without long-term current use of insulin ([place]) Well controlled.  Cellulitis of left lower extremity Minimal cellulitis. Small amount pus discharge expressed at superior wound scar.  Send for culture but  Treat with keflex x 7 days.      Eliezer Lofts, MD

## 2020-04-15 LAB — WOUND CULTURE
MICRO NUMBER:: 10742788
SPECIMEN QUALITY:: ADEQUATE

## 2020-04-27 ENCOUNTER — Other Ambulatory Visit: Payer: Self-pay | Admitting: Family Medicine

## 2020-05-07 ENCOUNTER — Other Ambulatory Visit: Payer: Self-pay

## 2020-05-07 ENCOUNTER — Emergency Department: Payer: Medicare Other

## 2020-05-07 ENCOUNTER — Encounter: Payer: Self-pay | Admitting: Emergency Medicine

## 2020-05-07 ENCOUNTER — Emergency Department
Admission: EM | Admit: 2020-05-07 | Discharge: 2020-05-08 | Disposition: A | Discharge status: Other Acute Inpatient Hospital | Payer: Medicare Other | Attending: Emergency Medicine | Admitting: Emergency Medicine

## 2020-05-07 DIAGNOSIS — R569 Unspecified convulsions: Secondary | ICD-10-CM | POA: Diagnosis not present

## 2020-05-07 DIAGNOSIS — E119 Type 2 diabetes mellitus without complications: Secondary | ICD-10-CM | POA: Insufficient documentation

## 2020-05-07 DIAGNOSIS — S8291XA Unspecified fracture of right lower leg, initial encounter for closed fracture: Secondary | ICD-10-CM | POA: Diagnosis not present

## 2020-05-07 DIAGNOSIS — Z20822 Contact with and (suspected) exposure to covid-19: Secondary | ICD-10-CM | POA: Diagnosis not present

## 2020-05-07 DIAGNOSIS — S2241XA Multiple fractures of ribs, right side, initial encounter for closed fracture: Secondary | ICD-10-CM | POA: Diagnosis not present

## 2020-05-07 DIAGNOSIS — W1839XA Other fall on same level, initial encounter: Secondary | ICD-10-CM | POA: Diagnosis not present

## 2020-05-07 DIAGNOSIS — R000 Tachycardia, unspecified: Secondary | ICD-10-CM | POA: Diagnosis not present

## 2020-05-07 DIAGNOSIS — Y999 Unspecified external cause status: Secondary | ICD-10-CM | POA: Diagnosis not present

## 2020-05-07 DIAGNOSIS — I2510 Atherosclerotic heart disease of native coronary artery without angina pectoris: Secondary | ICD-10-CM | POA: Insufficient documentation

## 2020-05-07 DIAGNOSIS — Y929 Unspecified place or not applicable: Secondary | ICD-10-CM | POA: Diagnosis not present

## 2020-05-07 DIAGNOSIS — S066X0A Traumatic subarachnoid hemorrhage without loss of consciousness, initial encounter: Secondary | ICD-10-CM | POA: Insufficient documentation

## 2020-05-07 DIAGNOSIS — I5022 Chronic systolic (congestive) heart failure: Secondary | ICD-10-CM | POA: Diagnosis not present

## 2020-05-07 DIAGNOSIS — I4510 Unspecified right bundle-branch block: Secondary | ICD-10-CM | POA: Diagnosis not present

## 2020-05-07 DIAGNOSIS — Z79899 Other long term (current) drug therapy: Secondary | ICD-10-CM | POA: Insufficient documentation

## 2020-05-07 DIAGNOSIS — Y939 Activity, unspecified: Secondary | ICD-10-CM | POA: Insufficient documentation

## 2020-05-07 DIAGNOSIS — S299XXA Unspecified injury of thorax, initial encounter: Secondary | ICD-10-CM | POA: Diagnosis present

## 2020-05-07 DIAGNOSIS — S79912A Unspecified injury of left hip, initial encounter: Secondary | ICD-10-CM | POA: Diagnosis not present

## 2020-05-07 DIAGNOSIS — Z7901 Long term (current) use of anticoagulants: Secondary | ICD-10-CM | POA: Diagnosis not present

## 2020-05-07 DIAGNOSIS — I499 Cardiac arrhythmia, unspecified: Secondary | ICD-10-CM | POA: Diagnosis not present

## 2020-05-07 DIAGNOSIS — I609 Nontraumatic subarachnoid hemorrhage, unspecified: Secondary | ICD-10-CM

## 2020-05-07 DIAGNOSIS — S199XXA Unspecified injury of neck, initial encounter: Secondary | ICD-10-CM | POA: Diagnosis not present

## 2020-05-07 DIAGNOSIS — I110 Hypertensive heart disease with heart failure: Secondary | ICD-10-CM | POA: Diagnosis not present

## 2020-05-07 DIAGNOSIS — Z743 Need for continuous supervision: Secondary | ICD-10-CM | POA: Diagnosis not present

## 2020-05-07 DIAGNOSIS — I4891 Unspecified atrial fibrillation: Secondary | ICD-10-CM | POA: Diagnosis not present

## 2020-05-07 NOTE — ED Provider Notes (Signed)
[facility] Emergency Department Provider Note  ____________________________________________   First MD Initiated Contact with Patient 05/07/20 2328     (approximate)  I have reviewed the triage vital signs and the nursing notes.   HISTORY  Chief Complaint Fall, Seizures, and Altered Mental Status   HPI Benjamin Soto is a 84 y.o. male with below list of previous medical conditions including alcohol abuse, CAD, congestive heart failure previous gastrointestinal bleeding presents to the emergency department via EMS with history of fall yesterday without any specifics noted about the fall.  Patient states that tonight the patient had a approximately 2 to 3-minute episode in front of the family of seizure-like activity while seated in a chair.Marland Kitchen  EMS states on their arrival the patient was markedly combative requiring chemical sedation with Versed patient was given 2.5 mg.        Past Medical History:  Diagnosis Date  . Alcohol abuse   . Anticoagulated on Coumadin   . CAD (coronary artery disease)    a. 06/2013 Cath: LM nl, LAD nl, LCX 35m, RCA 40p, EF 25-30%.  . Diabetes mellitus, type 2 ([place]) 09/21/92  . Elbow fracture, right 2014   "healed by itself"  . Gout 04/22/2003   per Dr Jefm Bryant  . HFrEF (heart failure with reduced ejection fraction) ([place])    a.  02/2014 Echo: EF 20-25%, diff HK; b. 04/2014 CPX test: primary cirulatory limitation 2/2 advanced CHF.  Marland Kitchen History of colon polyps 10/26/02   colonoscopy  . History of GI bleed    a. 08/2013 [facility] by [facility]- felt to be diverticular in origin. Resolved off Coumadin. Cleared to resume per GI.  Marland Kitchen History of tobacco abuse   . Hyperlipidemia 05/22/01  . Hypertension   . Hypogonadism male   . Knee pain, right    [facility], Dr Jefm Bryant with rheumatology  . Mitral regurgitation    a. 02/2014 Echo: mild to mod MR.  . Nonischemic cardiomyopathy ([place])    a. 06/2013 Cath: nonobs dzs, EF 20-25; b.  02/2014 Echo: EF 20-25%, diff HK, mild AI, mild to mod MR, mod dil LA, mildly reduced RV fxn, mildly dil RA, PASP wnl.  . Permanent atrial fibrillation ([place]) 01/20/2003   a. CHA2DS2VASc = 7-->coumadin.  Marland Kitchen TIA (transient ischemic attack)    a. 07/2013 Admitted w/ transient AMS->No stroke;  b. 08/2013 Carotid U/S: <50% bilat ICA stenosis.    Patient Active Problem List   Diagnosis Date Noted  . Cellulitis of left lower extremity 04/12/2020  . Type 2 diabetes mellitus with complication, without long-term current use of insulin ([place]) 08/31/2018  . Bruising 06/17/2018  . HTN (hypertension) 02/02/2018  . Fall at home 02/02/2018  . Chronic atrial fibrillation 07/27/2017  . Skin tear of elbow without complication 84/13/2440  . Leg wound, left, sequela 02/06/2017  . Alcohol abuse 10/14/2016  . Advance care planning 09/28/2014  . Thrombocytopenia ([place]) 09/13/2013  . GI bleed 09/12/2013  . Transient amnesia 08/13/2013  . Congestive dilated cardiomyopathy ([place]) 06/30/2013  . CAD (coronary artery disease) 06/09/2013  . Orthostatic hypotension 05/31/2013  . Medicare annual wellness visit, subsequent 02/06/2013  . Systolic CHF, chronic ([place]) 10/22/2011  . Wheeze 10/07/2011  . Hematuria, microscopic 08/17/2011  . Erectile dysfunction 02/09/2011  . RENAL INSUFFICIENCY 02/18/2010  . ROSACEA 02/18/2010  . FOOT PAIN, BILATERAL 02/28/2009  . DRY EYE SYNDROME 01/09/2008  . Paroxysmal SVT (supraventricular tachycardia) ([place]) 01/11/2007  . Gout 04/22/2003  . Atrial  fibrillation, chronic ([place]) 01/20/2003  . Hyperlipidemia 05/22/2001  . History of diabetes mellitus 09/21/1992    Past Surgical History:  Procedure Laterality Date  . APPENDECTOMY    . BUNIONECTOMY Right 02/2004   R MTP  . [place]   normal, [facility]  . CARDIAC CATHETERIZATION  10/14   [facility]- 50% mid LCx, 40% prox RCA  . CYST EXCISION  08/07/2013   head  . TONSILLECTOMY      Prior to Admission  medications   Medication Sig Start Date End Date Taking? Authorizing Provider  allopurinol (ZYLOPRIM) 100 MG tablet Take 1 tablet (100 mg total) by mouth daily. 12/26/19   Tonia Ghent, MD  allopurinol (ZYLOPRIM) 300 MG tablet Take 1 tablet (300 mg total) by mouth daily. 10/04/19   Tonia Ghent, MD  Ascorbic Acid (VITAMIN C) 1000 MG tablet Take 1,000 mg by mouth daily.    [provider]  brimonidine (ALPHAGAN) 0.2 % ophthalmic solution Place into both eyes 2 (two) times daily.    [provider]  carvedilol (COREG) 3.125 MG tablet Take 1 tablet (3.125 mg total) by mouth 2 (two) times daily with a meal. 10/04/19   Tonia Ghent, MD  cephALEXin (KEFLEX) 500 MG capsule Take 1 capsule (500 mg total) by mouth 3 (three) times daily. 04/12/20   Bedsole, Amy E, MD  furosemide (LASIX) 40 MG tablet TAKE 1/2 TABLET BY MOUTH EVERY DAY AS NEEDED. 10/04/19   Tonia Ghent, MD  ipratropium (ATROVENT) 0.06 % nasal spray SPRAY 2 SPRAYS INTO EACH NOSTRIL EVERY DAY 10/04/19   Tonia Ghent, MD  latanoprost (XALATAN) 0.005 % ophthalmic solution Place 1 drop into both eyes at bedtime.    [provider]  simvastatin (ZOCOR) 20 MG tablet TAKE 1 TABLET(20 MG) BY MOUTH DAILY 04/29/20   Tonia Ghent, MD  warfarin (COUMADIN) 5 MG tablet Take as directed by anticoagulation clinic. 10/04/19   Tonia Ghent, MD    Allergies Lisinopril, Tramadol, and Varenicline tartrate  Family History  Problem Relation Age of Onset  . Diabetes Father   . Other Mother        Deceased  . Healthy Son   . Colon cancer Daughter        Cancer free  . Prostate cancer Neg Hx     Social History Social History   Tobacco Use  . Smoking status: Former Smoker    Packs/day: 0.25    Years: 44.00    Pack years: 11.00    Types: Cigarettes, Pipe  . Smokeless tobacco: Never Used  Vaping Use  . Vaping Use: Never used  Substance Use Topics  . Alcohol use: Yes    Alcohol/week: 4.0 standard drinks      Types: 4 Glasses of wine per week  . Drug use: No    Review of Systems Constitutional: No fever/chills Eyes: No visual changes. ENT: No sore throat. Cardiovascular: Denies chest pain. Respiratory: Denies shortness of breath. Gastrointestinal: No abdominal pain.  No nausea, no vomiting.  No diarrhea.  No constipation. Genitourinary: Negative for dysuria. Musculoskeletal: Negative for neck pain.  Negative for back pain. Integumentary: Negative for rash. Neurological: Negative for headaches, focal weakness or numbness. Psychiatric:   ____________________________________________   PHYSICAL EXAM:  VITAL SIGNS: ED Triage Vitals [05/07/20 2327]  Enc Vitals Group     BP (!) 150/97     Pulse Rate 95     Resp 20  Temp      Temp src      SpO2 98 %     Weight      Height      Head Circumference      Peak Flow      Pain Score      Pain Loc      Pain Edu?      Excl. in [place]?     Constitutional: Alert and oriented.  Eyes: Conjunctivae are normal.  Head: Atraumatic. Mouth/Throat: Patient is wearing a mask. Neck: No stridor.  No meningeal signs. Chest: Pain to palpation of the right chest wall diffusely Cardiovascular: Normal rate, regular rhythm. Good peripheral circulation. Grossly normal heart sounds. Respiratory: Normal respiratory effort.  No retractions. Gastrointestinal: Soft and nontender. No distention.  Musculoskeletal: No lower extremity tenderness nor edema. No gross deformities of extremities. Neurologic:  Normal speech and language. No gross focal neurologic deficits are appreciated.  Skin:  Skin is warm, dry and intact. Psychiatric: Mood and affect are normal. Speech and behavior are normal.  ____________________________________________   LABS (all labs ordered are listed, but only abnormal results are displayed)  Labs Reviewed  CBC WITH DIFFERENTIAL/PLATELET - Abnormal; Notable for the following components:      Result Value   RBC 4.09 (*)    All  other components within normal limits  COMPREHENSIVE METABOLIC PANEL - Abnormal; Notable for the following components:   Sodium 132 (*)    Chloride 92 (*)    Glucose, Bld 141 (*)    BUN 29 (*)    Creatinine, Ser 1.48 (*)    GFR calc non Af Amer 43 (*)    GFR calc Af Amer 50 (*)    All other components within normal limits  PROTIME-INR - Abnormal; Notable for the following components:   Prothrombin Time 17.7 (*)    INR 1.5 (*)    All other components within normal limits  CK  ETHANOL  URINALYSIS, COMPLETE (UACMP) WITH MICROSCOPIC  CBG MONITORING, ED  TROPONIN I (HIGH SENSITIVITY)  TROPONIN I (HIGH SENSITIVITY)   ____________________________________________  EKG  ED ECG REPORT I, [place] Julicia Krieger, the attending physician, personally viewed and interpreted this ECG.   Date: 05/07/2020  EKG Time: 11:25 PM  Rate: 90  Rhythm: Normal sinus rhythm  Axis: Normal   Intervals: Normal  ST&T Change: None  ____________________________________________  RADIOLOGY I, [place] Kambria Grima, personally viewed and evaluated these images (plain radiographs) as part of my medical decision making, as well as reviewing the written report by the radiologist.  ED MD interpretation: CT head revealed small amount of subarachnoid hemorrhage over the anterior left convexity likely traumatic per radiologist. CT cervical spine revealed no acute fracture or subluxation per radiologist.  Chest x-ray revealed acute fifth sixth seventh and eighth right rib fractures.    Official radiology report(s): CT HEAD WO CONTRAST  Result Date: 05/08/2020 CLINICAL DATA:  Seizure.  Fall while on anti coagulation. EXAM: CT HEAD WITHOUT CONTRAST CT CERVICAL SPINE WITHOUT CONTRAST TECHNIQUE: Multidetector CT imaging of the head and cervical spine was performed following the standard protocol without intravenous contrast. Multiplanar CT image reconstructions of the cervical spine were also generated. COMPARISON:  None.  FINDINGS: CT HEAD FINDINGS Brain: There is a small amount of subarachnoid hemorrhage over the anterior left convexity. The size and configuration of the ventricles and extra-axial CSF spaces are normal. There is hypoattenuation of the periventricular white matter, most commonly indicating chronic ischemic microangiopathy. Vascular: No abnormal  hyperdensity of the major intracranial arteries or dural venous sinuses. No intracranial atherosclerosis. Skull: The visualized skull base, calvarium and extracranial soft tissues are normal. Sinuses/Orbits: No fluid Soto or advanced mucosal thickening of the visualized paranasal sinuses. No mastoid or middle ear effusion. The orbits are normal. CT CERVICAL SPINE FINDINGS Alignment: Grade 1 C5-6 anterolisthesis. Skull base and vertebrae: No acute fracture. Soft tissues and spinal canal: No prevertebral fluid or swelling. No visible canal hematoma. Disc Soto: No advanced spinal canal or neural foraminal stenosis. Upper chest: No pneumothorax, pulmonary nodule or pleural effusion. Other: Normal visualized paraspinal cervical soft tissues. IMPRESSION: 1. Small amount of subarachnoid hemorrhage over the anterior left convexity, likely traumatic. 2. No acute fracture or static subluxation of the cervical spine. 3. Critical Value/emergent results were called by telephone at the time of interpretation on 05/08/2020 at 12:15 am to provider [facility] , who verbally acknowledged these results. Electronically Signed   By: Ulyses Jarred M.D.   On: 05/08/2020 00:16   CT CERVICAL SPINE WO CONTRAST  Result Date: 05/08/2020 CLINICAL DATA:  Seizure.  Fall while on anti coagulation. EXAM: CT HEAD WITHOUT CONTRAST CT CERVICAL SPINE WITHOUT CONTRAST TECHNIQUE: Multidetector CT imaging of the head and cervical spine was performed following the standard protocol without intravenous contrast. Multiplanar CT image reconstructions of the cervical spine were also generated. COMPARISON:   None. FINDINGS: CT HEAD FINDINGS Brain: There is a small amount of subarachnoid hemorrhage over the anterior left convexity. The size and configuration of the ventricles and extra-axial CSF spaces are normal. There is hypoattenuation of the periventricular white matter, most commonly indicating chronic ischemic microangiopathy. Vascular: No abnormal hyperdensity of the major intracranial arteries or dural venous sinuses. No intracranial atherosclerosis. Skull: The visualized skull base, calvarium and extracranial soft tissues are normal. Sinuses/Orbits: No fluid Soto or advanced mucosal thickening of the visualized paranasal sinuses. No mastoid or middle ear effusion. The orbits are normal. CT CERVICAL SPINE FINDINGS Alignment: Grade 1 C5-6 anterolisthesis. Skull base and vertebrae: No acute fracture. Soft tissues and spinal canal: No prevertebral fluid or swelling. No visible canal hematoma. Disc Soto: No advanced spinal canal or neural foraminal stenosis. Upper chest: No pneumothorax, pulmonary nodule or pleural effusion. Other: Normal visualized paraspinal cervical soft tissues. IMPRESSION: 1. Small amount of subarachnoid hemorrhage over the anterior left convexity, likely traumatic. 2. No acute fracture or static subluxation of the cervical spine. 3. Critical Value/emergent results were called by telephone at the time of interpretation on 05/08/2020 at 12:15 am to provider [facility] , who verbally acknowledged these results. Electronically Signed   By: Ulyses Jarred M.D.   On: 05/08/2020 00:16   DG Chest Portable 1 View  Result Date: 05/07/2020 CLINICAL DATA:  Status post seizure and subsequent fall. EXAM: PORTABLE CHEST 1 VIEW COMPARISON:  August 09, 2013 FINDINGS: There is no evidence of acute infiltrate, pleural effusion or pneumothorax. The heart size and mediastinal contours are within normal limits. There is marked severity calcification of the aortic arch. Acute fifth, sixth, seventh and  eighth right-sided rib fractures are seen. IMPRESSION: Acute fifth, sixth, seventh and eighth right-sided rib fractures. Electronically Signed   By: Virgina Norfolk M.D.   On: 05/07/2020 23:54   DG Hip Unilat W or Wo Pelvis 2-3 Views Left  Result Date: 05/07/2020 CLINICAL DATA:  Status post fall. EXAM: DG HIP (WITH OR WITHOUT PELVIS) 2-3V LEFT COMPARISON:  None. FINDINGS: There is no evidence of an acute hip fracture or dislocation. Mild to  moderate severity degenerative changes seen involving both hips. Moderate severity vascular calcification is noted. IMPRESSION: No acute osseous abnormality. Electronically Signed   By: Virgina Norfolk M.D.   On: 05/07/2020 23:53    ____________________________________________   PROCEDURES     .Critical Care Performed by: Gregor Hams, MD Authorized by: Gregor Hams, MD   Critical care provider statement:    Critical care time (minutes):  30   Critical care time was exclusive of:  Separately billable procedures and treating other patients (Intracranial hemorrhage)   Critical care was necessary to treat or prevent imminent or life-threatening deterioration of the following conditions:  CNS failure or compromise   Critical care was time spent personally by me on the following activities:  Development of treatment plan with patient or surrogate, discussions with consultants, evaluation of patient's response to treatment, examination of patient, obtaining history from patient or surrogate, ordering and performing treatments and interventions, ordering and review of laboratory studies, ordering and review of radiographic studies, pulse oximetry, re-evaluation of patient's condition and review of old charts     ____________________________________________   [place] / MDM / [place] / ED COURSE  As part of my medical decision making, I reviewed the following data within the electronic MEDICAL RECORD NUMBER   84 year old  male presented with above-stated history and physical exam differential diagnosis including but not limited to traumatic intracranial hemorrhage with resultant seizure-like activity, rib fractures.  CT scan of the head did reveal a small left frontal subarachnoid hemorrhage.  Chest x-ray findings consistent with acute right-sided rib fractures of the fifth through eighth rib.  Patient discussed with Dr. Grandville Silos trauma surgeon at [facility] who accepted patient in transfer.  After consultation with Dr. Grandville Silos patient was given vitamin K 10 mg IV.  Given the fact that the patient's INR is not exceeding 2 [place] not administered.  ____________________________________________  FINAL CLINICAL IMPRESSION(S) / ED DIAGNOSES  Final diagnoses:  Multiple closed fractures of right lower extremity and ribs, initial encounter  Subarachnoid hemorrhage ([place])     MEDICATIONS GIVEN DURING THIS VISIT:  Medications  phytonadione (VITAMIN K) 10 mg in dextrose 5 % 50 mL IVPB (10 mg Intravenous New Bag/Given 05/08/20 0137)  levETIRAcetam (KEPPRA) IVPB 1000 mg/100 mL premix ( Intravenous Stopped 05/08/20 0102)     ED Discharge Orders    None      *Please note:  Kc Summerson was evaluated in Emergency Department on 05/08/2020 for the symptoms described in the history of present illness. He was evaluated in the context of the global COVID-19 pandemic, which necessitated consideration that the patient might be at risk for infection with the SARS-CoV-2 virus that causes COVID-19. Institutional protocols and algorithms that pertain to the evaluation of patients at risk for COVID-19 are in a state of rapid change based on information released by regulatory bodies including the CDC and federal and state organizations. These policies and algorithms were followed during the patient's care in the ED.  Some ED evaluations and interventions may be delayed as a result of limited staffing during and after the pandemic.*  Note:   This document was prepared using Dragon voice recognition software and may include unintentional dictation errors.   Gregor Hams, MD 05/08/20 [phone]

## 2020-05-07 NOTE — ED Triage Notes (Signed)
Pt to ED via [facility] from home c/o possible seizure today witnessed by family that arms went up and eyes back in head guided to ground from chair.  Pt had fall yesterday, on warfarin, on scene waking up pt fighting and agitated given 2.5mg  versed by EMS.  Pt left leg shortened and rotated, pain to left hip when palpated by Dr. Owens Shark.  EMS CBG 158, a.fib.  Pt wheezing upon arrival.

## 2020-05-08 ENCOUNTER — Telehealth: Payer: Self-pay | Admitting: Family Medicine

## 2020-05-08 ENCOUNTER — Observation Stay (HOSPITAL_COMMUNITY)
Admission: AD | Admit: 2020-05-08 | Discharge: 2020-05-08 | Disposition: A | Discharge status: Home / Self Care | Payer: Medicare Other | Source: Other Acute Inpatient Hospital | Attending: Surgery | Admitting: Surgery

## 2020-05-08 DIAGNOSIS — I4821 Permanent atrial fibrillation: Secondary | ICD-10-CM | POA: Diagnosis not present

## 2020-05-08 DIAGNOSIS — Y92009 Unspecified place in unspecified non-institutional (private) residence as the place of occurrence of the external cause: Secondary | ICD-10-CM | POA: Insufficient documentation

## 2020-05-08 DIAGNOSIS — Y939 Activity, unspecified: Secondary | ICD-10-CM | POA: Diagnosis not present

## 2020-05-08 DIAGNOSIS — I110 Hypertensive heart disease with heart failure: Secondary | ICD-10-CM | POA: Insufficient documentation

## 2020-05-08 DIAGNOSIS — E119 Type 2 diabetes mellitus without complications: Secondary | ICD-10-CM | POA: Insufficient documentation

## 2020-05-08 DIAGNOSIS — Z79899 Other long term (current) drug therapy: Secondary | ICD-10-CM | POA: Diagnosis not present

## 2020-05-08 DIAGNOSIS — S066X9A Traumatic subarachnoid hemorrhage with loss of consciousness of unspecified duration, initial encounter: Secondary | ICD-10-CM | POA: Diagnosis not present

## 2020-05-08 DIAGNOSIS — S069XAA Unspecified intracranial injury with loss of consciousness status unknown, initial encounter: Secondary | ICD-10-CM | POA: Diagnosis present

## 2020-05-08 DIAGNOSIS — S069X9A Unspecified intracranial injury with loss of consciousness of unspecified duration, initial encounter: Secondary | ICD-10-CM | POA: Diagnosis present

## 2020-05-08 DIAGNOSIS — M109 Gout, unspecified: Secondary | ICD-10-CM | POA: Diagnosis not present

## 2020-05-08 DIAGNOSIS — S06899A Other specified intracranial injury with loss of consciousness of unspecified duration, initial encounter: Secondary | ICD-10-CM | POA: Diagnosis not present

## 2020-05-08 DIAGNOSIS — I5022 Chronic systolic (congestive) heart failure: Secondary | ICD-10-CM | POA: Diagnosis not present

## 2020-05-08 DIAGNOSIS — I2510 Atherosclerotic heart disease of native coronary artery without angina pectoris: Secondary | ICD-10-CM | POA: Insufficient documentation

## 2020-05-08 DIAGNOSIS — S066X0A Traumatic subarachnoid hemorrhage without loss of consciousness, initial encounter: Secondary | ICD-10-CM | POA: Diagnosis not present

## 2020-05-08 DIAGNOSIS — Y999 Unspecified external cause status: Secondary | ICD-10-CM | POA: Insufficient documentation

## 2020-05-08 DIAGNOSIS — S2231XA Fracture of one rib, right side, initial encounter for closed fracture: Secondary | ICD-10-CM

## 2020-05-08 DIAGNOSIS — Z20822 Contact with and (suspected) exposure to covid-19: Secondary | ICD-10-CM | POA: Diagnosis not present

## 2020-05-08 DIAGNOSIS — Z8673 Personal history of transient ischemic attack (TIA), and cerebral infarction without residual deficits: Secondary | ICD-10-CM | POA: Insufficient documentation

## 2020-05-08 DIAGNOSIS — S2241XA Multiple fractures of ribs, right side, initial encounter for closed fracture: Secondary | ICD-10-CM | POA: Insufficient documentation

## 2020-05-08 DIAGNOSIS — W19XXXA Unspecified fall, initial encounter: Secondary | ICD-10-CM | POA: Insufficient documentation

## 2020-05-08 DIAGNOSIS — Z7901 Long term (current) use of anticoagulants: Secondary | ICD-10-CM | POA: Diagnosis not present

## 2020-05-08 DIAGNOSIS — R2689 Other abnormalities of gait and mobility: Secondary | ICD-10-CM | POA: Diagnosis not present

## 2020-05-08 LAB — URINALYSIS, COMPLETE (UACMP) WITH MICROSCOPIC
Bilirubin Urine: NEGATIVE
Glucose, UA: NEGATIVE mg/dL
Hgb urine dipstick: NEGATIVE
Ketones, ur: NEGATIVE mg/dL
Leukocytes,Ua: NEGATIVE
Nitrite: NEGATIVE
Protein, ur: NEGATIVE mg/dL
Specific Gravity, Urine: 1.01 (ref 1.005–1.030)
pH: 5 (ref 5.0–8.0)

## 2020-05-08 LAB — COMPREHENSIVE METABOLIC PANEL
ALT: 26 U/L (ref 0–44)
AST: 27 U/L (ref 15–41)
Albumin: 4.1 g/dL (ref 3.5–5.0)
Alkaline Phosphatase: 54 U/L (ref 38–126)
Anion gap: 9 (ref 5–15)
BUN: 29 mg/dL — ABNORMAL HIGH (ref 8–23)
CO2: 31 mmol/L (ref 22–32)
Calcium: 9.1 mg/dL (ref 8.9–10.3)
Chloride: 92 mmol/L — ABNORMAL LOW (ref 98–111)
Creatinine, Ser: 1.48 mg/dL — ABNORMAL HIGH (ref 0.61–1.24)
GFR calc Af Amer: 50 mL/min — ABNORMAL LOW (ref >60)
GFR calc non Af Amer: 43 mL/min — ABNORMAL LOW (ref >60)
Glucose, Bld: 141 mg/dL — ABNORMAL HIGH (ref 70–99)
Potassium: 4.2 mmol/L (ref 3.5–5.1)
Sodium: 132 mmol/L — ABNORMAL LOW (ref 135–145)
Total Bilirubin: 1.2 mg/dL (ref 0.3–1.2)
Total Protein: 6.6 g/dL (ref 6.5–8.1)

## 2020-05-08 LAB — CBC WITH DIFFERENTIAL/PLATELET
Abs Immature Granulocytes: 0.03 10*3/uL (ref 0.00–0.07)
Basophils Absolute: 0 10*3/uL (ref 0.0–0.1)
Basophils Relative: 0 %
Eosinophils Absolute: 0.1 10*3/uL (ref 0.0–0.5)
Eosinophils Relative: 1 %
HCT: 39.1 % (ref 39.0–52.0)
Hemoglobin: 13.2 g/dL (ref 13.0–17.0)
Immature Granulocytes: 0 %
Lymphocytes Relative: 12 %
Lymphs Abs: 0.9 10*3/uL (ref 0.7–4.0)
MCH: 32.3 pg (ref 26.0–34.0)
MCHC: 33.8 g/dL (ref 30.0–36.0)
MCV: 95.6 fL (ref 80.0–100.0)
Monocytes Absolute: 0.7 10*3/uL (ref 0.1–1.0)
Monocytes Relative: 9 %
Neutro Abs: 5.6 10*3/uL (ref 1.7–7.7)
Neutrophils Relative %: 78 %
Platelets: 154 10*3/uL (ref 150–400)
RBC: 4.09 MIL/uL — ABNORMAL LOW (ref 4.22–5.81)
RDW: 14.9 % (ref 11.5–15.5)
WBC: 7.3 10*3/uL (ref 4.0–10.5)
nRBC: 0 % (ref 0.0–0.2)

## 2020-05-08 LAB — TROPONIN I (HIGH SENSITIVITY)
Troponin I (High Sensitivity): 10 ng/L (ref <18)
Troponin I (High Sensitivity): 10 ng/L (ref <18)

## 2020-05-08 LAB — PROTIME-INR
INR: 1.5 — ABNORMAL HIGH (ref 0.8–1.2)
Prothrombin Time: 17.7 seconds — ABNORMAL HIGH (ref 11.4–15.2)

## 2020-05-08 LAB — MRSA PCR SCREENING: MRSA by PCR: NEGATIVE

## 2020-05-08 LAB — ETHANOL: Alcohol, Ethyl (B): <10 mg/dL (ref <10)

## 2020-05-08 LAB — SARS CORONAVIRUS 2 BY RT PCR (HOSPITAL ORDER, PERFORMED IN ~~LOC~~ HOSPITAL LAB): SARS Coronavirus 2: NEGATIVE

## 2020-05-08 LAB — CK: Total CK: 142 U/L (ref 49–397)

## 2020-05-08 MED ORDER — LEVETIRACETAM IN NACL 1000 MG/100ML IV SOLN
1000.0000 mg | Freq: Once | INTRAVENOUS | Status: AC
  Administered 2020-05-08: 1000 mg via INTRAVENOUS
  Filled 2020-05-08: qty 100

## 2020-05-08 MED ORDER — ACETAMINOPHEN 325 MG PO TABS: 650.0000 mg | ORAL_TABLET | ORAL | Status: DC | PRN

## 2020-05-08 MED ORDER — VITAMIN K1 10 MG/ML IJ SOLN
10.0000 mg | Freq: Once | INTRAVENOUS | Status: AC
  Administered 2020-05-08: 10 mg via INTRAVENOUS
  Filled 2020-05-08: qty 1

## 2020-05-08 MED ORDER — BRIMONIDINE TARTRATE 0.2 % OP SOLN
1.0000 [drp] | Freq: Two times a day (BID) | OPHTHALMIC | Status: DC
  Administered 2020-05-08: 1 [drp] via OPHTHALMIC
  Filled 2020-05-08: qty 5

## 2020-05-08 MED ORDER — ONDANSETRON HCL 4 MG/2ML IJ SOLN: 4.0000 mg | Freq: Four times a day (QID) | INTRAMUSCULAR | Status: DC | PRN

## 2020-05-08 MED ORDER — OXYCODONE HCL 5 MG PO TABS: 5.0000 mg | ORAL_TABLET | ORAL | Status: DC | PRN

## 2020-05-08 MED ORDER — IPRATROPIUM BROMIDE 0.06 % NA SOLN
2.0000 | Freq: Every day | NASAL | Status: DC
  Administered 2020-05-08: 2 via NASAL
  Filled 2020-05-08: qty 15

## 2020-05-08 MED ORDER — ORAL CARE MOUTH RINSE
15.0000 mL | Freq: Two times a day (BID) | OROMUCOSAL | Status: DC
  Administered 2020-05-08: 15 mL via OROMUCOSAL

## 2020-05-08 MED ORDER — CHLORHEXIDINE GLUCONATE CLOTH 2 % EX PADS: 6.0000 | MEDICATED_PAD | Freq: Every day | CUTANEOUS | Status: DC

## 2020-05-08 MED ORDER — LATANOPROST 0.005 % OP SOLN
1.0000 [drp] | Freq: Every day | OPHTHALMIC | Status: DC
  Filled 2020-05-08: qty 2.5

## 2020-05-08 MED ORDER — LEVETIRACETAM 500 MG PO TABS
500.0000 mg | ORAL_TABLET | Freq: Two times a day (BID) | ORAL | Status: DC
  Administered 2020-05-08: 500 mg via ORAL
  Filled 2020-05-08: qty 1

## 2020-05-08 MED ORDER — MORPHINE SULFATE (PF) 4 MG/ML IV SOLN: 4.0000 mg | INTRAVENOUS | Status: DC | PRN

## 2020-05-08 MED ORDER — CARVEDILOL 3.125 MG PO TABS
3.1250 mg | ORAL_TABLET | Freq: Two times a day (BID) | ORAL | Status: DC
  Administered 2020-05-08: 3.125 mg via ORAL
  Filled 2020-05-08: qty 1

## 2020-05-08 MED ORDER — LEVETIRACETAM 500 MG PO TABS: 500.0000 mg | ORAL_TABLET | Freq: Two times a day (BID) | ORAL | 0 refills | Status: DC

## 2020-05-08 MED ORDER — POTASSIUM CHLORIDE IN NACL 20-0.9 MEQ/L-% IV SOLN
INTRAVENOUS | Status: DC
  Filled 2020-05-08: qty 1000

## 2020-05-08 MED ORDER — ALLOPURINOL 100 MG PO TABS
100.0000 mg | ORAL_TABLET | Freq: Every day | ORAL | Status: DC
  Administered 2020-05-08: 100 mg via ORAL
  Filled 2020-05-08: qty 1

## 2020-05-08 MED ORDER — ONDANSETRON 4 MG PO TBDP: 4.0000 mg | ORAL_TABLET | Freq: Four times a day (QID) | ORAL | Status: DC | PRN

## 2020-05-08 MED ORDER — FUROSEMIDE 20 MG PO TABS
20.0000 mg | ORAL_TABLET | Freq: Every day | ORAL | Status: DC
  Administered 2020-05-08: 20 mg via ORAL
  Filled 2020-05-08: qty 1

## 2020-05-08 NOTE — Progress Notes (Signed)
Pt discharged to home.  All questions answered.  Pt told to stop coumadin and to start taking keppra.  RN also told pt and pt's wife to call Dr. Rubbie Battiest office to set up appt.  2 IV's removed.

## 2020-05-08 NOTE — Evaluation (Signed)
Speech Language Pathology Evaluation Patient Details Name: Benjamin Soto MRN: [id number] DOB: 1936/03/31 Today's Date: 05/08/2020 Time: 7622-6333 SLP Time Calculation (min) (ACUTE ONLY): 17 min  Problem List:  Patient Active Problem List   Diagnosis Date Noted  . TBI (traumatic brain injury) ([place]) 05/08/2020  . Cellulitis of left lower extremity 04/12/2020  . Type 2 diabetes mellitus with complication, without long-term current use of insulin ([place]) 08/31/2018  . Bruising 06/17/2018  . HTN (hypertension) 02/02/2018  . Fall at home 02/02/2018  . Chronic atrial fibrillation 07/27/2017  . Skin tear of elbow without complication 54/56/2563  . Leg wound, left, sequela 02/06/2017  . Alcohol abuse 10/14/2016  . Advance care planning 09/28/2014  . Thrombocytopenia ([place]) 09/13/2013  . GI bleed 09/12/2013  . Transient amnesia 08/13/2013  . Congestive dilated cardiomyopathy ([place]) 06/30/2013  . CAD (coronary artery disease) 06/09/2013  . Orthostatic hypotension 05/31/2013  . Medicare annual wellness visit, subsequent 02/06/2013  . Systolic CHF, chronic ([place]) 10/22/2011  . Wheeze 10/07/2011  . Hematuria, microscopic 08/17/2011  . Erectile dysfunction 02/09/2011  . RENAL INSUFFICIENCY 02/18/2010  . ROSACEA 02/18/2010  . FOOT PAIN, BILATERAL 02/28/2009  . DRY EYE SYNDROME 01/09/2008  . Paroxysmal SVT (supraventricular tachycardia) ([place]) 01/11/2007  . Gout 04/22/2003  . Atrial fibrillation, chronic ([place]) 01/20/2003  . Hyperlipidemia 05/22/2001  . History of diabetes mellitus 09/21/1992   Past Medical History:  Past Medical History:  Diagnosis Date  . Alcohol abuse   . Anticoagulated on Coumadin   . CAD (coronary artery disease)    a. 06/2013 Cath: LM nl, LAD nl, LCX 22m, RCA 40p, EF 25-30%.  . Diabetes mellitus, type 2 ([place]) 09/21/92  . Elbow fracture, right 2014   "healed by itself"  . Gout 04/22/2003   per Dr Jefm Bryant  . HFrEF (heart failure with reduced ejection fraction) ([place])     a.  02/2014 Echo: EF 20-25%, diff HK; b. 04/2014 CPX test: primary cirulatory limitation 2/2 advanced CHF.  Marland Kitchen History of colon polyps 10/26/02   colonoscopy  . History of GI bleed    a. 08/2013 Seen by GI at [facility]- felt to be diverticular in origin. Resolved off Coumadin. Cleared to resume per GI.  Marland Kitchen History of tobacco abuse   . Hyperlipidemia 05/22/01  . Hypertension   . Hypogonadism male   . Knee pain, right    [facility], Dr Jefm Bryant with rheumatology  . Mitral regurgitation    a. 02/2014 Echo: mild to mod MR.  . Nonischemic cardiomyopathy ([place])    a. 06/2013 Cath: nonobs dzs, EF 20-25; b. 02/2014 Echo: EF 20-25%, diff HK, mild AI, mild to mod MR, mod dil LA, mildly reduced RV fxn, mildly dil RA, PASP wnl.  . Permanent atrial fibrillation ([place]) 01/20/2003   a. CHA2DS2VASc = 7-->coumadin.  Marland Kitchen TIA (transient ischemic attack)    a. 07/2013 Admitted w/ transient AMS->No stroke;  b. 08/2013 Carotid U/S: <50% bilat ICA stenosis.   Past Surgical History:  Past Surgical History:  Procedure Laterality Date  . APPENDECTOMY    . BUNIONECTOMY Right 02/2004   R MTP  . [place]   [facility]  . CARDIAC CATHETERIZATION  10/14   [facility]- 50% mid LCx, 40% prox RCA  . CYST EXCISION  08/07/2013   head  . TONSILLECTOMY     HPI:  84 y.o. male with below list of previous medical conditions including alcohol abuse, CAD, congestive heart failure previous gastrointestinal bleeding presents to  the emergency department via EMS with history of fall yesterday. Patient states that tonight the patient had a approximately 2 to 3-minute episode in front of the family of seizure-like activity while seated in a chair. Upon EMS arrival the pt was combative requiring chemical sedation.   Assessment / Plan / Recommendation Clinical Impression  Pt scored a 20/30 on the SLUMS, suggestive of cognitive impairment that is likely mild-moderate. Pt acknowledged some of these  impairments, especially his reduced recall, acknowledging that this was an acute change from baseline. His selective attention, working memory, and thought organization are also effected. He would benefit from [facility] SLP f/u and supervision upon initial return home. Pt in agreement. Given plan for d/c home today, will defer additional f/u to next level of care.     SLP Assessment  SLP Recommendation/Assessment: All further Speech Lanaguage Pathology  needs can be addressed in the next venue of care SLP Visit Diagnosis: Cognitive communication deficit (R41.841)    Follow Up Recommendations  Outpatient SLP;24 hour supervision/assistance    Frequency and Duration           SLP Evaluation Cognition  Overall Cognitive Status: Impaired/Different from baseline Arousal/Alertness: Awake/alert Orientation Level: Oriented X4 Attention: Sustained;Selective Sustained Attention: Appears intact Selective Attention: Impaired Selective Attention Impairment: Verbal basic Memory: Impaired Memory Impairment: Retrieval deficit;Decreased short term memory Decreased Short Term Memory: Verbal basic Awareness: Impaired Awareness Impairment: Anticipatory impairment Problem Solving: Impaired Problem Solving Impairment: Functional complex Safety/Judgment: Impaired       Comprehension  Auditory Comprehension Overall Auditory Comprehension: Appears within functional limits for tasks assessed    Expression Expression Primary Mode of Expression: Verbal Verbal Expression Overall Verbal Expression: Appears within functional limits for tasks assessed Written Expression Dominant Hand: Right   Oral / Motor  Motor Speech Overall Motor Speech: Appears within functional limits for tasks assessed   GO                    Osie Bond., M.A. [facility] Pager [phone] Office [phone]  05/08/2020, 12:11 PM

## 2020-05-08 NOTE — ED Notes (Signed)
[place](Wife) please call [phone](home) and cell [phone] with any updates and when transferring to [facility].

## 2020-05-08 NOTE — Plan of Care (Signed)
Patient WDL of baseline and A&Ox4.  Working with therapies and discharging to home today.

## 2020-05-08 NOTE — Discharge Summary (Signed)
Patient ID: Benjamin Soto [id number] Mar 22, 1936 84 y.o.  Admit date: 05/08/2020 Discharge date: 05/08/2020  Admitting Diagnosis: [facility]  Discharge Diagnosis Patient Active Problem List   Diagnosis Date Noted  . TBI (traumatic brain injury) ([place]) 05/08/2020  . Cellulitis of left lower extremity 04/12/2020  . Type 2 diabetes mellitus with complication, without long-term current use of insulin ([place]) 08/31/2018  . Bruising 06/17/2018  . HTN (hypertension) 02/02/2018  . Fall at home 02/02/2018  . Chronic atrial fibrillation 07/27/2017  . Skin tear of elbow without complication 83/15/1761  . Leg wound, left, sequela 02/06/2017  . Alcohol abuse 10/14/2016  . Advance care planning 09/28/2014  . Thrombocytopenia ([place]) 09/13/2013  . GI bleed 09/12/2013  . Transient amnesia 08/13/2013  . Congestive dilated cardiomyopathy ([place]) 06/30/2013  . CAD (coronary artery disease) 06/09/2013  . Orthostatic hypotension 05/31/2013  . Medicare annual wellness visit, subsequent 02/06/2013  . Systolic CHF, chronic ([place]) 10/22/2011  . Wheeze 10/07/2011  . Hematuria, microscopic 08/17/2011  . Erectile dysfunction 02/09/2011  . RENAL INSUFFICIENCY 02/18/2010  . ROSACEA 02/18/2010  . FOOT PAIN, BILATERAL 02/28/2009  . DRY EYE SYNDROME 01/09/2008  . Paroxysmal SVT (supraventricular tachycardia) ([place]) 01/11/2007  . Gout 04/22/2003  . Atrial fibrillation, chronic ([place]) 01/20/2003  . Hyperlipidemia 05/22/2001  . History of diabetes mellitus 09/21/1992    Consultants [place]  Reason for Admission: [place], rib frx  Procedures none  Hospital Course:  22M s/p mechanical fall on coumadin for afib, but not therapeutic. Rec'd vitK. NSGY c/s, no repeat head CT. Ambulatory with PT, pain controlled, voiding, tolerating diet. Hold coumadin x2 weeks per NSGY. Eval by PCP/cards for permanent d/c of coumadin.   Physical Exam: Gen: comfortable, no distress Neuro: non-focal exam HEENT: PERRL Neck: supple CV:  RRR Pulm: unlabored breathing Abd: soft, NT GU: clear yellow urine Extr: wwp, no edema   Allergies as of 05/08/2020      Reactions   Lisinopril    REACTION: hyperkalemia at high dose   Tramadol    constipation   Varenicline Tartrate    REACTION: vivid dreams, nausea      Medication List    STOP taking these medications   warfarin 5 MG tablet Commonly known as: COUMADIN     TAKE these medications   allopurinol 100 MG tablet Commonly known as: ZYLOPRIM Take 1 tablet (100 mg total) by mouth daily.   brimonidine 0.2 % ophthalmic solution Commonly known as: ALPHAGAN Place into both eyes 2 (two) times daily.   carvedilol 3.125 MG tablet Commonly known as: COREG Take 1 tablet (3.125 mg total) by mouth 2 (two) times daily with a meal.   cephALEXin 500 MG capsule Commonly known as: KEFLEX Take 1 capsule (500 mg total) by mouth 3 (three) times daily.   furosemide 40 MG tablet Commonly known as: LASIX TAKE 1/2 TABLET BY MOUTH EVERY DAY AS NEEDED. What changed:   how much to take  how to take this  when to take this   ipratropium 0.06 % nasal spray Commonly known as: ATROVENT SPRAY 2 SPRAYS INTO EACH NOSTRIL EVERY DAY   latanoprost 0.005 % ophthalmic solution Commonly known as: XALATAN Place 1 drop into both eyes at bedtime.   levETIRAcetam 500 MG tablet Commonly known as: KEPPRA Take 1 tablet (500 mg total) by mouth 2 (two) times daily.   simvastatin 20 MG tablet Commonly known as: ZOCOR TAKE 1 TABLET(20 MG) BY MOUTH DAILY   vitamin C 1000 MG tablet Take 1,000 mg  by mouth daily.         Follow-up Information    Dawley, Troy C, DO Follow up in 3 week(s).   Why: call for appointment, will need CT brain beforehand Contact information: [address] [place] [phone]                Signed: Jesusita Oka, [facility] 05/08/2020, 9:50 AM

## 2020-05-08 NOTE — Telephone Encounter (Signed)
Pt spouse called to say pt just got home from [place] today.  They told him to cancel his coumadin and stop coumadin.  Pt had blood on his brain and seizure pt has hospital follow up 8/26

## 2020-05-08 NOTE — Care Management CC44 (Signed)
Condition Code 44 Documentation Completed  Patient Details  Name: Benjamin Soto MRN: [id number] Date of Birth: 1936-02-03   Condition Code 44 given:  Yes Patient signature on Condition Code 44 notice:  Yes Documentation of 2 MD's agreement:  Yes Code 44 added to claim:  Yes    Ella Bodo, RN 05/08/2020, 11:59 AM

## 2020-05-08 NOTE — Telephone Encounter (Signed)
Noted  

## 2020-05-08 NOTE — H&P (Signed)
Mancil Pfenning is an 84 y.o. male.   Chief Complaint: Fall, ?Sz HPI: 84 year old male with a history of coronary artery disease, CHF, atrial fibrillation on Coumadin fell 2 days ago.  Initially thought he just had some skin tears on his right forearm and felt okay.  The following day he had a 2 to 3-minute episode of seizure-like activity while sitting in a chair witnessed by his family so he was taken to [facility] for further evaluation.  Work-up there demonstrated a small subarachnoid hemorrhage.  He was also found to have right rib fractures 5-8.  He was accepted and transferred for admission to the trauma service.  He was given vitamin K to reverse his Coumadin.  He did not get Kcentra because his INR was only 1.5.  On arrival to [facility], GCS is 15, he complains of some mild headache.  He denies any rib or chest wall tenderness.  Past Medical History:  Diagnosis Date  . Alcohol abuse   . Anticoagulated on Coumadin   . CAD (coronary artery disease)    a. 06/2013 Cath: LM nl, LAD nl, LCX 94m, RCA 40p, EF 25-30%.  . Diabetes mellitus, type 2 ([place]) 09/21/92  . Elbow fracture, right 2014   "healed by itself"  . Gout 04/22/2003   per Dr Jefm Bryant  . HFrEF (heart failure with reduced ejection fraction) ([place])    a.  02/2014 Echo: EF 20-25%, diff HK; b. 04/2014 CPX test: primary cirulatory limitation 2/2 advanced CHF.  Marland Kitchen History of colon polyps 10/26/02   colonoscopy  . History of GI bleed    a. 08/2013 Seen by GI at [facility]- felt to be diverticular in origin. Resolved off Coumadin. Cleared to resume per GI.  Marland Kitchen History of tobacco abuse   . Hyperlipidemia 05/22/01  . Hypertension   . Hypogonadism male   . Knee pain, right    [facility], Dr Jefm Bryant with rheumatology  . Mitral regurgitation    a. 02/2014 Echo: mild to mod MR.  . Nonischemic cardiomyopathy ([place])    a. 06/2013 Cath: nonobs dzs, EF 20-25; b. 02/2014 Echo: EF 20-25%, diff HK, mild AI, mild to mod MR, mod dil LA, mildly reduced RV fxn,  mildly dil RA, PASP wnl.  . Permanent atrial fibrillation ([place]) 01/20/2003   a. CHA2DS2VASc = 7-->coumadin.  Marland Kitchen TIA (transient ischemic attack)    a. 07/2013 Admitted w/ transient AMS->No stroke;  b. 08/2013 Carotid U/S: <50% bilat ICA stenosis.    Past Surgical History:  Procedure Laterality Date  . APPENDECTOMY    . BUNIONECTOMY Right 02/2004   R MTP  . [place]   normal[facility]  . CARDIAC CATHETERIZATION  10/14   [facility]- 50% mid LCx, 40% prox RCA  . CYST EXCISION  08/07/2013   head  . TONSILLECTOMY      Family History  Problem Relation Age of Onset  . Diabetes Father   . Other Mother        Deceased  . Healthy Son   . Colon cancer Daughter        Cancer free  . Prostate cancer Neg Hx    Social History:  reports that he has quit smoking. His smoking use included cigarettes and pipe. He has a 11.00 pack-year smoking history. He has never used smokeless tobacco. He reports current alcohol use of about 4.0 standard drinks of alcohol per week. He reports that he does not use drugs.  Allergies:  Allergies  Allergen Reactions  . Lisinopril     REACTION: hyperkalemia at high dose  . Tramadol     constipation  . Varenicline Tartrate     REACTION: vivid dreams, nausea    Medications Prior to Admission  Medication Sig Dispense Refill  . allopurinol (ZYLOPRIM) 100 MG tablet Take 1 tablet (100 mg total) by mouth daily. 90 tablet 1  . Ascorbic Acid (VITAMIN C) 1000 MG tablet Take 1,000 mg by mouth daily.    . brimonidine (ALPHAGAN) 0.2 % ophthalmic solution Place into both eyes 2 (two) times daily.    . carvedilol (COREG) 3.125 MG tablet Take 1 tablet (3.125 mg total) by mouth 2 (two) times daily with a meal. 180 tablet 3  . cephALEXin (KEFLEX) 500 MG capsule Take 1 capsule (500 mg total) by mouth 3 (three) times daily. (Patient not taking: Reported on 05/08/2020) 21 capsule 0  . furosemide (LASIX) 40 MG tablet TAKE 1/2 TABLET BY MOUTH EVERY DAY  AS NEEDED. (Patient taking differently: Take 20 mg by mouth daily. TAKE 1/2 TABLET BY MOUTH EVERY DAY AS NEEDED.) 90 tablet 1  . ipratropium (ATROVENT) 0.06 % nasal spray SPRAY 2 SPRAYS INTO EACH NOSTRIL EVERY DAY 15 mL 5  . latanoprost (XALATAN) 0.005 % ophthalmic solution Place 1 drop into both eyes at bedtime.    . simvastatin (ZOCOR) 20 MG tablet TAKE 1 TABLET(20 MG) BY MOUTH DAILY 90 tablet 1  . warfarin (COUMADIN) 5 MG tablet Take as directed by anticoagulation clinic. 90 tablet 2    Results for orders placed or performed during the hospital encounter of 05/07/20 (from the past 48 hour(s))  CBC WITH DIFFERENTIAL     Status: Abnormal   Collection Time: 05/08/20 12:14 AM  Result Value Ref Range   WBC 7.3 4.0 - 10.5 K/uL   RBC 4.09 (L) 4.22 - 5.81 MIL/uL   Hemoglobin 13.2 13.0 - 17.0 g/dL   HCT 39.1 39 - 52 %   MCV 95.6 80.0 - 100.0 fL   MCH 32.3 26.0 - 34.0 pg   MCHC 33.8 30.0 - 36.0 g/dL   RDW 14.9 11.5 - 15.5 %   Platelets 154 150 - 400 K/uL   nRBC 0.0 0.0 - 0.2 %   Neutrophils Relative % 78 %   Neutro Abs 5.6 1.7 - 7.7 K/uL   Lymphocytes Relative 12 %   Lymphs Abs 0.9 0.7 - 4.0 K/uL   Monocytes Relative 9 %   Monocytes Absolute 0.7 0 - 1 K/uL   Eosinophils Relative 1 %   Eosinophils Absolute 0.1 0 - 0 K/uL   Basophils Relative 0 %   Basophils Absolute 0.0 0 - 0 K/uL   Immature Granulocytes 0 %   Abs Immature Granulocytes 0.03 0.00 - 0.07 K/uL    Comment: Performed at [facility], [address] [place], [place] [address]  Comprehensive metabolic panel     Status: Abnormal   Collection Time: 05/08/20 12:14 AM  Result Value Ref Range   Sodium 132 (L) 135 - 145 mmol/L   Potassium 4.2 3.5 - 5.1 mmol/L   Chloride 92 (L) 98 - 111 mmol/L   CO2 31 22 - 32 mmol/L   Glucose, Bld 141 (H) 70 - 99 mg/dL    Comment: Glucose reference range applies only to samples taken after fasting for at least 8 hours.   BUN 29 (H) 8 - 23 mg/dL   Creatinine, Ser 1.48 (H) 0.61 - 1.24  mg/dL  Calcium 9.1 8.9 - 10.3 mg/dL   Total Protein 6.6 6.5 - 8.1 g/dL   Albumin 4.1 3.5 - 5.0 g/dL   AST 27 15 - 41 U/L   ALT 26 0 - 44 U/L   Alkaline Phosphatase 54 38 - 126 U/L   Total Bilirubin 1.2 0.3 - 1.2 mg/dL   GFR calc non Af Amer 43 (L) >60 mL/min   GFR calc Af Amer 50 (L) >60 mL/min   Anion gap 9 5 - 15    Comment: Performed at [facility], [place], [place] [address]  Troponin I (High Sensitivity)     Status: None   Collection Time: 05/08/20 12:14 AM  Result Value Ref Range   Troponin I (High Sensitivity) 10 <18 ng/L    Comment: (NOTE) Elevated high sensitivity troponin I (hsTnI) values and significant  changes across serial measurements may suggest ACS but many other  chronic and acute conditions are known to elevate hsTnI results.  Refer to the "Links" section for chest pain algorithms and additional  guidance. Performed at [facility], [address] [place], [place] [address]   CK     Status: None   Collection Time: 05/08/20 12:14 AM  Result Value Ref Range   Total CK 142 49.0 - 397.0 U/L    Comment: Performed at [facility], [address] [place], [place] [address]  Protime-INR     Status: Abnormal   Collection Time: 05/08/20 12:14 AM  Result Value Ref Range   Prothrombin Time 17.7 (H) 11.4 - 15.2 seconds   INR 1.5 (H) 0.8 - 1.2    Comment: (NOTE) INR goal varies based on device and disease states. Performed at [facility], [address] [place], [place] [address]   Ethanol     Status: None   Collection Time: 05/08/20 12:14 AM  Result Value Ref Range   Alcohol, Ethyl (B) <10 <10 mg/dL    Comment: (NOTE) Lowest detectable limit for serum alcohol is 10 mg/dL.  For medical purposes only. Performed at [facility], [address] [place],  [address]   SARS Coronavirus 2 by RT PCR (hospital order, performed in [facility] hospital lab) Nasopharyngeal Nasopharyngeal Swab      Status: None   Collection Time: 05/08/20 12:32 AM   Specimen: Nasopharyngeal Swab  Result Value Ref Range   SARS Coronavirus 2 NEGATIVE NEGATIVE    Comment: (NOTE) SARS-CoV-2 target nucleic acids are NOT DETECTED.  The SARS-CoV-2 RNA is generally detectable in upper and lower respiratory specimens during the acute phase of infection. The lowest concentration of SARS-CoV-2 viral copies this assay can detect is 250 copies / mL. A negative result does not preclude SARS-CoV-2 infection and should not be used as the sole basis for treatment or other patient management decisions.  A negative result may occur with improper specimen collection / handling, submission of specimen other than nasopharyngeal swab, presence of viral mutation(s) within the areas targeted by this assay, and inadequate number of viral copies (<250 copies / mL). A negative result must be combined with clinical observations, patient history, and epidemiological information.  Fact Sheet for Patients:   StrictlyIdeas.no  Fact Sheet for Healthcare Providers: BankingDealers.co.za  This test is not yet approved or  cleared by the [place] FDA and has been authorized for detection and/or diagnosis of SARS-CoV-2 by FDA under an Emergency Use Authorization (EUA).  This EUA will remain in effect (meaning this test can  be used) for the duration of the COVID-19 declaration under Section 564(b)(1) of the Act, 21 U.S.C. section 360bbb-3(b)(1), unless the authorization is terminated or revoked sooner.  Performed at [facility], [place], [place] [address]   Troponin I (High Sensitivity)     Status: None   Collection Time: 05/08/20  4:43 AM  Result Value Ref Range   Troponin I (High Sensitivity) 10 <18 ng/L    Comment: (NOTE) Elevated high sensitivity troponin I (hsTnI) values and significant  changes across serial measurements may suggest ACS  but many other  chronic and acute conditions are known to elevate hsTnI results.  Refer to the "Links" section for chest pain algorithms and additional  guidance. Performed at [facility], [address] [place], [place] [address]   Urinalysis, Complete w Microscopic     Status: Abnormal   Collection Time: 05/08/20  4:47 AM  Result Value Ref Range   Color, Urine YELLOW (A) YELLOW   APPearance HAZY (A) CLEAR   Specific Gravity, Urine 1.010 1.005 - 1.030   pH 5.0 5.0 - 8.0   Glucose, UA NEGATIVE NEGATIVE mg/dL   Hgb urine dipstick NEGATIVE NEGATIVE   Bilirubin Urine NEGATIVE NEGATIVE   Ketones, ur NEGATIVE NEGATIVE mg/dL   Protein, ur NEGATIVE NEGATIVE mg/dL   Nitrite NEGATIVE NEGATIVE   Leukocytes,Ua NEGATIVE NEGATIVE   RBC / HPF 0-5 0 - 5 RBC/hpf   WBC, UA 0-5 0 - 5 WBC/hpf   Bacteria, UA MANY (A) NONE SEEN   Squamous Epithelial / LPF 0-5 0 - 5    Comment: Performed at [facility], [address] [place], [place] [address]   CT HEAD WO CONTRAST  Result Date: 05/08/2020 CLINICAL DATA:  Seizure.  Fall while on anti coagulation. EXAM: CT HEAD WITHOUT CONTRAST CT CERVICAL SPINE WITHOUT CONTRAST TECHNIQUE: Multidetector CT imaging of the head and cervical spine was performed following the standard protocol without intravenous contrast. Multiplanar CT image reconstructions of the cervical spine were also generated. COMPARISON:  None. FINDINGS: CT HEAD FINDINGS Brain: There is a small amount of subarachnoid hemorrhage over the anterior left convexity. The size and configuration of the ventricles and extra-axial CSF spaces are normal. There is hypoattenuation of the periventricular white matter, most commonly indicating chronic ischemic microangiopathy. Vascular: No abnormal hyperdensity of the major intracranial arteries or dural venous sinuses. No intracranial atherosclerosis. Skull: The visualized skull base, calvarium and extracranial soft tissues are normal.  Sinuses/Orbits: No fluid levels or advanced mucosal thickening of the visualized paranasal sinuses. No mastoid or middle ear effusion. The orbits are normal. CT CERVICAL SPINE FINDINGS Alignment: Grade 1 C5-6 anterolisthesis. Skull base and vertebrae: No acute fracture. Soft tissues and spinal canal: No prevertebral fluid or swelling. No visible canal hematoma. Disc levels: No advanced spinal canal or neural foraminal stenosis. Upper chest: No pneumothorax, pulmonary nodule or pleural effusion. Other: Normal visualized paraspinal cervical soft tissues. IMPRESSION: 1. Small amount of subarachnoid hemorrhage over the anterior left convexity, likely traumatic. 2. No acute fracture or static subluxation of the cervical spine. 3. Critical Value/emergent results were called by telephone at the time of interpretation on 05/08/2020 at 12:15 am to provider [facility] , who verbally acknowledged these results. Electronically Signed   By: Ulyses Jarred M.D.   On: 05/08/2020 00:16   CT CERVICAL SPINE WO CONTRAST  Result Date: 05/08/2020 CLINICAL DATA:  Seizure.  Fall while on anti coagulation. EXAM: CT HEAD WITHOUT CONTRAST CT CERVICAL SPINE  WITHOUT CONTRAST TECHNIQUE: Multidetector CT imaging of the head and cervical spine was performed following the standard protocol without intravenous contrast. Multiplanar CT image reconstructions of the cervical spine were also generated. COMPARISON:  None. FINDINGS: CT HEAD FINDINGS Brain: There is a small amount of subarachnoid hemorrhage over the anterior left convexity. The size and configuration of the ventricles and extra-axial CSF spaces are normal. There is hypoattenuation of the periventricular white matter, most commonly indicating chronic ischemic microangiopathy. Vascular: No abnormal hyperdensity of the major intracranial arteries or dural venous sinuses. No intracranial atherosclerosis. Skull: The visualized skull base, calvarium and extracranial soft tissues are  normal. Sinuses/Orbits: No fluid levels or advanced mucosal thickening of the visualized paranasal sinuses. No mastoid or middle ear effusion. The orbits are normal. CT CERVICAL SPINE FINDINGS Alignment: Grade 1 C5-6 anterolisthesis. Skull base and vertebrae: No acute fracture. Soft tissues and spinal canal: No prevertebral fluid or swelling. No visible canal hematoma. Disc levels: No advanced spinal canal or neural foraminal stenosis. Upper chest: No pneumothorax, pulmonary nodule or pleural effusion. Other: Normal visualized paraspinal cervical soft tissues. IMPRESSION: 1. Small amount of subarachnoid hemorrhage over the anterior left convexity, likely traumatic. 2. No acute fracture or static subluxation of the cervical spine. 3. Critical Value/emergent results were called by telephone at the time of interpretation on 05/08/2020 at 12:15 am to provider [facility] , who verbally acknowledged these results. Electronically Signed   By: Ulyses Jarred M.D.   On: 05/08/2020 00:16   DG Chest Portable 1 View  Result Date: 05/07/2020 CLINICAL DATA:  Status post seizure and subsequent fall. EXAM: PORTABLE CHEST 1 VIEW COMPARISON:  August 09, 2013 FINDINGS: There is no evidence of acute infiltrate, pleural effusion or pneumothorax. The heart size and mediastinal contours are within normal limits. There is marked severity calcification of the aortic arch. Acute fifth, sixth, seventh and eighth right-sided rib fractures are seen. IMPRESSION: Acute fifth, sixth, seventh and eighth right-sided rib fractures. Electronically Signed   By: Virgina Norfolk M.D.   On: 05/07/2020 23:54   DG Hip Unilat W or Wo Pelvis 2-3 Views Left  Result Date: 05/07/2020 CLINICAL DATA:  Status post fall. EXAM: DG HIP (WITH OR WITHOUT PELVIS) 2-3V LEFT COMPARISON:  None. FINDINGS: There is no evidence of an acute hip fracture or dislocation. Mild to moderate severity degenerative changes seen involving both hips. Moderate severity  vascular calcification is noted. IMPRESSION: No acute osseous abnormality. Electronically Signed   By: Virgina Norfolk M.D.   On: 05/07/2020 23:53    Review of Systems  Constitutional: Negative.   HENT: Negative.   Eyes: Negative.   Respiratory: Negative for shortness of breath and wheezing.   Cardiovascular: Negative for chest pain.  Gastrointestinal: Negative.   Endocrine: Negative.   Genitourinary: Negative.   Musculoskeletal: Negative.   Skin: Negative.   Allergic/Immunologic: Negative.   Neurological: Positive for dizziness and headaches.  Hematological: Negative.   Psychiatric/Behavioral: Negative.     Blood pressure (!) 170/147, pulse 83, temperature 97.7 F (36.5 C), temperature source Axillary, resp. rate 19, SpO2 94 %. Physical Exam Constitutional:      General: He is not in acute distress.    Appearance: He is not diaphoretic.  HENT:     Head: Normocephalic.     Right Ear: External ear normal.     Left Ear: External ear normal.     Nose: Nose normal.     Mouth/Throat:     Mouth: Mucous membranes are moist.  Pharynx: No oropharyngeal exudate.  Eyes:     General: No scleral icterus.    Extraocular Movements: Extraocular movements intact.     Pupils: Pupils are equal, round, and reactive to light.  Cardiovascular:     Rate and Rhythm: Normal rate. Rhythm irregular.     Pulses: Normal pulses.     Heart sounds: No murmur heard.   Pulmonary:     Effort: Pulmonary effort is normal. No respiratory distress.     Breath sounds: Normal breath sounds. No wheezing or rhonchi.  Chest:     Chest wall: No tenderness.  Abdominal:     General: Abdomen is flat. There is no distension.     Palpations: There is no mass.     Tenderness: There is no abdominal tenderness. There is no guarding.  Musculoskeletal:     Cervical back: Normal range of motion. No rigidity or tenderness.     Comments: Several contusions and skin tears with dressing right forearm  Skin:     General: Skin is warm.     Capillary Refill: Capillary refill takes 2 to 3 seconds.  Neurological:     Mental Status: He is alert.     Sensory: No sensory deficit.     Comments: GCS 15, mildly hard of hearing, otherwise no cranial nerve deficit, moves all extremities well with good strength  Psychiatric:        Mood and Affect: Mood normal.      Assessment/Plan Fall TBI with small left subarachnoid hemorrhage -I consulted Dr. Reatha Armour.  He recommends no further follow-up head CT, continue to hold Coumadin.  We will begin TBI team therapies. Right rib fractures 5-8 -Multimodal pain control and pulmonary toilet.  Patient is not having significant pain or tenderness from this.  Chest x-ray in a.m. Skin tears and contusions right forearm -local care MMP -Home meds including beta-blocker  Admit to ICU, inpatient.   Zenovia Jarred, MD 05/08/2020, 8:09 AM

## 2020-05-08 NOTE — TOC Transition Note (Signed)
Transition of Care [facility]) - CM/SW Discharge Note   Patient Details  Name: Benjamin Soto MRN: [id number] Date of Birth: 1936/07/23  Transition of [facility]) CM/SW Contact:  Ella Bodo, RN Phone Number: 05/08/2020, 12:14 PM   Clinical Narrative: 84 year old male admitted via EMS with history of fall yesterday; per family patient had a 2 to 3-minute episode of seizure-like activity while seated in a chair.  Work-up revealed a small subarachnoid hemorrhage, as well as right rib fractures, 5-8.  Prior to admission, patient independent and living at home with spouse.  PT/OT/ST recommending home health follow-up, and patient agreeable to therapies.  Referral to [facility] for home health needs.  Requested rolling walker as recommended by PT from [employer], to be delivered to bedside prior to discharge.    Final next level of care: Home w Home Health Services Barriers to Discharge: Barriers Resolved   Patient Goals and CMS Choice   CMS Medicare.gov Compare Post Acute Care list provided to:: Patient Choice offered to / list presented to : Patient                         Discharge Plan and Services   Discharge Planning Services: CM Consult Post Acute Care Choice: Home Health          DME Arranged: Walker rolling DME Agency: [employer] Date DME Agency Contacted: 05/08/20 Time DME Agency Contacted: 1211 Representative spoke with at DME Agency: [place]: PT, OT, Speech Therapy [place] Agency: [place] Date [place]: 05/08/20 Time [place]: 1212 Representative spoke with at [place]: [place] ([place]) Interventions     Readmission Risk Interventions Readmission Risk Prevention Plan 05/08/2020  Post Dischage Appt Complete  Medication Screening Complete  Transportation Screening Complete  Some recent data might be hidden   Reinaldo Raddle, RN, BSN  Trauma/Neuro ICU Case  Manager [phone]

## 2020-05-08 NOTE — Evaluation (Signed)
Occupational Therapy Evaluation Patient Details Name: Benjamin Soto MRN: [id number] DOB: 13-Sep-1936 Today's Date: 05/08/2020    History of Present Illness 84 y.o. male with below list of previous medical conditions including alcohol abuse, CAD, congestive heart failure previous gastrointestinal bleeding presents to the emergency department via EMS with history of fall yesterday. Patient states that tonight the patient had a approximately 2 to 3-minute episode in front of the family of seizure-like activity while seated in a chair. Upon EMS arrival the pt was combative requiring chemical sedation.   Clinical Impression   Patient evaluated by Occupational Therapy with no further acute OT needs identified. All education has been completed and the patient has no further questions. Pt requires min guard assist with ADLs and demonstrates cognitive deficits - see below for details.  He lives with wife and adult son and is planning to discharge home today.  He is not interested in shower seat.  All further OT needs can be addressed by [facility].  See below for any follow-up Occupational Therapy or equipment needs. OT is signing off. Thank you for this referral.      Follow Up Recommendations  Home health OT;Supervision - Intermittent    Equipment Recommendations  None recommended by OT    Recommendations for Other Services       Precautions / Restrictions Precautions Precautions: Fall      Mobility Bed Mobility Overal bed mobility: Needs Assistance Bed Mobility: Supine to Sit;Sit to Supine     Supine to sit: Supervision Sit to supine: Supervision   General bed mobility comments: increased time and effort   Transfers Overall transfer level: Needs assistance Equipment used: Rolling walker (2 wheeled);None Transfers: Sit to/from [employer] to Stand: Supervision Stand pivot transfers: Min guard            Balance Overall balance assessment: Needs  assistance Sitting-balance support: No upper extremity supported;Feet supported Sitting balance-Leahy Scale: Good     Standing balance support: No upper extremity supported Standing balance-Leahy Scale: Fair Standing balance comment: able to maintain static standing without UE support                            ADL either performed or assessed with clinical judgement   ADL Overall ADL's : Needs assistance/impaired Eating/Feeding: Independent   Grooming: Wash/dry hands;Wash/dry face;Oral care;Brushing hair;Min guard;Standing   Upper Body Bathing: Min guard;Standing   Lower Body Bathing: Min guard;Sit to/from stand Lower Body Bathing Details (indicate cue type and reason): simulated shower in standing.  He lost his balance 3 times even when stabilizing on a surface.  Discussed option of shower seat, but he states he doesn't need, nor want one.   Upper Body Dressing : Set up;Sitting   Lower Body Dressing: Min guard;Sit to/from stand   Toilet Transfer: Min guard;Ambulation;Grab bars;RW;Comfort height toilet   Toileting- Clothing Manipulation and Hygiene: Min guard;Sit to/from stand   Tub/ Shower Transfer: Walk-in shower;Min guard;Ambulation;Rolling walker   Functional mobility during ADLs: Min guard General ADL Comments: min guard for balance and safety      Vision Baseline Vision/History: Glaucoma Patient Visual Report: No change from baseline Vision Assessment?: Yes Eye Alignment: Within Functional Limits Ocular Range of Motion: Within Functional Limits Alignment/Gaze Preference: Within Defined Limits Tracking/Visual Pursuits: Other (comment) Visual Fields: No apparent deficits Additional Comments: Pt consistently lost fixation on Lt during pursuits      Perception Perception Perception Tested?: Yes   Praxis  Praxis Praxis tested?: Within functional limits    Pertinent Vitals/Pain Pain Assessment: Faces Faces Pain Scale: No hurt     Hand Dominance  Right   Extremity/Trunk Assessment Upper Extremity Assessment Upper Extremity Assessment: RUE deficits/detail RUE Deficits / Details: Pt demonstrates shoulder flexion ~115* with effort.  he reports a previous fall and subsequent injury to the "cuff" of the shoulder several years ago.    Lower Extremity Assessment Lower Extremity Assessment: Overall WFL for tasks assessed   Cervical / Trunk Assessment Cervical / Trunk Assessment: Kyphotic   Communication Communication Communication: HOH   Cognition Arousal/Alertness: Awake/alert Behavior During Therapy: WFL for tasks assessed/performed Overall Cognitive Status: Impaired/Different from baseline Area of Impairment: Attention;Memory;Safety/judgement;Awareness;Problem solving                   Current Attention Level: Selective Memory: Decreased short-term memory   Safety/Judgement: Decreased awareness of deficits Awareness: Intellectual Problem Solving: Difficulty sequencing;Requires verbal cues General Comments: Pt repeatedly states "I've never had problems with that" and requires cues to understand that he may function differently due to TBI and fall.   He is somewhat impulsive.  The Short Blessed Test was administered and pt scored 10/28 which indicates cognitive deficits.  He demonstrated deficits with sequencing, attention, memory, and problem solving    General Comments  VSS     Exercises     Shoulder Instructions      Home Living Family/patient expects to be discharged to:: Private residence Living Arrangements: Spouse/significant other;Children Available Help at Discharge: Family;Available 24 hours/day Type of Home: House Home Access: Stairs to enter [employer] of Steps: 2 Entrance Stairs-Rails: None Home Layout: Two level;Able to live on main level with bedroom/bathroom Alternate Level Stairs-Number of Steps: flight Alternate Level Stairs-Rails: Right;Left;Can reach both Bathroom Shower/Tub:  Occupational psychologist: Standard     Home Equipment: Kasandra Knudsen - single point      Lives With: Spouse    Prior Functioning/Environment Level of Independence: Independent        Comments: pt reports he utilizes cane for extended community distances        OT Problem List: Impaired balance (sitting and/or standing);Impaired vision/perception;Decreased cognition;Decreased safety awareness      OT Treatment/Interventions:      OT Goals(Current goals can be found in the care plan section) Acute Rehab OT Goals Patient Stated Goal: to go home soon  OT Goal Formulation: All assessment and education complete, DC therapy  OT Frequency:     Barriers to D/C:            Co-evaluation              AM-PAC OT "6 Clicks" Daily Activity     Outcome Measure Help from another person eating meals?: None Help from another person taking care of personal grooming?: A Little Help from another person toileting, which includes using toliet, bedpan, or urinal?: A Little Help from another person bathing (including washing, rinsing, drying)?: A Little Help from another person to put on and taking off regular upper body clothing?: A Little Help from another person to put on and taking off regular lower body clothing?: A Little 6 Click Score: 19   End of Session Nurse Communication: Mobility status  Activity Tolerance: Patient tolerated treatment well Patient left: in bed;with call bell/phone within reach;with bed alarm set  OT Visit Diagnosis: Unsteadiness on feet (R26.81);Cognitive communication deficit (R41.841)  Time: 2919-1660 OT Time Calculation (min): 26 min Charges:  OT General Charges $OT Visit: 1 Visit OT Evaluation $OT Eval Moderate Complexity: 1 Mod OT Treatments $Self Care/Home Management : 8-22 mins  Nilsa Nutting., OTR/L Acute Rehabilitation Services Pager [phone] Office [place], [place] 05/08/2020, 2:21 PM

## 2020-05-08 NOTE — Evaluation (Signed)
Physical Therapy Evaluation Patient Details Name: Benjamin Soto MRN: [id number] DOB: 1936/04/12 Today's Date: 05/08/2020   History of Present Illness  84 y.o. male with below list of previous medical conditions including alcohol abuse, CAD, congestive heart failure previous gastrointestinal bleeding presents to the emergency department via EMS with history of fall yesterday. Patient states that tonight the patient had a approximately 2 to 3-minute episode in front of the family of seizure-like activity while seated in a chair. Upon EMS arrival the pt was combative requiring chemical sedation.  Clinical Impression  Pt presents to PT with deficits in functional mobility, gait, balance, power, and endurance. Pt is unsteady when mobilizing without UE support, however this is mitigated with use of a RW. Pt is able to ambulate at a supervision level when utilizing RW. Pt will benefit from continued acute PT POC to improve balance and gait quality and to reduce falls risk. PT recommends the pt receive home health PT, a RW, and supervision from family for all OOB mobility upon initial return home.    Follow Up Recommendations Home health PT    Equipment Recommendations  Rolling walker with 5" wheels    Recommendations for Other Services       Precautions / Restrictions Precautions Precautions: Fall Restrictions Weight Bearing Restrictions: No      Mobility  Bed Mobility Overal bed mobility: Needs Assistance Bed Mobility: Supine to Sit     Supine to sit: Supervision        Transfers Overall transfer level: Needs assistance Equipment used: Rolling walker (2 wheeled);None Transfers: Sit to/from Stand Sit to Stand: Supervision            Ambulation/Gait Ambulation/Gait assistance: Supervision;Min assist Gait Distance (Feet): 200 Feet (additional trial of 100' with RW) Assistive device: Rolling walker (2 wheeled);None Gait Pattern/deviations: Step-through pattern;Drifts  right/left;Staggering right;Staggering left Gait velocity: reduced Gait velocity interpretation: 1.31 - 2.62 ft/sec, indicative of limited community ambulator General Gait Details: pt with increased sway with multiple lateral losses of balance and drift throughout gait assessment without UE support. With use of RW pt is much more steady with reduced deviation in gait path, no losses of balance noted  Stairs            Wheelchair Mobility    Modified Rankin (Stroke Patients Only) Modified Rankin (Stroke Patients Only) Pre-Morbid Rankin Score: No symptoms Modified Rankin: Moderately severe disability     Balance Overall balance assessment: Needs assistance Sitting-balance support: No upper extremity supported;Feet supported Sitting balance-Leahy Scale: Good     Standing balance support: No upper extremity supported Standing balance-Leahy Scale: Fair                               Pertinent Vitals/Pain Pain Assessment: Faces Faces Pain Scale: Hurts little more Pain Location: L hip Pain Descriptors / Indicators: Grimacing Pain Intervention(s): Monitored during session    Home Living Family/patient expects to be discharged to:: Private residence Living Arrangements: Spouse/significant other;Children Available Help at Discharge: Family;Available 24 hours/day Type of Home: House Home Access: Stairs to enter Entrance Stairs-Rails: None Entrance Stairs-Number of Steps: 2 Home Layout: Two level;Able to live on main level with bedroom/bathroom Home Equipment: Kasandra Knudsen - single point      Prior Function Level of Independence: Independent         Comments: pt reports he utilizes cane for extended community distances     Hand Dominance   Dominant Hand: Right  Extremity/Trunk Assessment   Upper Extremity Assessment Upper Extremity Assessment: Overall WFL for tasks assessed    Lower Extremity Assessment Lower Extremity Assessment: Overall WFL for tasks  assessed    Cervical / Trunk Assessment Cervical / Trunk Assessment: Kyphotic  Communication   Communication: HOH  Cognition Arousal/Alertness: Awake/alert Behavior During Therapy: WFL for tasks assessed/performed Overall Cognitive Status: Within Functional Limits for tasks assessed                                        General Comments General comments (skin integrity, edema, etc.): VSS on RA    Exercises     Assessment/Plan    PT Assessment Patient needs continued PT services  PT Problem List Decreased activity tolerance;Decreased balance;Decreased mobility;Decreased knowledge of use of DME;Decreased knowledge of precautions       PT Treatment Interventions DME instruction;Gait training;Stair training;Functional mobility training;Therapeutic activities;Therapeutic exercise;Neuromuscular re-education;Balance training;Patient/family education    PT Goals (Current goals can be found in the Care Plan section)  Acute Rehab PT Goals Patient Stated Goal: To reduce falls risk PT Goal Formulation: With patient Time For Goal Achievement: 05/22/20 Potential to Achieve Goals: Good    Frequency Min 5X/week   Barriers to discharge        Co-evaluation               AM-PAC PT "6 Clicks" Mobility  Outcome Measure Help needed turning from your back to your side while in a flat bed without using bedrails?: None Help needed moving from lying on your back to sitting on the side of a flat bed without using bedrails?: None Help needed moving to and from a bed to a chair (including a wheelchair)?: None Help needed standing up from a chair using your arms (e.g., wheelchair or bedside chair)?: None Help needed to walk in hospital room?: None Help needed climbing 3-5 steps with a railing? : None 6 Click Score: 24    End of Session   Activity Tolerance: Patient tolerated treatment well Patient left: in chair;with call bell/phone within reach;with chair alarm  set Nurse Communication: Mobility status PT Visit Diagnosis: Unsteadiness on feet (R26.81);Other abnormalities of gait and mobility (R26.89);History of falling (Z91.81)    Time: 3419-6222 PT Time Calculation (min) (ACUTE ONLY): 35 min   Charges:   PT Evaluation $PT Eval Moderate Complexity: 1 Mod          Zenaida Niece, PT, DPT Acute Rehabilitation Pager: [phone]   Zenaida Niece 05/08/2020, 9:43 AM

## 2020-05-08 NOTE — Consult Note (Signed)
   Providing Compassionate, Quality Care - Together  Neurosurgery Consult  Referring physician: Dr. Georganna Skeans Reason for referral: SDH, ? Sz  Chief Complaint: Fall  History of Present Illness: This is an 84 year old male with a history of A. fib, CHF, CAD on Coumadin fell 2 days ago.  1 day ago he was noted to have a 2 to 3-minute episode of questionable seizure activity while sitting in a chair.  He was taken to [facility] for eval and transferred due to CAT scan findings of the brain showing a small traumatic subarachnoid hemorrhage.  Also has multiple rib fractures.  His INR was 1.5 therefore he was given vitamin K for reversal and his Coumadin was held.  He has no other complaints of weakness numbness or tingling at this time.  Denies any bowel or bladder changes.  History reviewed. No pertinent surgical history.  Medications: I have reviewed the patient's current medications. Allergies: No Known Allergies  History reviewed. No pertinent family history. Social History:  has no history on file for tobacco use, alcohol use, and drug use.  ROS: 14 point review of systems was obtained which all pertinent positives and negatives are listed in HPI above.  Physical Exam:  Vital signs in last 24 hours: Temp:  [98 F (36.7 C)-98.3 F (36.8 C)] 98 F (36.7 C) (07/25 1814) Pulse Rate:  [58-128] 65 (07/26 0746) Resp:  [11-18] 14 (07/26 0217) BP: (138-182)/(65-125) 153/88 (07/26 0700) SpO2:  [91 %-98 %] 96 % (07/26 0746) PE: AOx3 NCAT Face symmetric Cranial nerves II through XII intact EOMI Hard of hearing No drift Full strength bilateral upper and lower extremities Sensory intact to light touch throughout Some abrasions on the right forearm   Impression/Assessment:  84 year old male with 1.  Traumatic left frontal subarachnoid hemorrhage, without mass-effect 2.  Fall  Plan:  -No need for further imaging, he has a minimal amount of subarachnoid hemorrhage and is  neurologically intact -Recommend follow-up in 2 weeks with repeat CT while holding Coumadin at that time -Hold Coumadin x2 weeks -Hold anticoagulation x2 weeks -Continue Keppra -No acute neurosurgical intervention -We will sign off at this time please call with any questions or concerns   Thank you for allowing me to participate in this patient's care.  Please do not hesitate to call with questions or concerns.   Elwin Sleight, [facility] Cell: [phone]

## 2020-05-08 NOTE — Progress Notes (Signed)
Occupational Therapy Note  OT evaluation completed.  Cognitive deficits noted with pt scoring 10/28 on the Short Blessed Test which is indicative of cognitive deficit.  Recommend HHOT at discharge. Full note to follow.  Nilsa Nutting., OTR/L Acute Rehabilitation Services Pager [phone] Office [phone]

## 2020-05-08 NOTE — Telephone Encounter (Signed)
Duly noted.  I saw the inpatient notes.  I think it makes sense to keep the follow-up appointment as planned.  If they notice changes in the meantime then please let me know.  Thanks.

## 2020-05-08 NOTE — Care Management Obs Status (Signed)
[facility] NOTIFICATION   Patient Details  Name: Benjamin Soto MRN: [id number] Date of Birth: November 16, 1935   Medicare Observation Status Notification Given:       Ella Bodo, RN 05/08/2020, 11:58 AM

## 2020-05-09 ENCOUNTER — Ambulatory Visit: Payer: Medicare Other

## 2020-05-10 ENCOUNTER — Other Ambulatory Visit: Payer: Self-pay | Admitting: Neurological Surgery

## 2020-05-13 ENCOUNTER — Other Ambulatory Visit: Payer: Self-pay | Admitting: Neurological Surgery

## 2020-05-13 DIAGNOSIS — I4821 Permanent atrial fibrillation: Secondary | ICD-10-CM | POA: Diagnosis not present

## 2020-05-13 DIAGNOSIS — I110 Hypertensive heart disease with heart failure: Secondary | ICD-10-CM | POA: Diagnosis not present

## 2020-05-13 DIAGNOSIS — S2241XD Multiple fractures of ribs, right side, subsequent encounter for fracture with routine healing: Secondary | ICD-10-CM | POA: Diagnosis not present

## 2020-05-13 DIAGNOSIS — S065XAA Traumatic subdural hemorrhage with loss of consciousness status unknown, initial encounter: Secondary | ICD-10-CM

## 2020-05-13 DIAGNOSIS — S066X0D Traumatic subarachnoid hemorrhage without loss of consciousness, subsequent encounter: Secondary | ICD-10-CM | POA: Diagnosis not present

## 2020-05-13 DIAGNOSIS — I2510 Atherosclerotic heart disease of native coronary artery without angina pectoris: Secondary | ICD-10-CM | POA: Diagnosis not present

## 2020-05-15 ENCOUNTER — Ambulatory Visit
Admission: RE | Admit: 2020-05-15 | Discharge: 2020-05-15 | Disposition: A | Discharge status: Home / Self Care | Payer: Medicare Other | Source: Ambulatory Visit | Attending: Neurological Surgery | Admitting: Neurological Surgery

## 2020-05-15 DIAGNOSIS — G9389 Other specified disorders of brain: Secondary | ICD-10-CM | POA: Diagnosis not present

## 2020-05-15 DIAGNOSIS — S066X0A Traumatic subarachnoid hemorrhage without loss of consciousness, initial encounter: Secondary | ICD-10-CM | POA: Diagnosis not present

## 2020-05-15 DIAGNOSIS — I609 Nontraumatic subarachnoid hemorrhage, unspecified: Secondary | ICD-10-CM | POA: Diagnosis not present

## 2020-05-15 DIAGNOSIS — I6389 Other cerebral infarction: Secondary | ICD-10-CM | POA: Diagnosis not present

## 2020-05-15 DIAGNOSIS — S065XAA Traumatic subdural hemorrhage with loss of consciousness status unknown, initial encounter: Secondary | ICD-10-CM

## 2020-05-16 ENCOUNTER — Other Ambulatory Visit: Payer: Self-pay

## 2020-05-16 ENCOUNTER — Ambulatory Visit (INDEPENDENT_AMBULATORY_CARE_PROVIDER_SITE_OTHER): Payer: Medicare Other | Admitting: Family Medicine

## 2020-05-16 ENCOUNTER — Encounter: Payer: Self-pay | Admitting: Family Medicine

## 2020-05-16 ENCOUNTER — Other Ambulatory Visit: Payer: Self-pay | Admitting: Family Medicine

## 2020-05-16 VITALS — BP 102/78 | HR 84 | Temp 96.4°F | Ht 69.0 in | Wt 181.2 lb

## 2020-05-16 DIAGNOSIS — S069X0D Unspecified intracranial injury without loss of consciousness, subsequent encounter: Secondary | ICD-10-CM | POA: Diagnosis not present

## 2020-05-16 DIAGNOSIS — I482 Chronic atrial fibrillation, unspecified: Secondary | ICD-10-CM

## 2020-05-16 DIAGNOSIS — I609 Nontraumatic subarachnoid hemorrhage, unspecified: Secondary | ICD-10-CM | POA: Diagnosis not present

## 2020-05-16 LAB — CBC WITH DIFFERENTIAL/PLATELET
Basophils Absolute: 0 10*3/uL (ref 0.0–0.1)
Basophils Relative: 0.4 % (ref 0.0–3.0)
Eosinophils Absolute: 0.1 10*3/uL (ref 0.0–0.7)
Eosinophils Relative: 1.6 % (ref 0.0–5.0)
HCT: 38 % — ABNORMAL LOW (ref 39.0–52.0)
Hemoglobin: 12.5 g/dL — ABNORMAL LOW (ref 13.0–17.0)
Lymphocytes Relative: 19 % (ref 12.0–46.0)
Lymphs Abs: 1.1 10*3/uL (ref 0.7–4.0)
MCHC: 32.8 g/dL (ref 30.0–36.0)
MCV: 97.6 fl (ref 78.0–100.0)
Monocytes Absolute: 0.6 10*3/uL (ref 0.1–1.0)
Monocytes Relative: 9.7 % (ref 3.0–12.0)
Neutro Abs: 3.9 10*3/uL (ref 1.4–7.7)
Neutrophils Relative %: 69.3 % (ref 43.0–77.0)
Platelets: 185 10*3/uL (ref 150.0–400.0)
RBC: 3.9 Mil/uL — ABNORMAL LOW (ref 4.22–5.81)
RDW: 15.7 % — ABNORMAL HIGH (ref 11.5–15.5)
WBC: 5.7 10*3/uL (ref 4.0–10.5)

## 2020-05-16 LAB — COMPREHENSIVE METABOLIC PANEL
ALT: 22 U/L (ref 0–53)
AST: 22 U/L (ref 0–37)
Albumin: 3.8 g/dL (ref 3.5–5.2)
Alkaline Phosphatase: 53 U/L (ref 39–117)
BUN: 22 mg/dL (ref 6–23)
CO2: 30 mEq/L (ref 19–32)
Calcium: 9.1 mg/dL (ref 8.4–10.5)
Chloride: 97 mEq/L (ref 96–112)
Creatinine, Ser: 1.29 mg/dL (ref 0.40–1.50)
GFR: 53.31 mL/min — ABNORMAL LOW (ref >60.00)
Glucose, Bld: 105 mg/dL — ABNORMAL HIGH (ref 70–99)
Potassium: 4.3 mEq/L (ref 3.5–5.1)
Sodium: 133 mEq/L — ABNORMAL LOW (ref 135–145)
Total Bilirubin: 0.8 mg/dL (ref 0.2–1.2)
Total Protein: 6.2 g/dL (ref 6.0–8.3)

## 2020-05-16 MED ORDER — LEVETIRACETAM 500 MG PO TABS: 500.0000 mg | ORAL_TABLET | Freq: Two times a day (BID) | ORAL | 1 refills | Status: DC

## 2020-05-16 NOTE — Progress Notes (Signed)
This visit occurred during the SARS-CoV-2 public health emergency.  Safety protocols were in place, including screening questions prior to the visit, additional usage of staff PPE, and extensive cleaning of exam room while observing appropriate contact time as indicated for disinfecting solutions.  He had fallen the day prior to ER eval. the next day he had SZ like activity then ER eval. imaging at that point showed small subarachnoid hemorrhage.  He was anticoagulated at the time.  He was transferred and then admitted.  Inpatient course discussed with patient.  Anticoagulation was stopped and he was started on Keppra.  He had no subsequent seizure-like activity and was able to be discharged.  He has neurosurgery f/u pending.    Incidental rib fractures on plain films noted.  Discussed with patient.  Is not having chest wall pain.  He had follow-up CT yesterday.  Unchanged small volume subarachnoid hemorrhage over the left cerebral convexity.  He feels good.  No HA.  No other bleeding in the meantime.  No other SZ.  He wanted to cancel home health, d/w pt. he was adamant about that.  He felt well enough to go along without home health.  Meds, vitals, and allergies reviewed.   ROS: Per HPI unless specifically indicated in ROS section   GEN: nad, alert and oriented, speech normal. HEENT: ncat NECK: supple w/o LA CV: IRR, not tachy.   PULM: ctab, no inc wob, chest wall nontender. ABD: soft, +bs EXT: no edema SKIN: Well-perfused. Gait symmetric.  Current Outpatient Medications on File Prior to Visit  Medication Sig Dispense Refill  . allopurinol (ZYLOPRIM) 100 MG tablet Take 1 tablet (100 mg total) by mouth daily. 90 tablet 1  . allopurinol (ZYLOPRIM) 300 MG tablet Take 300 mg by mouth daily.    . Ascorbic Acid (VITAMIN C) 1000 MG tablet Take 1,000 mg by mouth daily.    . brimonidine (ALPHAGAN) 0.2 % ophthalmic solution Place into both eyes 2 (two) times daily.    . carvedilol (COREG)  3.125 MG tablet Take 1 tablet (3.125 mg total) by mouth 2 (two) times daily with a meal. 180 tablet 3  . furosemide (LASIX) 40 MG tablet TAKE 1/2 TABLET BY MOUTH EVERY DAY AS NEEDED. 90 tablet 1  . ipratropium (ATROVENT) 0.06 % nasal spray SPRAY 2 SPRAYS INTO EACH NOSTRIL EVERY DAY 15 mL 5  . latanoprost (XALATAN) 0.005 % ophthalmic solution Place 1 drop into both eyes at bedtime.    . simvastatin (ZOCOR) 20 MG tablet TAKE 1 TABLET(20 MG) BY MOUTH DAILY 90 tablet 1   No current facility-administered medications on file prior to visit.

## 2020-05-16 NOTE — Patient Instructions (Addendum)
We'll cancel home health and update neurosurgery.  Don't drive.  Keep taking keppra.  Go to the lab on the way out.   If you have mychart we'll likely use that to update you.    Take care.  Glad to see you.

## 2020-05-19 DIAGNOSIS — I609 Nontraumatic subarachnoid hemorrhage, unspecified: Secondary | ICD-10-CM | POA: Insufficient documentation

## 2020-05-19 NOTE — Assessment & Plan Note (Addendum)
Still present but not worse on most recent CT scan.  Discussed with patient. Unchanged small volume subarachnoid hemorrhage over the left cerebral convexity.  No seizure-like activity in the meantime.  Off anticoagulation.  Prescription written for Keppra.  Routine seizure cautions given.  Advised not to drive.  He has follow-up with neurosurgery pending.  Fortunately he is not having rib pain.  Anticoagulation has been stopped but it makes sense to continue his other medications as listed above.  At least 30 minutes were devoted to patient care in this encounter (this can potentially include time spent reviewing the patient's file/history, interviewing and examining the patient, counseling/reviewing plan with patient, ordering referrals, ordering tests, reviewing relevant laboratory or x-ray data, and documenting the encounter).  See notes on follow-up labs.

## 2020-05-19 NOTE — Assessment & Plan Note (Signed)
Off anticoagulation at this point.  Discussed with patient.

## 2020-05-23 ENCOUNTER — Telehealth: Payer: Self-pay | Admitting: Family Medicine

## 2020-05-23 DIAGNOSIS — D181 Lymphangioma, any site: Secondary | ICD-10-CM | POA: Diagnosis not present

## 2020-05-23 DIAGNOSIS — I609 Nontraumatic subarachnoid hemorrhage, unspecified: Secondary | ICD-10-CM

## 2020-05-23 DIAGNOSIS — S066X9D Traumatic subarachnoid hemorrhage with loss of consciousness of unspecified duration, subsequent encounter: Secondary | ICD-10-CM | POA: Diagnosis not present

## 2020-05-23 NOTE — Telephone Encounter (Signed)
Patient went to see Dr. Edmon Crape, about his seizure. Dr.Dawley suggested patient be referred to a neurologist.  Patient wants a neurologist in [place]. Patient can go anytime. Dr.Dawley said he feels patient can go back on Coumadin, but Dr.Dawley told patient to ask Dr. Damita Dunnings, if he should be put back on coumadin.

## 2020-05-24 NOTE — Telephone Encounter (Signed)
I put in the referral.  I am hesitant to restart coumadin yet.  I would like neurology input.  Thanks.

## 2020-05-24 NOTE — Telephone Encounter (Signed)
Patient and wife advised

## 2020-05-27 DIAGNOSIS — E291 Testicular hypofunction: Secondary | ICD-10-CM

## 2020-05-27 DIAGNOSIS — I4821 Permanent atrial fibrillation: Secondary | ICD-10-CM | POA: Diagnosis not present

## 2020-05-27 DIAGNOSIS — S2241XD Multiple fractures of ribs, right side, subsequent encounter for fracture with routine healing: Secondary | ICD-10-CM | POA: Diagnosis not present

## 2020-05-27 DIAGNOSIS — S066X0D Traumatic subarachnoid hemorrhage without loss of consciousness, subsequent encounter: Secondary | ICD-10-CM | POA: Diagnosis not present

## 2020-05-27 DIAGNOSIS — I471 Supraventricular tachycardia: Secondary | ICD-10-CM

## 2020-05-27 DIAGNOSIS — Z86010 Personal history of colonic polyps: Secondary | ICD-10-CM

## 2020-05-27 DIAGNOSIS — N529 Male erectile dysfunction, unspecified: Secondary | ICD-10-CM

## 2020-05-27 DIAGNOSIS — I420 Dilated cardiomyopathy: Secondary | ICD-10-CM

## 2020-05-27 DIAGNOSIS — I5022 Chronic systolic (congestive) heart failure: Secondary | ICD-10-CM

## 2020-05-27 DIAGNOSIS — Z9181 History of falling: Secondary | ICD-10-CM

## 2020-05-27 DIAGNOSIS — I110 Hypertensive heart disease with heart failure: Secondary | ICD-10-CM | POA: Diagnosis not present

## 2020-05-27 DIAGNOSIS — E1121 Type 2 diabetes mellitus with diabetic nephropathy: Secondary | ICD-10-CM

## 2020-05-27 DIAGNOSIS — D696 Thrombocytopenia, unspecified: Secondary | ICD-10-CM

## 2020-05-27 DIAGNOSIS — E785 Hyperlipidemia, unspecified: Secondary | ICD-10-CM

## 2020-05-27 DIAGNOSIS — I2510 Atherosclerotic heart disease of native coronary artery without angina pectoris: Secondary | ICD-10-CM | POA: Diagnosis not present

## 2020-05-27 DIAGNOSIS — Z87891 Personal history of nicotine dependence: Secondary | ICD-10-CM

## 2020-05-27 DIAGNOSIS — I0981 Rheumatic heart failure: Secondary | ICD-10-CM

## 2020-05-27 DIAGNOSIS — M109 Gout, unspecified: Secondary | ICD-10-CM

## 2020-05-27 DIAGNOSIS — I051 Rheumatic mitral insufficiency: Secondary | ICD-10-CM

## 2020-06-03 ENCOUNTER — Encounter: Payer: Self-pay | Admitting: Family Medicine

## 2020-06-03 ENCOUNTER — Other Ambulatory Visit: Payer: Self-pay

## 2020-06-03 ENCOUNTER — Ambulatory Visit (INDEPENDENT_AMBULATORY_CARE_PROVIDER_SITE_OTHER)
Admission: RE | Admit: 2020-06-03 | Discharge: 2020-06-03 | Disposition: A | Discharge status: Home / Self Care | Payer: Medicare Other | Source: Ambulatory Visit | Attending: Family Medicine | Admitting: Family Medicine

## 2020-06-03 ENCOUNTER — Ambulatory Visit (INDEPENDENT_AMBULATORY_CARE_PROVIDER_SITE_OTHER): Payer: Medicare Other | Admitting: Family Medicine

## 2020-06-03 VITALS — BP 132/80 | HR 52 | Temp 95.1°F | Ht 69.0 in | Wt 177.3 lb

## 2020-06-03 DIAGNOSIS — R0781 Pleurodynia: Secondary | ICD-10-CM | POA: Diagnosis not present

## 2020-06-03 DIAGNOSIS — R0789 Other chest pain: Secondary | ICD-10-CM | POA: Diagnosis not present

## 2020-06-03 DIAGNOSIS — S2231XA Fracture of one rib, right side, initial encounter for closed fracture: Secondary | ICD-10-CM | POA: Diagnosis not present

## 2020-06-03 NOTE — Progress Notes (Signed)
This visit occurred during the SARS-CoV-2 public health emergency.  Safety protocols were in place, including screening questions prior to the visit, additional usage of staff PPE, and extensive cleaning of exam room while observing appropriate contact time as indicated for disinfecting solutions.  He is still off anticoagulation at this point.  [place] this early yesterday AM, prior to sunrise.  Was getting up to go to BR.  He fell onto the rim of the sink.  No LOC.  Unclear if he was lightheaded, "I don't think I was."  Unclear if lost traction with slippers he was wearing at the time.  L rib pain.  Didn't hit head.  He had prev removed his rug in that in front of the sink.   He didn't wake wife, went back to bed.  Pain got worse later in the day.  L anterior rib pain.  Pain is some better today.  Taking extra strength tylenol.    He has f/u pending with neurology.    No BM in 3 days.  Usually gets relief with metamucil.  D/w pt about miralaix trial.  Walking with cane.  Declined PT and walker use.  Fall cautions discussed with patient.  Meds, vitals, and allergies reviewed.  ROS: Per HPI unless specifically indicated in ROS section   nad ncat Neck supple, no LA L anterior chest wall ttp with local bruising.  Back and right chest wall not tender. ctab IRR, not tachy.   Abdomen soft.  Nontender.  No bruising Extremities well perfused.

## 2020-06-03 NOTE — Patient Instructions (Signed)
2 tylenol up to 3 times a day for pain.  Go to the lab on the way out.   If you have mychart we'll likely use that to update you.    Change the bedroom shoes.  Practice taking deep breaths a few times day.   Let me know if worse in the meantime.  Take care.  Glad to see you.

## 2020-06-05 NOTE — Assessment & Plan Note (Signed)
Discussed rationale for checking chest x-ray.  Discussed that occult rib fractures can be present but not seen on chest x-ray.  At this point still okay for outpatient follow-up.  We talked about changing shoes and routine fall cautions.  I would not restart anticoagulation at this point given his history of falls.  Rationale discussed with patient.  He agrees with plan.  Continue Tylenol as needed for pain.  He does feel better today at time of exam than he had the day before.

## 2020-06-19 ENCOUNTER — Other Ambulatory Visit: Payer: Self-pay | Admitting: Neurology

## 2020-06-19 DIAGNOSIS — R569 Unspecified convulsions: Secondary | ICD-10-CM | POA: Diagnosis not present

## 2020-06-19 DIAGNOSIS — Z8679 Personal history of other diseases of the circulatory system: Secondary | ICD-10-CM

## 2020-06-19 DIAGNOSIS — Z9181 History of falling: Secondary | ICD-10-CM | POA: Diagnosis not present

## 2020-07-05 ENCOUNTER — Ambulatory Visit
Admission: RE | Admit: 2020-07-05 | Discharge: 2020-07-05 | Disposition: A | Discharge status: Home / Self Care | Payer: Medicare Other | Source: Ambulatory Visit | Attending: Neurology | Admitting: Neurology

## 2020-07-05 ENCOUNTER — Other Ambulatory Visit: Payer: Self-pay

## 2020-07-05 DIAGNOSIS — Z87828 Personal history of other (healed) physical injury and trauma: Secondary | ICD-10-CM | POA: Diagnosis not present

## 2020-07-05 DIAGNOSIS — D181 Lymphangioma, any site: Secondary | ICD-10-CM | POA: Diagnosis not present

## 2020-07-05 DIAGNOSIS — G9608 Other cranial cerebrospinal fluid leak: Secondary | ICD-10-CM | POA: Diagnosis not present

## 2020-07-05 DIAGNOSIS — Z8679 Personal history of other diseases of the circulatory system: Secondary | ICD-10-CM | POA: Diagnosis not present

## 2020-07-05 DIAGNOSIS — I6523 Occlusion and stenosis of bilateral carotid arteries: Secondary | ICD-10-CM | POA: Diagnosis not present

## 2020-07-05 DIAGNOSIS — I609 Nontraumatic subarachnoid hemorrhage, unspecified: Secondary | ICD-10-CM | POA: Diagnosis not present

## 2020-07-13 ENCOUNTER — Other Ambulatory Visit: Payer: Self-pay | Admitting: Family Medicine

## 2020-07-17 DIAGNOSIS — Z8679 Personal history of other diseases of the circulatory system: Secondary | ICD-10-CM | POA: Diagnosis not present

## 2020-07-17 DIAGNOSIS — R569 Unspecified convulsions: Secondary | ICD-10-CM | POA: Diagnosis not present

## 2020-07-17 DIAGNOSIS — Z9181 History of falling: Secondary | ICD-10-CM | POA: Diagnosis not present

## 2020-09-23 ENCOUNTER — Other Ambulatory Visit: Payer: Self-pay | Admitting: Family Medicine

## 2020-09-23 NOTE — Telephone Encounter (Signed)
Please clarify. Patient has two orders of this medication on his list. One is 100 mg and the other is 300 mg. Thanks.

## 2020-09-24 NOTE — Telephone Encounter (Signed)
Prescription sent.  He takes a combination of 300 and 100 mg tablets.  Needs yearly visit set up for the spring, when possible.  Thanks.

## 2020-10-11 ENCOUNTER — Other Ambulatory Visit: Payer: Self-pay | Admitting: Family Medicine

## 2020-10-11 NOTE — Telephone Encounter (Signed)
Pharmacy requests refill on: Allopurinol 100 mg   LAST REFILL: 07/13/2020 (Q-90, R-0) LAST OV: 06/03/2020  NEXT OV: Not Scheduled  PHARMACY: [facility] [place], [place] requests refill on: Carvedilol 3.125 mg   LAST REFILL: 10/04/2019 (Q-180, R-3) LAST OV: 06/03/2020 NEXT OV: Not Scheduled  PHARMACY: [facility] [place], [place]

## 2020-10-27 ENCOUNTER — Other Ambulatory Visit: Payer: Self-pay | Admitting: Family Medicine

## 2020-11-29 ENCOUNTER — Ambulatory Visit: Payer: Self-pay

## 2020-12-18 DIAGNOSIS — H401131 Primary open-angle glaucoma, bilateral, mild stage: Secondary | ICD-10-CM | POA: Diagnosis not present

## 2020-12-18 DIAGNOSIS — E119 Type 2 diabetes mellitus without complications: Secondary | ICD-10-CM | POA: Diagnosis not present

## 2020-12-18 DIAGNOSIS — Z961 Presence of intraocular lens: Secondary | ICD-10-CM | POA: Diagnosis not present

## 2020-12-18 DIAGNOSIS — H04123 Dry eye syndrome of bilateral lacrimal glands: Secondary | ICD-10-CM | POA: Diagnosis not present

## 2021-01-15 DIAGNOSIS — Z8679 Personal history of other diseases of the circulatory system: Secondary | ICD-10-CM | POA: Diagnosis not present

## 2021-01-15 DIAGNOSIS — R569 Unspecified convulsions: Secondary | ICD-10-CM | POA: Diagnosis not present

## 2021-01-15 DIAGNOSIS — Z9181 History of falling: Secondary | ICD-10-CM | POA: Diagnosis not present

## 2021-01-19 ENCOUNTER — Other Ambulatory Visit: Payer: Self-pay | Admitting: Family Medicine

## 2021-01-24 ENCOUNTER — Other Ambulatory Visit: Payer: Self-pay | Admitting: Family Medicine

## 2021-01-28 DIAGNOSIS — H57813 Brow ptosis, bilateral: Secondary | ICD-10-CM | POA: Diagnosis not present

## 2021-01-28 DIAGNOSIS — H401131 Primary open-angle glaucoma, bilateral, mild stage: Secondary | ICD-10-CM | POA: Diagnosis not present

## 2021-01-28 DIAGNOSIS — Z961 Presence of intraocular lens: Secondary | ICD-10-CM | POA: Diagnosis not present

## 2021-01-28 DIAGNOSIS — H04123 Dry eye syndrome of bilateral lacrimal glands: Secondary | ICD-10-CM | POA: Diagnosis not present

## 2021-02-06 DIAGNOSIS — H401131 Primary open-angle glaucoma, bilateral, mild stage: Secondary | ICD-10-CM | POA: Diagnosis not present

## 2021-03-25 ENCOUNTER — Other Ambulatory Visit: Payer: Self-pay

## 2021-03-25 MED ORDER — ALLOPURINOL 300 MG PO TABS: ORAL_TABLET | ORAL | 1 refills | Status: DC

## 2021-04-20 ENCOUNTER — Other Ambulatory Visit: Payer: Self-pay | Admitting: Family Medicine

## 2021-04-22 NOTE — Telephone Encounter (Signed)
Call patient scheduled [place] with labs prior

## 2021-04-22 NOTE — Telephone Encounter (Signed)
Patient is due to AWV appt; please call to schedule and send message back for medication refill.

## 2021-04-23 ENCOUNTER — Telehealth: Payer: Self-pay | Admitting: Family Medicine

## 2021-04-23 NOTE — Telephone Encounter (Signed)
Pt came into office with a form stating that his medication (Simvastatin) was not approved by prescriber. Placed in provider box

## 2021-04-23 NOTE — Telephone Encounter (Signed)
Rx was refilled yesterday afternoon. I called [facility] to verify and they do have the prescription and is ready for the patient to pick up. I notified patient of this and he will go pick up rx sometime.

## 2021-05-04 ENCOUNTER — Other Ambulatory Visit: Payer: Self-pay | Admitting: Family Medicine

## 2021-05-04 DIAGNOSIS — M109 Gout, unspecified: Secondary | ICD-10-CM

## 2021-05-04 DIAGNOSIS — E782 Mixed hyperlipidemia: Secondary | ICD-10-CM

## 2021-05-04 DIAGNOSIS — R739 Hyperglycemia, unspecified: Secondary | ICD-10-CM

## 2021-05-04 DIAGNOSIS — I482 Chronic atrial fibrillation, unspecified: Secondary | ICD-10-CM

## 2021-05-06 ENCOUNTER — Other Ambulatory Visit (INDEPENDENT_AMBULATORY_CARE_PROVIDER_SITE_OTHER): Payer: Medicare Other

## 2021-05-06 ENCOUNTER — Other Ambulatory Visit: Payer: Self-pay

## 2021-05-06 DIAGNOSIS — E782 Mixed hyperlipidemia: Secondary | ICD-10-CM

## 2021-05-06 DIAGNOSIS — R739 Hyperglycemia, unspecified: Secondary | ICD-10-CM

## 2021-05-06 DIAGNOSIS — M109 Gout, unspecified: Secondary | ICD-10-CM

## 2021-05-06 DIAGNOSIS — I482 Chronic atrial fibrillation, unspecified: Secondary | ICD-10-CM | POA: Diagnosis not present

## 2021-05-06 LAB — CBC WITH DIFFERENTIAL/PLATELET
Basophils Absolute: 0 10*3/uL (ref 0.0–0.1)
Basophils Relative: 1 % (ref 0.0–3.0)
Eosinophils Absolute: 0.1 10*3/uL (ref 0.0–0.7)
Eosinophils Relative: 2.7 % (ref 0.0–5.0)
HCT: 39.8 % (ref 39.0–52.0)
Hemoglobin: 13.1 g/dL (ref 13.0–17.0)
Lymphocytes Relative: 31.4 % (ref 12.0–46.0)
Lymphs Abs: 1.4 10*3/uL (ref 0.7–4.0)
MCHC: 33 g/dL (ref 30.0–36.0)
MCV: 97.2 fl (ref 78.0–100.0)
Monocytes Absolute: 0.4 10*3/uL (ref 0.1–1.0)
Monocytes Relative: 8.7 % (ref 3.0–12.0)
Neutro Abs: 2.4 10*3/uL (ref 1.4–7.7)
Neutrophils Relative %: 56.2 % (ref 43.0–77.0)
Platelets: 177 10*3/uL (ref 150.0–400.0)
RBC: 4.09 Mil/uL — ABNORMAL LOW (ref 4.22–5.81)
RDW: 15.3 % (ref 11.5–15.5)
WBC: 4.4 10*3/uL (ref 4.0–10.5)

## 2021-05-06 LAB — COMPREHENSIVE METABOLIC PANEL
ALT: 13 U/L (ref 0–53)
AST: 19 U/L (ref 0–37)
Albumin: 3.8 g/dL (ref 3.5–5.2)
Alkaline Phosphatase: 53 U/L (ref 39–117)
BUN: 26 mg/dL — ABNORMAL HIGH (ref 6–23)
CO2: 29 mEq/L (ref 19–32)
Calcium: 9.1 mg/dL (ref 8.4–10.5)
Chloride: 97 mEq/L (ref 96–112)
Creatinine, Ser: 1.6 mg/dL — ABNORMAL HIGH (ref 0.40–1.50)
GFR: 39.72 mL/min — ABNORMAL LOW (ref >60.00)
Glucose, Bld: 94 mg/dL (ref 70–99)
Potassium: 4.8 mEq/L (ref 3.5–5.1)
Sodium: 135 mEq/L (ref 135–145)
Total Bilirubin: 0.7 mg/dL (ref 0.2–1.2)
Total Protein: 5.8 g/dL — ABNORMAL LOW (ref 6.0–8.3)

## 2021-05-06 LAB — LIPID PANEL
Cholesterol: 109 mg/dL (ref 0–200)
HDL: 53.7 mg/dL (ref >39.00)
LDL Cholesterol: 35 mg/dL (ref 0–99)
NonHDL: 54.94
Total CHOL/HDL Ratio: 2
Triglycerides: 98 mg/dL (ref 0.0–149.0)
VLDL: 19.6 mg/dL (ref 0.0–40.0)

## 2021-05-06 LAB — HEMOGLOBIN A1C: Hgb A1c MFr Bld: 5.6 % (ref 4.6–6.5)

## 2021-05-06 LAB — URIC ACID: Uric Acid, Serum: 3 mg/dL — ABNORMAL LOW (ref 4.0–7.8)

## 2021-05-06 LAB — TSH: TSH: 3.64 u[IU]/mL (ref 0.35–5.50)

## 2021-05-07 ENCOUNTER — Other Ambulatory Visit: Payer: Self-pay | Admitting: Family Medicine

## 2021-05-07 DIAGNOSIS — R7989 Other specified abnormal findings of blood chemistry: Secondary | ICD-10-CM

## 2021-05-13 ENCOUNTER — Encounter: Payer: Medicare Other | Admitting: Family Medicine

## 2021-05-22 ENCOUNTER — Ambulatory Visit (INDEPENDENT_AMBULATORY_CARE_PROVIDER_SITE_OTHER): Payer: Medicare Other | Admitting: Family Medicine

## 2021-05-22 ENCOUNTER — Other Ambulatory Visit: Payer: Self-pay

## 2021-05-22 ENCOUNTER — Encounter: Payer: Self-pay | Admitting: Family Medicine

## 2021-05-22 VITALS — BP 132/84 | HR 79 | Temp 97.1°F | Ht 69.0 in | Wt 159.0 lb

## 2021-05-22 DIAGNOSIS — R7989 Other specified abnormal findings of blood chemistry: Secondary | ICD-10-CM

## 2021-05-22 DIAGNOSIS — Z0000 Encounter for general adult medical examination without abnormal findings: Secondary | ICD-10-CM

## 2021-05-22 DIAGNOSIS — I10 Essential (primary) hypertension: Secondary | ICD-10-CM

## 2021-05-22 DIAGNOSIS — Z7189 Other specified counseling: Secondary | ICD-10-CM

## 2021-05-22 DIAGNOSIS — R062 Wheezing: Secondary | ICD-10-CM

## 2021-05-22 DIAGNOSIS — H9190 Unspecified hearing loss, unspecified ear: Secondary | ICD-10-CM

## 2021-05-22 DIAGNOSIS — E782 Mixed hyperlipidemia: Secondary | ICD-10-CM

## 2021-05-22 DIAGNOSIS — M109 Gout, unspecified: Secondary | ICD-10-CM | POA: Diagnosis not present

## 2021-05-22 LAB — BASIC METABOLIC PANEL
BUN: 25 mg/dL — ABNORMAL HIGH (ref 6–23)
CO2: 31 mEq/L (ref 19–32)
Calcium: 9.4 mg/dL (ref 8.4–10.5)
Chloride: 94 mEq/L — ABNORMAL LOW (ref 96–112)
Creatinine, Ser: 1.55 mg/dL — ABNORMAL HIGH (ref 0.40–1.50)
GFR: 41.25 mL/min — ABNORMAL LOW (ref >60.00)
Glucose, Bld: 145 mg/dL — ABNORMAL HIGH (ref 70–99)
Potassium: 4.3 mEq/L (ref 3.5–5.1)
Sodium: 132 mEq/L — ABNORMAL LOW (ref 135–145)

## 2021-05-22 MED ORDER — SIMVASTATIN 20 MG PO TABS: ORAL_TABLET | ORAL | 3 refills | Status: DC

## 2021-05-22 MED ORDER — BUDESONIDE-FORMOTEROL FUMARATE 80-4.5 MCG/ACT IN AERO: 2.0000 | INHALATION_SPRAY | Freq: Two times a day (BID) | RESPIRATORY_TRACT | 12 refills | Status: DC

## 2021-05-22 MED ORDER — CARVEDILOL 3.125 MG PO TABS: ORAL_TABLET | ORAL | 3 refills | Status: DC

## 2021-05-22 NOTE — Patient Instructions (Addendum)
Go to the lab on the way out.   If you have mychart we'll likely use that to update you.     Please check to see if you are still taking keppra per Dr. Melrose Nakayama with neurology.    Try adding on symbicort- rinse after use- and see if that helps with wheeze.   Take care.  Glad to see you.

## 2021-05-22 NOTE — Progress Notes (Signed)
This visit occurred during the SARS-CoV-2 public health emergency.  Safety protocols were in place, including screening questions prior to the visit, additional usage of staff PPE, and extensive cleaning of exam room while observing appropriate contact time as indicated for disinfecting solutions.  I have personally reviewed the Medicare Annual Wellness questionnaire and have noted 1. The patient's medical and social history 2. Their use of alcohol, tobacco or illicit drugs 3. Their current medications and supplements 4. The patient's functional ability including ADL's, fall risks, home safety risks and hearing or visual             impairment. 5. Diet and physical activities 6. Evidence for depression or mood disorders  The patients weight, height, BMI have been recorded in the chart and visual acuity is per eye clinic.  I have made referrals, counseling and provided education to the patient based review of the above and I have provided the pt with a written personalized care plan for preventive services.  Provider list updated- see scanned forms.  Routine anticipatory guidance given to patient.  See health maintenance. The possibility exists that previously documented standard health maintenance information may have been brought forward from a previous encounter into this note.  If needed, that same information has been updated to reflect the current situation based on today's encounter.    Flu to be done fall 2022 Shingles prev done PNA up to date.  Tetanus 2018 Covid vaccine up to date.   Colon cancer screening not due given his age. Prostate cancer screening not due given his age Advance directive-wife designated if patient were incapacitated Cognitive function addressed- see scanned forms- and if abnormal then additional documentation follows.   In addition to [facility], follow up visit for the below conditions:  He was unsure about keppra dosing, see avs.  He is still  walking with cane.  He is off coumadin.  I asked him to update me about his Keppra dosing at home.  He has chronic shoulder and elbow pain at baseline.  Taking tylenol for pain.    Gout.  Still on allopurinol at baseline.  Uric acid slightly low but stable.  No gout flares.  Compliant with allopurinol.  Labs discussed with patient.  Hypertension:    Using medication without problems or lightheadedness: yes Chest pain with exertion: no Edema: taking lasix 1/2 tab 3x per week and that controls edema.   Short of breath:no Mild inc in Cr, d/w pt.  Recheck pending.  Avoid nsaids, d/w pt. see notes on follow-up labs.  Elevated Cholesterol: Using medications without problems:yes Muscle aches: some occ cramping at night in the L calf but not having claudication.   Diet compliance: encouraged.   Exercise: encouraged  PMH and SH reviewed  Meds, vitals, and allergies reviewed.   ROS: Per HPI.  Unless specifically indicated otherwise in HPI, the patient denies:  General: fever. Eyes: acute vision changes ENT: sore throat Cardiovascular: chest pain Respiratory: SOB GI: vomiting GU: dysuria Musculoskeletal: acute back pain Derm: acute rash Neuro: acute motor dysfunction Psych: worsening mood Endocrine: polydipsia Heme: bleeding Allergy: hayfever  GEN: nad, alert and oriented HEENT: ncat NECK: supple w/o LA CV: IRR PULM: ctab except for scant exp wheeze, no inc wob ABD: soft, +bs EXT: no edema SKIN: no acute rash Varicose veins note on the B calf

## 2021-05-26 ENCOUNTER — Other Ambulatory Visit: Payer: Self-pay | Admitting: Family Medicine

## 2021-05-26 DIAGNOSIS — M109 Gout, unspecified: Secondary | ICD-10-CM

## 2021-05-26 NOTE — Assessment & Plan Note (Signed)
He is not having claudication.  He has some occasional cramping in the left calf at rest but no exertional symptoms.  Would still continue simvastatin.

## 2021-05-26 NOTE — Assessment & Plan Note (Signed)
Flu to be done fall 2022 Shingles prev done PNA up to date.  Tetanus 2018 Covid vaccine up to date.   Colon cancer screening not due given his age. Prostate cancer screening not due given his age Advance directive-wife designated if patient were incapacitated Cognitive function addressed- see scanned forms- and if abnormal then additional documentation follows.

## 2021-05-26 NOTE — Assessment & Plan Note (Signed)
No recent flares.  See notes on follow-up creatinine.

## 2021-05-26 NOTE — Assessment & Plan Note (Signed)
No change in medications at this point.  Recheck bmet pending.  See notes on labs.  Continue carvedilol along with Lasix half tab 3 times per week.

## 2021-05-26 NOTE — Assessment & Plan Note (Signed)
Advance directive- wife designated if patient were incapacitated.  

## 2021-05-26 NOTE — Assessment & Plan Note (Signed)
Scant expiratory wheeze and given his history of A. fib I would defer beta agonist/albuterol at this point.

## 2021-06-22 ENCOUNTER — Other Ambulatory Visit: Payer: Self-pay | Admitting: Family Medicine

## 2021-07-02 ENCOUNTER — Telehealth: Payer: Self-pay | Admitting: Family Medicine

## 2021-07-02 NOTE — Chronic Care Management (AMB) (Signed)
  Chronic Care Management   Note  07/02/2021 Name: Custer Pimenta MRN: [id number] DOB: Mar 15, 1936  Abrahm Mancia is a 85 y.o. year old male who is a primary care patient of Tonia Ghent, MD. I reached out to [name] by phone today in response to a referral sent by Mr. Reeves Dam PCP, Tonia Ghent, MD.   Mr. Willcutt was given information about Chronic Care Management services today including:  CCM service includes personalized support from designated clinical staff supervised by his physician, including individualized plan of care and coordination with other care providers 24/7 contact phone numbers for assistance for urgent and routine care needs. Service will only be billed when office clinical staff spend 20 minutes or more in a month to coordinate care. Only one practitioner may furnish and bill the service in a calendar month. The patient may stop CCM services at any time (effective at the end of the month) by phone call to the office staff.   Patient agreed to services and verbal consent obtained.   Follow up plan:   [place]ana Secretary/administrator

## 2021-07-08 ENCOUNTER — Other Ambulatory Visit: Payer: Self-pay | Admitting: Family Medicine

## 2021-07-21 ENCOUNTER — Other Ambulatory Visit: Payer: Self-pay | Admitting: Family Medicine

## 2021-08-25 ENCOUNTER — Telehealth: Payer: Self-pay

## 2021-08-25 NOTE — Chronic Care Management (AMB) (Signed)
Chronic Care Management Pharmacy Assistant   Name: Benjamin Soto  MRN: [id number] DOB: 19-May-1936  Benjamin Soto is an 85 y.o. year old male who presents for his initial CCM visit with the clinical pharmacist.  Reason for Encounter: Initial Questions   Conditions to be addressed/monitored: CAD, HTN, and HLD   Recent office visits:  05/22/21-PCP-Benjamin Damita Dunnings MD-Patient presented for AWV.Discussed vaccines (up to date) discussed screenings, labs ordered,start Budesonide-Formoterol Fumarate 80-4.5 mcg/act 2 puffs 2 times daily for wheezing, rinse after use. Referral to Audiology.  Recent consult visits:  None in the past 6 months  Hospital visits:  None in previous 6 months  Medications: Outpatient Encounter Medications as of 08/25/2021  Medication Sig   allopurinol (ZYLOPRIM) 300 MG tablet TAKE 1 TABLET(300 MG) BY MOUTH DAILY   Ascorbic Acid (VITAMIN C) 1000 MG tablet Take 1,000 mg by mouth daily.   brimonidine (ALPHAGAN) 0.2 % ophthalmic solution Place into both eyes 2 (two) times daily.   budesonide-formoterol (SYMBICORT) 80-4.5 MCG/ACT inhaler Inhale 2 puffs into the lungs 2 (two) times daily. Rinse after use   carvedilol (COREG) 3.125 MG tablet TAKE 1 TABLET(3.125 MG) BY MOUTH TWICE DAILY WITH A MEAL   furosemide (LASIX) 40 MG tablet TAKE 1/2 TABLET BY MOUTH EVERY DAY AS NEEDED   ipratropium (ATROVENT) 0.06 % nasal spray USE 2 SPRAYS IN EACH NOSTRIL EVERY DAY   latanoprost (XALATAN) 0.005 % ophthalmic solution Place 1 drop into both eyes at bedtime.   simvastatin (ZOCOR) 20 MG tablet TAKE 1 TABLET(20 MG) BY MOUTH DAILY   No facility-administered encounter medications on file as of 08/25/2021.    Lab Results  Component Value Date/Time   HGBA1C 5.6 05/06/2021 08:10 AM   HGBA1C 6.0 05/25/2019 08:29 AM   MICROALBUR 95.8 Repeated and verified X2. (H) 07/20/2011 10:34 AM   MICROALBUR 115.5 (H) 01/20/2011 08:15 AM     BP Readings from Last 3 Encounters:  05/22/21 132/84   06/03/20 132/80  05/16/20 102/78    Patient contacted to review initial questions prior to visit with Charlene Brooke. The patient is extremely hearing impaired.I reminded him to have his hearing aids in for the call.  Have you seen any other providers since your last visit with PCP? No  Any changes in your medications or health? No  Any side effects from any medications? No  Do you have an symptoms or problems not managed by your medications? No  Any concerns about your health right now? Yes The patient reports he is having ankle swelling. The patient states Dr.Duncan took him off allopurinol and the patient now has ankle swelling.  Has your provider asked that you check blood pressure, blood sugar, or follow special diet at home? Yes The patient reports he will take his BP on occasion   Do you get any type of exercise on a regular basis? No The patient reports a handicap with walking. States he has bursitis on one side and arthritis on the other side.  Can you think of a goal you would like to reach for your health? No  Do you have any problems getting your medications? No   Is there anything that you would like to discuss during the appointment? No   Spoke with patient and reminded them to have all medications, supplements and any blood glucose and blood pressure readings available for review with pharmacist, at their telephone visit on 08/27/21 at 9:00am.   Star Rating Drugs:  Medication:  Last Fill: Day Supply  Simvastatin 20mg  07/06/21 90   Care Gaps: Annual wellness visit in last year? Yes Most Recent BP reading:132/84  79-P  05/22/21  Marjo Bicker, CPP notified  Avel Sensor, [place] Assistant [phone]  Total time spent for month CPA: 40 min.

## 2021-08-27 ENCOUNTER — Ambulatory Visit: Payer: Medicare Other | Admitting: Pharmacist

## 2021-08-27 ENCOUNTER — Other Ambulatory Visit: Payer: Self-pay

## 2021-08-27 DIAGNOSIS — M109 Gout, unspecified: Secondary | ICD-10-CM

## 2021-08-27 DIAGNOSIS — I10 Essential (primary) hypertension: Secondary | ICD-10-CM

## 2021-08-27 DIAGNOSIS — I2510 Atherosclerotic heart disease of native coronary artery without angina pectoris: Secondary | ICD-10-CM

## 2021-08-27 DIAGNOSIS — I5022 Chronic systolic (congestive) heart failure: Secondary | ICD-10-CM

## 2021-08-27 DIAGNOSIS — Z748 Other problems related to care provider dependency: Secondary | ICD-10-CM

## 2021-08-27 DIAGNOSIS — I482 Chronic atrial fibrillation, unspecified: Secondary | ICD-10-CM

## 2021-08-27 DIAGNOSIS — E782 Mixed hyperlipidemia: Secondary | ICD-10-CM

## 2021-08-27 NOTE — Progress Notes (Signed)
Chronic Care Management Pharmacy Note  08/31/2021 Name:  Benjamin Soto MRN:  [id number] DOB:  Aug 21, 1936  Summary: -Patient's wife passed away 07/09/2021 from melanoma; he is not comfortable driving by himself outside of local area (eg, cannot drive to [place]) -Pt reports increased swelling in ankles/feet over past couple months, he thought it was due to reduced allopurinol dose; discussed likely causes including high sodium diet; he is taking furosemide 20 mg daily; he was meant to have f/u BMP and uric acid in November but did not come in; would avoid increasing furosemide without repeat BMP  Recommendations/Changes made from today's visit: -Advised pt to limit salt in diet -Advised f/u with PCP to address LE swelling; pt is not comfortable driving to [place], will refer to Care Guide for help with transportation  Plan: -[place] will call patient in 1 week to check on swelling/transportation issues -Pharmacist follow up televisit scheduled for 2 months    Subjective: Benjamin Soto is an 85 y.o. year old male who is a primary patient of Damita Dunnings, Elveria Rising, MD.  The CCM team was consulted for assistance with disease management and care coordination needs.    Engaged with patient by telephone for initial visit in response to provider referral for pharmacy case management and/or care coordination services.   Consent to Services:  The patient was given the following information about Chronic Care Management services today, agreed to services, and gave verbal consent: 1. CCM service includes personalized support from designated clinical staff supervised by the primary care provider, including individualized plan of care and coordination with other care providers 2. 24/7 contact phone numbers for assistance for urgent and routine care needs. 3. Service will only be billed when office clinical staff spend 20 minutes or more in a month to coordinate care. 4. Only one practitioner may  furnish and bill the service in a calendar month. 5.The patient may stop CCM services at any time (effective at the end of the month) by phone call to the office staff. 6. The patient will be responsible for cost sharing (co-pay) of up to 20% of the service fee (after annual deductible is met). Patient agreed to services and consent obtained.  Patient Care Team: Tonia Ghent, MD as PCP - General (Family Medicine) Minna Merritts, MD as Consulting Physician (Cardiology) Darleen Crocker, MD as Consulting Physician (Ophthalmology) Dawley, Theodoro Doing, DO as Consulting Physician Charlton Haws, [facility] as Pharmacist (Pharmacist)  Patient lives alone. His wife recently passed 2021/07/09 from melanoma complications. Patient drives himself around locally but is not comfortable driving out of town. His son stays with him at night but is out of the house 5am-9pm.   Recent office visits: 05/22/21-PCP-[place] Damita Dunnings MD-Patient presented for AWV.Discussed vaccines (up to date) discussed screenings, labs ordered,start Budesonide-Formoterol Fumarate 80-4.5 mcg/act. Referral to Audiology. Kidney fxn minimally improved, reduce allopurinol to 300 mg and re-check uric acid and BMP in 2 months.  Recent consult visits: None  Hospital visits: None in previous 6 months   Objective:  Lab Results  Component Value Date   CREATININE 1.55 (H) 05/22/2021   BUN 25 (H) 05/22/2021   GFR 41.25 (L) 05/22/2021   GFRNONAA 43 (L) 05/08/2020   GFRAA 50 (L) 05/08/2020   NA 132 (L) 05/22/2021   K 4.3 05/22/2021   CALCIUM 9.4 05/22/2021   CO2 31 05/22/2021   GLUCOSE 145 (H) 05/22/2021    Lab Results  Component Value Date/Time   HGBA1C 5.6 05/06/2021 08:10 AM  HGBA1C 6.0 05/25/2019 08:29 AM   GFR 41.25 (L) 05/22/2021 11:01 AM   GFR 39.72 (L) 05/06/2021 08:10 AM   MICROALBUR 95.8 Repeated and verified X2. (H) 07/20/2011 10:34 AM   MICROALBUR 115.5 (H) 01/20/2011 08:15 AM    Last diabetic Eye exam:  Lab Results   Component Value Date/Time   HMDIABEYEEXA normal 03/05/2009 12:00 AM    Last diabetic Foot exam:  Lab Results  Component Value Date/Time   HMDIABFOOTEX done 07/24/2010 12:00 AM     Lab Results  Component Value Date   CHOL 109 05/06/2021   HDL 53.70 05/06/2021   LDLCALC 35 05/06/2021   TRIG 98.0 05/06/2021   CHOLHDL 2 05/06/2021    Hepatic Function Latest Ref Rng & Units 05/06/2021 05/16/2020 05/08/2020  Total Protein 6.0 - 8.3 g/dL 5.8(L) 6.2 6.6  Albumin 3.5 - 5.2 g/dL 3.8 3.8 4.1  AST 0 - 37 U/L 19 22 27   ALT 0 - 53 U/L 13 22 26   Alk Phosphatase 39 - 117 U/L 53 53 54  Total Bilirubin 0.2 - 1.2 mg/dL 0.7 0.8 1.2  Bilirubin, Direct 0.0 - 0.3 mg/dL - - -    Lab Results  Component Value Date/Time   TSH 3.64 05/06/2021 08:10 AM   TSH 3.22 05/25/2019 08:29 AM    CBC Latest Ref Rng & Units 05/06/2021 05/16/2020 05/08/2020  WBC 4.0 - 10.5 K/uL 4.4 5.7 7.3  Hemoglobin 13.0 - 17.0 g/dL 13.1 12.5(L) 13.2  Hematocrit 39.0 - 52.0 % 39.8 38.0(L) 39.1  Platelets 150.0 - 400.0 K/uL 177.0 185.0 154    Lab Results  Component Value Date/Time   VD25OH 46 06/21/2009 09:25 PM    Clinical ASCVD: Yes  The ASCVD Risk score (Arnett DK, et al., 2019) failed to calculate for the following reasons:   The 2019 ASCVD risk score is only valid for ages 18 to 37   The patient has a prior MI or stroke diagnosis    Depression screen [facility] 2/9 05/22/2021 02/01/2018 05/05/2016  Decreased Interest 0 0 0  Down, Depressed, Hopeless 0 0 0  PHQ - 2 Score 0 0 0  Some recent data might be hidden     CHA2DS2-VASc Score = 7  The patient's score is based upon: CHF History: 1 HTN History: 1 Diabetes History: 0 Stroke History: 2 Vascular Disease History: 1 Age Score: 2 Gender Score: 0      Social History   Tobacco Use  Smoking Status Former   Years: 44.00   Types: Cigarettes, Pipe  Smokeless Tobacco Never  Tobacco Comments   Down to 1 cigarette per day as of 2022 (not 1 pack)   BP Readings from  Last 3 Encounters:  05/22/21 132/84  06/03/20 132/80  05/16/20 102/78   Pulse Readings from Last 3 Encounters:  05/22/21 79  06/03/20 (!) 52  05/16/20 84   Wt Readings from Last 3 Encounters:  05/22/21 159 lb (72.1 kg)  06/03/20 177 lb 5 oz (80.4 kg)  05/16/20 181 lb 4 oz (82.2 kg)   BMI Readings from Last 3 Encounters:  05/22/21 23.48 kg/m  06/03/20 26.18 kg/m  05/16/20 26.77 kg/m    Assessment/Interventions: Review of patient past medical history, allergies, medications, health status, including review of consultants reports, laboratory and other test data, was performed as part of comprehensive evaluation and provision of chronic care management services.   SDOH:  (Social Determinants of Health) assessments and interventions performed: Yes  SDOH Screenings   Alcohol Screen: Not  on file  Depression (PHQ2-9): Low Risk    PHQ-2 Score: 0  Financial Resource Strain: Not on file  Food Insecurity: Not on file  Housing: Not on file  Physical Activity: Not on file  Social Connections: Not on file  Stress: Not on file  Tobacco Use: Medium Risk   Smoking Tobacco Use: Former   Smokeless Tobacco Use: Never   Passive Exposure: Not on file  Transportation Needs: Not on file    CCM Care Plan  Allergies  Allergen Reactions   Lisinopril     REACTION: hyperkalemia at high dose   Tramadol     constipation   Varenicline Tartrate     REACTION: vivid dreams, nausea    Medications Reviewed Today     Reviewed by Charlton Haws, [facility] (Pharmacist) on 08/27/21 at 0931  Med List Status: <None>   Medication Order Taking? Sig Documenting Provider Last Dose Status Informant  acetaminophen (TYLENOL) 500 MG tablet [id number] Yes Take 500 mg by mouth every 6 (six) hours as needed. [provider] Taking Active   allopurinol (ZYLOPRIM) 300 MG tablet [id number] Yes TAKE 1 TABLET(300 MG) BY MOUTH DAILY Tonia Ghent, MD Taking Active   Ascorbic Acid (VITAMIN C) 1000 MG  tablet [id number] Yes Take 1,000 mg by mouth daily. [provider] Taking Active Spouse/Significant Other  aspirin EC 81 MG tablet [id number] Yes Take 81 mg by mouth daily. Swallow whole. [provider] Taking Active   bisacodyl (DULCOLAX) 5 MG EC tablet [id number] Yes Take 5 mg by mouth daily as needed for moderate constipation. [provider] Taking Active   brimonidine (ALPHAGAN) 0.2 % ophthalmic solution [id number] Yes Place into both eyes 2 (two) times daily. [provider] Taking Active Spouse/Significant Other  carvedilol (COREG) 3.125 MG tablet [id number] Yes TAKE 1 TABLET(3.125 MG) BY MOUTH TWICE DAILY WITH A MEAL Tonia Ghent, MD Taking Active   Cholecalciferol (VITAMIN D) 50 MCG (2000 UT) CAPS [id number] Yes Take 2,000 Units by mouth daily at 2 PM. [provider] Taking Active   furosemide (LASIX) 40 MG tablet [id number] Yes TAKE 1/2 TABLET BY MOUTH EVERY DAY AS NEEDED  Patient taking differently: Take 20 mg by mouth 3 (three) times a week. TAKE 1/2 TABLET BY MOUTH EVERY DAY AS NEEDED.   Tonia Ghent, MD Taking Active   ipratropium (ATROVENT) 0.06 % nasal spray [id number] Yes USE 2 SPRAYS IN [facility] NOSTRIL EVERY DAY Tonia Ghent, MD Taking Active   latanoprost (XALATAN) 0.005 % ophthalmic solution [id number] Yes Place 1 drop into both eyes at bedtime. [provider] Taking Active Spouse/Significant Other  Omega-3 Fatty Acids (FISH OIL) 1200 MG CAPS [id number] Yes Take 1,200 mg by mouth daily. [provider] Taking Active   saw palmetto 80 MG capsule [id number] Yes Take 80 mg by mouth 2 (two) times daily. [provider] Taking Active   simvastatin (ZOCOR) 20 MG tablet [id number] Yes TAKE 1 TABLET(20 MG) BY MOUTH DAILY Tonia Ghent, MD Taking Active   Med List Note Stana Bunting, [place] 06/12/13 1424):              Patient Active Problem List   Diagnosis Date Noted   Subarachnoid hemorrhage ([place])  05/19/2020   TBI (traumatic brain injury) 05/08/2020   Bruising 06/17/2018   HTN (hypertension) 02/02/2018   Fall at home 02/02/2018   Skin tear of elbow without complication 67/34/1937   Leg wound, left, sequela 02/06/2017   Advance care  planning 09/28/2014   Thrombocytopenia ([place]) 09/13/2013   GI bleed 09/12/2013   Transient amnesia 08/13/2013   Congestive dilated cardiomyopathy ([place]) 06/30/2013   CAD (coronary artery disease) 06/09/2013   Orthostatic hypotension 05/31/2013   Medicare annual wellness visit, subsequent 88/32/5498   Systolic CHF, chronic ([place]) 10/22/2011   Wheeze 10/07/2011   Hematuria, microscopic 08/17/2011   Erectile dysfunction 02/09/2011   Left-sided chest wall pain 12/15/2010   RENAL INSUFFICIENCY 02/18/2010   ROSACEA 02/18/2010   FOOT PAIN, BILATERAL 02/28/2009   DRY EYE SYNDROME 01/09/2008   Paroxysmal SVT (supraventricular tachycardia) ([place]) 01/11/2007   Gout 04/22/2003   Atrial fibrillation, chronic ([place]) 01/20/2003   Hyperlipidemia 05/22/2001   History of diabetes mellitus 09/21/1992    Immunization History  Administered Date(s) Administered   Fluad Quad(high Dose 65+) 06/01/2019, 07/22/2020   Influenza Split 07/07/2011, 06/16/2012   Influenza Whole 06/21/2006, 06/23/2007, 06/24/2007, 06/20/2008, 06/18/2009, 06/26/2010   Influenza,inj,Quad PF,6+ Mos 05/31/2013, 05/31/2014, 06/07/2015, 06/10/2016, 07/01/2017, 06/16/2018   PFIZER(Purple Top)SARS-COV-2 Vaccination 10/30/2019, 11/22/2019, 07/22/2020   Pneumococcal Conjugate-13 09/27/2014   Pneumococcal Polysaccharide-23 07/17/2009   Td 02/20/2003   Tdap 02/05/2017   Zoster, Live 07/31/2011    Conditions to be addressed/monitored:  Hypertension, Hyperlipidemia, Atrial Fibrillation, Heart Failure, Coronary Artery Disease, Chronic Kidney Disease, and Gout  Care Plan : [place]  Updates made by Charlton Haws, [place] since 08/31/2021 12:00 AM     Problem: Hypertension,  Hyperlipidemia, Atrial Fibrillation, Heart Failure, Coronary Artery Disease, Chronic Kidney Disease, and Gout   Priority: High     Long-Range Goal: Disease mgmt   Start Date: 08/31/2021  Expected End Date: 08/31/2022  This Visit's Progress: On track  Priority: High  Note:   Current Barriers:  Unable to independently monitor therapeutic efficacy Does not contact provider office for questions/concerns  Pharmacist Clinical Goal(s):  Patient will achieve adherence to monitoring guidelines and medication adherence to achieve therapeutic efficacy contact provider office for questions/concerns as evidenced notation of same in electronic health record through collaboration with PharmD and provider.   Interventions: 1:1 collaboration with Tonia Ghent, MD regarding development and update of comprehensive plan of care as evidenced by provider attestation and co-signature Inter-disciplinary care team collaboration (see longitudinal plan of care) Comprehensive medication review performed; medication list updated in electronic medical record  Hyperlipidemia: (LDL goal < 70) -Controlled - LDL is at goal; pt endorses compliance with simvastatin; he denies significant bleeding issues with aspirin -Hx moderate CAD (cath 06/2013) -Current treatment: Simvastatin 20 mg daily AM Aspirin 81 mg daily Fish oil 1200 mg daily -Educated on Cholesterol goals; Benefits of statin for ASCVD risk reduction; -Discussed benefits of aspirin given hx of CAD -Recommended to continue current medication  Atrial Fibrillation (Goal: prevent stroke and major bleeding) -Controlled - pt does not endorse palpitations, tachycardia -CHADSVASC: 7; hx of GI bleed (2014) and subarachnoid hemorrhage (04/2020), decision made to stay off anticoagulation per neurology; he has not followed up with cardiology since 02/2020 -Current treatment: Rate control: Carvedilol 3.125 mg BID Anticoagulation: none; on aspirin 81  mg -Medications previously tried: warfarin, Eliquis -Home BP and HR readings: "good" - not checking often  -Counseled on neurologist's decision to stay off of anticoagulation given SAH;  -Recommended to continue current medication  Heart Failure (Goal: manage symptoms and prevent exacerbations) -Not ideally controlled - pt reports worsening ankle swelling over past couple months, he had attributed this to reduced dose of allopurinol; pt reports he usually takes furosemide 1/2 tab 3x a week  due to frequent urination; -Last ejection fraction: 20-25% (Date: 03/2014) -HF type: Systolic -NYHA Class: I-II -Follows with Dr Rockey Situ, last appt 02/2020 -Current treatment: Carvedilol 3.125 mg BID Furosemide 40 mg - 1/2 tab daily PRN -Medications previously tried: digoxin, lisinopril, losartan, metoprolol, verapamil  -Current dietary habits: pt denies adding "spices" including salt to food; his wife recently passed and transition to cooking/feeding himself has been difficult -Current exercise habits: limited -Educated on fluid buildup due to heart failure; allopurinol dose change likely unrelated to fluid problems; discussed impact of salt and fluid intake;  -Recommended f/u appt with PCP to address worsening swelling; pt cannot drive to [place] by himself, will work with [place] to help with transportation and then get PCP appt scheduled  Gout (Goal: prevent flares) -Controlled - pt reports compliance with reduced dose of allopurinol; he assumed recent fluid issues were related to this dose change (see above) -Current treatment  Allopurinol 300 mg daily  -Discussed allopurinol has no effect on fluid/swelling; dose reduction would not have led to increase in swelling -Recommended to continue current medication  Glaucoma (Goal: maintain IOP in goal range) -Controlled -Dr Talbert [place] appt overdue; pt has been slow to reschedule missed appts after his wife's death -Current treatment  Brimonidine  0.2% eye drops BID Latanoprost 0.005% eye drops HS -Advised pt to reschedule missed appt -Recommended to continue current medication  Health Maintenance -Vaccine gaps: Flu, covid booster, Shingrix -Hx [facility] 04/2020 resulting in seizure; currently off Keppra (neurology aware) -hard of hearing -Current therapy:  Symbicort 80-4.5 mcg/act PRN wheezing - not picked up $$$ Ipratropium 0.06% nasal spray Vitamin C 1000 mg Tylenol 500 mg - 2 tab AM Saw Palmetto  Vitamin D 50 mg daily Dulcolax 5 mg PRN -Recommended to continue current medication  Patient Goals/Self-Care Activities Patient will:  - take medications as prescribed as evidenced by patient report and record review focus on medication adherence by routine check blood pressure daily, document, and provide at future appointments engage in dietary modifications by limiting sodium       Medication Assistance: None required.  Patient affirms current coverage meets needs.  Compliance/Adherence/Medication fill history: Care Gaps: None  Star-Rating Drugs: Simvastatin - LF 07/06/21 x 90 ds; [place] 100%  Patient's preferred pharmacy is:  [facility] [place], [place] - [place] [address] [place] [address] Phone: [phone] Fax: [phone]  Uses pill box? No - prefers bottles Pt endorses 100% compliance  We discussed: Current pharmacy is preferred with insurance plan and patient is satisfied with pharmacy services Patient decided to: Continue current medication management strategy  Care Plan and Follow Up Patient Decision:  Patient agrees to Care Plan and Follow-up.  Plan: Telephone follow up appointment with care management team member scheduled for:  2 months  Charlene Brooke, PharmD, [facility] Clinical Pharmacist [facility] [phone]

## 2021-08-27 NOTE — Patient Instructions (Addendum)
Visit Information  Phone number for Pharmacist: [phone]  Thank you for meeting with me to discuss your medications! I look forward to working with you to achieve your health care goals. Below is a summary of what we talked about during the visit:   Goals Addressed             This Visit's Progress    Track and Manage Fluids and Swelling-Heart Failure       Timeframe:  Long-Range Goal Priority:  High Start Date:    08/28/21                         Expected End Date: 08/28/22                       Follow Up Date Feb 2023   - call office if I gain more than 2 pounds in one day or 5 pounds in one week - keep legs up while sitting - use salt in moderation - watch for swelling in feet, ankles and legs every day    Why is this important?   It is important to check your weight daily and watch how much salt and liquids you have.  It will help you to manage your heart failure.    Notes:         Care Plan : [place]  Updates made by Charlton Haws, RPH since 08/31/2021 12:00 AM     Problem: Hypertension, Hyperlipidemia, Atrial Fibrillation, Heart Failure, Coronary Artery Disease, Chronic Kidney Disease, and Gout   Priority: High     Long-Range Goal: Disease mgmt   Start Date: 08/31/2021  Expected End Date: 08/31/2022  This Visit's Progress: On track  Priority: High  Note:   Current Barriers:  Unable to independently monitor therapeutic efficacy Does not contact provider office for questions/concerns  Pharmacist Clinical Goal(s):  Patient will achieve adherence to monitoring guidelines and medication adherence to achieve therapeutic efficacy contact provider office for questions/concerns as evidenced notation of same in electronic health record through collaboration with PharmD and provider.   Interventions: 1:1 collaboration with Tonia Ghent, MD regarding development and update of comprehensive plan of care as evidenced by provider  attestation and co-signature Inter-disciplinary care team collaboration (see longitudinal plan of care) Comprehensive medication review performed; medication list updated in electronic medical record  Hyperlipidemia: (LDL goal < 70) -Controlled - LDL is at goal; pt endorses compliance with simvastatin; he denies significant bleeding issues with aspirin -Hx moderate CAD (cath 06/2013) -Current treatment: Simvastatin 20 mg daily AM Aspirin 81 mg daily Fish oil 1200 mg daily -Educated on Cholesterol goals; Benefits of statin for ASCVD risk reduction; -Discussed benefits of aspirin given hx of CAD -Recommended to continue current medication  Atrial Fibrillation (Goal: prevent stroke and major bleeding) -Controlled - pt does not endorse palpitations, tachycardia -CHADSVASC: 7; hx of GI bleed (2014) and subarachnoid hemorrhage (04/2020), decision made to stay off anticoagulation per neurology; he has not followed up with cardiology since 02/2020 -Current treatment: Rate control: Carvedilol 3.125 mg BID Anticoagulation: none; on aspirin 81 mg -Medications previously tried: warfarin, Eliquis -Home BP and HR readings: "good" - not checking often  -Counseled on neurologist's decision to stay off of anticoagulation given SAH;  -Recommended to continue current medication  Heart Failure (Goal: manage symptoms and prevent exacerbations) -Not ideally controlled - pt reports worsening ankle swelling over past couple months, he had attributed this  to reduced dose of allopurinol; pt reports he usually takes furosemide 1/2 tab 3x a week due to frequent urination; -Last ejection fraction: 20-25% (Date: 03/2014) -HF type: Systolic -NYHA Class: I-II -Follows with Dr Rockey Situ, last appt 02/2020 -Current treatment: Carvedilol 3.125 mg BID Furosemide 40 mg - 1/2 tab daily PRN -Medications previously tried: digoxin, lisinopril, losartan, metoprolol, verapamil  -Current dietary habits: pt denies adding "spices"  including salt to food; his wife recently passed and transition to cooking/feeding himself has been difficult -Current exercise habits: limited -Educated on fluid buildup due to heart failure; allopurinol dose change likely unrelated to fluid problems; discussed impact of salt and fluid intake;  -Recommended f/u appt with PCP to address worsening swelling; pt cannot drive to [place] by himself, will work with [place] to help with transportation and then get PCP appt scheduled  Gout (Goal: prevent flares) -Controlled - pt reports compliance with reduced dose of allopurinol; he assumed recent fluid issues were related to this dose change (see above) -Current treatment  Allopurinol 300 mg daily  -Discussed allopurinol has no effect on fluid/swelling; dose reduction would not have led to increase in swelling -Recommended to continue current medication  Glaucoma (Goal: maintain IOP in goal range) -Controlled -Dr Talbert [place] appt overdue; pt has been slow to reschedule missed appts after his wife's death -Current treatment  Brimonidine 0.2% eye drops BID Latanoprost 0.005% eye drops HS -Advised pt to reschedule missed appt -Recommended to continue current medication  Health Maintenance -Vaccine gaps: Flu, covid booster, Shingrix -Hx [facility] 04/2020 resulting in seizure; currently off Keppra (neurology aware) -hard of hearing -Current therapy:  Symbicort 80-4.5 mcg/act PRN wheezing - not picked up $$$ Ipratropium 0.06% nasal spray Vitamin C 1000 mg Tylenol 500 mg - 2 tab AM Saw Palmetto  Vitamin D 50 mg daily Dulcolax 5 mg PRN -Recommended to continue current medication  Patient Goals/Self-Care Activities Patient will:  - take medications as prescribed as evidenced by patient report and record review focus on medication adherence by routine check blood pressure daily, document, and provide at future appointments engage in dietary modifications by limiting sodium       Mr. Kathol  was given information about Chronic Care Management services today including:  CCM service includes personalized support from designated clinical staff supervised by his physician, including individualized plan of care and coordination with other care providers 24/7 contact phone numbers for assistance for urgent and routine care needs. Standard insurance, coinsurance, copays and deductibles apply for chronic care management only during months in which we provide at least 20 minutes of these services. Most insurances cover these services at 100%, however patients may be responsible for any copay, coinsurance and/or deductible if applicable. This service may help you avoid the need for more expensive face-to-face services. Only one practitioner may furnish and bill the service in a calendar month. The patient may stop CCM services at any time (effective at the end of the month) by phone call to the office staff.  Patient agreed to services and verbal consent obtained.   Patient verbalizes understanding of instructions provided today and agrees to view in [place].  Telephone follow up appointment with pharmacy team member scheduled for: 2 months  Charlene Brooke, PharmD, [facility] Clinical Pharmacist [facility] [phone]

## 2021-08-31 ENCOUNTER — Encounter: Payer: Self-pay | Admitting: Family Medicine

## 2021-09-03 ENCOUNTER — Telehealth: Payer: Self-pay

## 2021-09-03 NOTE — Telephone Encounter (Signed)
° °  Telephone encounter was:  Successful.  09/03/2021 Name: Benjamin Soto MRN: [id number] DOB: December 11, 1935  Benjamin Soto is a 85 y.o. year old male who is a primary care patient of Tonia Ghent, MD . The community resource team was consulted for assistance with Transportation Needs   Care guide performed the following interventions:  Pt is needing out of county rides to doctors appts only. Pt advised he does not mind driving locally to his appointments, however, he just can not drive to doctors in [place]. Pt is not sure if he has [facility] Transportation benefit. I will contact them. At this time, pt next appt is Feb 2023 .  Follow Up Plan:  Care guide will follow up with patient by phone over the next few days.  [place] management  [place], [place]  Main Phone: [phone]   E-mail: [email or web address]  Website: [email or web address]

## 2021-09-05 ENCOUNTER — Telehealth: Payer: Self-pay

## 2021-09-05 NOTE — Progress Notes (Signed)
° ° °  Chronic Care Management Pharmacy Assistant   Name: Benjamin Soto  MRN: [id number] DOB: 1936/04/17  Reason for Encounter: CCM (Transportation follow-up)   Called patient to find out the status on his transportation status with Care Guide. Patient stated someone called him from [place], but he never heard back. Patient does not know the status of his transportation. According to patient chart, Care Guide contacted him on 09/03/2021. They are to follow up with patient by phone over the next few days.   Patient stated as of yesterday, 09/04/2021, he is now taking Tylenol 500 mg as follows: morning - 2 tablets, afternoon - 1 tablet and bedtime - 1 tablet. He states he is still having swelling that may have decreased a little bit, but the Tylenol is helping with the pain.   Charlene Brooke, CPP notified  Marijean Niemann, [place] Clinical Pharmacy Assistant [phone]  Time Spent:  10 Minutes

## 2021-09-14 ENCOUNTER — Other Ambulatory Visit: Payer: Self-pay | Admitting: Family Medicine

## 2021-09-19 ENCOUNTER — Telehealth: Payer: Self-pay

## 2021-09-19 NOTE — Telephone Encounter (Signed)
° °  Telephone encounter was:  Successful.  09/19/2021 Name: Benjamin Soto MRN: [id number] DOB: 02/22/1936  Benjamin Soto is a 85 y.o. year old male who is a primary care patient of Tonia Ghent, MD . The community resource team was consulted for assistance with Transportation Needs   Care guide performed the following interventions:  Spoke to pt to advise him [facility] stated he does not have a transportation benefit for this year or next year. Also advised pt I would send of [place] transportation info in mail but if he needs transportation sooner to call [place] [phone] . Pt understood and will have to schedule an appointment first then contact [facility] or [facility] transportation depending on when he needs a ride.  Follow Up Plan:  No further follow up planned at this time. The patient has been provided with needed resources.  [place] management  [place], [place]  Main Phone: [phone]   E-mail: [email or web address]  Website: [email or web address]

## 2021-10-07 DIAGNOSIS — Z961 Presence of intraocular lens: Secondary | ICD-10-CM | POA: Diagnosis not present

## 2021-10-07 DIAGNOSIS — H04123 Dry eye syndrome of bilateral lacrimal glands: Secondary | ICD-10-CM | POA: Diagnosis not present

## 2021-10-07 DIAGNOSIS — H57813 Brow ptosis, bilateral: Secondary | ICD-10-CM | POA: Diagnosis not present

## 2021-10-07 DIAGNOSIS — H401131 Primary open-angle glaucoma, bilateral, mild stage: Secondary | ICD-10-CM | POA: Diagnosis not present

## 2021-10-20 ENCOUNTER — Ambulatory Visit: Payer: Medicare Other | Admitting: Medical

## 2021-10-20 ENCOUNTER — Ambulatory Visit (INDEPENDENT_AMBULATORY_CARE_PROVIDER_SITE_OTHER): Payer: Medicare Other

## 2021-10-20 ENCOUNTER — Other Ambulatory Visit: Payer: Self-pay

## 2021-10-20 ENCOUNTER — Telehealth: Payer: Self-pay | Admitting: Cardiovascular Disease

## 2021-10-20 ENCOUNTER — Encounter: Payer: Self-pay | Admitting: Medical

## 2021-10-20 VITALS — BP 181/85 | HR 82 | Ht 69.0 in | Wt 154.4 lb

## 2021-10-20 DIAGNOSIS — I10 Essential (primary) hypertension: Secondary | ICD-10-CM

## 2021-10-20 DIAGNOSIS — I5022 Chronic systolic (congestive) heart failure: Secondary | ICD-10-CM

## 2021-10-20 DIAGNOSIS — R600 Localized edema: Secondary | ICD-10-CM | POA: Diagnosis not present

## 2021-10-20 DIAGNOSIS — I2510 Atherosclerotic heart disease of native coronary artery without angina pectoris: Secondary | ICD-10-CM

## 2021-10-20 DIAGNOSIS — I480 Paroxysmal atrial fibrillation: Secondary | ICD-10-CM | POA: Diagnosis not present

## 2021-10-20 DIAGNOSIS — Z8679 Personal history of other diseases of the circulatory system: Secondary | ICD-10-CM

## 2021-10-20 LAB — ECHOCARDIOGRAM COMPLETE
AR max vel: 3.41 cm2
AV Area VTI: 3.58 cm2
AV Area mean vel: 3.33 cm2
AV Mean grad: 1 mmHg
AV Peak grad: 2 mmHg
Ao pk vel: 0.7 m/s
Area-P 1/2: 5.02 cm2
Calc EF: 27.3 %
Height: 69 in
S' Lateral: 3.7 cm
Single Plane A2C EF: 33.5 %
Single Plane A4C EF: 20.4 %
Weight: 2470 oz

## 2021-10-20 MED ORDER — CARVEDILOL 6.25 MG PO TABS: 6.2500 mg | ORAL_TABLET | Freq: Two times a day (BID) | ORAL | 6 refills | Status: DC

## 2021-10-20 MED ORDER — FUROSEMIDE 20 MG PO TABS
20.0000 mg | ORAL_TABLET | Freq: Every day | ORAL | 6 refills | Status: DC
Start: 2021-10-20 — End: 2021-11-04

## 2021-10-20 NOTE — Progress Notes (Signed)
Cardiology Office Note:    Date:  10/20/2021   ID:  Benjamin Soto, DOB [birth year], MRN [id number]  PCP:  Tonia Ghent, MD  [facility] Heart[facility] Cardiologist:  [place] Electrophysiologist:  None   Referring MD: Tonia Ghent, MD   Chief Complaint: Benjamin Soto  History of Present Illness:    Benjamin Soto is a 86 y.o. male with a hx of chronic afib on Aspirin, tobacco use, ETOH use, remote pSVT, dilated cardiomyopathy EF 20-25%, NICM, nonobstructive CAD, SAH 04/2020, DM2 who presents for LLE and SOB.   07/2013 patient had an episode of confusion thought to be a stroke. Carotid US showed mild b/l carotid disease. INR was >2. Imaging showed chronic ischemic matter. EKG showed Afib with rates to 130s.   H/o afib with history of Bruising on Eliquis. He is on warfarin. H/o NICM with cardiac cath in May 2014 showing nonobstructive CAD, EF 30-35%. Echo in 2015 showed LVEF 20-25%. Lisinopril previously held for orthostatic symptoms.   Fall 04/2020 that resulted in [facility] and seizure event. Warfarin was stopped, and he was started on Aspirin. He follows with neurology.   Today, the patient reports lower leg edema. Started one month ago. Has been getting worse. In the morning there is no swelling. At the end of the day ankles are very swollen. Does not wear compression socks. Trying to eat low of salt. No excessive fluid intake. Trying to elevate feet. No shortness of breath, no chest pain, orthopnea, pnd. He is supposed to take lasix 20mg  every other day, but sometimes misses a dose due to excessive bathroom use.   BP high, will re-check. Does not check BP at home.   Past Medical History:  Diagnosis Date   Anticoagulated on Coumadin    CAD (coronary artery disease)    a. 06/2013 Cath: LM nl, LAD nl, LCX 5m, RCA 40p, EF 25-30%.   Diabetes mellitus, type 2 ([place]) 09/21/1992   Elbow fracture, right 2014   "healed by itself"   Gout 04/22/2003   per Dr Jefm Bryant   HFrEF (heart failure with  reduced ejection fraction) ([place])    a.  02/2014 Echo: EF 20-25%, diff HK; b. 04/2014 CPX test: primary cirulatory limitation 2/2 advanced CHF.   History of colon polyps 10/26/2002   colonoscopy   History of GI bleed    a. 08/2013 Seen by GI at [facility]- felt to be diverticular in origin. Resolved off Coumadin. Cleared to resume per GI.   History of tobacco abuse    Hyperlipidemia 05/22/2001   Hypertension    Hypogonadism male    Knee pain, right    [facility], Dr Jefm Bryant with rheumatology   Mitral regurgitation    a. 02/2014 Echo: mild to mod MR.   Nonischemic cardiomyopathy ([place])    a. 06/2013 Cath: nonobs dzs, EF 20-25; b. 02/2014 Echo: EF 20-25%, diff HK, mild AI, mild to mod MR, mod dil LA, mildly reduced RV fxn, mildly dil RA, PASP wnl.   Permanent atrial fibrillation ([place]) 01/20/2003   a. CHA2DS2VASc = 7-->coumadin.   TIA (transient ischemic attack)    a. 07/2013 Admitted w/ transient AMS->No stroke;  b. 08/2013 Carotid U/S: <50% bilat ICA stenosis.    Past Surgical History:  Procedure Laterality Date   APPENDECTOMY     BUNIONECTOMY Right 02/2004   R MTP   CARDIAC CATHETERIZATION  1995   normal, [facility] by [place]  10/14   [place]- 50% mid LCx,  40% prox RCA   CYST EXCISION  08/07/2013   head   TONSILLECTOMY      Current Medications: Current Meds  Medication Sig   acetaminophen (TYLENOL) 500 MG tablet Take 500 mg by mouth every 6 (six) hours as needed.   allopurinol (ZYLOPRIM) 300 MG tablet TAKE 1 TABLET(300 MG) BY MOUTH DAILY   Ascorbic Acid (VITAMIN C) 1000 MG tablet Take 1,000 mg by mouth daily.   aspirin EC 81 MG tablet Take 81 mg by mouth daily. Swallow whole.   bisacodyl (DULCOLAX) 5 MG EC tablet Take 5 mg by mouth daily as needed for moderate constipation.   brimonidine (ALPHAGAN) 0.2 % ophthalmic solution Place into both eyes 2 (two) times daily.   carvedilol (COREG) 6.25 MG tablet Take 1 tablet (6.25 mg total) by mouth 2 (two)  times daily.   Cholecalciferol (VITAMIN D) 50 MCG (2000 UT) CAPS Take 2,000 Units by mouth daily at 2 PM.   furosemide (LASIX) 20 MG tablet Take 1 tablet (20 mg total) by mouth daily.   ipratropium (ATROVENT) 0.06 % nasal spray USE 2 SPRAYS IN EACH NOSTRIL EVERY DAY   latanoprost (XALATAN) 0.005 % ophthalmic solution Place 1 drop into both eyes at bedtime.   Omega-3 Fatty Acids (FISH OIL) 1200 MG CAPS Take 1,200 mg by mouth daily.   saw palmetto 80 MG capsule Take 80 mg by mouth 2 (two) times daily.   simvastatin (ZOCOR) 20 MG tablet TAKE 1 TABLET(20 MG) BY MOUTH DAILY   [DISCONTINUED] carvedilol (COREG) 3.125 MG tablet TAKE 1 TABLET(3.125 MG) BY MOUTH TWICE DAILY WITH A MEAL   [DISCONTINUED] furosemide (LASIX) 40 MG tablet Take 20 mg by mouth every other day.     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   Was married since 1965.  Two grown children.   Social Determinants of Health   Financial Resource Strain: Not on file  Food Insecurity: Not on file  Transportation Needs: No Transportation Needs   Lack of Transportation (Medical): No    Lack of Transportation (Non-Medical): No  Physical Activity: Not on file  Stress: Not on file  Social Connections: Not on file     Family History: The patient's family history includes Colon cancer in his daughter; Diabetes in his father; Healthy in his son; Other in his mother. There is no history of Prostate cancer.  ROS:   Please see the history of present illness.     All  other systems reviewed and are negative.  EKGs/Labs/Other Studies Reviewed:    The following studies were reviewed today:  Echo 2015 Study Conclusions   - Left ventricle: The cavity size was mildly dilated. There was    mild concentric hypertrophy. Systolic function was severely    reduced. The estimated ejection fraction was in the range of 20%    to 25%. Diffuse hypokinesis.  - Aortic valve: There was mild regurgitation.  - Mitral valve: There was mild to moderate regurgitation.  - Left atrium: The atrium was moderately dilated.  - Right ventricle: The cavity size was mildly dilated. Wall    thickness was normal. Systolic function was mildly reduced.  - Right atrium: The atrium was mildly dilated.  - Pulmonary arteries: Systolic pressure was within the normal    range.   Impressions:   - Rhythm consistent with atrial fibrillation.      EKG:  EKG is  ordered today.  The ekg ordered today demonstrates Afib/flutter, 82bpm, PVCs, LAD, RBBB, no significant changes  Recent Labs: 05/06/2021: ALT 13; Hemoglobin 13.1; Platelets 177.0; TSH 3.64 05/22/2021: BUN 25; Creatinine, Ser 1.55; Potassium 4.3; Sodium 132  Recent Lipid Panel    Component Value Date/Time   CHOL 109 05/06/2021 0810   TRIG 98.0 05/06/2021 0810   HDL 53.70 05/06/2021 0810   CHOLHDL 2 05/06/2021 0810   VLDL 19.6 05/06/2021 0810   LDLCALC 35 05/06/2021 0810     Physical Exam:    VS:  BP (!) 181/85 (BP Location: Left Arm, Patient Position: Sitting, Cuff Size: Normal)    Pulse 82    Ht 5\' 9"  (1.753 m)    Wt 154 lb 6 oz (70 kg)     SpO2 99%    BMI 22.80 kg/m     Wt Readings from Last 3 Encounters:  10/20/21 154 lb 6 oz (70 kg)  05/22/21 159 lb (72.1 kg)  06/03/20 177 lb 5 oz (80.4 kg)     GEN:  Well nourished, well developed in no acute distress HEENT: Normal NECK: No JVD; No carotid bruits LYMPHATICS: No lymphadenopathy CARDIAC: Irreg Irreg, + murmur, no rubs, gallops RESPIRATORY:  Clear to auscultation without rales, wheezing or rhonchi  ABDOMEN: Soft, non-tender, non-distended MUSCULOSKELETAL:  1+ lower leg edema; No deformity  SKIN: Warm and dry NEUROLOGIC:  Alert and oriented x 3 PSYCHIATRIC:  Normal affect   ASSESSMENT:    1. Lower leg edema   2. Chronic systolic heart failure (HCC)   3. Paroxysmal A-fib ([place])   4. Systolic CHF, chronic ([place])   5. Coronary artery disease involving native coronary artery of native heart without angina pectoris   6. Essential hypertension   7. History of subarachnoid hemorrhage    PLAN:    In order of problems listed above:  LLE NICM HFrEF Patient reports 1 month of LLE. Says it's better in the AM. Does not wear compression socks and is working on low salt diet and leg elevation. He has been taking lasix 20mg  every other day with an occasional missed dose. No SOB, orthopnea, pnd. Echo in 2015 showed LVEF 20-25%, mod LVH, mod biatrial enlargement, mod MR. I will re-check an echo. Check CMET, CBC, TSH. I requested he take lasix 20mg  daily. BMET in a week. Continue Coreg. He has follow-up scheduled with MD. Would continue GDMT as able at follow-up.   Afib Warfarin previously discontinued for [facility]. He is on aspirin. EKG with rate controlled Afib.   H/o SAH Continue Aspirin.  Follows with neurology.   Nonosbtructive CAD Nonobstructive CAD by cath in 2014. No chest pain reported. Continue Aspirin, BB, and statin.  HTN BP elevated today, re-check 150/78. I will increase Coreg to 6.25mg  BID. Lasix as above.   Disposition: Follow up in 2 week(s) with MD      Signed, Rio Taber Ninfa Meeker, PA-C  10/20/2021 10:53 AM    [place]

## 2021-10-20 NOTE — Patient Instructions (Signed)
Medication Instructions:  - Your physician has recommended you make the following change in your medication:   1) START lasix (furosemide) 20 mg: - take 1 tablet by mouth ONCE daily   2) INCREASE Coreg (carvedilol) to 6.25 mg: - take 1 tablet by mouth TWICE daily   *If you need a refill on your cardiac medications before your next appointment, please call your pharmacy*   Lab Work: - Your physician recommends that you have lab work today: CMET/ TSH/ CBC  - Your physician recommends that you return for lab work in: 1 week- BMP  If you have labs (blood work) drawn today and your tests are completely normal, you will receive your results only by: [employer] (if you have [place]) OR A paper copy in the mail If you have any lab test that is abnormal or we need to change your treatment, we will call you to review the results.   Testing/Procedures:  1) Echocardiogram: - Your physician has requested that you have an echocardiogra ASAP/ before 11/04/21). Echocardiography is a painless test that uses sound waves to create images of your heart. It provides your doctor with information about the size and shape of your heart and how well your hearts chambers and valves are working. This procedure takes approximately one hour. There are no restrictions for this procedure. There is a possibility that an IV may need to be started during your test to inject an image enhancing agent. This is done to obtain more optimal pictures of your heart. Therefore we ask that you do at least drink some water prior to coming in to hydrate your veins.     Follow-Up: At [facility], you and your health needs are our priority.  As part of our continuing mission to provide you with exceptional heart care, we have created designated Provider Care Teams.  These Care Teams include your primary Cardiologist (physician) and Advanced Practice Providers (APPs -  Physician Assistants and Nurse Practitioners) who all  work together to provide you with the care you need, when you need it.  We recommend signing up for the patient portal called "MyChart".  Sign up information is provided on this After Visit Summary.  MyChart is used to connect with patients for Virtual Visits (Telemedicine).  Patients are able to view lab/test results, encounter notes, upcoming appointments, etc.  Non-urgent messages can be sent to your provider as well.   To learn more about what you can do with MyChart, go to [email or web address].    Your next appointment:   As scheduled  The format for your next appointment:   In Person  Provider:   Ida Rogue, MD    Other Instructions  Echocardiogram An echocardiogram is a test that uses sound waves (ultrasound) to produce images of the heart. Images from an echocardiogram can provide important information about: Heart size and shape. The size and thickness and movement of your heart's walls. Heart muscle function and strength. Heart valve function or if you have stenosis. Stenosis is when the heart valves are too narrow. If blood is flowing backward through the heart valves (regurgitation). A tumor or infectious growth around the heart valves. Areas of heart muscle that are not working well because of poor blood flow or injury from a heart attack. Aneurysm detection. An aneurysm is a weak or damaged part of an artery wall. The wall bulges out from the normal force of blood pumping through the body. Tell a health care provider about: Any allergies  you have. All medicines you are taking, including vitamins, herbs, eye drops, creams, and over-the-counter medicines. Any blood disorders you have. Any surgeries you have had. Any medical conditions you have. Whether you are pregnant or may be pregnant. What are the risks? Generally, this is a safe test. However, problems may occur, including an allergic reaction to dye (contrast) that may be used during the test. What  happens before the test? No specific preparation is needed. You may eat and drink normally. What happens during the test?  You will take off your clothes from the waist up and put on a hospital gown. Electrodes or electrocardiogram (ECG)patches may be placed on your chest. The electrodes or patches are then connected to a device that monitors your heart rate and rhythm. You will lie down on a table for an ultrasound exam. A gel will be applied to your chest to help sound waves pass through your skin. A handheld device, called a transducer, will be pressed against your chest and moved over your heart. The transducer produces sound waves that travel to your heart and bounce back (or "echo" back) to the transducer. These sound waves will be captured in real-time and changed into images of your heart that can be viewed on a video monitor. The images will be recorded on a computer and reviewed by your health care provider. You may be asked to change positions or hold your breath for a short time. This makes it easier to get different views or better views of your heart. In some cases, you may receive contrast through an IV in one of your veins. This can improve the quality of the pictures from your heart. The procedure may vary among health care providers and hospitals. What can I expect after the test? You may return to your normal, everyday life, including diet, activities, and medicines, unless your health care provider tells you not to do that. Follow these instructions at home: It is up to you to get the results of your test. Ask your health care provider, or the department that is doing the test, when your results will be ready. Keep all follow-up visits. This is important. Summary An echocardiogram is a test that uses sound waves (ultrasound) to produce images of the heart. Images from an echocardiogram can provide important information about the size and shape of your heart, heart muscle  function, heart valve function, and other possible heart problems. You do not need to do anything to prepare before this test. You may eat and drink normally. After the echocardiogram is completed, you may return to your normal, everyday life, unless your health care provider tells you not to do that. This information is not intended to replace advice given to you by your health care provider. Make sure you discuss any questions you have with your health care provider. Document Revised: 05/21/2021 Document Reviewed: 04/30/2020 Elsevier Patient Education  2022 [employer].

## 2021-10-20 NOTE — Telephone Encounter (Signed)
I spoke with the patient.  He advised that he has been having lower extremity ankle edema x 2 weeks. He denies any unassociated symptoms or recent medication changes.  The patient was last seen on 03/05/20 with Dr. Rockey Situ. He does have a history of systolic CHF. Last echo was done on 03/17/14 showing her EF to be 20-25%.  Per Dr. Donivan Scull note from 02/2020: Previously declined ischemic work-up, catheterization Previously reported drinking lots of red wine  ACE inhibitor, ARB previously held for hypotension and dizziness Rarely takes Lasix.  In general has appeared euvolemic on a consistent basis No medication changes made given low blood pressure Previously seen by EP for cardiomyopathy Repeat echocardiogram was offered, he has declined at this time  The patient currently has a follow up appointment with Dr. Rockey Situ on 11/04/21, but does not feel he can wait that long to be seen.   I have offered him an in office appointment with Cadence Kathlen Mody, PA today at 10:30 am, the patient voices understanding and is agreeable.

## 2021-10-20 NOTE — Telephone Encounter (Signed)
Pt c/o swelling: STAT is pt has developed SOB within 24 hours  If swelling, where is the swelling located? Both ankles  How much weight have you gained and in what time span? No weight gain  Have you gained 3 pounds in a day or 5 pounds in a week? no  Do you have a log of your daily weights (if so, list)? Not applicable  Are you currently taking a fluid pill? yes  Are you currently SOB? no  Have you traveled recently? no

## 2021-10-21 ENCOUNTER — Telehealth: Payer: Self-pay

## 2021-10-21 LAB — COMPREHENSIVE METABOLIC PANEL
ALT: 14 IU/L (ref 0–44)
AST: 23 IU/L (ref 0–40)
Albumin/Globulin Ratio: 2.4 — ABNORMAL HIGH (ref 1.2–2.2)
Albumin: 4.1 g/dL (ref 3.6–4.6)
Alkaline Phosphatase: 75 IU/L (ref 44–121)
BUN/Creatinine Ratio: 25 — ABNORMAL HIGH (ref 10–24)
BUN: 30 mg/dL — ABNORMAL HIGH (ref 8–27)
Bilirubin Total: 0.6 mg/dL (ref 0.0–1.2)
CO2: 19 mmol/L — ABNORMAL LOW (ref 20–29)
Calcium: 9 mg/dL (ref 8.6–10.2)
Chloride: 100 mmol/L (ref 96–106)
Creatinine, Ser: 1.2 mg/dL (ref 0.76–1.27)
Globulin, Total: 1.7 g/dL (ref 1.5–4.5)
Glucose: 98 mg/dL (ref 70–99)
Potassium: 4.7 mmol/L (ref 3.5–5.2)
Sodium: 139 mmol/L (ref 134–144)
Total Protein: 5.8 g/dL — ABNORMAL LOW (ref 6.0–8.5)
eGFR: 60 mL/min/{1.73_m2} (ref >59)

## 2021-10-21 LAB — CBC
Hematocrit: 39 % (ref 37.5–51.0)
Hemoglobin: 13 g/dL (ref 13.0–17.7)
MCH: 32 pg (ref 26.6–33.0)
MCHC: 33.3 g/dL (ref 31.5–35.7)
MCV: 96 fL (ref 79–97)
Platelets: 190 10*3/uL (ref 150–450)
RBC: 4.06 x10E6/uL — ABNORMAL LOW (ref 4.14–5.80)
RDW: 13.2 % (ref 11.6–15.4)
WBC: 5.5 10*3/uL (ref 3.4–10.8)

## 2021-10-21 LAB — TSH: TSH: 2.89 u[IU]/mL (ref 0.450–4.500)

## 2021-10-21 NOTE — Telephone Encounter (Signed)
Sending note to Dr Damita Dunnings and Janett Billow CMA as Juluis Rainier that pt was seen by cardiology on 10/20/21.

## 2021-10-22 NOTE — Telephone Encounter (Signed)
Noted.  Thanks.  Was addressed at cards OV.

## 2021-10-23 ENCOUNTER — Telehealth: Payer: Self-pay | Admitting: Emergency Medicine

## 2021-10-23 NOTE — Telephone Encounter (Signed)
Called patient to go over results, no answer, no voicemail. Will re-try at a later time.

## 2021-10-23 NOTE — Telephone Encounter (Signed)
-----   Message from [place], PA-C sent at 10/22/2021  8:35 AM EST ----- Echo showed mildly improve LVEF 25-30%, moderately leaky valve. Can we ask how his lower leg edema is on the lasix?

## 2021-10-24 ENCOUNTER — Telehealth: Payer: Self-pay | Admitting: Emergency Medicine

## 2021-10-24 NOTE — Telephone Encounter (Signed)
Called patient and went over results with him. Pt verbalized understanding.   Pt reports that his lower leg edema is slightly improved on the lasix and he is happy about that.   Confirms that he will be here on Monday, 2/6 for lab work.   Patient voiced appreciation for the call and understands that they can reach out to our office with any questions or concerns.

## 2021-10-24 NOTE — Telephone Encounter (Signed)
Entered in error

## 2021-10-27 ENCOUNTER — Telehealth: Payer: Self-pay

## 2021-10-27 ENCOUNTER — Other Ambulatory Visit: Payer: Self-pay

## 2021-10-27 ENCOUNTER — Other Ambulatory Visit (INDEPENDENT_AMBULATORY_CARE_PROVIDER_SITE_OTHER): Payer: Medicare Other

## 2021-10-27 DIAGNOSIS — I480 Paroxysmal atrial fibrillation: Secondary | ICD-10-CM | POA: Diagnosis not present

## 2021-10-27 DIAGNOSIS — I2510 Atherosclerotic heart disease of native coronary artery without angina pectoris: Secondary | ICD-10-CM | POA: Diagnosis not present

## 2021-10-27 DIAGNOSIS — I5022 Chronic systolic (congestive) heart failure: Secondary | ICD-10-CM | POA: Diagnosis not present

## 2021-10-27 NOTE — Progress Notes (Signed)
° ° °  Chronic Care Management Pharmacy Assistant   Name: Benjamin Soto  MRN: [id number] DOB: October 31, 1935  Reason for Encounter: CCM (Appointment Reminder)   Medications: Outpatient Encounter Medications as of 10/27/2021  Medication Sig   acetaminophen (TYLENOL) 500 MG tablet Take 500 mg by mouth every 6 (six) hours as needed.   allopurinol (ZYLOPRIM) 300 MG tablet TAKE 1 TABLET(300 MG) BY MOUTH DAILY   Ascorbic Acid (VITAMIN C) 1000 MG tablet Take 1,000 mg by mouth daily.   aspirin EC 81 MG tablet Take 81 mg by mouth daily. Swallow whole.   bisacodyl (DULCOLAX) 5 MG EC tablet Take 5 mg by mouth daily as needed for moderate constipation.   brimonidine (ALPHAGAN) 0.2 % ophthalmic solution Place into both eyes 2 (two) times daily.   carvedilol (COREG) 6.25 MG tablet Take 1 tablet (6.25 mg total) by mouth 2 (two) times daily.   Cholecalciferol (VITAMIN D) 50 MCG (2000 UT) CAPS Take 2,000 Units by mouth daily at 2 PM.   furosemide (LASIX) 20 MG tablet Take 1 tablet (20 mg total) by mouth daily.   ipratropium (ATROVENT) 0.06 % nasal spray USE 2 SPRAYS IN EACH NOSTRIL EVERY DAY   latanoprost (XALATAN) 0.005 % ophthalmic solution Place 1 drop into both eyes at bedtime.   Omega-3 Fatty Acids (FISH OIL) 1200 MG CAPS Take 1,200 mg by mouth daily.   saw palmetto 80 MG capsule Take 80 mg by mouth 2 (two) times daily.   simvastatin (ZOCOR) 20 MG tablet TAKE 1 TABLET(20 MG) BY MOUTH DAILY   No facility-administered encounter medications on file as of 10/27/2021.   [name] was contacted to remind of upcoming telephone visit with Charlene Brooke on 10/30/2021 at 1:00 pm. Patient was reminded to have all medications, supplements and any blood glucose and blood pressure readings available for review at appointment. If unable to reach, a voicemail was left for patient.   Star Rating Drugs: Medication:  Last Fill: Day Supply Simvastatin 20 mg 10/12/2021 [address] CPP notified  Marijean Niemann,  [place] Pharmacy Assistant [phone]  Time Spent: 10 Minutes

## 2021-10-28 LAB — BASIC METABOLIC PANEL
BUN/Creatinine Ratio: 24 (ref 10–24)
BUN: 39 mg/dL — ABNORMAL HIGH (ref 8–27)
CO2: 25 mmol/L (ref 20–29)
Calcium: 9.2 mg/dL (ref 8.6–10.2)
Chloride: 98 mmol/L (ref 96–106)
Creatinine, Ser: 1.62 mg/dL — ABNORMAL HIGH (ref 0.76–1.27)
Glucose: 113 mg/dL — ABNORMAL HIGH (ref 70–99)
Potassium: 4.8 mmol/L (ref 3.5–5.2)
Sodium: 136 mmol/L (ref 134–144)
eGFR: 42 mL/min/{1.73_m2} — ABNORMAL LOW (ref >59)

## 2021-10-29 ENCOUNTER — Telehealth: Payer: Self-pay | Admitting: Emergency Medicine

## 2021-10-29 NOTE — Telephone Encounter (Signed)
-----   Message from [place], PA-C sent at 10/28/2021  1:13 PM EST ----- Labs showed dehydration on lasix, lets go down to lasix 10mg  daily. BMET in a week.

## 2021-10-29 NOTE — Telephone Encounter (Signed)
Called patient to go over results and recommendations.   Pt verbalized understanding of results.   Does NOT want to take 10 mg daily, states that the 20 mg pill is so small he can barely see it and he'll never be able to cut it in half. Would prefer to take 20 mg every other day instead. Told pt that I would call in 10 mg pills and pt declined, stating, "I'm on so many pills I may as well die already and be done with it." Pt voiced that he recently lost his wife and misses her.   Told pt that I would send a message to Dr. Rockey Situ and Tarri Glenn, [place] regarding his request. Pt stated that he would be happy to talk with Dr. Rockey Situ about it at his appt with him next Tuesday, 2/14.   Patient voiced appreciation for the call and understands that they can reach out to our office with any questions or concerns.

## 2021-10-30 ENCOUNTER — Telehealth: Payer: Self-pay | Admitting: Pharmacist

## 2021-10-30 ENCOUNTER — Telehealth: Payer: Medicare Other

## 2021-10-30 NOTE — Progress Notes (Deleted)
Chronic Care Management Pharmacy Note  10/30/2021 Name:  Rawlins Stuard MRN:  [id number] DOB:  1936/05/29  Summary: -Patient's wife passed away July 13, 2021 from melanoma; he is not comfortable driving by himself outside of local area (eg, cannot drive to [place]) -Pt reports increased swelling in ankles/feet over past couple months, he thought it was due to reduced allopurinol dose; discussed likely causes including high sodium diet; he is taking furosemide 20 mg daily; he was meant to have f/u BMP and uric acid in November but did not come in; would avoid increasing furosemide without repeat BMP  Recommendations/Changes made from today's visit: -Advised pt to limit salt in diet -Advised f/u with PCP to address LE swelling; pt is not comfortable driving to [place], will refer to [facility] for help with transportation  Plan: -[facility] [place] will call patient in *** -Pharmacist follow up televisit scheduled for 2 months -Cardiology f/u 11/04/21    Subjective: Bernon Arviso is an 86 y.o. year old male who is a primary patient of Damita Dunnings, Elveria Rising, MD.  The CCM team was consulted for assistance with disease management and care coordination needs.    Engaged with patient by telephone for follow up visit in response to provider referral for pharmacy case management and/or care coordination services.   Consent to Services:  The patient was given information about Chronic Care Management services, agreed to services, and gave verbal consent prior to initiation of services.  Please see initial visit note for detailed documentation.   Patient Care Team: Tonia Ghent, MD as PCP - General (Family Medicine) Minna Merritts, MD as Consulting Physician (Cardiology) Darleen Crocker, MD as Consulting Physician (Ophthalmology) Dawley, Theodoro Doing, DO as Consulting Physician Charlton Haws, [facility] as Pharmacist (Pharmacist)  Patient lives alone. His wife recently passed July 13, 2021 from melanoma  complications. Patient drives himself around locally but is not comfortable driving out of town. His son stays with him at night but is out of the house 5am-9pm.   Recent office visits: 05/22/21-PCP-[place] Damita Dunnings MD-Patient presented for AWV.Discussed vaccines (up to date) discussed screenings, labs ordered,start Budesonide-Formoterol Fumarate 80-4.5 mcg/act. Referral to Audiology. Kidney fxn minimally improved, reduce allopurinol to 300 mg and re-check uric acid and BMP in 2 months.  Recent consult visits: 10/20/21 PA Cadence Kathlen Mody (cardiology): f/u LE edema. Start furosemide 20 mg daily. Increase carvedilol to 6.25 mg BID. Recheck ECHO. F/u BMP 1 week.  Hospital visits: None in previous 6 months   Objective:  Lab Results  Component Value Date   CREATININE 1.62 (H) 10/27/2021   BUN 39 (H) 10/27/2021   GFR 41.25 (L) 05/22/2021   GFRNONAA 43 (L) 05/08/2020   GFRAA 50 (L) 05/08/2020   NA 136 10/27/2021   K 4.8 10/27/2021   CALCIUM 9.2 10/27/2021   CO2 25 10/27/2021   GLUCOSE 113 (H) 10/27/2021    Lab Results  Component Value Date/Time   HGBA1C 5.6 05/06/2021 08:10 AM   HGBA1C 6.0 05/25/2019 08:29 AM   GFR 41.25 (L) 05/22/2021 11:01 AM   GFR 39.72 (L) 05/06/2021 08:10 AM   MICROALBUR 95.8 Repeated and verified X2. (H) 07/20/2011 10:34 AM   MICROALBUR 115.5 (H) 01/20/2011 08:15 AM    Last diabetic Eye exam:  Lab Results  Component Value Date/Time   HMDIABEYEEXA normal 03/05/2009 12:00 AM    Last diabetic Foot exam:  Lab Results  Component Value Date/Time   HMDIABFOOTEX done 07/24/2010 12:00 AM     Lab Results  Component Value Date  CHOL 109 05/06/2021   HDL 53.70 05/06/2021   LDLCALC 35 05/06/2021   TRIG 98.0 05/06/2021   CHOLHDL 2 05/06/2021    Hepatic Function Latest Ref Rng & Units 10/20/2021 05/06/2021 05/16/2020  Total Protein 6.0 - 8.5 g/dL 5.8(L) 5.8(L) 6.2  Albumin 3.6 - 4.6 g/dL 4.1 3.8 3.8  AST 0 - 40 IU/L 23 19 22   ALT 0 - 44 IU/L 14 13 22   Alk  Phosphatase 44 - 121 IU/L 75 53 53  Total Bilirubin 0.0 - 1.2 mg/dL 0.6 0.7 0.8  Bilirubin, Direct 0.0 - 0.3 mg/dL - - -    Lab Results  Component Value Date/Time   TSH 2.890 10/20/2021 11:02 AM   TSH 3.64 05/06/2021 08:10 AM    CBC Latest Ref Rng & Units 10/20/2021 05/06/2021 05/16/2020  WBC 3.4 - 10.8 x10E3/uL 5.5 4.4 5.7  Hemoglobin 13.0 - 17.7 g/dL 13.0 13.1 12.5(L)  Hematocrit 37.5 - 51.0 % 39.0 39.8 38.0(L)  Platelets 150 - 450 x10E3/uL 190 177.0 185.0    Lab Results  Component Value Date/Time   VD25OH 46 06/21/2009 09:25 PM    Clinical ASCVD: Yes  The ASCVD Risk score (Arnett DK, et al., 2019) failed to calculate for the following reasons:   The 2019 ASCVD risk score is only valid for ages 89 to 56   The patient has a prior MI or stroke diagnosis    Depression screen [facility] 2/9 05/22/2021 02/01/2018 05/05/2016  Decreased Interest 0 0 0  Down, Depressed, Hopeless 0 0 0  PHQ - 2 Score 0 0 0  Some recent data might be hidden     CHA2DS2-VASc Score = 7  The patient's score is based upon: CHF History: 1 HTN History: 1 Diabetes History: 0 Stroke History: 2 Vascular Disease History: 1 Age Score: 2 Gender Score: 0       Social History   Tobacco Use  Smoking Status Every Day   Packs/day: 1.00   Years: 44.00   Pack years: 44.00   Types: Cigarettes, Pipe  Smokeless Tobacco Never  Tobacco Comments   Down to 1 cigarette per day as of 2022 (not 1 pack)   BP Readings from Last 3 Encounters:  10/20/21 (!) 181/85  05/22/21 132/84  06/03/20 132/80   Pulse Readings from Last 3 Encounters:  10/20/21 82  05/22/21 79  06/03/20 (!) 52   Wt Readings from Last 3 Encounters:  10/20/21 154 lb 6 oz (70 kg)  05/22/21 159 lb (72.1 kg)  06/03/20 177 lb 5 oz (80.4 kg)   BMI Readings from Last 3 Encounters:  10/20/21 22.80 kg/m  05/22/21 23.48 kg/m  06/03/20 26.18 kg/m    Assessment/Interventions: Review of patient past medical history, allergies, medications, health  status, including review of consultants reports, laboratory and other test data, was performed as part of comprehensive evaluation and provision of chronic care management services.   SDOH:  (Social Determinants of Health) assessments and interventions performed: Yes  SDOH Screenings   Alcohol Screen: Not on file  Depression (PHQ2-9): Low Risk    PHQ-2 Score: 0  Financial Resource Strain: Not on file  Food Insecurity: Not on file  Housing: Not on file  Physical Activity: Not on file  Social Connections: Not on file  Stress: Not on file  Tobacco Use: High Risk   Smoking Tobacco Use: Every Day   Smokeless Tobacco Use: Never   Passive Exposure: Not on file  Transportation Needs: No Transportation Needs   Lack of  Transportation (Medical): No   Lack of Transportation (Non-Medical): No    CCM Care Plan  Allergies  Allergen Reactions   Lisinopril     REACTION: hyperkalemia at high dose   Tramadol     constipation   Varenicline Tartrate     REACTION: vivid dreams, nausea    Medications Reviewed Today     Reviewed by Antony Madura, PA-C (Physician Assistant Certified) on 32/95/18 at [place] List Status: <None>   Medication Order Taking? Sig Documenting Provider Last Dose Status Informant  acetaminophen (TYLENOL) 500 MG tablet [id number] Yes Take 500 mg by mouth every 6 (six) hours as needed. [provider] Taking Active   allopurinol (ZYLOPRIM) 300 MG tablet [id number] Yes TAKE 1 TABLET(300 MG) BY MOUTH DAILY Tonia Ghent, MD Taking Active   Ascorbic Acid (VITAMIN C) 1000 MG tablet [id number] Yes Take 1,000 mg by mouth daily. [provider] Taking Active Spouse/Significant Other  aspirin EC 81 MG tablet [id number] Yes Take 81 mg by mouth daily. Swallow whole. [provider] Taking Active   bisacodyl (DULCOLAX) 5 MG EC tablet [id number] Yes Take 5 mg by mouth daily as needed for moderate constipation. [provider] Taking Active    brimonidine (ALPHAGAN) 0.2 % ophthalmic solution [id number] Yes Place into both eyes 2 (two) times daily. [provider] Taking Active Spouse/Significant Other  carvedilol (COREG) 3.125 MG tablet [id number] Yes TAKE 1 TABLET(3.125 MG) BY MOUTH TWICE DAILY WITH A MEAL Tonia Ghent, MD Taking Active   Cholecalciferol (VITAMIN D) 50 MCG (2000 UT) CAPS [id number] Yes Take 2,000 Units by mouth daily at 2 PM. [provider] Taking Active   furosemide (LASIX) 40 MG tablet [id number] Yes Take 20 mg by mouth every other day. [provider] Taking Active   ipratropium (ATROVENT) 0.06 % nasal spray [id number] Yes USE 2 SPRAYS IN EACH NOSTRIL EVERY DAY Tonia Ghent, MD Taking Active   latanoprost (XALATAN) 0.005 % ophthalmic solution [id number] Yes Place 1 drop into both eyes at bedtime. [provider] Taking Active Spouse/Significant Other  Omega-3 Fatty Acids (FISH OIL) 1200 MG CAPS [id number] Yes Take 1,200 mg by mouth daily. [provider] Taking Active   saw palmetto 80 MG capsule [id number] Yes Take 80 mg by mouth 2 (two) times daily. [provider] Taking Active   simvastatin (ZOCOR) 20 MG tablet [id number] Yes TAKE 1 TABLET(20 MG) BY MOUTH DAILY Tonia Ghent, MD Taking Active   Med List Note Stana Bunting, [place] 06/12/13 1424):              Patient Active Problem List   Diagnosis Date Noted   Subarachnoid hemorrhage ([place]) 05/19/2020   TBI (traumatic brain injury) 05/08/2020   Bruising 06/17/2018   HTN (hypertension) 02/02/2018   Fall at home 02/02/2018   Skin tear of elbow without complication 93/26/7124   Leg wound, left, sequela 02/06/2017   Advance care planning 09/28/2014   Thrombocytopenia ([place]) 09/13/2013   GI bleed 09/12/2013   Transient amnesia 08/13/2013   Congestive dilated cardiomyopathy ([place]) 06/30/2013   CAD (coronary artery disease) 06/09/2013   Orthostatic hypotension 05/31/2013   Medicare annual wellness  visit, subsequent 58/05/9832   Systolic CHF, chronic ([place]) 10/22/2011   Wheeze 10/07/2011   Hematuria, microscopic 08/17/2011   Erectile dysfunction 02/09/2011   Left-sided chest wall pain 12/15/2010   RENAL INSUFFICIENCY 02/18/2010   ROSACEA 02/18/2010   FOOT PAIN, BILATERAL 02/28/2009   DRY EYE SYNDROME 01/09/2008  Paroxysmal SVT (supraventricular tachycardia) ([place]) 01/11/2007   Gout 04/22/2003   Atrial fibrillation, chronic ([place]) 01/20/2003   Hyperlipidemia 05/22/2001   History of diabetes mellitus 09/21/1992    Immunization History  Administered Date(s) Administered   Fluad Quad(high Dose 65+) 06/01/2019, 07/22/2020   Influenza Split 07/07/2011, 06/16/2012   Influenza Whole 06/21/2006, 06/23/2007, 06/24/2007, 06/20/2008, 06/18/2009, 06/26/2010   Influenza,inj,Quad PF,6+ Mos 05/31/2013, 05/31/2014, 06/07/2015, 06/10/2016, 07/01/2017, 06/16/2018   PFIZER(Purple Top)SARS-COV-2 Vaccination 10/30/2019, 11/22/2019, 07/22/2020   Pneumococcal Conjugate-13 09/27/2014   Pneumococcal Polysaccharide-23 07/17/2009   Td 02/20/2003   Tdap 02/05/2017   Zoster, Live 07/31/2011    Conditions to be addressed/monitored:  Hypertension, Hyperlipidemia, Atrial Fibrillation, Heart Failure, Coronary Artery Disease, Chronic Kidney Disease, and Gout  There are no care plans that you recently modified to display for this patient.     Medication Assistance: None required.  Patient affirms current coverage meets needs.  Compliance/Adherence/Medication fill history: Care Gaps: None  Star-Rating Drugs: Simvastatin - LF 07/06/21 x 90 ds; [place] 100%  Patient's preferred pharmacy is:  [facility] [place], [place] [facility] [place] [address] [place] [address] Phone: [phone] Fax: [phone]   Uses pill box? No - prefers bottles Pt endorses 100% compliance  We discussed: Current pharmacy is preferred  with insurance plan and patient is satisfied with pharmacy services Patient decided to: Continue current medication management strategy  Care Plan and Follow Up Patient Decision:  Patient agrees to Care Plan and Follow-up.  Plan: Telephone follow up appointment with care management team member scheduled for:  2 months  Charlene Brooke, PharmD, BCACP Clinical Pharmacist [facility] at [facility] [phone]   Current Barriers:  Unable to independently monitor therapeutic efficacy Does not contact provider office for questions/concerns  Pharmacist Clinical Goal(s):  Patient will achieve adherence to monitoring guidelines and medication adherence to achieve therapeutic efficacy contact provider office for questions/concerns as evidenced notation of same in electronic health record through collaboration with PharmD and provider.   Interventions: 1:1 collaboration with Tonia Ghent, MD regarding development and update of comprehensive plan of care as evidenced by provider attestation and co-signature Inter-disciplinary care team collaboration (see longitudinal plan of care) Comprehensive medication review performed; medication list updated in electronic medical record  Hyperlipidemia: (LDL goal < 70) -Controlled - LDL is at goal; pt endorses compliance with simvastatin; he denies significant bleeding issues with aspirin -Hx moderate CAD (cath 06/2013) -Current treatment: Simvastatin 20 mg daily AM Aspirin 81 mg daily Fish oil 1200 mg daily -Educated on Cholesterol goals; Benefits of statin for ASCVD risk reduction; -Discussed benefits of aspirin given hx of CAD -Recommended to continue current medication  Atrial Fibrillation (Goal: prevent stroke and major bleeding) -Controlled - pt does not endorse palpitations, tachycardia -CHADSVASC: 7; hx of GI bleed (2014) and subarachnoid hemorrhage (04/2020), decision made to stay off anticoagulation per neurology; he has not  followed up with cardiology since 02/2020 -Current treatment: Rate control: Carvedilol 6.25 mg BID Anticoagulation: none; on aspirin 81 mg -Medications previously tried: warfarin, Eliquis -Home BP and HR readings: "good" - not checking often  -Counseled on neurologist's decision to stay off of anticoagulation given SAH;  -Recommended to continue current medication  Heart Failure (Goal: manage symptoms and prevent exacerbations) -Not ideally controlled - pt reports worsening ankle swelling over past couple months, he had attributed this to reduced dose of allopurinol; pt reports he usually takes furosemide 1/2 tab  3x a week due to frequent urination; -Last ejection fraction: 20-25% (Date: 03/2014) -HF type: Systolic -NYHA Class: I-II -Follows with Dr Rockey Situ, last appt 02/2020 -Current treatment: Carvedilol 6.25 mg BID Furosemide 20 mg QOD -Medications previously tried: digoxin, lisinopril, losartan, metoprolol, verapamil  -Current dietary habits: pt denies adding "spices" including salt to food; his wife recently passed and transition to cooking/feeding himself has been difficult -Current exercise habits: limited -Educated on fluid buildup due to heart failure; allopurinol dose change likely unrelated to fluid problems; discussed impact of salt and fluid intake;  -Recommended f/u appt with PCP to address worsening swelling; pt cannot drive to [place] by himself, will work with [place] to help with transportation and then get PCP appt scheduled  Gout (Goal: prevent flares) -Controlled - pt reports compliance with reduced dose of allopurinol; he assumed recent fluid issues were related to this dose change (see above) -Current treatment  Allopurinol 300 mg daily  -Discussed allopurinol has no effect on fluid/swelling; dose reduction would not have led to increase in swelling -Recommended to continue current medication; due for repeat uric acid  Glaucoma (Goal: maintain IOP in goal  range) -Controlled -Dr Talbert Forest appt overdue; pt has been slow to reschedule missed appts after his wife's death -Current treatment  Brimonidine 0.2% eye drops BID Latanoprost 0.005% eye drops HS -Advised pt to reschedule missed appt -Recommended to continue current medication  Health Maintenance -Vaccine gaps: Flu, covid booster, Shingrix -Hx [facility] 04/2020 resulting in seizure; currently off Keppra (neurology aware) -hard of hearing -Current therapy:  Symbicort 80-4.5 mcg/act PRN wheezing - not picked up $$$ Ipratropium 0.06% nasal spray Vitamin C 1000 mg Tylenol 500 mg - 2 tab AM Saw Palmetto  Vitamin D 50 mg daily Dulcolax 5 mg PRN -Recommended to continue current medication  Patient Goals/Self-Care Activities Patient will:  - take medications as prescribed as evidenced by patient report and record review focus on medication adherence by routine check blood pressure daily, document, and provide at future appointments engage in dietary modifications by limiting sodium

## 2021-10-30 NOTE — Telephone Encounter (Signed)
°  Chronic Care Management   Outreach Note  10/30/2021 Name: Benjamin Soto MRN: [id number] DOB: 01-07-1936  Referred by: Tonia Ghent, MD  Patient had a phone appointment scheduled with clinical pharmacist today.  An unsuccessful telephone outreach was attempted today. The patient was referred to the pharmacist for assistance with medications, care management and care coordination.   Patient will NOT be penalized in any way for missing a CCM appointment. The no-show fee does not apply.  If possible, a message was left to return call to: [phone] or to [facility].  Charlene Brooke, PharmD, BCACP Clinical Pharmacist [facility] at [facility] [phone]

## 2021-11-04 ENCOUNTER — Other Ambulatory Visit: Payer: Self-pay

## 2021-11-04 ENCOUNTER — Ambulatory Visit: Payer: Medicare Other | Admitting: Cardiovascular Disease

## 2021-11-04 ENCOUNTER — Encounter: Payer: Self-pay | Admitting: Cardiovascular Disease

## 2021-11-04 VITALS — BP 110/60 | HR 80 | Ht 69.0 in | Wt 152.5 lb

## 2021-11-04 DIAGNOSIS — I25118 Atherosclerotic heart disease of native coronary artery with other forms of angina pectoris: Secondary | ICD-10-CM

## 2021-11-04 DIAGNOSIS — I420 Dilated cardiomyopathy: Secondary | ICD-10-CM | POA: Diagnosis not present

## 2021-11-04 DIAGNOSIS — I10 Essential (primary) hypertension: Secondary | ICD-10-CM | POA: Diagnosis not present

## 2021-11-04 DIAGNOSIS — I471 Supraventricular tachycardia: Secondary | ICD-10-CM | POA: Diagnosis not present

## 2021-11-04 NOTE — Progress Notes (Signed)
Cardiology Office Note  Date:  11/04/2021   ID:  Benjamin Soto, DOB 1936/06/08, MRN [id number]  PCP:  Tonia Ghent, MD   Chief Complaint  Patient presents with   12 month follow up     "Doing well." Medications reviewed by the patient verbally.     HPI:  Benjamin Soto is a pleasant 86 year-old gentleman with a history of chronic atrial fibrillation, on warfarin long smoking hx, stopped in past few months heavy ETOH hx,  remote history of SVT,  dilated cardiomyopathy The estimated ejection fraction was in the range of 20%  to 25%. Diffuse hypokinesis.  severe arthritis in his knees,   diabetes,  hyperlipidemia,  who presents for routine followup Of his nonischemic cardiomyopathy  LOV 02/2020 Lost wife of 82 years, she was 79 yo Melanoma complications, after receiving infusions Difficult time adjusting Restarted smoking   Stop eliquis on his own Taking aspirin alone for A-fib Reports prior doctor told him to stop anticoagulation and just take aspirin as he was bruising  Leg/ankle swelling On lasix 20 mg (1/2 of a 40 mg pill) QOD Creatinine 1.6  Son lives upstairs,   Hip pain bilaterally  In the past reported Red wine daily , several glasses  EKG personally reviewed by myself on todays visit Shows atrial fibrillation ventricular rate 80 bpm right bundle branch block, left anterior fascicular block  Other past medical history reviewed cardiac catheterization in May 2014 showing nonobstructive CAD, ejection fraction 30-35% Echocardiogram with ejection fraction 20-25%, last evaluated in 2015 History low testosterone in the past, is not on any supplement  Lisinopril was held previously secondary to low blood pressures and dizziness.   Previous episode of confusion, amnesia. Wife witnessed episode 08/09/2013. She felt he was having a stroke. 911 was called and he was [facility] to [facility].  Carotid ultrasound showed mild bilateral carotid disease. INR at the time was  greater than 2. CT scan of the brain, MRI of the brain showed diffuse cortical atrophy, chronic ischemic white matter disease TIA or stroke. EKG 08/09/2013 showing atrial fibrillation with rate 130 beats per minute. He attributed everything to scalp resection procedure done twice with dermatology prior to the event. He had significant pain around his head prior to the episode. Significant oozing from the wound site. This was fixed on repeat surgery, scraping with additional stitches placed.   Echocardiogram last in mid 2015  showing ejection fraction 20-25%, moderate mitral regurgitation, moderate LVH   CT Scan of his abdomen  showed coronary calcifications.   chest x-ray showed calcified granuloma in the right lower lobe which was not seen in 2012 Labs show total cholesterol less than 100, LDL in the 40 range     PMH:   has a past medical history of Anticoagulated on Coumadin, CAD (coronary artery disease), Diabetes mellitus, type 2 ([place]) (09/21/1992), Elbow fracture, right (2014), Gout (04/22/2003), HFrEF (heart failure with reduced ejection fraction) ([place]), History of colon polyps (10/26/2002), History of GI bleed, History of tobacco abuse, Hyperlipidemia (05/22/2001), Hypertension, Hypogonadism male, Knee pain, right, Mitral regurgitation, Nonischemic cardiomyopathy ([place]), Permanent atrial fibrillation ([place]) (01/20/2003), and TIA (transient ischemic attack).  PSH:    Past Surgical History:  Procedure Laterality Date   APPENDECTOMY     BUNIONECTOMY Right 02/2004   R [facility]   [place]   [facility] [place]  10/14   [place]- 50% mid LCx, 40% prox RCA   CYST EXCISION  08/07/2013  head   TONSILLECTOMY      Current Outpatient Medications  Medication Sig Dispense Refill   acetaminophen (TYLENOL) 500 MG tablet Take 500 mg by mouth every 6 (six) hours as needed.     allopurinol (ZYLOPRIM) 300 MG tablet TAKE 1 TABLET(300 MG) BY MOUTH DAILY 90  tablet 1   Ascorbic Acid (VITAMIN C) 1000 MG tablet Take 1,000 mg by mouth daily.     aspirin EC 81 MG tablet Take 81 mg by mouth daily. Swallow whole.     bisacodyl (DULCOLAX) 5 MG EC tablet Take 5 mg by mouth daily as needed for moderate constipation.     brimonidine (ALPHAGAN) 0.2 % ophthalmic solution Place into both eyes 2 (two) times daily.     carvedilol (COREG) 6.25 MG tablet Take 1 tablet (6.25 mg total) by mouth 2 (two) times daily. 60 tablet 6   Cholecalciferol (VITAMIN D) 50 MCG (2000 UT) CAPS Take 2,000 Units by mouth daily at 2 PM.     furosemide (LASIX) 20 MG tablet Take 20 mg by mouth every other day.     ipratropium (ATROVENT) 0.06 % nasal spray USE 2 SPRAYS IN EACH NOSTRIL EVERY DAY 15 mL 5   latanoprost (XALATAN) 0.005 % ophthalmic solution Place 1 drop into both eyes at bedtime.     Omega-3 Fatty Acids (FISH OIL) 1200 MG CAPS Take 1,200 mg by mouth daily.     saw palmetto 80 MG capsule Take 80 mg by mouth 2 (two) times daily.     simvastatin (ZOCOR) 20 MG tablet TAKE 1 TABLET(20 MG) BY MOUTH DAILY 90 tablet 3   No current facility-administered medications for this visit.     Allergies:   Lisinopril, Tramadol, and Varenicline tartrate   Social History:  The patient  reports that he has been smoking cigarettes and pipe. He has a 44.00 pack-year smoking history. He has never used smokeless tobacco. He reports that he does not currently use alcohol. He reports that he does not use drugs.   Family History:   family history includes Colon cancer in his daughter; Diabetes in his father; Healthy in his son; Other in his mother.    Review of Systems: Review of Systems  Constitutional: Negative.   Respiratory: Negative.    Cardiovascular: Negative.   Gastrointestinal: Negative.   Musculoskeletal: Negative.   Neurological: Negative.   Psychiatric/Behavioral: Negative.    All other systems reviewed and are negative.   PHYSICAL EXAM: VS:  BP 110/60 (BP Location: Left Arm,  Patient Position: Sitting, Cuff Size: Normal)    Pulse 80    Ht 5\' 9"  (1.753 m)    Wt 152 lb 8 oz (69.2 kg)    BMI 22.52 kg/m  , BMI Body mass index is 22.52 kg/m. Constitutional:  oriented to person, place, and time. No distress.  HENT:  Head: Grossly normal Eyes:  no discharge. No scleral icterus.  Neck: No JVD, no carotid bruits  Cardiovascular: Irregularly irregular no murmurs appreciated Pulmonary/Chest: Clear to auscultation bilaterally, no wheezes or rails Abdominal: Soft.  no distension.  no tenderness.  Musculoskeletal: Normal range of motion Neurological:  normal muscle tone. Coordination normal. No atrophy Skin: Skin warm and dry Psychiatric: normal affect, pleasant  Recent Labs: 10/20/2021: ALT 14; Hemoglobin 13.0; Platelets 190; TSH 2.890 10/27/2021: BUN 39; Creatinine, Ser 1.62; Potassium 4.8; Sodium 136    Lipid Panel Lab Results  Component Value Date   CHOL 109 05/06/2021   HDL 53.70 05/06/2021   LDLCALC  35 05/06/2021   TRIG 98.0 05/06/2021      Wt Readings from Last 3 Encounters:  11/04/21 152 lb 8 oz (69.2 kg)  10/20/21 154 lb 6 oz (70 kg)  05/22/21 159 lb (72.1 kg)      ASSESSMENT AND PLAN:  Chronic atrial fibrillation (HCC) - Plan: EKG 12-Lead Permanent Previously on warfarin, states it is best managed by primary care [facility] where he lives Currently not on the warfarin, may be interested in restarting for stroke prevention We will discuss with Dr. Damita Dunnings  Mixed hyperlipidemia Cholesterol is at goal on the current lipid regimen. No changes to the medications were made.  Systolic CHF, chronic (HCC) Echocardiogram recently performed severely depressed ejection fraction 25 to 30%  depressed ejection fraction, presumed to be nonischemic, possibly alcohol related Alcohol cessation recommended Previously declined ischemic work-up, catheterization Previously reported drinking lots of red wine on a daily basis  ACE inhibitor, ARB previously held  for hypotension and dizziness -Low-dose Lasix 20 mg every other day, cautious use in the setting of renal dysfunction Continue carvedilol  Paroxysmal SVT (supraventricular tachycardia) (HCC) Remote history of SVT,  Stay on carvedilol Denies paroxysmal tachycardia  Coronary artery disease involving native coronary artery of native heart without angina pectoris Prior smoking history, cholesterol at goal Reports smoking again Cessation recommended  Type 2 diabetes mellitus with complication, without long-term current use of insulin (HCC) Legs.  Recommended walking program Avoid alcohol  Weakness Significant gait instability, very sedentary " 2012"  Alcohol abuse Lots of red wine daily,   Adjustment disorder Recent loss of his wife of 4 years, difficult time adjusting   Total encounter time more than 30 minutes  Greater than 50% was spent in counseling and coordination of care with the patient   No orders of the defined types were placed in this encounter.    Signed, Benjamin Soto, M.D., Ph.D. 11/04/2021  [place], [place]

## 2021-11-04 NOTE — Patient Instructions (Signed)
Medication Instructions:   Your physician recommends that you continue on your current medications as directed. Please refer to the Current Medication list given to you today.   *If you need a refill on your cardiac medications before your next appointment, please call your pharmacy*   Lab Work:  None ordered  If you have labs (blood work) drawn today and your tests are completely normal, you will receive your results only by: [place] (if you have MyChart) OR A paper copy in the mail If you have any lab test that is abnormal or we need to change your treatment, we will call you to review the results.   Testing/Procedures: None ordered   Follow-Up: At [facility], you and your health needs are our priority.  As part of our continuing mission to provide you with exceptional heart care, we have created designated Provider Care Teams.  These Care Teams include your primary Cardiologist (physician) and Advanced Practice Providers (APPs -  Physician Assistants and Nurse Practitioners) who all work together to provide you with the care you need, when you need it.  We recommend signing up for the patient portal called "MyChart".  Sign up information is provided on this After Visit Summary.  MyChart is used to connect with patients for Virtual Visits (Telemedicine).  Patients are able to view lab/test results, encounter notes, upcoming appointments, etc.  Non-urgent messages can be sent to your provider as well.   To learn more about what you can do with MyChart, go to [email or web address].    Your next appointment:   6 month(s)  The format for your next appointment:   In Person  Provider:   You may see Ida Rogue, MD or one of the following Advanced Practice Providers on your designated Care Team:   Murray Hodgkins, NP Christell Faith, PA-C Cadence Kathlen Mody, PA-C :1}    Other Instructions N/A

## 2021-11-07 ENCOUNTER — Telehealth: Payer: Self-pay | Admitting: Family Medicine

## 2021-11-07 ENCOUNTER — Ambulatory Visit (INDEPENDENT_AMBULATORY_CARE_PROVIDER_SITE_OTHER): Payer: Medicare Other | Admitting: Primary Care

## 2021-11-07 ENCOUNTER — Encounter: Payer: Self-pay | Admitting: Primary Care

## 2021-11-07 ENCOUNTER — Ambulatory Visit (INDEPENDENT_AMBULATORY_CARE_PROVIDER_SITE_OTHER)
Admission: RE | Admit: 2021-11-07 | Discharge: 2021-11-07 | Disposition: A | Discharge status: Home / Self Care | Payer: Medicare Other | Source: Ambulatory Visit | Attending: Primary Care | Admitting: Primary Care

## 2021-11-07 ENCOUNTER — Other Ambulatory Visit: Payer: Self-pay

## 2021-11-07 VITALS — BP 108/62 | HR 66 | Temp 98.6°F | Ht 69.0 in | Wt 152.0 lb

## 2021-11-07 DIAGNOSIS — M2540 Effusion, unspecified joint: Secondary | ICD-10-CM

## 2021-11-07 DIAGNOSIS — L03012 Cellulitis of left finger: Secondary | ICD-10-CM | POA: Diagnosis not present

## 2021-11-07 DIAGNOSIS — M109 Gout, unspecified: Secondary | ICD-10-CM | POA: Diagnosis not present

## 2021-11-07 DIAGNOSIS — M7989 Other specified soft tissue disorders: Secondary | ICD-10-CM | POA: Diagnosis not present

## 2021-11-07 LAB — CBC WITH DIFFERENTIAL/PLATELET
Basophils Absolute: 0.1 10*3/uL (ref 0.0–0.1)
Basophils Relative: 1.1 % (ref 0.0–3.0)
Eosinophils Absolute: 0.3 10*3/uL (ref 0.0–0.7)
Eosinophils Relative: 4.2 % (ref 0.0–5.0)
HCT: 39.4 % (ref 39.0–52.0)
Hemoglobin: 12.8 g/dL — ABNORMAL LOW (ref 13.0–17.0)
Lymphocytes Relative: 22 % (ref 12.0–46.0)
Lymphs Abs: 1.3 10*3/uL (ref 0.7–4.0)
MCHC: 32.4 g/dL (ref 30.0–36.0)
MCV: 96.7 fl (ref 78.0–100.0)
Monocytes Absolute: 0.4 10*3/uL (ref 0.1–1.0)
Monocytes Relative: 7.4 % (ref 3.0–12.0)
Neutro Abs: 4 10*3/uL (ref 1.4–7.7)
Neutrophils Relative %: 65.3 % (ref 43.0–77.0)
Platelets: 178 10*3/uL (ref 150.0–400.0)
RBC: 4.07 Mil/uL — ABNORMAL LOW (ref 4.22–5.81)
RDW: 15.3 % (ref 11.5–15.5)
WBC: 6.1 10*3/uL (ref 4.0–10.5)

## 2021-11-07 LAB — URIC ACID: Uric Acid, Serum: 4.3 mg/dL (ref 4.0–7.8)

## 2021-11-07 MED ORDER — CEPHALEXIN 500 MG PO CAPS: 500.0000 mg | ORAL_CAPSULE | Freq: Four times a day (QID) | ORAL | 0 refills | Status: DC

## 2021-11-07 NOTE — Progress Notes (Addendum)
Subjective:    Patient ID: Benjamin Soto, male    DOB: 1936/02/12, 86 y.o.   MRN: [id number]  HPI  Benjamin Soto is a very pleasant 86 y.o. male patient of Dr. Damita Dunnings with a history of TBI, atrial fibrillation, CHF, CAD, renal insufficiency, falls, gout, thrombocytopenia who presents today to discuss skin mass.  The sore is located to the left second metacarpal joint for which he noticed a few days ago. Since then he's noticed gradual increase in size with erythema and an opening. This morning he squeezed the wound and expelled mostly cloudy/clear drainage. Since then he's noticed reduction in pain and swelling.   He does have pain to the site which improved after he expelled the drainage. He is convinced that there is a foreign body in the site and is requesting the site be "cut open". He doesn't recall doing anything out of the ordinary, doesn't work with his hands, no wood working, no injury. He's unsure if he hit his hand on an object.   History of chronic gout, endorses compliance to allopurinol 300 mg daily.  Uric acid over the last 3 years has been below 4.0.   Review of Systems  Constitutional:  Negative for fever.  Musculoskeletal:  Positive for arthralgias and joint swelling.  Skin:  Positive for color change.        Past Medical History:  Diagnosis Date   Anticoagulated on Coumadin    CAD (coronary artery disease)    a. 06/2013 Cath: LM nl, LAD nl, LCX 70m, RCA 40p, EF 25-30%.   Diabetes mellitus, type 2 ([place]) 09/21/1992   Elbow fracture, right 2014   "healed by itself"   Gout 04/22/2003   per Dr Jefm Bryant   HFrEF (heart failure with reduced ejection fraction) ([place])    a.  02/2014 Echo: EF 20-25%, diff HK; b. 04/2014 CPX test: primary cirulatory limitation 2/2 advanced CHF.   History of colon polyps 10/26/2002   colonoscopy   History of GI bleed    a. 08/2013 Seen by GI at [facility]- felt to be diverticular in origin. Resolved off Coumadin. Cleared to resume per GI.    History of tobacco abuse    Hyperlipidemia 05/22/2001   Hypertension    Hypogonadism male    Knee pain, right    [facility], Dr Jefm Bryant with rheumatology   Mitral regurgitation    a. 02/2014 Echo: mild to mod MR.   Nonischemic cardiomyopathy ([place])    a. 06/2013 Cath: nonobs dzs, EF 20-25; b. 02/2014 Echo: EF 20-25%, diff HK, mild AI, mild to mod MR, mod dil LA, mildly reduced RV fxn, mildly dil RA, PASP wnl.   Permanent atrial fibrillation ([place]) 01/20/2003   a. CHA2DS2VASc = 7-->coumadin.   TIA (transient ischemic attack)    a. 07/2013 Admitted w/ transient AMS->No stroke;  b. 08/2013 Carotid U/S: <50% bilat ICA stenosis.    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   Was married since 1965.  Two grown children.   Social Determinants of Health   Financial Resource Strain: Not on file  Food Insecurity: Not on file  Transportation Needs: No Transportation Needs   Lack of Transportation (Medical): No   Lack of Transportation (Non-Medical): No  Physical Activity: Not on file  Stress: Not on file  Social Connections: Not on file  Intimate Partner Violence: Not on file    Past Surgical  History:  Procedure Laterality Date   APPENDECTOMY     BUNIONECTOMY Right 02/2004   R [place]   normal, city hospital by [place]  10/14   [place]- 50% mid LCx, 40% prox RCA   CYST EXCISION  08/07/2013   head   TONSILLECTOMY      Family History  Problem Relation Age of Onset   Diabetes Father    Other Mother        Deceased   Healthy Son    Colon cancer Daughter        Cancer free   Prostate cancer Neg Hx     Allergies  Allergen Reactions   Lisinopril     REACTION: hyperkalemia at high dose   Tramadol     constipation   Varenicline Tartrate     REACTION: vivid dreams, nausea    Current Outpatient Medications on File Prior to Visit  Medication Sig Dispense Refill   acetaminophen (TYLENOL) 500 MG tablet Take 500 mg by mouth every 6 (six) hours as needed.     allopurinol (ZYLOPRIM) 300 MG tablet TAKE 1 TABLET(300 MG) BY MOUTH DAILY 90 tablet 1   Ascorbic Acid (VITAMIN C) 1000 MG tablet Take 1,000 mg by mouth daily.     aspirin EC 81 MG tablet Take 81 mg by mouth daily. Swallow whole.     bisacodyl (DULCOLAX) 5 MG EC tablet Take 5 mg by mouth daily as needed for moderate constipation.     brimonidine (ALPHAGAN) 0.2 % ophthalmic solution Place into both eyes 2 (two) times daily.     carvedilol (COREG) 6.25 MG tablet Take 1 tablet (6.25 mg total) by mouth 2 (two) times daily. 60 tablet 6   Cholecalciferol (VITAMIN D) 50 MCG (2000 UT) CAPS Take 2,000 Units by mouth daily at 2 PM.     furosemide (LASIX) 20 MG tablet Take 20 mg by mouth every other day.     ipratropium (ATROVENT) 0.06 % nasal spray USE 2 SPRAYS IN EACH NOSTRIL EVERY DAY 15 mL 5   latanoprost (XALATAN) 0.005 % ophthalmic solution Place 1 drop into both eyes at bedtime.     Omega-3 Fatty Acids (FISH OIL) 1200 MG CAPS Take 1,200 mg by mouth daily.     saw palmetto 80 MG capsule Take 80 mg by mouth 2 (two) times daily.     simvastatin (ZOCOR) 20 MG tablet TAKE 1  TABLET(20 MG) BY MOUTH DAILY 90 tablet 3   No current facility-administered medications on file prior to visit.    BP 108/62  Pulse 66    Temp 98.6 F (37 C) (Temporal)    Ht 5\' 9"  (1.753 m)    Wt 152 lb (68.9 kg)    SpO2 94%    BMI 22.45 kg/m  Objective:   Physical Exam Constitutional:      General: He is not in acute distress.    Appearance: He is not ill-appearing.  Pulmonary:     Effort: Pulmonary effort is normal.  Musculoskeletal:     Right hand: Swelling present. No bony tenderness. Decreased range of motion. Normal strength.       Hands:  Skin:    General: Skin is warm and dry.     Findings: Erythema present.     Comments: Moderate erythema to left second metacarpal joint with pinpoint open wound. No drainage. Mild warmth.   Neurological:     Mental Status: He is alert.          Assessment & Plan:    This visit occurred during the SARS-CoV-2 public health emergency.  Safety protocols were in place, including screening questions prior to the visit, additional usage of staff PPE, and extensive cleaning of exam room while observing appropriate contact time as indicated for disinfecting solutions.

## 2021-11-07 NOTE — Patient Instructions (Signed)
Start cephalexin antibiotics for the wound on your hand.  Take 1 capsule by mouth 4 times daily for 5 days.  Stop by the lab and xray prior to leaving today. I will notify you of your results once received.   Follow-up with Dr. Damita Dunnings as scheduled.  It was a pleasure to see you today!

## 2021-11-07 NOTE — Assessment & Plan Note (Signed)
Today's presentation is less representative of gout, especially given the opening in his skin.  Checking uric acid level today.  Continue allopurinol 300 mg daily.

## 2021-11-07 NOTE — Telephone Encounter (Signed)
Patient already has appt on 11/13/21 at 3:00 pm

## 2021-11-07 NOTE — Telephone Encounter (Signed)
Please set up OV when possible with me so we can talk about restarting coumadin.  Thanks.

## 2021-11-07 NOTE — Assessment & Plan Note (Addendum)
Today's exam and HPI are representative of cellulitis more so than gout or osteomyelitis.  See picture under media tab in chart  There was no obvious foreign body on exam, discussed this with patient today.  Encouraged him to stop squeezing the site of the wound.  Discussed proper dressing.  Need to rule out septic joint gout. Labs pending for CBC with differential and uric acid. X-ray of the left hand pending.  We will treat for presumed infectious cause with cephalexin 500 mg 4 times daily x5 days.  We will have him follow-up with his PCP next week for reevaluation.

## 2021-11-13 ENCOUNTER — Ambulatory Visit (INDEPENDENT_AMBULATORY_CARE_PROVIDER_SITE_OTHER): Payer: Medicare Other | Admitting: Family Medicine

## 2021-11-13 ENCOUNTER — Encounter: Payer: Self-pay | Admitting: Family Medicine

## 2021-11-13 ENCOUNTER — Other Ambulatory Visit: Payer: Self-pay

## 2021-11-13 DIAGNOSIS — L03012 Cellulitis of left finger: Secondary | ICD-10-CM

## 2021-11-13 DIAGNOSIS — I482 Chronic atrial fibrillation, unspecified: Secondary | ICD-10-CM

## 2021-11-13 MED ORDER — CEPHALEXIN 500 MG PO CAPS: 500.0000 mg | ORAL_CAPSULE | Freq: Four times a day (QID) | ORAL | 0 refills | Status: AC

## 2021-11-13 MED ORDER — CARVEDILOL 6.25 MG PO TABS: 3.1250 mg | ORAL_TABLET | Freq: Two times a day (BID) | ORAL | Status: DC

## 2021-11-13 MED ORDER — SAW PALMETTO (SERENOA REPENS) 80 MG PO CAPS: 80.0000 mg | ORAL_CAPSULE | Freq: Every day | ORAL | Status: DC

## 2021-11-13 NOTE — Patient Instructions (Addendum)
Check your carvedilol and saw palmetto dose at home.    Restart the keflex (antibiotic) and I'll check with the coumadin clinic.   Take care.  Glad to see you.

## 2021-11-13 NOTE — Progress Notes (Signed)
This visit occurred during the SARS-CoV-2 public health emergency.  Safety protocols were in place, including screening questions prior to the visit, additional usage of staff PPE, and extensive cleaning of exam room while observing appropriate contact time as indicated for disinfecting solutions.  He has more bruising off coumadin and on aspirin.  We talked about options.  He is still on aspirin 81mg  but bruising.  No bleeding.  He had less bruising on coumadin.  No blood in urine or BMs.  L>R ankle edema, not noted while on coumadin.  Only noted since change to aspirin.  Follow-up regarding his hand.  L 2nd MCP still pinkish but not as painful.   He restarted smoking 1 pack per week after his wife died.  D/w pt. condolences offered.  We clarified and updated his med list.    Meds, vitals, and allergies reviewed.   ROS: Per HPI unless specifically indicated in ROS section   Nad Ncat Neck supple, no LA IRR Ctab Abd soft, not ttp L>R ankle edema noted, mild.   L 2nd MCP with local irritation but no FB seen, no fluctuance.    32 minutes were devoted to patient care in this encounter (this includes time spent reviewing the patient's file/history, interviewing and examining the patient, counseling/reviewing plan with patient).

## 2021-11-19 ENCOUNTER — Telehealth: Payer: Medicare Other

## 2021-11-19 ENCOUNTER — Telehealth: Payer: Self-pay

## 2021-11-19 ENCOUNTER — Telehealth: Payer: Self-pay | Admitting: Family Medicine

## 2021-11-19 DIAGNOSIS — Z7901 Long term (current) use of anticoagulants: Secondary | ICD-10-CM

## 2021-11-19 NOTE — Assessment & Plan Note (Signed)
This looks to be improved but not fully resolved.  He does not have circumferential erythema.  Would restart Keflex and he can update me as needed. ?

## 2021-11-19 NOTE — Progress Notes (Signed)
? ? ?Chronic Care Management ?Pharmacy Assistant  ? ?Name: Benjamin Soto  MRN: [id number] DOB: July 05, 1936 ? ?Reason for Encounter: CCM (General Adherence) ?  ?Recent office visits:  ?11/13/2021 - Benjamin Stain, MD - Patient presented for cellulitis of finger of left hand. Change: carvedilol (COREG) 6.25 MG tablet - take 0.5 tablets by mouth 2 times daily. Change: saw palmetto 80 MG capsule 1 tab daily vs. 2 times daily.  ? ?Recent consult visits:  ?11/07/2021 - Benjamin Friendly, MD - Internal Medicine - Patient presented for cellulitis of finger of left hand. Lab work: "Blood counts do not show infection. Gout is well controlled; swelling in not from gout". Start: cephALEXin (KEFLEX) 500 MG capsule.  ?11/04/2021 - Benjamin Rogue, MD - Cardiology - Patient presented for paroxysmal SVT. Ordered: EKG. Start: Furosemide:  (LASIX) 20 MG tablet - every other day.  ? ?Hospital visits:  ?None in previous 6 months ? ?Medications: ?Outpatient Encounter Medications as of 11/19/2021  ?Medication Sig  ? acetaminophen (TYLENOL) 500 MG tablet Take 500 mg by mouth every 6 (six) hours as needed.  ? allopurinol (ZYLOPRIM) 300 MG tablet TAKE 1 TABLET(300 MG) BY MOUTH DAILY  ? Ascorbic Acid (VITAMIN C) 1000 MG tablet Take 1,000 mg by mouth daily.  ? aspirin EC 81 MG tablet Take 81 mg by mouth daily. Swallow whole.  ? bisacodyl (DULCOLAX) 5 MG EC tablet Take 5 mg by mouth daily as needed for moderate constipation.  ? brimonidine (ALPHAGAN) 0.2 % ophthalmic solution Place into both eyes 2 (two) times daily.  ? carvedilol (COREG) 6.25 MG tablet Take 0.5 tablets (3.125 mg total) by mouth 2 (two) times daily.  ? Cholecalciferol (VITAMIN D) 50 MCG (2000 UT) CAPS Take 2,000 Units by mouth daily at 2 PM.  ? furosemide (LASIX) 20 MG tablet Take 20 mg by mouth every other day.  ? ipratropium (ATROVENT) 0.06 % nasal spray USE 2 SPRAYS IN EACH NOSTRIL EVERY DAY  ? latanoprost (XALATAN) 0.005 % ophthalmic solution Place 1 drop into both eyes at  bedtime.  ? Omega-3 Fatty Acids (FISH OIL) 1200 MG CAPS Take 1,200 mg by mouth daily.  ? saw palmetto 80 MG capsule Take 1 capsule (80 mg total) by mouth daily.  ? simvastatin (ZOCOR) 20 MG tablet TAKE 1 TABLET(20 MG) BY MOUTH DAILY  ? ?No facility-administered encounter medications on file as of 11/19/2021.  ? ?Contacted [name] on 11/19/2021 for general disease state and medication adherence call.  ? ?Patient is not more than 5 days past due for refill on the following medications per chart history: ? ?[facility] Medications: ?Medication Name/mg Last Fill Days Supply ?Simvastatin 20 mg  10/12/2021 90   ? ?What concerns do you have about your medications? Patient is still having swelling and would like to know from Dr. Damita Dunnings if he can try taking Furosemide 20 mg tablet every day instead of every other.   On 11/04/2021 - Benjamin Rogue, MD - Cardiology - Patient presented for paroxysmal SVT and was started on furosemide (LASIX) 20 MG tablet - every other day.  ? ?The patient denies side effects with their medications.  ? ?How often do you forget or accidentally miss a dose? Rarely ? ?Do you use a pillbox? No ? ?Are you having any problems getting your medications from your pharmacy? No ? ?Has the cost of your medications been a concern? No ? ?Since last visit with CPP, the following interventions have been made.  ?Change: carvedilol (COREG) 6.25 MG tablet -  take 0.5 tablets by mouth 2 times daily.  ?Change: saw palmetto 80 MG capsule 1 tab daily vs. 2 times daily.  ?Start: cephALEXin (KEFLEX) 500 MG capsule. ?Start: Furosemide: (LASIX) 20 MG tablet - every other day. ? ?The patient has not had an ED visit since last contact.  ? ?The patient denies problems with their health.  ? ?Patient denies concerns or questions for Charlene Brooke, PharmD at this time.  ? ?Care Gaps: ?Annual wellness visit in last year? Yes 05/22/2021 ?Most Recent BP reading: 126/86 on 11/13/2021 ? ?Upcoming appointments: ?No appointments  scheduled within the next 30 days. ? ?Charlene Brooke, CPP notified ? ?Marijean Niemann, RMA ?Clinical Pharmacy Assistant ?[phone] ? ?Time Spent: 40 Minutes ? ? ? ? ? ? ? ?

## 2021-11-19 NOTE — Telephone Encounter (Signed)
This patient would like to transition from aspirin back to Coumadin and I would like you to help me get this set up.  He has also talked to cardiology about it.  Thanks. ?

## 2021-11-19 NOTE — Assessment & Plan Note (Signed)
I think it makes sense to continue aspirin for now but I will check with anticoagulation clinic about making transition back to Coumadin.  Risks and benefits of using aspirin versus Coumadin discussed with patient. ?

## 2021-11-20 ENCOUNTER — Telehealth: Payer: Self-pay

## 2021-11-20 NOTE — Telephone Encounter (Signed)
Patient reports persistent swelling despite new addition for furosemide 20 mg every other day (per cardiology 11/04/21 - furosemide was reduced to QOD due to bump in creatinine on 20 mg daily). He asked if he could increase furosemide back to daily. I advised against this and advised him to contact cardiology - this may be a good chance to start [place] for HF benefits. ? ?Lab Results  ?Component Value Date  ? CREATININE 1.62 (H) 10/27/2021  ? CREATININE 1.20 10/20/2021  ? CREATININE 1.55 (H) 05/22/2021  ? ? ?

## 2021-11-20 NOTE — Telephone Encounter (Signed)
Pt's last coumadin dose was in July 2021 and he was taking 5 mg daily except on Mondays and Fridays he was taking 2.5 mg.  ?

## 2021-11-20 NOTE — Telephone Encounter (Signed)
Message from [place], [place], [place]. Pt wanting to increase Lasix to 20 mg daily from QOD d/t increase swelling. ? ?Reach out to pt via phone, pt reports increase swelling to lower extremities from ankles to mid calf. Confirms taking lasix 20 mg QOD, tries to elevate legs when possible, wants to increase to 20 mg daily lasix.   ? ?Advised Dr. Donivan Scull last [place] notes from 2/14 ?Systolic CHF, chronic ([place]) ?Echocardiogram recently performed severely depressed ejection fraction 25 to 30% ? depressed ejection fraction, presumed to be nonischemic, possibly alcohol related ?Alcohol cessation recommended ?Previously declined ischemic work-up, catheterization ?Previously reported drinking lots of red wine on a daily basis ? ACE inhibitor, ARB previously held for hypotension and dizziness ?-Low-dose Lasix 20 mg every other day, cautious use in the setting of renal dysfunction ?Continue carvedilol ? ?Cannot advised to increase his dosage at this time d/t renal concerns, needs to be re-evaluated as he reports increase swelling since last visit a few weeks ago, may need medication adjustments or change.  ? ?Appt for 3/24 at 10:55 am with Christell Faith, PA-C, also placed on wait list in hopes for sooner opening. Pt thankful for appt, reports will stick to 20 mg lasix QOD for now, elevate legs, reduce salt and fluids. Otherwise all questions were address and no additional concerns at this time. Benjamin Soto thankful for the return call and advice. Agreeable to plan, will call back for anything further.   ? ?

## 2021-11-20 NOTE — Progress Notes (Signed)
I called patient to inform him that Charlene Brooke would like him to contact cardiology in regards to increasing Furosemide and taking it daily. Patient asked me to contact cardiology due to his hearing difficulties. I sent Mila Merry, RN for Dr. Rockey Situ a message through Epic asking her to contact the patient and what his request was.  ? ?Charlene Brooke, CPP notified ? ?Marijean Niemann, RMA ?Clinical Pharmacy Assistant ?[phone] ? ?Time Spent: 11 ? ?

## 2021-11-21 NOTE — Telephone Encounter (Addendum)
Will contact pt today to initiate warfarin after PCP authorization.  ?Kidney function has declined since the last time, July 2021, pt was taking warfarin. Pt is now experiencing "swelling", unsure what this is pertaining to and if it is edema or something else after reviewing notes. ?With decreased renal function and possible edema, recommended starting dose of warfarin at 2.5 mg daily except take 5 mg on Mondays, Wednesdays and Fridays. Check INR in clinic at [facility] next Thursday, 3/9, is recommended.  ?Will forward to PCP for recommendations.  ?

## 2021-11-21 NOTE — Telephone Encounter (Addendum)
Noted.  Appreciate cardiology input. ? ? ?====================== ?I called patient to inform him that Charlene Brooke would like him to contact cardiology in regards to increasing Furosemide and taking it daily. Patient asked me to contact cardiology due to his hearing difficulties. I sent Mila Merry, RN for Dr. Rockey Situ a message through Epic asking her to contact the patient and what his request was.  ?  ?Charlene Brooke, CPP notified ?  ?Marijean Niemann, RMA ?Clinical Pharmacy Assistant ?[phone] ?  ?Time Spent: 11 ?

## 2021-11-24 MED ORDER — WARFARIN SODIUM 2.5 MG PO TABS: ORAL_TABLET | ORAL | 0 refills | Status: DC

## 2021-11-24 NOTE — Telephone Encounter (Signed)
Advised pt to stop aspirin and that warfarin would be sent in for 2.5 mg tablets. Advised to take 2 tablets today, 1 tablet tomorrow, and 2 tablets on Wednesday. Made pt apt for coumadin clinic for Thursday, 3/9 at 9 am. Pt wrote instructions down and read back instructions. Pt did not have any further questions. Advised if any problems to contact the coumadin clinic. Pt verbalized understanding.  ? ?Placed pt on schedule for 3/9. ? ?Sent in warfarin script to pt's requested pharmacy. ?

## 2021-11-24 NOTE — Telephone Encounter (Signed)
I agree with your recommendation to start Coumadin with the dosing listed.  He had less bruising and edema when he was on Coumadin and the hope is that both will improve with the change.  Thanks. ?

## 2021-11-24 NOTE — Telephone Encounter (Signed)
LVM for pt to contact coumadin clinic. ? ?Will use 2.5 mg tablet, so pt will take 1 tablet daily except take 2 tablets on Mondays, Wednesdays and Fridays. Need to schedule pt for 3/9 this week for INR check. ?

## 2021-11-24 NOTE — Telephone Encounter (Addendum)
Pt called to report he received an email from [facility] and there seems to be a problem with his warfarin.  ? ?Contacted [facility] and they advised it has been fixed and they are filling the medication. ? ?Contacted pt and advised they are filling and if any further problems to contact the coumadin clinic.  ? ?Pt reported he was still taking aspirin. Advised pt to stop aspirin. Pt appreciative and verbalized understanding.  ? ?[place] removed from medication list.  ?

## 2021-11-24 NOTE — Addendum Note (Signed)
Addended by: Randall An on: 11/24/2021 02:17 PM ? ? Modules accepted: Orders ? ?

## 2021-11-24 NOTE — Telephone Encounter (Signed)
Pt called the [facility] office asking for the coumadin nurse. I tried to tell him the message but he could not hear me. He said he does not have 2.5 mg tabs and needs them sent to [place] [address] [facility] [employer]. Needs to be called back about his regimen. ?

## 2021-11-27 ENCOUNTER — Ambulatory Visit (INDEPENDENT_AMBULATORY_CARE_PROVIDER_SITE_OTHER): Payer: Medicare Other

## 2021-11-27 ENCOUNTER — Other Ambulatory Visit: Payer: Self-pay

## 2021-11-27 DIAGNOSIS — Z7901 Long term (current) use of anticoagulants: Secondary | ICD-10-CM | POA: Diagnosis not present

## 2021-11-27 LAB — POCT INR: INR: 1.2 — AB (ref 2.0–3.0)

## 2021-11-27 NOTE — Patient Instructions (Addendum)
Pre visit review using our clinic review tool, if applicable. No additional management support is needed unless otherwise documented below in the visit note. ? ?Increase dose today to take 2 tablets, increase dose tomorrow to take 3 tablets and then change weekly dose to take 2 tablets daily except take 1 tablet on Sundays, Tuesdays and Thursdays. Recheck INR in 1 week. ?

## 2021-11-27 NOTE — Progress Notes (Signed)
Increase dose today to take 2 tablets, increase dose tomorrow to take 3 tablets and then change weekly dose to take 2 tablets daily except take 1 tablet on Sundays, Tuesdays and Thursdays. Recheck INR in 1 week. ?

## 2021-11-28 DIAGNOSIS — H905 Unspecified sensorineural hearing loss: Secondary | ICD-10-CM | POA: Diagnosis not present

## 2021-12-04 ENCOUNTER — Other Ambulatory Visit: Payer: Self-pay

## 2021-12-04 ENCOUNTER — Ambulatory Visit (INDEPENDENT_AMBULATORY_CARE_PROVIDER_SITE_OTHER): Payer: Medicare Other

## 2021-12-04 DIAGNOSIS — Z7901 Long term (current) use of anticoagulants: Secondary | ICD-10-CM | POA: Diagnosis not present

## 2021-12-04 LAB — POCT INR: INR: 2.9 (ref 2.0–3.0)

## 2021-12-04 NOTE — Patient Instructions (Addendum)
Pre visit review using our clinic review tool, if applicable. No additional management support is needed unless otherwise documented below in the visit note. ? ?Continue 2 tablets daily except take 1 tablet on Sundays, Tuesdays and Thursdays. Recheck INR in 1 week. ?

## 2021-12-04 NOTE — Progress Notes (Signed)
Continue 2 tablets daily except take 1 tablet on Sundays, Tuesdays and Thursdays. Recheck INR in 1 week. ?

## 2021-12-06 ENCOUNTER — Other Ambulatory Visit: Payer: Self-pay | Admitting: Family Medicine

## 2021-12-06 DIAGNOSIS — Z7901 Long term (current) use of anticoagulants: Secondary | ICD-10-CM

## 2021-12-08 ENCOUNTER — Other Ambulatory Visit: Payer: Self-pay | Admitting: Family Medicine

## 2021-12-08 DIAGNOSIS — Z7901 Long term (current) use of anticoagulants: Secondary | ICD-10-CM

## 2021-12-08 NOTE — Telephone Encounter (Signed)
Pt is compliant with coumadin clinic apts and PCP apts. ?Sent in refill. ?

## 2021-12-11 ENCOUNTER — Ambulatory Visit (INDEPENDENT_AMBULATORY_CARE_PROVIDER_SITE_OTHER): Payer: Medicare Other

## 2021-12-11 ENCOUNTER — Other Ambulatory Visit: Payer: Self-pay

## 2021-12-11 DIAGNOSIS — Z7901 Long term (current) use of anticoagulants: Secondary | ICD-10-CM | POA: Diagnosis not present

## 2021-12-11 LAB — POCT INR: INR: 4.1 — AB (ref 2.0–3.0)

## 2021-12-11 NOTE — Patient Instructions (Addendum)
Pre visit review using our clinic review tool, if applicable. No additional management support is needed unless otherwise documented below in the visit note. ? ?Hold dose today and reduce dose tomorrow to take 1 tablet and then change weekly dose to take 1 tablet daily except take 2 tablets on Mondays and Thursdays. Recheck INR in 1 week. ? ? ?

## 2021-12-11 NOTE — Progress Notes (Signed)
? ?Cardiology Office Note   ? ?Date:  12/12/2021  ? ?ID:  Benjamin Soto, DOB 12-20-1935, MRN [id number] ? ?PCP:  Tonia Ghent, MD  ?Cardiologist:  Ida Rogue, MD  ?Electrophysiologist:  None  ? ?Chief Complaint: Lower extremity swelling ? ?History of Present Illness:  ? ?Benjamin Soto is a 86 y.o. male with history of nonobstructive CAD by LHC in 2014, permanent A-fib previously not on anticoagulation due to prior [facility] in 8/21, now back on warfarin as of 12/08/2021, dilated nonischemic cardiomyopathy, paroxysmal SVT, pulmonary hypertension, diabetes, renal dysfunction, tobacco use, alcohol use, chronic lower extremity swelling, and arthritis who presents for evaluation of persistent lower extremity swelling. ? ?Remote echo in 2013 demonstrated an EF of 25 to 30%, diffuse hypokinesis, grade 1 diastolic dysfunction, mild aortic insufficiency, moderate, possibly severe mitral regurgitation, moderately dilated left atrium, mildly dilated RV with mildly reduced RV systolic function, moderately dilated right atrium, and an elevated PASP estimated at 55 mmHg.  Lexiscan MPI in 05/2013 demonstrated a moderate in size anteroseptal perfusion defect with an EF of 26%, and was overall high risk.  LHC in 06/2013 showed nonobstructive disease with 50% stenosis in the mid LCx and 40% stenosis in the proximal RCA.  Repeat echo in 06/2013 demonstrated a persistent cardiomyopathy with an EF of 20 to 25%, global hypokinesis, moderate LVH, mild to moderate dilatation of the internal LV cavity size, moderate biatrial enlargement, moderate mitral valve regurgitation, and mild aortic valve sclerosis without evidence of stenosis.  Due to his persistent cardiomyopathy, he was evaluated by EP in 03/2014, for discussion of possible ICD.  As part of this work-up, he underwent CPX in 04/2014 which demonstrated a moderate to severe functional limitation with primary circulatory limitation due to advanced heart failure.  EP recommended as  needed follow-up. ? ?More recently, he was seen on 10/20/2021 with 1 month history of progressive lower extremity swelling that worsened throughout the day and was noted to be improved in the morning.  He was not wearing compression stockings.  He noted missing some doses of every other day furosemide.  His weight was down 32 pounds when compared to his last prior visit in 02/2020 with a trend of 186 to 154 pounds.  It was recommended to increase Lasix to 20 mg daily and increase carvedilol to 6.25 mg twice daily.  Echo obtained that same day demonstrated an EF of 25 to 30%, global hypokinesis, mild LVH, indeterminate LV diastolic function parameters, low normal RV systolic function, moderately enlarged RV cavity size, moderately elevated PASP estimated at 59.1 mmHg, severe biatrial enlargement, degenerative mitral valve with moderate regurgitation and moderate mitral annular calcification, moderate tricuspid regurgitation, mild aortic insufficiency, and an estimated right atrial pressure of 15 mmHg.  Follow-up labs demonstrated a BUN and SCr of 39/1.62 (range approximately 1.2-1.6) respectively with recommendation to decrease Lasix to 10 mg daily.  He declined and preferred to remain on furosemide 20 mg every other day.  He was most recently evaluated by his primary cardiologist on 11/04/2020 with a weight that was down another 2 pounds when compared to his visit the prior month, at 152 pounds.  It was recommended he continue low-dose Lasix every other day with cautious use in the setting of his underlying renal dysfunction.  It was documented he is drinking "lots of red wine daily."  He remained off anticoagulation.  Note indicates primary cardiologist was going to discuss this with the patient's PCP, and it appears patient has subsequently been initiated on  warfarin by PCP. ? ?He called on 3/2 noting continued lower extremity swelling with recommendation for patient to be evaluated in the office today. ? ?He comes  in today and is doing well from a cardiac perspective.  He indicates he is not sure why this appointment was scheduled.  His bilateral ankle edema is stable.  The swelling is resolved in the mornings and typically progresses throughout the day when he does not elevate his legs.  He is not wearing compression stockings.  He denies any chest pain, dyspnea, palpitations, dizziness, presyncope, syncope, orthopnea, PND, abdominal distention, or early satiety.  He watches his salt intake and drinks less than 2 L of fluid per day.  His weight remains stable by our scale.  He has not had any falls, hematochezia, or melena.  Overall, he feels like he is doing well. ? ? ?Labs independently reviewed: ?12/04/2021 - INR 2.9 ?10/2021 - Hgb 12.8, PLT 178, BUN 39, serum creatinine 1.62, potassium 4.8 ?09/2021 - TSH normal, albumin 4.1, AST/ALT normal ?04/2021 - TC 109, TG 98, HDL 53, LDL 35, A1c 5.6 ? ? ?Past Medical History:  ?Diagnosis Date  ? Anticoagulated on Coumadin   ? CAD (coronary artery disease)   ? a. 06/2013 Cath: LM nl, LAD nl, LCX 3m RCA 40p, EF 25-30%.  ? Diabetes mellitus, type 2 ([place] 09/21/1992  ? Elbow fracture, right 2014  ? "healed by itself"  ? Gout 04/22/2003  ? per Dr KJefm Bryant ? HFrEF (heart failure with reduced ejection fraction) ([place]   ? a.  02/2014 Echo: EF 20-25%, diff HK; b. 04/2014 CPX test: primary cirulatory limitation 2/2 advanced CHF.  ? History of colon polyps 10/26/2002  ? colonoscopy  ? History of GI bleed   ? a. [facility] in origin. Resolved off Coumadin. Cleared to resume per GI.  ? History of tobacco abuse   ? Hyperlipidemia 05/22/2001  ? Hypertension   ? Hypogonadism male   ? Knee pain, right   ? [facility], Dr KJefm Bryantwith rheumatology  ? Mitral regurgitation   ? a. 02/2014 Echo: mild to mod MR.  ? Nonischemic cardiomyopathy ([place]   ? a. 06/2013 Cath: nonobs dzs, EF 20-25; b. 02/2014 Echo: EF 20-25%, diff HK, mild AI, mild to mod MR, mod  dil LA, mildly reduced RV fxn, mildly dil RA, PASP wnl.  ? Permanent atrial fibrillation ([place] 01/20/2003  ? a. CHA2DS2VASc = 7-->coumadin.  ? TIA (transient ischemic attack)   ? a. 07/2013 Admitted w/ transient AMS->No stroke;  b. 08/2013 Carotid U/S: <50% bilat ICA stenosis.  ? ? ?Past Surgical History:  ?Procedure Laterality Date  ? APPENDECTOMY    ? BUNIONECTOMY Right 02/2004  ? R MTP  ? [facility] ? CARDIAC CATHETERIZATION  10/14  ? [facility]- 50% mid LCx, 40% prox RCA  ? CYST EXCISION  08/07/2013  ? head  ? TONSILLECTOMY    ? ? ?Current Medications: ?Current Meds  ?Medication Sig  ? acetaminophen (TYLENOL) 500 MG tablet Take 500 mg by mouth every 6 (six) hours as needed.  ? allopurinol (ZYLOPRIM) 300 MG tablet TAKE 1 TABLET(300 MG) BY MOUTH DAILY  ? Ascorbic Acid (VITAMIN C) 1000 MG tablet Take 1,000 mg by mouth daily.  ? bisacodyl (DULCOLAX) 5 MG EC tablet Take 5 mg by mouth daily as needed for moderate constipation.  ? brimonidine (ALPHAGAN) 0.2 % ophthalmic  solution Place into both eyes 2 (two) times daily.  ? carvedilol (COREG) 6.25 MG tablet Take 0.5 tablets (3.125 mg total) by mouth 2 (two) times daily.  ? Cholecalciferol (VITAMIN D) 50 MCG (2000 UT) CAPS Take 2,000 Units by mouth daily at 2 PM.  ? furosemide (LASIX) 20 MG tablet Take 20 mg by mouth every other day.  ? ipratropium (ATROVENT) 0.06 % nasal spray USE 2 SPRAYS IN EACH NOSTRIL EVERY DAY  ? latanoprost (XALATAN) 0.005 % ophthalmic solution Place 1 drop into both eyes at bedtime.  ? Omega-3 Fatty Acids (FISH OIL) 1200 MG CAPS Take 1,200 mg by mouth daily.  ? saw palmetto 160 MG capsule Take 160 mg by mouth daily.  ? saw palmetto 80 MG capsule Take 1 capsule (80 mg total) by mouth daily.  ? simvastatin (ZOCOR) 20 MG tablet TAKE 1 TABLET(20 MG) BY MOUTH DAILY  ? warfarin (COUMADIN) 2.5 MG tablet TAKE 2 TABLETS BY MOUTH DAILY EXCEPT TAKE 1 TABLET ON SUNDAYS, TUESDAYS AND THURSDAYS OR AS DIRECTED BY  COAGULATION CLINIC  ? ? ?Allergies:   Lisinopril, Tramadol, and Varenicline tartrate  ? ?Social History  ? ?Socioeconomic History  ? Marital status: Married  ?  Spouse name: Not on file  ? Number of children: 2

## 2021-12-11 NOTE — Progress Notes (Addendum)
Hold dose today and reduce dose tomorrow to take 1 tablet and then change weekly dose to take 1 tablet daily except take 2 tablets on Mondays and Thursdays. Recheck INR in 1 week. ?Advised if any signs or symptoms of bleeding to contact the office or go to the ER. Pt verbalized understanding.  ?Pt reported more bruising than normal but no signs or symptoms of bleeding. ?

## 2021-12-12 ENCOUNTER — Encounter: Payer: Self-pay | Admitting: Physician Assistant

## 2021-12-12 ENCOUNTER — Ambulatory Visit: Payer: Medicare Other | Admitting: Physician Assistant

## 2021-12-12 VITALS — BP 132/80 | HR 81 | Ht 69.0 in | Wt 152.6 lb

## 2021-12-12 DIAGNOSIS — E785 Hyperlipidemia, unspecified: Secondary | ICD-10-CM | POA: Diagnosis not present

## 2021-12-12 DIAGNOSIS — I2510 Atherosclerotic heart disease of native coronary artery without angina pectoris: Secondary | ICD-10-CM

## 2021-12-12 DIAGNOSIS — I471 Supraventricular tachycardia: Secondary | ICD-10-CM

## 2021-12-12 DIAGNOSIS — I272 Pulmonary hypertension, unspecified: Secondary | ICD-10-CM

## 2021-12-12 DIAGNOSIS — I4821 Permanent atrial fibrillation: Secondary | ICD-10-CM | POA: Diagnosis not present

## 2021-12-12 DIAGNOSIS — I420 Dilated cardiomyopathy: Secondary | ICD-10-CM | POA: Diagnosis not present

## 2021-12-12 DIAGNOSIS — Z8679 Personal history of other diseases of the circulatory system: Secondary | ICD-10-CM

## 2021-12-12 DIAGNOSIS — N289 Disorder of kidney and ureter, unspecified: Secondary | ICD-10-CM | POA: Diagnosis not present

## 2021-12-12 DIAGNOSIS — I10 Essential (primary) hypertension: Secondary | ICD-10-CM | POA: Diagnosis not present

## 2021-12-12 DIAGNOSIS — M25473 Effusion, unspecified ankle: Secondary | ICD-10-CM

## 2021-12-12 DIAGNOSIS — I428 Other cardiomyopathies: Secondary | ICD-10-CM

## 2021-12-12 DIAGNOSIS — I38 Endocarditis, valve unspecified: Secondary | ICD-10-CM

## 2021-12-12 DIAGNOSIS — I5022 Chronic systolic (congestive) heart failure: Secondary | ICD-10-CM | POA: Diagnosis not present

## 2021-12-12 NOTE — Patient Instructions (Signed)
Please pick up some compression socks/hose. These are sold at most pharmacies and there are some at [facility].  ? ?Medication Instructions:  ?No changes at this time.  ? ?*If you need a refill on your cardiac medications before your next appointment, please call your pharmacy* ? ? ?Lab Work: ?BMET today ? ?If you have labs (blood work) drawn today and your tests are completely normal, you will receive your results only by: ?MyChart Message (if you have MyChart) OR ?A paper copy in the mail ?If you have any lab test that is abnormal or we need to change your treatment, we will call you to review the results. ? ? ?Testing/Procedures: ?None ? ? ?Follow-Up: ?At [facility], you and your health needs are our priority.  As part of our continuing mission to provide you with exceptional heart care, we have created designated Provider Care Teams.  These Care Teams include your primary Cardiologist (physician) and Advanced Practice Providers (APPs -  Physician Assistants and Nurse Practitioners) who all work together to provide you with the care you need, when you need it. ? ? ?Your next appointment:   ?6 month(s) ? ?The format for your next appointment:   ?In Person ? ?Provider:   ?You may see Ida Rogue, MD or one of the following Advanced Practice Providers on your designated Care Team:   ?Murray Hodgkins, NP ?Christell Faith, PA-C ?Cadence Kathlen Mody, PA-C{ ?

## 2021-12-13 LAB — BASIC METABOLIC PANEL
BUN/Creatinine Ratio: 23 (ref 10–24)
BUN: 33 mg/dL — ABNORMAL HIGH (ref 8–27)
CO2: 21 mmol/L (ref 20–29)
Calcium: 8.8 mg/dL (ref 8.6–10.2)
Chloride: 99 mmol/L (ref 96–106)
Creatinine, Ser: 1.45 mg/dL — ABNORMAL HIGH (ref 0.76–1.27)
Glucose: 90 mg/dL (ref 70–99)
Potassium: 5.6 mmol/L — ABNORMAL HIGH (ref 3.5–5.2)
Sodium: 141 mmol/L (ref 134–144)
eGFR: 48 mL/min/{1.73_m2} — ABNORMAL LOW (ref >59)

## 2021-12-15 ENCOUNTER — Other Ambulatory Visit
Admission: RE | Admit: 2021-12-15 | Discharge: 2021-12-15 | Disposition: A | Discharge status: Home / Self Care | Payer: Medicare Other | Attending: Physician Assistant | Admitting: Physician Assistant

## 2021-12-15 ENCOUNTER — Telehealth: Payer: Self-pay | Admitting: Physician Assistant

## 2021-12-15 DIAGNOSIS — E875 Hyperkalemia: Secondary | ICD-10-CM | POA: Insufficient documentation

## 2021-12-15 LAB — BASIC METABOLIC PANEL
Anion gap: 9 (ref 5–15)
BUN: 28 mg/dL — ABNORMAL HIGH (ref 8–23)
CO2: 28 mmol/L (ref 22–32)
Calcium: 8.9 mg/dL (ref 8.9–10.3)
Chloride: 101 mmol/L (ref 98–111)
Creatinine, Ser: 1.39 mg/dL — ABNORMAL HIGH (ref 0.61–1.24)
GFR, Estimated: 50 mL/min — ABNORMAL LOW (ref >60)
Glucose, Bld: 101 mg/dL — ABNORMAL HIGH (ref 70–99)
Potassium: 4.4 mmol/L (ref 3.5–5.1)
Sodium: 138 mmol/L (ref 135–145)

## 2021-12-15 NOTE — Telephone Encounter (Signed)
Patient made aware of lab results and Christell Faith, PA recommendation. ? ?[place] a repeat bmp. ?Pt denies using salt substitute products and will discuss the use of saw palmetto with his pcp. ?

## 2021-12-15 NOTE — Telephone Encounter (Signed)
Christell Faith M, PA-C  P Cv Div [place] Triage ?Potassium is mildly elevated  ?Renal function is elevated, though consistent with prior readings  ? ?Recommendations:  ?-[place] repeat potassium today  ?-Ensure he is not using seasonings or salt substitutes that are high in potassium  ?-Saw palmetto may be contributing to elevated potassium level, recommend he stop this supplement and discuss further treatment options with his PCP/Urology  ?

## 2021-12-15 NOTE — Telephone Encounter (Signed)
Called to give the patient lab results. ?DPR lmom with lab results. Patient is to contact the office if any questions. ?

## 2021-12-15 NOTE — Telephone Encounter (Signed)
-----   Message from Rise Mu, [place] sent at 12/15/2021 12:08 PM EDT ----- ?Please inform patient potassium is improved, now normal.  Renal function is also slightly improved and consistent with prior readings. ?

## 2021-12-15 NOTE — Telephone Encounter (Signed)
Called to give the patient lab results and Christell Faith, PA recommendation. Lmtcb. ?

## 2021-12-18 ENCOUNTER — Ambulatory Visit (INDEPENDENT_AMBULATORY_CARE_PROVIDER_SITE_OTHER): Payer: Medicare Other

## 2021-12-18 DIAGNOSIS — Z7901 Long term (current) use of anticoagulants: Secondary | ICD-10-CM | POA: Diagnosis not present

## 2021-12-18 LAB — POCT INR: INR: 2.1 (ref 2.0–3.0)

## 2021-12-18 NOTE — Progress Notes (Signed)
Continue 1 tablet daily except take 2 tablets on Mondays and Thursdays. Recheck INR in 1 week.  ?

## 2021-12-18 NOTE — Patient Instructions (Addendum)
Pre visit review using our clinic review tool, if applicable. No additional management support is needed unless otherwise documented below in the visit note. ? ?Continue 1 tablet daily except take 2 tablets on Mondays and Thursdays. Recheck INR in 1 week.  ?

## 2021-12-25 ENCOUNTER — Ambulatory Visit (INDEPENDENT_AMBULATORY_CARE_PROVIDER_SITE_OTHER): Payer: Medicare Other

## 2021-12-25 DIAGNOSIS — Z7901 Long term (current) use of anticoagulants: Secondary | ICD-10-CM

## 2021-12-25 LAB — POCT INR: INR: 2.6 (ref 2.0–3.0)

## 2021-12-25 NOTE — Patient Instructions (Addendum)
Pre visit review using our clinic review tool, if applicable. No additional management support is needed unless otherwise documented below in the visit note. ? ?Continue 1 tablet daily except take 2 tablets on Mondays and Thursdays. Recheck INR in 2 week.  ?

## 2021-12-25 NOTE — Progress Notes (Signed)
Continue 1 tablet daily except take 2 tablets on Mondays and Thursdays. Recheck INR in 2 week.  ?

## 2022-01-02 DIAGNOSIS — M25552 Pain in left hip: Secondary | ICD-10-CM | POA: Diagnosis not present

## 2022-01-02 DIAGNOSIS — M76892 Other specified enthesopathies of left lower limb, excluding foot: Secondary | ICD-10-CM | POA: Diagnosis not present

## 2022-01-02 DIAGNOSIS — M7062 Trochanteric bursitis, left hip: Secondary | ICD-10-CM | POA: Diagnosis not present

## 2022-01-02 DIAGNOSIS — M1612 Unilateral primary osteoarthritis, left hip: Secondary | ICD-10-CM | POA: Diagnosis not present

## 2022-01-02 DIAGNOSIS — E118 Type 2 diabetes mellitus with unspecified complications: Secondary | ICD-10-CM | POA: Diagnosis not present

## 2022-01-08 ENCOUNTER — Ambulatory Visit (INDEPENDENT_AMBULATORY_CARE_PROVIDER_SITE_OTHER): Payer: Medicare Other

## 2022-01-08 DIAGNOSIS — Z7901 Long term (current) use of anticoagulants: Secondary | ICD-10-CM

## 2022-01-08 LAB — POCT INR: INR: 2.7 (ref 2.0–3.0)

## 2022-01-08 NOTE — Patient Instructions (Addendum)
Pre visit review using our clinic review tool, if applicable. No additional management support is needed unless otherwise documented below in the visit note. ? ?Continue 1 tablet daily except take 2 tablets on Mondays and Thursdays. Recheck INR in 3 week.  ?

## 2022-01-08 NOTE — Progress Notes (Signed)
Continue 1 tablet daily except take 2 tablets on Mondays and Thursdays. Recheck INR in 3 week.  ?

## 2022-01-15 ENCOUNTER — Other Ambulatory Visit: Payer: Self-pay | Admitting: Family Medicine

## 2022-01-19 ENCOUNTER — Telehealth: Payer: Self-pay

## 2022-01-19 NOTE — Telephone Encounter (Signed)
Pt returned call and reports the dose was lowered back to 3.125 after the apt in Jan because it was too much and his BP was getting too low.  ?He reports PCP has prescribed this medication in the past.  ?Pt reports he still has some 3.125 mg tablets for about 5 days. He reports he tried to split the larger dose but it crushed them and it was nothing but powder when he did.  ?Advised a msg will be sent to PCP to see if we can send in the 3.125 mg dose and this nurse will f/u with him to let him know. Pt would like sent ot [place], [place]. [place]. ?Pt verbalized understanding and was very appreciative.  ?

## 2022-01-19 NOTE — Telephone Encounter (Signed)
Pt LVM reporting he picked up a recent script for carvedilol, 6.25 mg, and is to take 1/2 tablet daily. He reports the tablets are too small for him to split. ?Pt is requesting a new script be sent to [facility] for the 3.125 mg dose.  ? ?This medication was last prescribed by cardiology and on visit on 10/20/21, Cadence Furth, PA, increased dose from 3.125 BID to 6.'25mg'$ .  ?Part of OV note on 10/20/21;  HTN ?BP elevated today, re-check 150/78. I will increase Coreg to 6.'25mg'$  BID. Lasix as above.  ? ? ?LVM for pt to return call to discuss medication.  ? ? ? ? ? ? ? ?

## 2022-01-20 MED ORDER — CARVEDILOL 3.125 MG PO TABS
3.1250 mg | ORAL_TABLET | Freq: Two times a day (BID) | ORAL | 1 refills | Status: DC
Start: 2022-01-20 — End: 2022-03-03

## 2022-01-20 NOTE — Telephone Encounter (Signed)
LVM letting pt know script has been sent to pharmacy.  ?

## 2022-01-20 NOTE — Telephone Encounter (Signed)
I went ahead and sent a prescription for carvedilol 3.125 mg twice a day.  He can monitor his blood pressure at home and we can adjust it later on again if needed.  I did not want him to run out.  Thanks. ?

## 2022-01-21 DIAGNOSIS — M12812 Other specific arthropathies, not elsewhere classified, left shoulder: Secondary | ICD-10-CM | POA: Diagnosis not present

## 2022-01-21 DIAGNOSIS — G8929 Other chronic pain: Secondary | ICD-10-CM | POA: Diagnosis not present

## 2022-01-21 DIAGNOSIS — M12811 Other specific arthropathies, not elsewhere classified, right shoulder: Secondary | ICD-10-CM | POA: Diagnosis not present

## 2022-01-21 DIAGNOSIS — M25512 Pain in left shoulder: Secondary | ICD-10-CM | POA: Diagnosis not present

## 2022-01-21 DIAGNOSIS — M25511 Pain in right shoulder: Secondary | ICD-10-CM | POA: Diagnosis not present

## 2022-01-29 ENCOUNTER — Ambulatory Visit (INDEPENDENT_AMBULATORY_CARE_PROVIDER_SITE_OTHER): Payer: Medicare Other

## 2022-01-29 DIAGNOSIS — Z7901 Long term (current) use of anticoagulants: Secondary | ICD-10-CM

## 2022-01-29 LAB — POCT INR: INR: 2.3 (ref 2.0–3.0)

## 2022-01-29 NOTE — Patient Instructions (Addendum)
Pre visit review using our clinic review tool, if applicable. No additional management support is needed unless otherwise documented below in the visit note. ? ?Continue 1 tablet daily except take 2 tablets on Mondays and Thursdays. Recheck INR in 4 week.  ?

## 2022-01-29 NOTE — Progress Notes (Addendum)
Continue 1 tablet daily except take 2 tablets on Mondays and Thursdays. Recheck INR in 4 week.  ? ?Pt requested that his med list be updated. It had saw palmetto twice on the list with two different doses. Removed the dose the pt was not taking.  ?

## 2022-02-18 ENCOUNTER — Other Ambulatory Visit: Payer: Self-pay | Admitting: Family Medicine

## 2022-02-18 DIAGNOSIS — Z7901 Long term (current) use of anticoagulants: Secondary | ICD-10-CM

## 2022-02-18 NOTE — Telephone Encounter (Signed)
Pt is compliant with warfarin management and PCP apts. ?Sent in refill.  ?

## 2022-02-26 ENCOUNTER — Other Ambulatory Visit: Payer: Self-pay

## 2022-02-26 ENCOUNTER — Emergency Department: Payer: Medicare Other

## 2022-02-26 ENCOUNTER — Ambulatory Visit (INDEPENDENT_AMBULATORY_CARE_PROVIDER_SITE_OTHER): Payer: Medicare Other

## 2022-02-26 ENCOUNTER — Inpatient Hospital Stay
Admission: EM | Admit: 2022-02-26 | Discharge: 2022-02-26 | DRG: 200 | Disposition: A | Discharge status: Other Acute Inpatient Hospital | Payer: Medicare Other | Attending: Emergency Medicine | Admitting: Emergency Medicine

## 2022-02-26 ENCOUNTER — Inpatient Hospital Stay (HOSPITAL_COMMUNITY)
Admission: AD | Admit: 2022-02-26 | Discharge: 2022-03-03 | DRG: 183 | Disposition: A | Discharge status: Home Health | Payer: Medicare Other | Source: Other Acute Inpatient Hospital | Attending: General Surgery | Admitting: General Surgery

## 2022-02-26 DIAGNOSIS — D62 Acute posthemorrhagic anemia: Secondary | ICD-10-CM | POA: Diagnosis present

## 2022-02-26 DIAGNOSIS — S2243XA Multiple fractures of ribs, bilateral, initial encounter for closed fracture: Secondary | ICD-10-CM | POA: Diagnosis not present

## 2022-02-26 DIAGNOSIS — Y939 Activity, unspecified: Secondary | ICD-10-CM | POA: Diagnosis not present

## 2022-02-26 DIAGNOSIS — I130 Hypertensive heart and chronic kidney disease with heart failure and stage 1 through stage 4 chronic kidney disease, or unspecified chronic kidney disease: Secondary | ICD-10-CM | POA: Diagnosis present

## 2022-02-26 DIAGNOSIS — Z888 Allergy status to other drugs, medicaments and biological substances status: Secondary | ICD-10-CM | POA: Diagnosis not present

## 2022-02-26 DIAGNOSIS — E1122 Type 2 diabetes mellitus with diabetic chronic kidney disease: Secondary | ICD-10-CM | POA: Diagnosis present

## 2022-02-26 DIAGNOSIS — I4891 Unspecified atrial fibrillation: Secondary | ICD-10-CM | POA: Diagnosis not present

## 2022-02-26 DIAGNOSIS — I700 Atherosclerosis of aorta: Secondary | ICD-10-CM | POA: Diagnosis not present

## 2022-02-26 DIAGNOSIS — Z79899 Other long term (current) drug therapy: Secondary | ICD-10-CM | POA: Diagnosis not present

## 2022-02-26 DIAGNOSIS — S2220XA Unspecified fracture of sternum, initial encounter for closed fracture: Secondary | ICD-10-CM | POA: Diagnosis present

## 2022-02-26 DIAGNOSIS — M109 Gout, unspecified: Secondary | ICD-10-CM | POA: Diagnosis present

## 2022-02-26 DIAGNOSIS — I428 Other cardiomyopathies: Secondary | ICD-10-CM | POA: Diagnosis not present

## 2022-02-26 DIAGNOSIS — E785 Hyperlipidemia, unspecified: Secondary | ICD-10-CM | POA: Diagnosis present

## 2022-02-26 DIAGNOSIS — Z885 Allergy status to narcotic agent status: Secondary | ICD-10-CM

## 2022-02-26 DIAGNOSIS — Z8673 Personal history of transient ischemic attack (TIA), and cerebral infarction without residual deficits: Secondary | ICD-10-CM | POA: Diagnosis not present

## 2022-02-26 DIAGNOSIS — I5022 Chronic systolic (congestive) heart failure: Secondary | ICD-10-CM | POA: Diagnosis present

## 2022-02-26 DIAGNOSIS — Y92410 Unspecified street and highway as the place of occurrence of the external cause: Secondary | ICD-10-CM | POA: Diagnosis not present

## 2022-02-26 DIAGNOSIS — I2510 Atherosclerotic heart disease of native coronary artery without angina pectoris: Secondary | ICD-10-CM | POA: Diagnosis present

## 2022-02-26 DIAGNOSIS — I340 Nonrheumatic mitral (valve) insufficiency: Secondary | ICD-10-CM | POA: Diagnosis present

## 2022-02-26 DIAGNOSIS — K5730 Diverticulosis of large intestine without perforation or abscess without bleeding: Secondary | ICD-10-CM | POA: Diagnosis not present

## 2022-02-26 DIAGNOSIS — S271XXA Traumatic hemothorax, initial encounter: Principal | ICD-10-CM | POA: Diagnosis present

## 2022-02-26 DIAGNOSIS — I10 Essential (primary) hypertension: Secondary | ICD-10-CM | POA: Diagnosis not present

## 2022-02-26 DIAGNOSIS — S2249XA Multiple fractures of ribs, unspecified side, initial encounter for closed fracture: Principal | ICD-10-CM | POA: Diagnosis present

## 2022-02-26 DIAGNOSIS — I517 Cardiomegaly: Secondary | ICD-10-CM | POA: Diagnosis not present

## 2022-02-26 DIAGNOSIS — M4312 Spondylolisthesis, cervical region: Secondary | ICD-10-CM | POA: Diagnosis not present

## 2022-02-26 DIAGNOSIS — Z7901 Long term (current) use of anticoagulants: Secondary | ICD-10-CM

## 2022-02-26 DIAGNOSIS — E43 Unspecified severe protein-calorie malnutrition: Secondary | ICD-10-CM | POA: Diagnosis present

## 2022-02-26 DIAGNOSIS — S2241XA Multiple fractures of ribs, right side, initial encounter for closed fracture: Principal | ICD-10-CM | POA: Diagnosis present

## 2022-02-26 DIAGNOSIS — I272 Pulmonary hypertension, unspecified: Secondary | ICD-10-CM | POA: Diagnosis not present

## 2022-02-26 DIAGNOSIS — Z6824 Body mass index (BMI) 24.0-24.9, adult: Secondary | ICD-10-CM

## 2022-02-26 DIAGNOSIS — I110 Hypertensive heart disease with heart failure: Secondary | ICD-10-CM | POA: Diagnosis not present

## 2022-02-26 DIAGNOSIS — E119 Type 2 diabetes mellitus without complications: Secondary | ICD-10-CM | POA: Diagnosis present

## 2022-02-26 DIAGNOSIS — I4821 Permanent atrial fibrillation: Secondary | ICD-10-CM | POA: Diagnosis present

## 2022-02-26 DIAGNOSIS — F17210 Nicotine dependence, cigarettes, uncomplicated: Secondary | ICD-10-CM | POA: Diagnosis present

## 2022-02-26 DIAGNOSIS — J8410 Pulmonary fibrosis, unspecified: Secondary | ICD-10-CM | POA: Diagnosis not present

## 2022-02-26 DIAGNOSIS — N182 Chronic kidney disease, stage 2 (mild): Secondary | ICD-10-CM | POA: Diagnosis not present

## 2022-02-26 DIAGNOSIS — S20219A Contusion of unspecified front wall of thorax, initial encounter: Secondary | ICD-10-CM | POA: Diagnosis present

## 2022-02-26 DIAGNOSIS — N200 Calculus of kidney: Secondary | ICD-10-CM | POA: Diagnosis not present

## 2022-02-26 DIAGNOSIS — N179 Acute kidney failure, unspecified: Secondary | ICD-10-CM | POA: Diagnosis present

## 2022-02-26 DIAGNOSIS — S2222XA Fracture of body of sternum, initial encounter for closed fracture: Secondary | ICD-10-CM

## 2022-02-26 DIAGNOSIS — S20212A Contusion of left front wall of thorax, initial encounter: Secondary | ICD-10-CM | POA: Diagnosis not present

## 2022-02-26 DIAGNOSIS — J90 Pleural effusion, not elsewhere classified: Secondary | ICD-10-CM | POA: Diagnosis not present

## 2022-02-26 DIAGNOSIS — Z833 Family history of diabetes mellitus: Secondary | ICD-10-CM | POA: Diagnosis not present

## 2022-02-26 DIAGNOSIS — R9431 Abnormal electrocardiogram [ECG] [EKG]: Secondary | ICD-10-CM | POA: Diagnosis not present

## 2022-02-26 DIAGNOSIS — M47812 Spondylosis without myelopathy or radiculopathy, cervical region: Secondary | ICD-10-CM | POA: Diagnosis not present

## 2022-02-26 DIAGNOSIS — K4090 Unilateral inguinal hernia, without obstruction or gangrene, not specified as recurrent: Secondary | ICD-10-CM | POA: Diagnosis not present

## 2022-02-26 DIAGNOSIS — S301XXA Contusion of abdominal wall, initial encounter: Secondary | ICD-10-CM | POA: Diagnosis not present

## 2022-02-26 DIAGNOSIS — S199XXA Unspecified injury of neck, initial encounter: Secondary | ICD-10-CM | POA: Diagnosis not present

## 2022-02-26 DIAGNOSIS — S0990XA Unspecified injury of head, initial encounter: Secondary | ICD-10-CM | POA: Diagnosis not present

## 2022-02-26 DIAGNOSIS — J432 Centrilobular emphysema: Secondary | ICD-10-CM | POA: Diagnosis not present

## 2022-02-26 LAB — TYPE AND SCREEN
ABO/RH(D): A POS
Antibody Screen: NEGATIVE

## 2022-02-26 LAB — COMPREHENSIVE METABOLIC PANEL
ALT: 26 U/L (ref 0–44)
AST: 45 U/L — ABNORMAL HIGH (ref 15–41)
Albumin: 3.9 g/dL (ref 3.5–5.0)
Alkaline Phosphatase: 53 U/L (ref 38–126)
Anion gap: 7 (ref 5–15)
BUN: 31 mg/dL — ABNORMAL HIGH (ref 8–23)
CO2: 24 mmol/L (ref 22–32)
Calcium: 9 mg/dL (ref 8.9–10.3)
Chloride: 105 mmol/L (ref 98–111)
Creatinine, Ser: 1.47 mg/dL — ABNORMAL HIGH (ref 0.61–1.24)
GFR, Estimated: 47 mL/min — ABNORMAL LOW (ref >60)
Glucose, Bld: 105 mg/dL — ABNORMAL HIGH (ref 70–99)
Potassium: 4.4 mmol/L (ref 3.5–5.1)
Sodium: 136 mmol/L (ref 135–145)
Total Bilirubin: 1.3 mg/dL — ABNORMAL HIGH (ref 0.3–1.2)
Total Protein: 6.3 g/dL — ABNORMAL LOW (ref 6.5–8.1)

## 2022-02-26 LAB — PROTIME-INR
INR: 2.4 — ABNORMAL HIGH (ref 0.8–1.2)
Prothrombin Time: 25.8 seconds — ABNORMAL HIGH (ref 11.4–15.2)

## 2022-02-26 LAB — CBC
HCT: 41.3 % (ref 39.0–52.0)
Hemoglobin: 13.1 g/dL (ref 13.0–17.0)
MCH: 31.6 pg (ref 26.0–34.0)
MCHC: 31.7 g/dL (ref 30.0–36.0)
MCV: 99.8 fL (ref 80.0–100.0)
Platelets: 156 10*3/uL (ref 150–400)
RBC: 4.14 MIL/uL — ABNORMAL LOW (ref 4.22–5.81)
RDW: 15.7 % — ABNORMAL HIGH (ref 11.5–15.5)
WBC: 7.7 10*3/uL (ref 4.0–10.5)
nRBC: 0 % (ref 0.0–0.2)

## 2022-02-26 LAB — POCT INR: INR: 3.3 — AB (ref 2.0–3.0)

## 2022-02-26 LAB — APTT: aPTT: 30 seconds (ref 24–36)

## 2022-02-26 MED ORDER — METOPROLOL TARTRATE 5 MG/5ML IV SOLN
5.0000 mg | Freq: Four times a day (QID) | INTRAVENOUS | Status: DC | PRN
  Administered 2022-02-28: 5 mg via INTRAVENOUS
  Filled 2022-02-26: qty 5

## 2022-02-26 MED ORDER — ONDANSETRON HCL 4 MG/2ML IJ SOLN
4.0000 mg | Freq: Once | INTRAMUSCULAR | Status: DC
  Filled 2022-02-26: qty 2

## 2022-02-26 MED ORDER — OXYCODONE HCL 5 MG PO TABS
5.0000 mg | ORAL_TABLET | ORAL | Status: DC | PRN
  Administered 2022-02-27: 5 mg via ORAL
  Filled 2022-02-26 (×2): qty 1

## 2022-02-26 MED ORDER — HYDRALAZINE HCL 20 MG/ML IJ SOLN: 10.0000 mg | INTRAMUSCULAR | Status: DC | PRN

## 2022-02-26 MED ORDER — IOHEXOL 300 MG/ML  SOLN
80.0000 mL | Freq: Once | INTRAMUSCULAR | Status: AC | PRN
  Administered 2022-02-26: 100 mL via INTRAVENOUS

## 2022-02-26 MED ORDER — MORPHINE SULFATE (PF) 2 MG/ML IV SOLN: 2.0000 mg | INTRAVENOUS | Status: DC | PRN

## 2022-02-26 MED ORDER — ONDANSETRON HCL 4 MG/2ML IJ SOLN: 4.0000 mg | Freq: Four times a day (QID) | INTRAMUSCULAR | Status: DC | PRN

## 2022-02-26 MED ORDER — DOCUSATE SODIUM 100 MG PO CAPS
100.0000 mg | ORAL_CAPSULE | Freq: Two times a day (BID) | ORAL | Status: DC
Start: 2022-02-26 — End: 2022-03-03
  Administered 2022-02-27 – 2022-03-03 (×9): 100 mg via ORAL
  Filled 2022-02-26 (×9): qty 1

## 2022-02-26 MED ORDER — ACETAMINOPHEN 325 MG PO TABS: 650.0000 mg | ORAL_TABLET | ORAL | Status: DC | PRN

## 2022-02-26 MED ORDER — CARVEDILOL 3.125 MG PO TABS
3.1250 mg | ORAL_TABLET | Freq: Two times a day (BID) | ORAL | Status: DC
  Administered 2022-02-27 – 2022-02-28 (×3): 3.125 mg via ORAL
  Filled 2022-02-26 (×4): qty 1

## 2022-02-26 MED ORDER — HYDROCODONE-ACETAMINOPHEN 5-325 MG PO TABS
1.0000 | ORAL_TABLET | Freq: Once | ORAL | Status: AC
  Administered 2022-02-26: 1 via ORAL
  Filled 2022-02-26: qty 1

## 2022-02-26 MED ORDER — MORPHINE SULFATE (PF) 4 MG/ML IV SOLN
4.0000 mg | Freq: Once | INTRAVENOUS | Status: AC
  Administered 2022-02-26: 4 mg via INTRAVENOUS
  Filled 2022-02-26: qty 1

## 2022-02-26 MED ORDER — ONDANSETRON 4 MG PO TBDP: 4.0000 mg | ORAL_TABLET | Freq: Four times a day (QID) | ORAL | Status: DC | PRN

## 2022-02-26 MED ORDER — VITAMIN K1 10 MG/ML IJ SOLN
10.0000 mg | INTRAVENOUS | Status: AC
  Administered 2022-02-26: 10 mg via INTRAVENOUS
  Filled 2022-02-26: qty 1

## 2022-02-26 MED ORDER — FUROSEMIDE 20 MG PO TABS
20.0000 mg | ORAL_TABLET | Freq: Every day | ORAL | Status: DC
Start: 2022-02-27 — End: 2022-03-01
  Administered 2022-02-27 – 2022-03-01 (×3): 20 mg via ORAL
  Filled 2022-02-26 (×3): qty 1

## 2022-02-26 MED ORDER — ONDANSETRON HCL 4 MG/2ML IJ SOLN
4.0000 mg | Freq: Once | INTRAMUSCULAR | Status: AC
  Administered 2022-02-26: 4 mg via INTRAVENOUS
  Filled 2022-02-26: qty 2

## 2022-02-26 MED ORDER — PROTHROMBIN COMPLEX CONC HUMAN 500 UNITS IV KIT
1509.0000 [IU] | PACK | Status: AC
  Administered 2022-02-26: 1509 [IU] via INTRAVENOUS
  Filled 2022-02-26: qty 1509

## 2022-02-26 MED ORDER — IPRATROPIUM BROMIDE 0.06 % NA SOLN
2.0000 | Freq: Two times a day (BID) | NASAL | Status: DC
  Administered 2022-02-26 – 2022-03-03 (×10): 2 via NASAL
  Filled 2022-02-26: qty 15

## 2022-02-26 NOTE — Progress Notes (Addendum)
Patient arrived to unit via [facility]. Patient AOX4 and vitals WNL. Focused assessment completed and patient oriented to unit.   Patient belongings: Clothes, shoes,watch and cane   RN called MD Kinsinger to get orders in and come see patient.   Bed in lowest position, call light in reach and bed alarm on.
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
## 2022-02-26 NOTE — ED Notes (Addendum)
Attempted to give report to [place] 3 RN at [facility] at this time. RN refusing to take report at this time. Floor aware that transport currently in route to pick up patient. This RN phone number left for call back.

## 2022-02-26 NOTE — Progress Notes (Signed)
Hold dose today and then continue 1 tablet daily except take 2 tablets on Mondays and Thursdays. Recheck INR in 2 week.

## 2022-02-26 NOTE — ED Notes (Signed)
Report given to [facility] at this time.   
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
## 2022-02-26 NOTE — Patient Instructions (Addendum)
Pre visit review using our clinic review tool, if applicable. No additional management support is needed unless otherwise documented below in the visit note.  Hold dose today and then continue 1 tablet daily except take 2 tablets on Mondays and Thursdays. Recheck INR in 2 week.
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
## 2022-02-26 NOTE — ED Notes (Signed)
Pt states he was at the clinic this am and had blood drawn there to check coumadin levels.

## 2022-02-27 ENCOUNTER — Inpatient Hospital Stay (HOSPITAL_COMMUNITY): Payer: Medicare Other

## 2022-02-27 DIAGNOSIS — R9431 Abnormal electrocardiogram [ECG] [EKG]: Secondary | ICD-10-CM

## 2022-02-27 LAB — ECHOCARDIOGRAM COMPLETE
AR max vel: 3.22 cm2
AV Area VTI: 2.88 cm2
AV Area mean vel: 3.06 cm2
AV Mean grad: 2 mmHg
AV Peak grad: 3.9 mmHg
Ao pk vel: 0.99 m/s
Height: 69 in
S' Lateral: 3.4 cm
Weight: 2640 oz

## 2022-02-27 LAB — CBC
HCT: 36.8 % — ABNORMAL LOW (ref 39.0–52.0)
Hemoglobin: 12.3 g/dL — ABNORMAL LOW (ref 13.0–17.0)
MCH: 32.1 pg (ref 26.0–34.0)
MCHC: 33.4 g/dL (ref 30.0–36.0)
MCV: 96.1 fL (ref 80.0–100.0)
Platelets: 142 10*3/uL — ABNORMAL LOW (ref 150–400)
RBC: 3.83 MIL/uL — ABNORMAL LOW (ref 4.22–5.81)
RDW: 15.6 % — ABNORMAL HIGH (ref 11.5–15.5)
WBC: 6.2 10*3/uL (ref 4.0–10.5)
nRBC: 0 % (ref 0.0–0.2)

## 2022-02-27 LAB — BASIC METABOLIC PANEL
Anion gap: 7 (ref 5–15)
BUN: 26 mg/dL — ABNORMAL HIGH (ref 8–23)
CO2: 26 mmol/L (ref 22–32)
Calcium: 8.5 mg/dL — ABNORMAL LOW (ref 8.9–10.3)
Chloride: 102 mmol/L (ref 98–111)
Creatinine, Ser: 1.38 mg/dL — ABNORMAL HIGH (ref 0.61–1.24)
GFR, Estimated: 51 mL/min — ABNORMAL LOW (ref >60)
Glucose, Bld: 80 mg/dL (ref 70–99)
Potassium: 4.2 mmol/L (ref 3.5–5.1)
Sodium: 135 mmol/L (ref 135–145)

## 2022-02-27 LAB — GLUCOSE, CAPILLARY: Glucose-Capillary: 131 mg/dL — ABNORMAL HIGH (ref 70–99)

## 2022-02-27 LAB — PROTIME-INR
INR: 1 (ref 0.8–1.2)
Prothrombin Time: 13.3 seconds (ref 11.4–15.2)

## 2022-02-27 MED ORDER — PERFLUTREN LIPID MICROSPHERE
1.0000 mL | INTRAVENOUS | Status: AC | PRN
  Administered 2022-02-27: 3 mL via INTRAVENOUS

## 2022-02-27 MED ORDER — OXYCODONE HCL 5 MG PO TABS
5.0000 mg | ORAL_TABLET | ORAL | Status: DC | PRN
  Administered 2022-02-27 – 2022-03-02 (×6): 5 mg via ORAL
  Filled 2022-02-27 (×5): qty 1

## 2022-02-27 MED ORDER — METHOCARBAMOL 500 MG PO TABS
500.0000 mg | ORAL_TABLET | Freq: Four times a day (QID) | ORAL | Status: DC
  Administered 2022-02-27 – 2022-03-03 (×16): 500 mg via ORAL
  Filled 2022-02-27 (×16): qty 1

## 2022-02-27 MED ORDER — ACETAMINOPHEN 500 MG PO TABS
1000.0000 mg | ORAL_TABLET | Freq: Four times a day (QID) | ORAL | Status: DC
  Administered 2022-02-27 – 2022-03-03 (×13): 1000 mg via ORAL
  Filled 2022-02-27 (×14): qty 2

## 2022-02-27 MED ORDER — LIDOCAINE 5 % EX PTCH
1.0000 | MEDICATED_PATCH | CUTANEOUS | Status: DC
Start: 2022-02-27 — End: 2022-03-03
  Administered 2022-02-27 – 2022-03-02 (×4): 1 via TRANSDERMAL
  Filled 2022-02-27 (×5): qty 1

## 2022-02-27 MED ORDER — POLYETHYLENE GLYCOL 3350 17 G PO PACK: 17.0000 g | PACK | Freq: Every day | ORAL | Status: DC | PRN

## 2022-02-27 MED ORDER — MORPHINE SULFATE (PF) 2 MG/ML IV SOLN
2.0000 mg | INTRAVENOUS | Status: DC | PRN
  Administered 2022-02-28 – 2022-03-02 (×2): 2 mg via INTRAVENOUS
  Filled 2022-02-27 (×2): qty 1
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
## 2022-02-27 NOTE — Significant Event (Addendum)
Rapid Response Event Note   Reason for Call :  Pt became briefly unresponsive following walking with physical therapy.   Upon returning to the room following ambulation, patient stated he needed to sit down and upon sitting down he briefly became unresponsive.  Initial VS: BP 99/67, HR 122, RR 26, SpO2 84% (poor waveform pleth)  Initial Focused Assessment:  Patient lying in bed, AO. Skin is warm, dry, pink. Congested, productive cough. Lung sounds are clear. He denies headache, lightheadedness, or dizziness. He endorses chest/rib pain related to his rib fractures. Pt does not recollect events as listed above.  VS: T 98.70F, BP 169/90, HR 121, RR 22, SpO2 96% on 3LNC CBG: 131  Interventions:  No intervention from RR RN  Plan of Care:  -Bedrest unless otherwise specified by primary MD -Stand-by assist when OOB -Fall precautions  Call rapid response for additional needs  Event Summary:  MD Notified: Dr. Bobbye Morton Call Time: [place] Time: 1632 End Time: [place], RN

## 2022-02-27 NOTE — Consult Note (Signed)
   [facility] Inpatient Consult   02/27/2022  Benjamin Soto 26-May-1936 [id number]  [place] Organization [ACO] Patient: [employer] Medicare  Primary Care Provider:  Tonia Ghent, MD, [place] [facility] is an embedded provider with a Chronic Care Management team and program, and is listed for the transition of care follow up and appointments.  Patient was showing as a readmission however looks as if he transferred to [facility] from [facility].  Encounters and Care Teams reveals he is active with the  Embedded practice service needs with the Embedded pharmacist.  Plan: Follow for progress and sent notification sent to the Embedded Care Management Pharmacist for additional  [facility] known for post hospital needs.  Please contact for further questions,  Natividad Brood, RN BSN [facility] Liaison  [phone] business mobile phone Toll free office [phone]  Fax number: [phone] [email or web address]

## 2022-02-27 NOTE — Progress Notes (Addendum)
Patient is alert but did have a fainting episode during physical therapy earlier. Rapid response and MD was notified.
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
## 2022-02-27 NOTE — Progress Notes (Signed)
  Echocardiogram 2D Echocardiogram has been performed.  Benjamin Soto 02/27/2022, 4:10 PM

## 2022-02-28 DIAGNOSIS — I4891 Unspecified atrial fibrillation: Secondary | ICD-10-CM

## 2022-02-28 LAB — CBC
HCT: 34.2 % — ABNORMAL LOW (ref 39.0–52.0)
Hemoglobin: 11.4 g/dL — ABNORMAL LOW (ref 13.0–17.0)
MCH: 32.9 pg (ref 26.0–34.0)
MCHC: 33.3 g/dL (ref 30.0–36.0)
MCV: 98.8 fL (ref 80.0–100.0)
Platelets: 127 10*3/uL — ABNORMAL LOW (ref 150–400)
RBC: 3.46 MIL/uL — ABNORMAL LOW (ref 4.22–5.81)
RDW: 15.7 % — ABNORMAL HIGH (ref 11.5–15.5)
WBC: 6.7 10*3/uL (ref 4.0–10.5)
nRBC: 0 % (ref 0.0–0.2)

## 2022-02-28 LAB — PROTIME-INR
INR: 1 (ref 0.8–1.2)
Prothrombin Time: 12.6 seconds (ref 11.4–15.2)

## 2022-02-28 MED ORDER — METOPROLOL TARTRATE 25 MG PO TABS
25.0000 mg | ORAL_TABLET | Freq: Four times a day (QID) | ORAL | Status: AC
  Administered 2022-02-28 – 2022-03-03 (×11): 25 mg via ORAL
  Filled 2022-02-28 (×11): qty 1

## 2022-02-28 MED ORDER — BOOST / RESOURCE BREEZE PO LIQD CUSTOM
1.0000 | Freq: Two times a day (BID) | ORAL | Status: DC
  Administered 2022-02-28 – 2022-03-02 (×4): 1 via ORAL

## 2022-02-28 NOTE — Progress Notes (Signed)
Pt was found slumped at the foot of the bed with his friend in room at his side trying to assist pt up the bed. RN assisted pt and he had an incontinence of urine episode x1. BP checked at that time was 99/82 with MAP of 69, HR in the 130's and afib on the monitor. Telemetry also called RN to report Afib RVR. EKG was obtained; Dr. Redmond Pulling notified and see below. Francis Gaines Edilberto Roosevelt RN   02/28/22 1721  Vitals  Temp 98 F (36.7 C)  Temp Source Oral  BP 125/74  MAP (mmHg) 87  BP Location Right Arm  BP Method Automatic  Patient Position (if appropriate) Lying  Pulse Rate 90  Pulse Rate Source Monitor  ECG Heart Rate (!) 122  Resp 20  MEWS COLOR  MEWS Score Color Yellow  Oxygen Therapy  SpO2 100 %  O2 Device Nasal Cannula  O2 Flow Rate (L/min) 2 L/min  MEWS Score  MEWS Temp 0  MEWS Systolic 0  MEWS Pulse 2  MEWS RR 0  MEWS LOC 0  MEWS Score 2  Provider Notification  Provider Name/Title Dr. Greer Pickerel  Date Provider Notified 02/28/22  Time Provider Notified 1715  Method of Notification Page  Notification Reason Change in status (Afib RVR on EKG)  Provider response Evaluate remotely (ordered to give PRN metoprolol)  Date of Provider Response 02/28/22  Time of Provider Response 1720

## 2022-02-28 NOTE — Progress Notes (Signed)
Pt continues to have the rattle cough which is non productive during shift; RT was notified to come assess and MD was notified. New orders received. Will continue to monitor closely and report off to oncoming RN. Delia Heady RN
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
## 2022-03-01 LAB — CBC
HCT: 33.7 % — ABNORMAL LOW (ref 39.0–52.0)
Hemoglobin: 10.8 g/dL — ABNORMAL LOW (ref 13.0–17.0)
MCH: 32.2 pg (ref 26.0–34.0)
MCHC: 32 g/dL (ref 30.0–36.0)
MCV: 100.6 fL — ABNORMAL HIGH (ref 80.0–100.0)
Platelets: 130 10*3/uL — ABNORMAL LOW (ref 150–400)
RBC: 3.35 MIL/uL — ABNORMAL LOW (ref 4.22–5.81)
RDW: 15.4 % (ref 11.5–15.5)
WBC: 6.3 10*3/uL (ref 4.0–10.5)
nRBC: 0 % (ref 0.0–0.2)

## 2022-03-01 MED ORDER — FUROSEMIDE 20 MG PO TABS
20.0000 mg | ORAL_TABLET | ORAL | Status: DC
  Administered 2022-03-03: 20 mg via ORAL
  Filled 2022-03-01: qty 1

## 2022-03-01 MED ORDER — HEPARIN SODIUM (PORCINE) 5000 UNIT/ML IJ SOLN
5000.0000 [IU] | Freq: Three times a day (TID) | INTRAMUSCULAR | Status: DC
  Administered 2022-03-01 – 2022-03-03 (×6): 5000 [IU] via SUBCUTANEOUS
  Filled 2022-03-01 (×6): qty 1
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
## 2022-03-02 DIAGNOSIS — I4821 Permanent atrial fibrillation: Secondary | ICD-10-CM

## 2022-03-02 LAB — CBC
HCT: 31.8 % — ABNORMAL LOW (ref 39.0–52.0)
Hemoglobin: 10.6 g/dL — ABNORMAL LOW (ref 13.0–17.0)
MCH: 32.9 pg (ref 26.0–34.0)
MCHC: 33.3 g/dL (ref 30.0–36.0)
MCV: 98.8 fL (ref 80.0–100.0)
Platelets: 143 10*3/uL — ABNORMAL LOW (ref 150–400)
RBC: 3.22 MIL/uL — ABNORMAL LOW (ref 4.22–5.81)
RDW: 15.2 % (ref 11.5–15.5)
WBC: 5.1 10*3/uL (ref 4.0–10.5)
nRBC: 0 % (ref 0.0–0.2)

## 2022-03-02 LAB — BASIC METABOLIC PANEL
Anion gap: 10 (ref 5–15)
BUN: 46 mg/dL — ABNORMAL HIGH (ref 8–23)
CO2: 24 mmol/L (ref 22–32)
Calcium: 8.8 mg/dL — ABNORMAL LOW (ref 8.9–10.3)
Chloride: 100 mmol/L (ref 98–111)
Creatinine, Ser: 1.6 mg/dL — ABNORMAL HIGH (ref 0.61–1.24)
GFR, Estimated: 42 mL/min — ABNORMAL LOW (ref >60)
Glucose, Bld: 103 mg/dL — ABNORMAL HIGH (ref 70–99)
Potassium: 4.4 mmol/L (ref 3.5–5.1)
Sodium: 134 mmol/L — ABNORMAL LOW (ref 135–145)

## 2022-03-02 LAB — MAGNESIUM: Magnesium: 1.6 mg/dL — ABNORMAL LOW (ref 1.7–2.4)

## 2022-03-02 MED ORDER — ENSURE ENLIVE PO LIQD
237.0000 mL | Freq: Three times a day (TID) | ORAL | Status: DC
  Administered 2022-03-02: 237 mL via ORAL

## 2022-03-02 MED ORDER — GUAIFENESIN ER 600 MG PO TB12
600.0000 mg | ORAL_TABLET | Freq: Two times a day (BID) | ORAL | Status: DC
  Administered 2022-03-02 – 2022-03-03 (×3): 600 mg via ORAL
  Filled 2022-03-02 (×3): qty 1

## 2022-03-02 MED ORDER — BISACODYL 10 MG RE SUPP: 10.0000 mg | Freq: Every day | RECTAL | Status: DC | PRN

## 2022-03-02 MED ORDER — MAGNESIUM SULFATE 2 GM/50ML IV SOLN
2.0000 g | Freq: Once | INTRAVENOUS | Status: AC
  Administered 2022-03-02: 2 g via INTRAVENOUS
  Filled 2022-03-02: qty 50

## 2022-03-02 MED ORDER — ADULT MULTIVITAMIN W/MINERALS CH
1.0000 | ORAL_TABLET | Freq: Every day | ORAL | Status: DC
  Administered 2022-03-02 – 2022-03-03 (×2): 1 via ORAL
  Filled 2022-03-02 (×2): qty 1

## 2022-03-02 MED ORDER — POLYETHYLENE GLYCOL 3350 17 G PO PACK
17.0000 g | PACK | Freq: Two times a day (BID) | ORAL | Status: DC
  Administered 2022-03-02 – 2022-03-03 (×3): 17 g via ORAL
  Filled 2022-03-02 (×3): qty 1

## 2022-03-02 MED ORDER — METOPROLOL SUCCINATE ER 100 MG PO TB24
100.0000 mg | ORAL_TABLET | Freq: Every day | ORAL | Status: DC
  Administered 2022-03-03: 100 mg via ORAL
  Filled 2022-03-02: qty 1
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
## 2022-03-02 NOTE — Progress Notes (Signed)
Mobility Specialist Criteria Algorithm Info.   03/02/22 1500  Mobility  Activity Ambulated with assistance in hallway;Dangled on edge of bed;Transferred from bed to chair (to chair after ambulation)  Range of Motion/Exercises Active;All extremities  Level of Assistance Minimal assist, patient does 75% or more  Assistive Device Front wheel walker  Distance Ambulated (ft) 280 ft  Activity Response Tolerated well   Patient received dangling EOB with bed alarm active, attempting to get OOB without assistance. Presented with confusion or delirium and is A&O to self, time and place but not situation. Pt was reoriented and agreed to participate in mobility. Ambulated in hallway min A with slightly unsteady gait. Requires mod-max cues for hand placement, sequencing and environmental navigation. Also needed standing rest break x1 second to lower back "discomfort", denies pain. Returned to room without complaint or incident. Was left in recliner chair with all needs met, call bell in reach, and chair alarm set.   03/02/2022 3:54 PM  Jordan Tripp, CMS, BS EXP Acute Rehabilitation Services  Phone:[phone] Office: [phone]  

## 2022-03-02 NOTE — Progress Notes (Signed)
2323-Patient at this time continues to get out of bed. This RN offered to help patient into chair but patient refused. Pt stated that "he is independent and can get up and move around if he wants too'. This RN educated patient on the importance of calling out before getting up.   Patient is also pulling at tele monitor. RN has reapplied monitoring and again educated patient on the importance of keeping monitoring on.     Floor matts applied, socks on, bed in lowest position, call light in reach and bed alarm on
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
## 2022-03-02 NOTE — Progress Notes (Signed)
Calorie Count Note: Day 1 Results  48-hour calorie count ordered. Calorie count was started at lunch meal on 03/01/22. Please see day 1 results below. RD will follow up tomorrow to provide day 2 results.  Please see RD Initial Nutrition Assessment from today for additional details.  Diet: Heart Healthy with thin liquids Supplements:  - Boost Breeze po BID, each supplement provides 250 kcal and 9 grams of protein  Day 1: Lunch: 0 kcal, 0 grams of protein Dinner: no documentation available Breakfast: 338 kcal, 14 grams of protein Supplements: 500 kcal, 18 grams of protein  Day 1 total 24-hour intake: 838 kcal (47% of minimum estimated needs)  32 grams of protein (25% of minimum estimated needs)  Nutrition Diagnosis: Severe Malnutrition related to chronic illness (CHF) as evidenced by severe fat depletion, severe muscle depletion.  Goal: Patient will meet greater than or equal to 90% of their needs.  Intervention:  - Continue calorie count for 1 more day - Ensure Enlive TID - d/c Boost Breeze - Liberalize diet to Regular - MVI with minerals daily   Gustavus Bryant, MS, RD, LDN Inpatient Clinical Dietitian Please see AMiON for contact information.

## 2022-03-02 NOTE — Progress Notes (Signed)
Calorie Count Note: Day 2 Results  48-hour calorie count ordered. Calorie count was started at lunch meal on 03/01/22. Please see day 2 results below.  Noted plan for pt to discharge today.  Diet: Regular with thin liquids Supplements:  - Ensure Enlive po TID, each supplement provides 350 kcal and 20 grams of protein  Day 2: Lunch: 313 kcal, 19 grams of protein Dinner: no documentation available Breakfast: no documentation available Supplements:  350 kcal, 20 grams of protein  Day 2 total 24-hour intake: 663 kcal (37% of minimum estimated needs)  39 grams of protein (46% of minimum estimated needs)  Nutrition Diagnosis: Severe Malnutrition related to chronic illness (CHF) as evidenced by severe fat depletion, severe muscle depletion.  Goal: Patient will meet greater than or equal to 90% of their needs.  Intervention: - d/c calorie count - Continue Ensure Enlive TID - Continue MVI with minerals daily   Gustavus Bryant, MS, RD, LDN Inpatient Clinical Dietitian Please see AMiON for contact information.

## 2022-03-02 NOTE — Care Management Important Message (Signed)
Important Message  Patient Details  Name: Benjamin Soto MRN: [id number] Date of Birth: 13-Dec-1935   Medicare Important Message Given:  Yes     Orbie Pyo 03/02/2022, 4:02 PM

## 2022-03-03 DIAGNOSIS — E43 Unspecified severe protein-calorie malnutrition: Secondary | ICD-10-CM | POA: Insufficient documentation

## 2022-03-03 MED ORDER — METHOCARBAMOL 500 MG PO TABS: 500.0000 mg | ORAL_TABLET | Freq: Four times a day (QID) | ORAL | 0 refills | Status: DC | PRN

## 2022-03-03 MED ORDER — DOCUSATE SODIUM 100 MG PO CAPS: 100.0000 mg | ORAL_CAPSULE | Freq: Two times a day (BID) | ORAL | 0 refills | Status: AC

## 2022-03-03 MED ORDER — GUAIFENESIN ER 600 MG PO TB12: 600.0000 mg | ORAL_TABLET | Freq: Two times a day (BID) | ORAL | Status: DC

## 2022-03-03 MED ORDER — OXYCODONE HCL 5 MG PO TABS: 5.0000 mg | ORAL_TABLET | Freq: Four times a day (QID) | ORAL | 0 refills | Status: DC | PRN

## 2022-03-03 MED ORDER — METOPROLOL SUCCINATE ER 100 MG PO TB24: 100.0000 mg | ORAL_TABLET | Freq: Every day | ORAL | 0 refills | Status: DC

## 2022-03-03 MED ORDER — POLYETHYLENE GLYCOL 3350 17 G PO PACK: 17.0000 g | PACK | Freq: Every day | ORAL | 0 refills | Status: AC | PRN

## 2022-03-03 MED ORDER — LIDOCAINE 5 % EX PTCH: 1.0000 | MEDICATED_PATCH | CUTANEOUS | 0 refills | Status: DC

## 2022-03-03 NOTE — Progress Notes (Signed)
0415-Pt is non compliant with tele monitoring at this time.

## 2022-03-03 NOTE — TOC Transition Note (Signed)
Transition of Care [facility]) - CM/SW Discharge Note   Patient Details  Name: Benjamin Soto MRN: [id number] Date of Birth: 09-16-1936  Transition of Care [facility]) CM/SW Contact:  Ella Bodo, RN Phone Number: 03/03/2022, 2:52 PM   Clinical Narrative:    Pt medically stable discharge home today with son and friends/neighbors to assist with care.  Home health PT/OT to follow at home, as previously arranged.  Notified [facility] of discharge home today.  No other dc needs identified.    Final next level of care: [place] Barriers to Discharge: Barriers Resolved   Patient Goals and CMS Choice Patient states their goals for this hospitalization and ongoing recovery are:: to feel better CMS Medicare.gov Compare Post Acute Care list provided to:: Patient Choice offered to / list presented to : Patient                      Discharge Plan and Services   Discharge Planning Services: CM Consult Post Acute Care Choice: Home Health                    HH Arranged: PT, OT [facility] Agency: [place] Date [place]: 02/27/22 Time [place]: [id number] Representative spoke with at [place]: Adela Lank  Social Determinants of Health (SDOH) Interventions     Readmission Risk Interventions    05/08/2020   12:10 PM  Readmission Risk Prevention Plan  Post Dischage Appt Complete  Medication Screening Complete  Transportation Screening Complete   Reinaldo Raddle, RN, BSN  Trauma/Neuro ICU Case Manager [phone]

## 2022-03-03 NOTE — Progress Notes (Signed)
Patients son is currently at bedside and told this RN that he believes its best if his dad was at home and was wanting to know what other plans the doctors had for his father to be able to go home. Son has expressed that he would like to take his father home now. Patient has been trying to get out of bed all night and has not been compliant with tele monitoring. Stating he is independent and is ready to leave.   RN will page MD and see what can be done about this.
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
## 2022-03-05 ENCOUNTER — Telehealth: Payer: Self-pay

## 2022-03-05 NOTE — Telephone Encounter (Signed)
Pt LVM that he was d/c from hospital and wanted to RS his coumadin clinic apt for next week.   Pt reports he does not think he will be able to come to coumadin clinic apt next week. He said he will call back to RS. Advised pt this nurse will contact him next week to see how he is doing. Advised if anything is needed to contact coumadin clinic or office. Pt appreciative and verbalized understanding.

## 2022-03-05 NOTE — Telephone Encounter (Signed)
Transition Care Management Unsuccessful Follow-up Telephone Call  Date of discharge and from where:  03/03/2022  [facility]   Attempts:  1st Attempt  Reason for unsuccessful TCM follow-up call:  No answer/busy

## 2022-03-06 NOTE — Telephone Encounter (Signed)
Please see about seeing inpatient f/u OV with me when possible.  If needed, could be at the time of the coumadin visit.  Thanks.

## 2022-03-06 NOTE — Telephone Encounter (Signed)
Transition Care Management Unsuccessful Follow-up Telephone Call  Date of discharge and from where:  Benjamin Soto 03/03/2022  Attempts:  2nd Attempt  Reason for unsuccessful TCM follow-up call:  Unable to leave message

## 2022-03-09 ENCOUNTER — Telehealth: Payer: Self-pay | Admitting: Family Medicine

## 2022-03-09 DIAGNOSIS — S2249XS Multiple fractures of ribs, unspecified side, sequela: Secondary | ICD-10-CM
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
## 2022-03-10 NOTE — Telephone Encounter (Signed)
Spoke with pt asking for update.  States he's doing ok, just sore all over.  Says he has not heard anything from [facility] yet.

## 2022-03-11 NOTE — Telephone Encounter (Signed)
Please help this man get scheduled in some way so that he will be able to get home health care.  Thanks.

## 2022-03-11 NOTE — Telephone Encounter (Signed)
Ashtyn Can we reach  out to home health per Dr. Josefine Class message.

## 2022-03-11 NOTE — Telephone Encounter (Signed)
Unfortunately, It would have to be within 30 days prior to being accepted.  Per Medicare the F2F must take place before the patient can be accepted by [facility]

## 2022-03-11 NOTE — Telephone Encounter (Signed)
Please call home health and see when they will be able to see patient.  Thanks.
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
## 2022-03-11 NOTE — Telephone Encounter (Signed)
Spoke with Benjamin Soto - pt last established with them in 2021.  Request the [place] Order be faxed ASAP Fax to [phone] ATTN: Urgent Appt Needed.      **Fax the order/referral, last OV (face to face) and insurance card**  [facility] Medicare is an accepted insurance.  Once they receive the referral the patient will be scheduled within 24-48hrs.   Number to call back to follow up  Phone: [phone]  Asking CMA/RN in office to print and fax this referral/order to ensure it is received.

## 2022-03-11 NOTE — Telephone Encounter (Signed)
See below.  Please send.  Thanks.

## 2022-03-11 NOTE — Telephone Encounter (Signed)
Is there no exception for a visit to be scheduled in the next 30 days?  That was prev the case.    If not, then he'll need face to face visit scheduled when possible, ie asap.  Thanks.
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
## 2022-03-12 ENCOUNTER — Ambulatory Visit: Payer: Medicare Other

## 2022-03-12 NOTE — Telephone Encounter (Signed)
Please schedule appointment with Dr. Damita Dunnings.  He needs a face to face appointment before he can get home health.

## 2022-03-12 NOTE — Telephone Encounter (Signed)
Thanks.  Will see at [place].

## 2022-03-12 NOTE — Telephone Encounter (Signed)
Called patient and scheduled him for 6.27.2023 for a follow up. Please advise, thanks.  Callback Number: [phone]

## 2022-03-14 DIAGNOSIS — I5022 Chronic systolic (congestive) heart failure: Secondary | ICD-10-CM | POA: Diagnosis not present

## 2022-03-14 DIAGNOSIS — S2241XD Multiple fractures of ribs, right side, subsequent encounter for fracture with routine healing: Secondary | ICD-10-CM | POA: Diagnosis not present

## 2022-03-14 DIAGNOSIS — I2510 Atherosclerotic heart disease of native coronary artery without angina pectoris: Secondary | ICD-10-CM | POA: Diagnosis not present

## 2022-03-14 DIAGNOSIS — E119 Type 2 diabetes mellitus without complications: Secondary | ICD-10-CM | POA: Diagnosis not present

## 2022-03-14 DIAGNOSIS — I110 Hypertensive heart disease with heart failure: Secondary | ICD-10-CM | POA: Diagnosis not present

## 2022-03-17 ENCOUNTER — Ambulatory Visit (INDEPENDENT_AMBULATORY_CARE_PROVIDER_SITE_OTHER): Payer: Medicare Other | Admitting: Family Medicine

## 2022-03-17 ENCOUNTER — Encounter: Payer: Self-pay | Admitting: Family Medicine

## 2022-03-17 VITALS — BP 108/70 | HR 64 | Temp 97.2°F | Ht 69.0 in | Wt 144.0 lb

## 2022-03-17 DIAGNOSIS — R0789 Other chest pain: Secondary | ICD-10-CM | POA: Diagnosis not present

## 2022-03-17 DIAGNOSIS — I482 Chronic atrial fibrillation, unspecified: Secondary | ICD-10-CM | POA: Diagnosis not present
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
## 2022-03-18 ENCOUNTER — Other Ambulatory Visit: Payer: Self-pay | Admitting: Family Medicine

## 2022-03-18 ENCOUNTER — Ambulatory Visit: Payer: Medicare Other | Admitting: Nurse Practitioner

## 2022-03-18 DIAGNOSIS — R0789 Other chest pain: Secondary | ICD-10-CM | POA: Insufficient documentation

## 2022-03-18 LAB — BASIC METABOLIC PANEL
BUN: 31 mg/dL — ABNORMAL HIGH (ref 6–23)
CO2: 29 mEq/L (ref 19–32)
Calcium: 8.9 mg/dL (ref 8.4–10.5)
Chloride: 99 mEq/L (ref 96–112)
Creatinine, Ser: 1.56 mg/dL — ABNORMAL HIGH (ref 0.40–1.50)
GFR: 40.7 mL/min — ABNORMAL LOW (ref >60.00)
Glucose, Bld: 91 mg/dL (ref 70–99)
Potassium: 5.3 mEq/L — ABNORMAL HIGH (ref 3.5–5.1)
Sodium: 135 mEq/L (ref 135–145)

## 2022-03-18 LAB — CBC WITH DIFFERENTIAL/PLATELET
Basophils Absolute: 0 10*3/uL (ref 0.0–0.1)
Basophils Relative: 0.6 % (ref 0.0–3.0)
Eosinophils Absolute: 0.1 10*3/uL (ref 0.0–0.7)
Eosinophils Relative: 2.1 % (ref 0.0–5.0)
HCT: 34.9 % — ABNORMAL LOW (ref 39.0–52.0)
Hemoglobin: 11.6 g/dL — ABNORMAL LOW (ref 13.0–17.0)
Lymphocytes Relative: 15.2 % (ref 12.0–46.0)
Lymphs Abs: 1 10*3/uL (ref 0.7–4.0)
MCHC: 33.2 g/dL (ref 30.0–36.0)
MCV: 97.9 fl (ref 78.0–100.0)
Monocytes Absolute: 0.6 10*3/uL (ref 0.1–1.0)
Monocytes Relative: 9.6 % (ref 3.0–12.0)
Neutro Abs: 4.6 10*3/uL (ref 1.4–7.7)
Neutrophils Relative %: 72.5 % (ref 43.0–77.0)
Platelets: 298 10*3/uL (ref 150.0–400.0)
RBC: 3.57 Mil/uL — ABNORMAL LOW (ref 4.22–5.81)
RDW: 15.1 % (ref 11.5–15.5)
WBC: 6.4 10*3/uL (ref 4.0–10.5)

## 2022-03-18 NOTE — Assessment & Plan Note (Signed)
Improving in the meantime.  Routine cautions given to patient.  Discussed about pain medication and bowel regimen.  34 minutes were devoted to patient care in this encounter (this includes time spent reviewing the patient's file/history, interviewing and examining the patient, counseling/reviewing plan with patient).

## 2022-03-18 NOTE — Assessment & Plan Note (Addendum)
Staff coming in for now.  I will update the anticoagulation clinic after we check his labs.  At this point still okay for outpatient follow-up.  He agrees with plan.  He has not been recently taking Lasix and it makes sense to restart that every other day with routine cautions given to patient.

## 2022-03-19 ENCOUNTER — Telehealth: Payer: Self-pay

## 2022-03-19 NOTE — Telephone Encounter (Signed)
Please give the order.  Thanks.   

## 2022-03-19 NOTE — Telephone Encounter (Signed)
Home Health verbal orders  Agency Name: [facility]   Requesting PT   Frequency:1 W 1 / 2 W2 4  Please forward to [facility] pool or providers CMA

## 2022-03-19 NOTE — Telephone Encounter (Signed)
Verbal orders given to [facility]
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
## 2022-03-25 DIAGNOSIS — Z8673 Personal history of transient ischemic attack (TIA), and cerebral infarction without residual deficits: Secondary | ICD-10-CM | POA: Diagnosis not present

## 2022-03-25 DIAGNOSIS — M109 Gout, unspecified: Secondary | ICD-10-CM | POA: Diagnosis not present

## 2022-03-25 DIAGNOSIS — I110 Hypertensive heart disease with heart failure: Secondary | ICD-10-CM | POA: Diagnosis not present

## 2022-03-25 DIAGNOSIS — E119 Type 2 diabetes mellitus without complications: Secondary | ICD-10-CM | POA: Diagnosis not present

## 2022-03-25 DIAGNOSIS — I4821 Permanent atrial fibrillation: Secondary | ICD-10-CM | POA: Diagnosis not present

## 2022-03-25 DIAGNOSIS — I2510 Atherosclerotic heart disease of native coronary artery without angina pectoris: Secondary | ICD-10-CM | POA: Diagnosis not present

## 2022-03-25 DIAGNOSIS — E785 Hyperlipidemia, unspecified: Secondary | ICD-10-CM | POA: Diagnosis not present

## 2022-03-25 DIAGNOSIS — Z9181 History of falling: Secondary | ICD-10-CM | POA: Diagnosis not present

## 2022-03-25 DIAGNOSIS — F17210 Nicotine dependence, cigarettes, uncomplicated: Secondary | ICD-10-CM | POA: Diagnosis not present

## 2022-03-25 DIAGNOSIS — Z86010 Personal history of colonic polyps: Secondary | ICD-10-CM | POA: Diagnosis not present

## 2022-03-25 DIAGNOSIS — I5022 Chronic systolic (congestive) heart failure: Secondary | ICD-10-CM | POA: Diagnosis not present

## 2022-03-26 ENCOUNTER — Telehealth: Payer: Self-pay
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
## 2022-03-30 ENCOUNTER — Encounter: Payer: Self-pay | Admitting: Family Medicine

## 2022-03-30 ENCOUNTER — Ambulatory Visit (INDEPENDENT_AMBULATORY_CARE_PROVIDER_SITE_OTHER)
Admission: RE | Admit: 2022-03-30 | Discharge: 2022-03-30 | Disposition: A | Discharge status: Home / Self Care | Payer: Medicare Other | Source: Ambulatory Visit | Attending: Family Medicine | Admitting: Family Medicine

## 2022-03-30 ENCOUNTER — Ambulatory Visit (INDEPENDENT_AMBULATORY_CARE_PROVIDER_SITE_OTHER): Payer: Medicare Other | Admitting: Family Medicine

## 2022-03-30 ENCOUNTER — Telehealth: Payer: Self-pay | Admitting: Family Medicine

## 2022-03-30 VITALS — BP 130/80 | HR 81 | Temp 97.3°F | Ht 69.0 in | Wt 137.0 lb

## 2022-03-30 DIAGNOSIS — M25511 Pain in right shoulder: Secondary | ICD-10-CM | POA: Diagnosis not present

## 2022-03-30 MED ORDER — ACETAMINOPHEN 500 MG PO TABS: 500.0000 mg | ORAL_TABLET | Freq: Three times a day (TID) | ORAL | Status: AC | PRN

## 2022-03-30 NOTE — Telephone Encounter (Signed)
Called and spoke with Benjamin Soto. Right now there is nothing they can do as they have closed their case with the patient. They stated they have to have weekly contact with the patient by telephone and patient did not answer for 2 weeks. They did not go over meds at visits as that was just for PT and he would need nursing to do this. A new referral will need to be done for nursing in order for them to go out and that also depends on if he will answer the phone.

## 2022-03-30 NOTE — Patient Instructions (Addendum)
Go to the lab on the way out.   If you have mychart we'll likely use that to update you.     Take 1-2 tylenol up to 3 times a day as needed for pain.    We'll call about getting you some extra help with your medicines at home.   Take care.  Glad to see you.

## 2022-03-30 NOTE — Progress Notes (Unsigned)
He needs HH eval re: meds in the home.  He was confused about his meds.   R>L shoulder pain and pain with ROM.  He has to use his L arm to help lift his R arm.  Rib pain is some better but not resolved.    He knows the year, month, and day.    IRR, not tachy.    He has R arm partial abduction but then needs L arm assist.

## 2022-03-30 NOTE — Telephone Encounter (Signed)
Please see about getting home health to help patient verify and organize his meds.  Benjamin Soto.  Thanks.

## 2022-03-31 NOTE — Telephone Encounter (Signed)
I put in the home health referral.  Thanks.

## 2022-03-31 NOTE — Addendum Note (Signed)
Addended by: Tonia Ghent on: 03/31/2022 08:53 AM   Modules accepted: Orders

## 2022-03-31 NOTE — Telephone Encounter (Signed)
Msg sent to [facility] Rep to review new F2F visits to approve [facility].  See referral notes.   Nothing further needed.

## 2022-04-01 DIAGNOSIS — M25511 Pain in right shoulder: Secondary | ICD-10-CM | POA: Insufficient documentation

## 2022-04-01 NOTE — Assessment & Plan Note (Signed)
The concern is for rotator cuff symptoms.  Discussed with patient.  Reasonable to check plain films today.  Reasonable to take 1-2 tylenol up to 3 times a day as needed for pain.  Order separately placed for home health to help with med reconciliation at home/etc.

## 2022-04-07 DIAGNOSIS — Z961 Presence of intraocular lens: Secondary | ICD-10-CM | POA: Diagnosis not present

## 2022-04-07 DIAGNOSIS — H57813 Brow ptosis, bilateral: Secondary | ICD-10-CM | POA: Diagnosis not present

## 2022-04-07 DIAGNOSIS — H401131 Primary open-angle glaucoma, bilateral, mild stage: Secondary | ICD-10-CM | POA: Diagnosis not present

## 2022-04-07 DIAGNOSIS — H04123 Dry eye syndrome of bilateral lacrimal glands: Secondary | ICD-10-CM | POA: Diagnosis not present

## 2022-04-15 ENCOUNTER — Encounter: Payer: Self-pay | Admitting: Family Medicine

## 2022-04-15 ENCOUNTER — Ambulatory Visit (INDEPENDENT_AMBULATORY_CARE_PROVIDER_SITE_OTHER): Payer: Medicare Other | Admitting: Family Medicine

## 2022-04-15 VITALS — BP 112/64 | HR 59 | Temp 97.3°F | Ht 69.0 in | Wt 131.4 lb

## 2022-04-15 DIAGNOSIS — I482 Chronic atrial fibrillation, unspecified: Secondary | ICD-10-CM | POA: Diagnosis not present

## 2022-04-15 DIAGNOSIS — W19XXXA Unspecified fall, initial encounter: Secondary | ICD-10-CM

## 2022-04-15 DIAGNOSIS — S51811A Laceration without foreign body of right forearm, initial encounter: Secondary | ICD-10-CM | POA: Diagnosis not present
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
## 2022-04-15 NOTE — Patient Instructions (Signed)
Continue daily dressing change as today.  Use antibiotic ointment over the counter as dressing.  Schedule follow up with Dr Damita Dunnings in 1 week.

## 2022-04-15 NOTE — Assessment & Plan Note (Addendum)
He has been using betadine and silver sulfaldiazine which could be impeding wound healing.  No signs of infection.  Wound care performed, wounds dressed, home care instructions provided, recommend daily to QOD dressing change.  Recommend RTC 1 wk f/u with PCP.

## 2022-04-17 ENCOUNTER — Ambulatory Visit: Payer: Self-pay

## 2022-04-17 NOTE — Progress Notes (Signed)
Pt has been taken off of warfarin. Resolving anticoagulation episodes.

## 2022-04-21 ENCOUNTER — Encounter: Payer: Self-pay | Admitting: Family Medicine

## 2022-04-21 ENCOUNTER — Ambulatory Visit (INDEPENDENT_AMBULATORY_CARE_PROVIDER_SITE_OTHER): Payer: Medicare Other | Admitting: Family Medicine

## 2022-04-21 VITALS — BP 102/70 | HR 72 | Temp 97.7°F | Ht 69.0 in | Wt 136.0 lb

## 2022-04-21 DIAGNOSIS — I482 Chronic atrial fibrillation, unspecified: Secondary | ICD-10-CM

## 2022-04-21 DIAGNOSIS — S51819D Laceration without foreign body of unspecified forearm, subsequent encounter: Secondary | ICD-10-CM | POA: Diagnosis not present

## 2022-04-21 DIAGNOSIS — S51811A Laceration without foreign body of right forearm, initial encounter: Secondary | ICD-10-CM | POA: Diagnosis not present

## 2022-04-21 NOTE — Patient Instructions (Addendum)
Let me see about options for monthly blister packs for your medicine. I'll check with social work about [employer], Social research officer, government.   Take care.  Glad to see you. See about getting a new cane tip at the pharmacy.    I think it is a good idea to have a call button.  You can call [phone] to see about options.    Use nonstick pads on the skin tears and update me as needed.

## 2022-04-21 NOTE — Progress Notes (Unsigned)
We talked about [facility]  He is considering options.  Needs to check with SW.    We talked about getting automatic rx delivery.  The main issue is getting monthly blister packs.  Check with pharmacy.    We talked about PT at home.  He needs that set up at home.   Fall alert and driving d/w pt.     Pharmacy pill pack  Social work PT at home.

## 2022-04-22 ENCOUNTER — Telehealth: Payer: Self-pay | Admitting: Family Medicine

## 2022-04-22 NOTE — Telephone Encounter (Signed)
Patient consented for home health PT.  Can you use the pre-existing order to reset that?

## 2022-04-22 NOTE — Assessment & Plan Note (Signed)
Healing, routine cautions given to patient.  Redressed.  Fall cautions discussed.

## 2022-04-22 NOTE — Assessment & Plan Note (Signed)
Discussed staying off anticoagulation at this point given his fall risk and the potential for bleeding.  He agreed.

## 2022-04-27 ENCOUNTER — Telehealth: Payer: Self-pay

## 2022-04-27 ENCOUNTER — Ambulatory Visit (INDEPENDENT_AMBULATORY_CARE_PROVIDER_SITE_OTHER): Payer: Medicare Other | Admitting: Pharmacist

## 2022-04-27 ENCOUNTER — Telehealth: Payer: Self-pay | Admitting: Family Medicine

## 2022-04-27 DIAGNOSIS — I2510 Atherosclerotic heart disease of native coronary artery without angina pectoris: Secondary | ICD-10-CM

## 2022-04-27 DIAGNOSIS — I5022 Chronic systolic (congestive) heart failure: Secondary | ICD-10-CM

## 2022-04-27 DIAGNOSIS — I10 Essential (primary) hypertension: Secondary | ICD-10-CM

## 2022-04-27 DIAGNOSIS — I482 Chronic atrial fibrillation, unspecified: Secondary | ICD-10-CM

## 2022-04-27 DIAGNOSIS — E782 Mixed hyperlipidemia: Secondary | ICD-10-CM

## 2022-04-27 NOTE — Progress Notes (Signed)
    Chronic Care Management Pharmacy Assistant   Name: Benjamin Soto  MRN: [id number] DOB: [birth year]  Reason for Encounter:CCM (Appointment Reminder)  Benjamin Soto was contacted to remind of upcoming telephone visit with Charlene Brooke on 04/27/22 at 2:15. Patient was reminded to have any blood glucose and blood pressure readings available for review at appointment.   Message was left reminding patient of appointment.   CCM referral has been placed prior to visit?  Yes   Star Rating Drugs: Medication:  Last Fill: Day Supply Simvastatin 20 mg 04/18/22 [place], CPP notified  Marijean Niemann, [place] Pharmacy Assistant [phone]
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
## 2022-04-27 NOTE — Telephone Encounter (Signed)
Pt daughter Gerhard Perches called wanted a message sent to Dr Damita Dunnings to discuss pt going to [facility] . Advise her she needed to be added to pt DPR . [phone]

## 2022-04-27 NOTE — Telephone Encounter (Signed)
Daughter called to verify that he filled out from. Verified that we have filled out DPR. He did not put both kids on it. They will have him come by today to fill out another DPR and have both son and daughter put on it

## 2022-04-29 ENCOUNTER — Telehealth: Payer: Self-pay | Admitting: Family Medicine

## 2022-04-29 NOTE — Telephone Encounter (Signed)
Patient daughter Gerhard Perches sent over a medical certification that needs to be completed. Placed in Dr. Damita Dunnings box in the front. Thank you!

## 2022-04-29 NOTE — Telephone Encounter (Signed)
Form placed in Dr. Carole Civil inbox for when he returns next week.
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
## 2022-05-04 ENCOUNTER — Ambulatory Visit: Payer: Medicare Other | Admitting: Cardiovascular Disease

## 2022-05-04 ENCOUNTER — Other Ambulatory Visit: Payer: Self-pay | Admitting: Family Medicine

## 2022-05-04 DIAGNOSIS — Z111 Encounter for screening for respiratory tuberculosis: Secondary | ICD-10-CM

## 2022-05-04 NOTE — Telephone Encounter (Signed)
Patient in need of lab appt for TB test. Left message on sons VM to call back

## 2022-05-04 NOTE — Telephone Encounter (Signed)
Patient daughter Gerhard Perches sent over a message stating:  If you can fax [phone]) the information to [facility] (in care of Chrystie Nose) that would be great, and then let me know. I much appreciate all of your assistance in this matter.  Thank you!

## 2022-05-05 ENCOUNTER — Other Ambulatory Visit (INDEPENDENT_AMBULATORY_CARE_PROVIDER_SITE_OTHER): Payer: Medicare Other

## 2022-05-05 DIAGNOSIS — M109 Gout, unspecified: Secondary | ICD-10-CM

## 2022-05-05 DIAGNOSIS — Z111 Encounter for screening for respiratory tuberculosis: Secondary | ICD-10-CM | POA: Diagnosis not present

## 2022-05-05 LAB — BASIC METABOLIC PANEL
BUN: 30 mg/dL — ABNORMAL HIGH (ref 6–23)
CO2: 29 mEq/L (ref 19–32)
Calcium: 9.2 mg/dL (ref 8.4–10.5)
Chloride: 99 mEq/L (ref 96–112)
Creatinine, Ser: 1.13 mg/dL (ref 0.40–1.50)
GFR: 59.87 mL/min — ABNORMAL LOW (ref >60.00)
Glucose, Bld: 155 mg/dL — ABNORMAL HIGH (ref 70–99)
Potassium: 4.9 mEq/L (ref 3.5–5.1)
Sodium: 137 mEq/L (ref 135–145)

## 2022-05-05 LAB — URIC ACID: Uric Acid, Serum: 5 mg/dL (ref 4.0–7.8)

## 2022-05-06 NOTE — Telephone Encounter (Signed)
error 

## 2022-05-07 ENCOUNTER — Telehealth: Payer: Self-pay | Admitting: Family Medicine

## 2022-05-07 NOTE — Telephone Encounter (Signed)
Patient called in stating that he was suppose to receive a pill pack from Dr. Damita Dunnings but he hasn't received anything at the moment and he is down to a few pills now. Please advise. Thank you!

## 2022-05-07 NOTE — Telephone Encounter (Signed)
Benjamin Soto called regarding medication message below  His home health nurse will call us back in the morning with the list of medication that is needed or his son will call  Benjamin Soto is sending a message to the home health agency to inform them of this

## 2022-05-08 ENCOUNTER — Telehealth: Payer: Self-pay | Admitting: Family Medicine

## 2022-05-08 LAB — QUANTIFERON-TB GOLD PLUS
Mitogen-NIL: 3.31 IU/mL
NIL: 0.01 IU/mL
QuantiFERON-TB Gold Plus: NEGATIVE
TB1-NIL: 0.01 IU/mL
TB2-NIL: 0.01 IU/mL

## 2022-05-08 NOTE — Telephone Encounter (Signed)
Patients new [facility] nurse called in stating that the patient now receives pill packs and they are trying to get them organized ,and would like the following prescriptions refilled: metoprolol succinate (TOPROL-XL) 100 MG 24 hr tablet  latanoprost (XALATAN) 0.005 % ophthalmic solution  Warfarin 27.5 mg   [id number] - [place], [place] - [address]

## 2022-05-08 NOTE — Telephone Encounter (Signed)
Called and spoke with patients caregiver about medications. I let her know as of 04/17/22 patient was taken off warfarin. She also stated that the [facility] nurse is going to be calling patients children to discuss his medications and getting the pill packs done with the pharmacy since they have [place]. She asked that we not send any refills in at this time.

## 2022-05-11 ENCOUNTER — Telehealth: Payer: Self-pay | Admitting: Family Medicine

## 2022-05-11 MED ORDER — ALLOPURINOL 300 MG PO TABS: 300.0000 mg | ORAL_TABLET | Freq: Every day | ORAL | 1 refills | Status: AC

## 2022-05-11 MED ORDER — FUROSEMIDE 20 MG PO TABS: 20.0000 mg | ORAL_TABLET | ORAL | 1 refills | Status: AC

## 2022-05-11 MED ORDER — SIMVASTATIN 20 MG PO TABS: 20.0000 mg | ORAL_TABLET | Freq: Every day | ORAL | 1 refills | Status: AC

## 2022-05-11 MED ORDER — METOPROLOL SUCCINATE ER 100 MG PO TB24: 100.0000 mg | ORAL_TABLET | Freq: Every day | ORAL | 1 refills | Status: AC

## 2022-05-11 NOTE — Telephone Encounter (Signed)
TB test neg and ppw has been finalized. Contacted patients son to notify him ppw was done and ready for pickup. He stated caregiver will come pick up the ppw. I spoke with patients caregiver and she stated that patient is now completely out of his medication since Friday night. [facility] are [facility] going [facility] medications as they will do the pill packs for him. Can you [facility] all [facility]? I see some of them are not from [place].

## 2022-05-11 NOTE — Telephone Encounter (Signed)
I sent 4 rxs.  Thanks.

## 2022-05-11 NOTE — Addendum Note (Signed)
Addended by: Tonia Ghent on: 05/11/2022 02:09 PM   Modules accepted: Orders

## 2022-05-11 NOTE — Telephone Encounter (Signed)
Patient daughter Gerhard Perches called about blister pill packs. Patient daughter call back number [phone].

## 2022-05-12 ENCOUNTER — Telehealth: Payer: Medicare Other

## 2022-05-12 NOTE — Telephone Encounter (Signed)
Spoke with daughter Gerhard Perches, she has already spoken with [facility] and they are working on onboarding patient to start pill packs. Nothing further needed at this time.

## 2022-05-13 ENCOUNTER — Telehealth: Payer: Medicare Other

## 2022-05-13 NOTE — Telephone Encounter (Signed)
Has been done see referral notes

## 2022-05-18 ENCOUNTER — Telehealth: Payer: Self-pay | Admitting: Family Medicine

## 2022-05-19 NOTE — Telephone Encounter (Signed)
Spoke with Alyse Low at [facility] and advised. Chart updated.

## 2022-05-19 NOTE — Telephone Encounter (Addendum)
Please give the order and update his chart.  Thanks.

## 2022-05-19 NOTE — Telephone Encounter (Signed)
[facility] called in today asking for authorization to serve as hospice attending .

## 2022-05-21 DIAGNOSIS — I509 Heart failure, unspecified: Secondary | ICD-10-CM | POA: Diagnosis not present

## 2022-05-21 DIAGNOSIS — I2510 Atherosclerotic heart disease of native coronary artery without angina pectoris: Secondary | ICD-10-CM

## 2022-05-21 DIAGNOSIS — I4891 Unspecified atrial fibrillation: Secondary | ICD-10-CM | POA: Diagnosis not present

## 2022-05-21 DIAGNOSIS — E785 Hyperlipidemia, unspecified: Secondary | ICD-10-CM | POA: Diagnosis not present

## 2022-05-22 NOTE — Telephone Encounter (Signed)
[place] [id number]  Admitted to hospice on 8.27.23 Moved on hospice home on 06/08/2022 Passed away on 2022/06/08 at 4:26am at hospice  Need authorization to serve as hospice attending

## 2022-05-22 DEATH — deceased

## 2022-05-26 ENCOUNTER — Other Ambulatory Visit: Payer: Self-pay | Admitting: Family Medicine

## 2022-05-26 DIAGNOSIS — Z7901 Long term (current) use of anticoagulants: Secondary | ICD-10-CM

## 2022-06-16 ENCOUNTER — Ambulatory Visit: Payer: Medicare Other | Admitting: Cardiovascular Disease

## 2022-07-07 ENCOUNTER — Telehealth: Payer: Medicare Other

## 2022-07-15 ENCOUNTER — Other Ambulatory Visit: Payer: Self-pay | Admitting: Family Medicine
